# Patient Record
Sex: Male | Born: 1955 | Race: White | Hispanic: No | Marital: Married | Smoking: Never smoker
Health system: Southern US, Community
[De-identification: ages and names within clinical notes are randomized; demographics above are authoritative.]

## PROBLEM LIST (undated history)

## (undated) DIAGNOSIS — M1990 Unspecified osteoarthritis, unspecified site: Secondary | ICD-10-CM

## (undated) DIAGNOSIS — E7800 Pure hypercholesterolemia, unspecified: Secondary | ICD-10-CM

## (undated) HISTORY — PX: ROTATOR CUFF REPAIR: SHX139

## (undated) HISTORY — DX: Unspecified osteoarthritis, unspecified site: M19.90

---

## 2002-11-14 ENCOUNTER — Emergency Department (HOSPITAL_COMMUNITY): Admission: EM | Admit: 2002-11-14 | Discharge: 2002-11-14 | Payer: Self-pay | Admitting: Emergency Medicine

## 2006-10-21 ENCOUNTER — Encounter: Admission: RE | Admit: 2006-10-21 | Discharge: 2006-10-21 | Payer: Self-pay | Admitting: *Deleted

## 2016-09-04 ENCOUNTER — Emergency Department (HOSPITAL_BASED_OUTPATIENT_CLINIC_OR_DEPARTMENT_OTHER): Payer: Managed Care, Other (non HMO)

## 2016-09-04 ENCOUNTER — Encounter (HOSPITAL_BASED_OUTPATIENT_CLINIC_OR_DEPARTMENT_OTHER): Payer: Self-pay | Admitting: Emergency Medicine

## 2016-09-04 ENCOUNTER — Emergency Department (HOSPITAL_BASED_OUTPATIENT_CLINIC_OR_DEPARTMENT_OTHER)
Admission: EM | Admit: 2016-09-04 | Discharge: 2016-09-04 | Disposition: A | Discharge status: Home / Self Care | Payer: Managed Care, Other (non HMO) | Attending: Emergency Medicine | Admitting: Emergency Medicine

## 2016-09-04 DIAGNOSIS — R0789 Other chest pain: Secondary | ICD-10-CM

## 2016-09-04 DIAGNOSIS — Z79899 Other long term (current) drug therapy: Secondary | ICD-10-CM | POA: Insufficient documentation

## 2016-09-04 HISTORY — DX: Pure hypercholesterolemia, unspecified: E78.00

## 2016-09-04 LAB — BASIC METABOLIC PANEL
ANION GAP: 9 (ref 5–15)
BUN: 15 mg/dL (ref 6–20)
CALCIUM: 9.1 mg/dL (ref 8.9–10.3)
CO2: 25 mmol/L (ref 22–32)
Chloride: 102 mmol/L (ref 101–111)
Creatinine, Ser: 1.01 mg/dL (ref 0.61–1.24)
GLUCOSE: 136 mg/dL — AB (ref 65–99)
POTASSIUM: 3.6 mmol/L (ref 3.5–5.1)
Sodium: 136 mmol/L (ref 135–145)

## 2016-09-04 LAB — CBC
HEMATOCRIT: 43.8 % (ref 39.0–52.0)
HEMOGLOBIN: 15.2 g/dL (ref 13.0–17.0)
MCH: 31.2 pg (ref 26.0–34.0)
MCHC: 34.7 g/dL (ref 30.0–36.0)
MCV: 89.9 fL (ref 78.0–100.0)
Platelets: 183 10*3/uL (ref 150–400)
RBC: 4.87 MIL/uL (ref 4.22–5.81)
RDW: 13.9 % (ref 11.5–15.5)
WBC: 10.6 10*3/uL — AB (ref 4.0–10.5)

## 2016-09-04 LAB — TROPONIN I

## 2016-09-04 NOTE — ED Triage Notes (Signed)
Intermittent centralized chest pain, non-radiating for over one week.  Some dizziness and bilateral posterior tingling on both arms.  No n/V or diaphoresis.

## 2016-09-04 NOTE — ED Provider Notes (Signed)
[facility] Provider Note   CSN: [id number] Arrival date & time: 09/04/16  1044     History   Chief Complaint Chief Complaint  Patient presents with  . Chest Pain    HPI Justin Cameron is a 61 y.o. male.  61 yo M with a chief complaints of chest pain. Going on for the past week or so. Patient will feel a slight discomfort in his chest and have tingling to bilateral arms. Last for less than a minute. Has happened a total of about 10 times. Happened twice yesterday and once this morning and his wife told him you to come to the ED. He denies shortness of breath diaphoresis nausea or vomiting with this. Denies history of MI. Quit smoking 20 years ago history of hyperlipidemia denies other risk factors. No history of PE or DVT. Patient travels but no further than about 5 or 6 hours at a time. No recent surgeries no hemoptysis no leg swelling. Nothing seems to make this occur. Patient has been exercising without any symptoms.   The history is provided by the patient.  Chest Pain   This is a new problem. The current episode started more than 2 days ago. The problem occurs rarely. The problem has been resolved. The pain is present in the substernal region. The pain is at a severity of 6/10. The pain is moderate. The quality of the pain is described as brief and dull. The pain radiates to the left arm and right arm. Duration of episode(s) is 1 minute. Pertinent negatives include no abdominal pain, no fever, no headaches, no palpitations, no shortness of breath and no vomiting. He has tried nothing for the symptoms. The treatment provided no relief.  His past medical history is significant for hyperlipidemia.  Pertinent negatives for past medical history include no diabetes, no hypertension and no MI.  Pertinent negatives for family medical history include: no early MI.    Past Medical History:  Diagnosis Date  . High cholesterol     There are no active problems to display  for this patient.   Past Surgical History:  Procedure Laterality Date  . ROTATOR CUFF REPAIR         Home Medications    Prior to Admission medications   Medication Sig Start Date End Date Taking? Authorizing Provider  simvastatin (ZOCOR) 40 MG tablet Take 40 mg by mouth daily.   Yes [provider]    Family History No family history on file.  Social History Social History  Substance Use Topics  . Smoking status: Never Smoker  . Smokeless tobacco: Never Used  . Alcohol use Not on file     Allergies   Patient has no known allergies.   Review of Systems Review of Systems  Constitutional: Negative for chills and fever.  HENT: Negative for congestion and facial swelling.   Eyes: Negative for discharge and visual disturbance.  Respiratory: Positive for chest tightness. Negative for shortness of breath.   Cardiovascular: Negative for chest pain and palpitations.  Gastrointestinal: Negative for abdominal pain, diarrhea and vomiting.  Musculoskeletal: Positive for myalgias. Negative for arthralgias.  Skin: Negative for color change and rash.  Neurological: Negative for tremors, syncope and headaches.  Psychiatric/Behavioral: Negative for confusion and dysphoric mood.     Physical Exam Updated Vital Signs BP 123/72 (BP Location: Right Arm)   Pulse 68   Temp 98.5 F (36.9 C) (Oral)   Resp 12   Ht 5\' 8"  (1.727 m)  Wt 209 lb (94.8 kg)   SpO2 97%   BMI 31.78 kg/m   Physical Exam  Constitutional: He is oriented to person, place, and time. He appears well-developed and well-nourished.  HENT:  Head: Normocephalic and atraumatic.  Eyes: EOM are normal. Pupils are equal, round, and reactive to light.  Neck: Normal range of motion. Neck supple. No JVD present.  Cardiovascular: Normal rate and regular rhythm.  Exam reveals no gallop and no friction rub.   No murmur heard. Pulmonary/Chest: No respiratory distress. He has no wheezes. He exhibits no  tenderness.  Abdominal: He exhibits no distension and no mass. There is no tenderness. There is no rebound and no guarding.  Musculoskeletal: Normal range of motion.  Neurological: He is alert and oriented to person, place, and time.  Skin: No rash noted. No pallor.  Psychiatric: He has a normal mood and affect. His behavior is normal.  Nursing note and vitals reviewed.    ED Treatments / Results  Labs (all labs ordered are listed, but only abnormal results are displayed) Labs Reviewed  BASIC METABOLIC PANEL - Abnormal; Notable for the following:       Result Value   Glucose, Bld 136 (*)    All other components within normal limits  CBC - Abnormal; Notable for the following:    WBC 10.6 (*)    All other components within normal limits  TROPONIN I    EKG  EKG Interpretation  Date/Time:  Wednesday Sep 04 2016 10:53:20 EDT Ventricular Rate:  71 PR Interval:    QRS Duration: 100 QT Interval:  395 QTC Calculation: 430 R Axis:   9 Text Interpretation:  Sinus rhythm No old tracing to compare Confirmed by Bobbie Valletta MD, DANIEL [phone]) on 09/04/2016 10:59:35 AM       Radiology Dg Chest 2 View  Result Date: 09/04/2016 CLINICAL DATA:  Chest tightness and numbness in both arms with associated with dizziness for the past 8-9 days. Nonsmoker. History of hypercholesterolemia. EXAM: CHEST  2 VIEW COMPARISON:  None in PACs FINDINGS: The lungs are adequately inflated and clear. The heart and pulmonary vascularity are normal. The mediastinum is normal in width. There is calcification in the wall of the aortic arch. The trachea is midline. There is no pleural effusion. The bony thorax exhibits mild multilevel degenerative disc disease with calcification of the anterior longitudinal ligament. IMPRESSION: There is no pneumonia, CHF, nor other acute cardiopulmonary abnormality. Thoracic aortic atherosclerosis. Electronically Signed   By: David  [place] M.D.   On: 09/04/2016 11:31     Procedures Procedures (including critical care time)  Medications Ordered in ED Medications - No data to display   Initial Impression / Assessment and Plan / ED Course  I have reviewed the triage vital signs and the nursing notes.  Pertinent labs & imaging results that were available during my care of the patient were reviewed by me and considered in my medical decision making (see chart for details).     61 yo M With a chief complaint of episodic chest pain. Completely atypical for ACS. Patient had a troponin that was drawn in triage was negative. EKG and chest x-ray are also unremarkable. With the patient start Zantac follow-up with his family physician.  11:57 AM:  I have discussed the diagnosis/risks/treatment options with the patient and believe the pt to be eligible for discharge home to follow-up with PCP. We also discussed returning to the ED immediately if new or worsening sx occur. We discussed the  sx which are most concerning (e.g., sudden worsening pain, fever, inability to tolerate by mouth) that necessitate immediate return. Medications administered to the patient during their visit and any new prescriptions provided to the patient are listed below.  Medications given during this visit Medications - No data to display   The patient appears reasonably screen and/or stabilized for discharge and I doubt any other medical condition or other [facility] requiring further screening, evaluation, or treatment in the ED at this time prior to discharge.    Final Clinical Impressions(s) / ED Diagnoses   Final diagnoses:  Atypical chest pain    New Prescriptions New Prescriptions   No medications on file     Melene PlanFloyd, Tomoko Sandra, DO 09/04/16 1157

## 2016-09-04 NOTE — Discharge Instructions (Signed)
Try zantac 150mg twice a day.  ° ° °

## 2017-11-29 ENCOUNTER — Ambulatory Visit: Payer: Managed Care, Other (non HMO) | Admitting: Sports Medicine

## 2018-10-28 HISTORY — PX: REPLACEMENT TOTAL KNEE: SUR1224

## 2021-09-11 ENCOUNTER — Ambulatory Visit
Admission: RE | Admit: 2021-09-11 | Discharge: 2021-09-11 | Disposition: A | Discharge status: Home / Self Care | Payer: No Typology Code available for payment source | Source: Ambulatory Visit | Attending: Chiropractic Medicine | Admitting: Chiropractic Medicine

## 2021-09-11 ENCOUNTER — Other Ambulatory Visit: Payer: Self-pay | Admitting: Chiropractic Medicine

## 2021-09-11 DIAGNOSIS — M25662 Stiffness of left knee, not elsewhere classified: Secondary | ICD-10-CM

## 2021-09-11 DIAGNOSIS — M25552 Pain in left hip: Secondary | ICD-10-CM

## 2021-10-04 ENCOUNTER — Other Ambulatory Visit (HOSPITAL_COMMUNITY): Payer: Self-pay | Admitting: Orthopedic Surgery

## 2021-10-04 ENCOUNTER — Other Ambulatory Visit: Payer: Self-pay | Admitting: Orthopedic Surgery

## 2021-10-04 DIAGNOSIS — Z96652 Presence of left artificial knee joint: Secondary | ICD-10-CM

## 2022-01-08 ENCOUNTER — Encounter (HOSPITAL_COMMUNITY)
Admission: RE | Admit: 2022-01-08 | Discharge: 2022-01-08 | Disposition: A | Discharge status: Home / Self Care | Payer: No Typology Code available for payment source | Source: Ambulatory Visit | Attending: Orthopedic Surgery | Admitting: Orthopedic Surgery

## 2022-01-08 ENCOUNTER — Ambulatory Visit (HOSPITAL_COMMUNITY)
Admission: RE | Admit: 2022-01-08 | Discharge: 2022-01-08 | Disposition: A | Discharge status: Home / Self Care | Payer: No Typology Code available for payment source | Source: Ambulatory Visit | Attending: Orthopedic Surgery | Admitting: Orthopedic Surgery

## 2022-01-08 DIAGNOSIS — Z96652 Presence of left artificial knee joint: Secondary | ICD-10-CM | POA: Diagnosis present

## 2022-01-08 MED ORDER — TECHNETIUM TC 99M MEDRONATE IV KIT
20.0000 | PACK | Freq: Once | INTRAVENOUS | Status: AC | PRN
  Administered 2022-01-08: 20 via INTRAVENOUS

## 2022-05-03 DIAGNOSIS — Z471 Aftercare following joint replacement surgery: Secondary | ICD-10-CM | POA: Diagnosis not present

## 2022-05-28 DIAGNOSIS — Z79899 Other long term (current) drug therapy: Secondary | ICD-10-CM | POA: Diagnosis not present

## 2022-05-28 DIAGNOSIS — R7301 Impaired fasting glucose: Secondary | ICD-10-CM | POA: Diagnosis not present

## 2022-05-28 DIAGNOSIS — Z23 Encounter for immunization: Secondary | ICD-10-CM | POA: Diagnosis not present

## 2022-05-28 DIAGNOSIS — Z125 Encounter for screening for malignant neoplasm of prostate: Secondary | ICD-10-CM | POA: Diagnosis not present

## 2022-05-28 DIAGNOSIS — E538 Deficiency of other specified B group vitamins: Secondary | ICD-10-CM | POA: Diagnosis not present

## 2022-05-28 DIAGNOSIS — E782 Mixed hyperlipidemia: Secondary | ICD-10-CM | POA: Diagnosis not present

## 2022-05-28 DIAGNOSIS — Z0000 Encounter for general adult medical examination without abnormal findings: Secondary | ICD-10-CM | POA: Diagnosis not present

## 2022-05-29 ENCOUNTER — Other Ambulatory Visit: Payer: Self-pay | Admitting: Family Medicine

## 2022-05-29 DIAGNOSIS — Z0001 Encounter for general adult medical examination with abnormal findings: Secondary | ICD-10-CM

## 2022-06-25 ENCOUNTER — Ambulatory Visit
Admission: RE | Admit: 2022-06-25 | Discharge: 2022-06-25 | Disposition: A | Discharge status: Home / Self Care | Payer: Medicare Other | Source: Ambulatory Visit | Attending: Family Medicine | Admitting: Family Medicine

## 2022-06-25 DIAGNOSIS — Z0001 Encounter for general adult medical examination with abnormal findings: Secondary | ICD-10-CM

## 2022-06-25 DIAGNOSIS — Z136 Encounter for screening for cardiovascular disorders: Secondary | ICD-10-CM | POA: Diagnosis not present

## 2022-06-25 DIAGNOSIS — Z87891 Personal history of nicotine dependence: Secondary | ICD-10-CM | POA: Diagnosis not present

## 2022-07-17 DIAGNOSIS — M109 Gout, unspecified: Secondary | ICD-10-CM | POA: Diagnosis not present

## 2022-07-17 DIAGNOSIS — R5381 Other malaise: Secondary | ICD-10-CM | POA: Diagnosis not present

## 2022-07-17 DIAGNOSIS — M79672 Pain in left foot: Secondary | ICD-10-CM | POA: Diagnosis not present

## 2022-07-17 DIAGNOSIS — Z6832 Body mass index (BMI) 32.0-32.9, adult: Secondary | ICD-10-CM | POA: Diagnosis not present

## 2022-07-24 ENCOUNTER — Other Ambulatory Visit (HOSPITAL_BASED_OUTPATIENT_CLINIC_OR_DEPARTMENT_OTHER): Payer: Self-pay | Admitting: Family Medicine

## 2022-07-24 DIAGNOSIS — R109 Unspecified abdominal pain: Secondary | ICD-10-CM

## 2022-07-25 ENCOUNTER — Ambulatory Visit (HOSPITAL_COMMUNITY)
Admission: RE | Admit: 2022-07-25 | Discharge: 2022-07-25 | Disposition: A | Discharge status: Home / Self Care | Payer: Medicare Other | Source: Ambulatory Visit | Attending: Family Medicine | Admitting: Family Medicine

## 2022-07-25 DIAGNOSIS — R109 Unspecified abdominal pain: Secondary | ICD-10-CM | POA: Insufficient documentation

## 2022-08-01 ENCOUNTER — Ambulatory Visit: Payer: Medicare Other | Admitting: Podiatry

## 2022-08-01 DIAGNOSIS — M1A0720 Idiopathic chronic gout, left ankle and foot, without tophus (tophi): Secondary | ICD-10-CM

## 2022-08-01 DIAGNOSIS — Q6671 Congenital pes cavus, right foot: Secondary | ICD-10-CM | POA: Diagnosis not present

## 2022-08-01 DIAGNOSIS — M19071 Primary osteoarthritis, right ankle and foot: Secondary | ICD-10-CM | POA: Diagnosis not present

## 2022-08-01 DIAGNOSIS — Q6672 Congenital pes cavus, left foot: Secondary | ICD-10-CM | POA: Diagnosis not present

## 2022-08-01 DIAGNOSIS — M19072 Primary osteoarthritis, left ankle and foot: Secondary | ICD-10-CM

## 2022-08-01 NOTE — Progress Notes (Signed)
  Subjective:  Patient ID: Justin Cameron, male    DOB: 11-09-1955,  MRN: [id number]  Foot exam, history of midfoot pain/ possible gout   67 y.o. male presents with concern for prior pain in the bilateral foot. Also coming in for routine foot exam. Does have history of gout and has had flare ups in past. Had painful flare where both midfoot left worse than right were painful. Pain subsided with ibuprofen.   Past Medical History:  Diagnosis Date   High cholesterol     No Known Allergies  ROS: Negative except as per HPI above  Objective:  General: AAO x3, NAD  Dermatological: With inspection and palpation of the right and left lower extremities there are no open sores, no preulcerative lesions, no rash or signs of infection present. Nails are of normal length thickness and coloration.   Vascular:  Dorsalis Pedis artery and Posterior Tibial artery pedal pulses are 2/4 bilateral.  Capillary fill time < 3 sec to all digits.   Neruologic: Grossly intact via light touch bilateral. Protective threshold intact to all sites bilateral.   Musculoskeletal:  Pes cavus fooot structure noted. There is prominence osseous dorsally at the 1st TMTJ bilteral. No focal areas of pain with palpation..  Gait: Unassisted, Nonantalgic.   No images are attached to the encounter.  Radiographs:  Deferred Assessment:   1. Pes cavus of both feet   2. Arthritis of both midfeet   3. Chronic gout of left foot, unspecified cause      Plan:  Patient was evaluated and treated and all questions answered.  #Pes cavus #midfoot arthritis -Discussed with pt he does have evidence of midfoot arthirtis - Reocmmend topical antiinflammatory pain medication such as voltaren - Pain is only occasional - Recommend over the counter insert for high arch type recommend powerstep or superfeet  #prior gout attacks - monitor for recurrence. No evidence of current flare up -Diet and possible medical management for  high uric acid level  Return in about 1 year (around 08/01/2023) for annual foot exam.          Everitt Amber, [place] [facility]

## 2022-08-02 ENCOUNTER — Ambulatory Visit: Payer: Medicare Other | Admitting: Gastroenterology

## 2022-08-02 ENCOUNTER — Encounter: Payer: Self-pay | Admitting: Gastroenterology

## 2022-08-02 VITALS — BP 124/68 | HR 91 | Ht 68.0 in | Wt 200.5 lb

## 2022-08-02 DIAGNOSIS — R634 Abnormal weight loss: Secondary | ICD-10-CM | POA: Diagnosis not present

## 2022-08-02 DIAGNOSIS — R1084 Generalized abdominal pain: Secondary | ICD-10-CM

## 2022-08-02 MED ORDER — ONDANSETRON HCL 4 MG PO TABS: 4.0000 mg | ORAL_TABLET | Freq: Three times a day (TID) | ORAL | 1 refills | Status: DC | PRN

## 2022-08-02 MED ORDER — DICYCLOMINE HCL 20 MG PO TABS: 20.0000 mg | ORAL_TABLET | Freq: Four times a day (QID) | ORAL | 1 refills | Status: DC

## 2022-08-02 NOTE — Progress Notes (Unsigned)
HPI : Justin Cameron is a very pleasant 67 year old male with a history of high cholesterol who is referred to us by Dr. Gildardo Crankerharles Ross for further evaluation of generalized abdominal pain with weight loss.  The patient states that his pain started about 4 weeks ago.  It is not well localized and seems to involve his entire abdomen, but it sometimes radiates into his lower back.  It never completely goes away and is typically less bothersome in the mornings upon awakening.  He has also noted that the pain has been worse in the early part of the week, and less severe in the latter part of the week.  It it worsened with bending over. It is 6-7/10 at it's worst.  He frequently feels nauseated, but has not vomited.  The pain is worsened with eating, and so he has been eating a bland diet recently, which is better tolerated. He usually has a bowel movement every 1-2 days, but recently he went a whole week without a bowel movement.  He took a laxative (doesn't recall which) and he has been going every 3 days or so since then.  Stools are formed, but not hard.  No blood in the stool. He has lost 12lbs in the past month. He has been under a lot stress in the past few months (mother with dementia, wife with recent major surgery).  He has felt fatigued for the past year which he says his very unusual for him.  His PCP ordered a lot of blood tests which were unremarkable per the patient (results not available for my review). A RUQUS was ordered to evaluate the abdominal pain and this was normal without evidence of gallstones or biliary dilation.  No family history of GI malignancy. He reports having a colonoscopy for colon cancer screening within the past 2 years with [facility] that was normal.    RUQUS Jul 25, 2022 IMPRESSION: No cholelithiasis or sonographic evidence for acute cholecystitis.  Past Medical History:  Diagnosis Date   High cholesterol     Family History  Problem Relation Age of Onset    Atrial fibrillation Mother    Alzheimer's disease Mother    Leukemia Father    Diabetes Father    Social History   Tobacco Use   Smoking status: Never   Smokeless tobacco: Never   Current Outpatient Medications  Medication Sig Dispense Refill   simvastatin (ZOCOR) 40 MG tablet Take 40 mg by mouth daily.     No current facility-administered medications for this visit.   No Known Allergies   Review of Systems: All systems reviewed and negative except where noted in HPI.    US Abdomen Complete  Result Date: 07/25/2022 CLINICAL DATA:  Abdominal pain EXAM: ABDOMEN ULTRASOUND COMPLETE COMPARISON:  Ultrasound abdomen 06/25/2022 FINDINGS: Gallbladder: No gallstones or wall thickening visualized. No sonographic Murphy sign noted by sonographer. Common bile duct: Diameter: 2.4 mm Liver: No focal lesion identified. Within normal limits in parenchymal echogenicity. Portal vein is patent on color Doppler imaging with normal direction of blood flow towards the liver. IVC: No abnormality visualized. Pancreas: Visualized portion unremarkable. Spleen: Size and appearance within normal limits. Right Kidney: Length: 10.1 cm. Echogenicity within normal limits. No mass or hydronephrosis visualized. Left Kidney: Length: 10.1 cm. Echogenicity within normal limits. No mass or hydronephrosis visualized. Abdominal aorta: No aneurysm visualized. Other findings: None. IMPRESSION: No cholelithiasis or sonographic evidence for acute cholecystitis. Electronically Signed   By: Francis Gainesrew  Davis M.D.  On: 07/25/2022 11:55    Physical Exam: Ht 5\' 8"  (1.727 m)   Wt 200 lb 8 oz (90.9 kg)   BMI 30.49 kg/m  Constitutional: Pleasant,well-developed, Caucasian male in no acute distress. HEENT: Normocephalic and atraumatic. Conjunctivae are normal. No scleral icterus. Neck supple.  Cardiovascular: Normal rate, regular rhythm.  Pulmonary/chest: Effort normal and breath sounds normal. No wheezing, rales or  rhonchi. Abdominal: Soft, nondistended, multifocal tenderness to palpation in the epigastrium, LUQ, RUQ and RLQ without rigidity or guarding. Bowel sounds active throughout. There are no masses palpable. No hepatomegaly. Extremities: no edema Neurological: Alert and oriented to person place and time. Skin: Skin is warm and dry. No rashes noted. Psychiatric: Normal mood and affect. Behavior is normal.  CBC    Component Value Date/Time   WBC 10.6 (H) 09/04/2016 1108   RBC 4.87 09/04/2016 1108   HGB 15.2 09/04/2016 1108   HCT 43.8 09/04/2016 1108   PLT 183 09/04/2016 1108   MCV 89.9 09/04/2016 1108   MCH 31.2 09/04/2016 1108   MCHC 34.7 09/04/2016 1108   RDW 13.9 09/04/2016 1108    CMP     Component Value Date/Time   NA 136 09/04/2016 1108   K 3.6 09/04/2016 1108   CL 102 09/04/2016 1108   CO2 25 09/04/2016 1108   GLUCOSE 136 (H) 09/04/2016 1108   BUN 15 09/04/2016 1108   CREATININE 1.01 09/04/2016 1108   CALCIUM 9.1 09/04/2016 1108   GFRNONAA >60 09/04/2016 1108   GFRAA >60 09/04/2016 1108     ASSESSMENT AND PLAN: 67 year old male with 1 month of generalized abdominal pain, poor appetite and 12lb unintentional weight loss.  He has had less frequent stools and has had a lot of stress recently related to caring for his mother and wife.  I suspect his symptoms are most likely secondary to constipation and stress, but given new onset abdominal pain and unintentional weight loss, further evaluation to exclude malignancy is warranted.  Given he had a colonoscopy within 2 years, colon cancer is very unlikely.  Will plan for EGD to exclude gastric cancer/PUD/H. Pylori and CT abdomen/pelvis to exclude pancreatic CA or other intra-abdominal mass. I recommended he try a therapeutic purge prep to clear his colon and see if this would improve his pain and appetite, and then continue to take MiraLax daily. Will prescribed bentyl and zofran to help with pain and nausea.  Abdominal pain,  weight loss - EGD - CT abdomen/pelvis - MiraLax Purge prep, followed by daily MiraLax - Bentyl, zofran PRN for pain and nausea.  The details, risks (including bleeding, perforation, infection, missed lesions, medication reactions and possible hospitalization or surgery if complications occur), benefits, and alternatives to EGD with possible biopsy and possible dilation were discussed with the patient and he consents to proceed.    Henri Guedes E. Tomasa Rand, MD [facility]   CC:  Daisy Floro, MD

## 2022-08-02 NOTE — Patient Instructions (Addendum)
_______________________________________________________  If your blood pressure at your visit was 140/90 or greater, please contact your primary care physician to follow up on this.  Dr Tomasa Rand recommends that you complete a bowel purge (to clean out your bowels). Please do the following: Purchase a bottle of Miralax over the counter as well as a box of 5 mg dulcolax tablets. Take 4 dulcolax tablets. Wait 1 hour. You will then drink 6-8 capfuls of Miralax mixed in an adequate amount of water/juice/gatorade (you may choose which of these liquids to drink) over the next 2-3 hours. You should expect results within 1 to 6 hours after completing the bowel purge.\    If you are age 66 or older, your body mass index should be between 23-30. Your Body mass index is 30.49 kg/m. If this is out of the aforementioned range listed, please consider follow up with your Primary Care Provider.  If you are age 22 or younger, your body mass index should be between 19-25. Your Body mass index is 30.49 kg/m. If this is out of the aformentioned range listed, please consider follow up with your Primary Care Provider.   You have been scheduled for an endoscopy. Please follow written instructions given to you at your visit today. If you use inhalers (even only as needed), please bring them with you on the day of your procedure.   You have been scheduled for a CT scan of the abdomen and pelvis at [facility] 1st floor Radiology. You are scheduled on 08/12/22 at 9:30am. You should arrive 15 minutes prior to your appointment time for registration.  Please follow the written instructions below on the day of your exam:   1) Do not eat anything after 5:30am (4 hours prior to your test)   You may take any medications as prescribed with a small amount of water, if necessary. If you take any of the following medications: METFORMIN, GLUCOPHAGE, GLUCOVANCE, AVANDAMET, RIOMET, FORTAMET, ACTOPLUS MET, JANUMET,  GLUMETZA or METAGLIP, you MAY be asked to HOLD this medication 48 hours AFTER the exam.   If you have any questions regarding your exam or if you need to reschedule, you may call [facility] at [phone] between the hours of 8:00 am and 5:00 pm, Monday-Friday.   The [facility] providers would like to encourage you to use [facility] to communicate with providers for non-urgent requests or questions.  Due to long hold times on the telephone, sending your provider a message by [facility] may be a faster and more efficient way to get a response.  Please allow 48 business hours for a response.  Please remember that this is for non-urgent requests.   It was a pleasure to see you today!  Thank you for trusting me with your gastrointestinal care!    Scott E.Tomasa Rand, MD

## 2022-08-06 ENCOUNTER — Telehealth: Payer: Self-pay | Admitting: Pharmacy Technician

## 2022-08-06 NOTE — Telephone Encounter (Signed)
Patient Advocate Encounter  Received notification from [facility] that prior authorization for DICYCLOMINE 20MG  is required.   PA submitted on 4.9.24 Key [id number] Status is pending

## 2022-08-08 NOTE — Telephone Encounter (Signed)
PA has been APPROVED. Approval letter has been attached in patients documents. 

## 2022-08-12 ENCOUNTER — Encounter (HOSPITAL_BASED_OUTPATIENT_CLINIC_OR_DEPARTMENT_OTHER): Payer: Self-pay

## 2022-08-12 ENCOUNTER — Ambulatory Visit (HOSPITAL_BASED_OUTPATIENT_CLINIC_OR_DEPARTMENT_OTHER)
Admission: RE | Admit: 2022-08-12 | Discharge: 2022-08-12 | Disposition: A | Discharge status: Home / Self Care | Payer: Medicare Other | Source: Ambulatory Visit | Attending: Gastroenterology | Admitting: Gastroenterology

## 2022-08-12 DIAGNOSIS — R1084 Generalized abdominal pain: Secondary | ICD-10-CM | POA: Insufficient documentation

## 2022-08-12 DIAGNOSIS — R634 Abnormal weight loss: Secondary | ICD-10-CM

## 2022-08-12 DIAGNOSIS — K7689 Other specified diseases of liver: Secondary | ICD-10-CM | POA: Diagnosis not present

## 2022-08-12 DIAGNOSIS — K5730 Diverticulosis of large intestine without perforation or abscess without bleeding: Secondary | ICD-10-CM | POA: Diagnosis not present

## 2022-08-12 MED ORDER — IOHEXOL 300 MG/ML  SOLN
100.0000 mL | Freq: Once | INTRAMUSCULAR | Status: AC | PRN
  Administered 2022-08-12: 75 mL via INTRAVENOUS

## 2022-08-13 ENCOUNTER — Other Ambulatory Visit: Payer: Self-pay

## 2022-08-13 DIAGNOSIS — R935 Abnormal findings on diagnostic imaging of other abdominal regions, including retroperitoneum: Secondary | ICD-10-CM

## 2022-08-13 DIAGNOSIS — Z1211 Encounter for screening for malignant neoplasm of colon: Secondary | ICD-10-CM

## 2022-08-13 MED ORDER — NA SULFATE-K SULFATE-MG SULF 17.5-3.13-1.6 GM/177ML PO SOLN: 1.0000 | Freq: Once | ORAL | 0 refills | Status: AC

## 2022-08-13 NOTE — Progress Notes (Signed)
I spoke with the patient on the phone today regarding his CT results, which are very concerning for colon cancer.  He has an EGD scheduled for April 24th.  Based on the CT findings, I recommended we change this procedure to a colonoscopy.   Will also request colonoscopy report again from [facility]. Patient indicated understanding that his CT was very suspicious for colon cancer and that we need to do a colonoscopy.  He will be contacted by our staff to go over pre-procedure instructions for a colonoscopy.  Plan for colonoscopy April 24th.

## 2022-08-14 ENCOUNTER — Encounter: Payer: Self-pay | Admitting: Gastroenterology

## 2022-08-16 ENCOUNTER — Telehealth: Payer: Self-pay

## 2022-08-16 ENCOUNTER — Other Ambulatory Visit: Payer: Self-pay | Admitting: Gastroenterology

## 2022-08-16 NOTE — Telephone Encounter (Signed)
Called patient and went over prep instructions.I let patient know to call and check on prep that was sent to his pharmacy. Patient stated that if he has any questions he would call.

## 2022-08-21 ENCOUNTER — Ambulatory Visit (AMBULATORY_SURGERY_CENTER): Payer: Medicare Other | Admitting: Gastroenterology

## 2022-08-21 ENCOUNTER — Other Ambulatory Visit (INDEPENDENT_AMBULATORY_CARE_PROVIDER_SITE_OTHER): Payer: Medicare Other

## 2022-08-21 ENCOUNTER — Other Ambulatory Visit: Payer: Self-pay

## 2022-08-21 ENCOUNTER — Encounter: Payer: Self-pay | Admitting: Gastroenterology

## 2022-08-21 VITALS — BP 116/72 | HR 92 | Temp 98.6°F | Resp 14 | Ht 68.0 in | Wt 200.0 lb

## 2022-08-21 DIAGNOSIS — R1084 Generalized abdominal pain: Secondary | ICD-10-CM

## 2022-08-21 DIAGNOSIS — R933 Abnormal findings on diagnostic imaging of other parts of digestive tract: Secondary | ICD-10-CM | POA: Diagnosis not present

## 2022-08-21 DIAGNOSIS — R935 Abnormal findings on diagnostic imaging of other abdominal regions, including retroperitoneum: Secondary | ICD-10-CM | POA: Diagnosis not present

## 2022-08-21 DIAGNOSIS — K633 Ulcer of intestine: Secondary | ICD-10-CM

## 2022-08-21 DIAGNOSIS — Z1211 Encounter for screening for malignant neoplasm of colon: Secondary | ICD-10-CM

## 2022-08-21 DIAGNOSIS — K6389 Other specified diseases of intestine: Secondary | ICD-10-CM

## 2022-08-21 LAB — COMPREHENSIVE METABOLIC PANEL
ALT: 8 U/L (ref 0–53)
AST: 16 U/L (ref 0–37)
Albumin: 3.6 g/dL (ref 3.5–5.2)
Alkaline Phosphatase: 87 U/L (ref 39–117)
BUN: 9 mg/dL (ref 6–23)
CO2: 28 mEq/L (ref 19–32)
Calcium: 9 mg/dL (ref 8.4–10.5)
Chloride: 99 mEq/L (ref 96–112)
Creatinine, Ser: 0.99 mg/dL (ref 0.40–1.50)
GFR: 79.73 mL/min (ref >60.00)
Glucose, Bld: 102 mg/dL — ABNORMAL HIGH (ref 70–99)
Potassium: 4.9 mEq/L (ref 3.5–5.1)
Sodium: 137 mEq/L (ref 135–145)
Total Bilirubin: 0.9 mg/dL (ref 0.2–1.2)
Total Protein: 6.9 g/dL (ref 6.0–8.3)

## 2022-08-21 LAB — CBC WITH DIFFERENTIAL/PLATELET
Basophils Absolute: 0.1 10*3/uL (ref 0.0–0.1)
Basophils Relative: 0.9 % (ref 0.0–3.0)
Eosinophils Absolute: 0.2 10*3/uL (ref 0.0–0.7)
Eosinophils Relative: 2.3 % (ref 0.0–5.0)
HCT: 40.6 % (ref 39.0–52.0)
Hemoglobin: 13.4 g/dL (ref 13.0–17.0)
Lymphocytes Relative: 11.3 % — ABNORMAL LOW (ref 12.0–46.0)
Lymphs Abs: 1.2 10*3/uL (ref 0.7–4.0)
MCHC: 33 g/dL (ref 30.0–36.0)
MCV: 80 fl (ref 78.0–100.0)
Monocytes Absolute: 1 10*3/uL (ref 0.1–1.0)
Monocytes Relative: 8.8 % (ref 3.0–12.0)
Neutro Abs: 8.4 10*3/uL — ABNORMAL HIGH (ref 1.4–7.7)
Neutrophils Relative %: 76.7 % (ref 43.0–77.0)
Platelets: 340 10*3/uL (ref 150.0–400.0)
RBC: 5.08 Mil/uL (ref 4.22–5.81)
RDW: 15.1 % (ref 11.5–15.5)
WBC: 11 10*3/uL — ABNORMAL HIGH (ref 4.0–10.5)

## 2022-08-21 MED ORDER — SODIUM CHLORIDE 0.9 % IV SOLN
500.0000 mL | Freq: Once | INTRAVENOUS | Status: DC
Start: 2022-08-21 — End: 2022-08-21

## 2022-08-21 NOTE — Patient Instructions (Addendum)
Labs today.  Perform CT scan of the chest with contrast at appointment to be scheduled.  Referral placed for colo-rectal surgeon and oncologist, appointments will be scheduled.   YOU HAD AN ENDOSCOPIC PROCEDURE TODAY AT THE [facility]:   Refer to the procedure report that was given to you for any specific questions about what was found during the examination.  If the procedure report does not answer your questions, please call your gastroenterologist to clarify.  If you requested that your care partner not be given the details of your procedure findings, then the procedure report has been included in a sealed envelope for you to review at your convenience later.  YOU SHOULD EXPECT: Some feelings of bloating in the abdomen. Passage of more gas than usual.  Walking can help get rid of the air that was put into your GI tract during the procedure and reduce the bloating. If you had a lower endoscopy (such as a colonoscopy or flexible sigmoidoscopy) you may notice spotting of blood in your stool or on the toilet paper. If you underwent a bowel prep for your procedure, you may not have a normal bowel movement for a few days.  Please Note:  You might notice some irritation and congestion in your nose or some drainage.  This is from the oxygen used during your procedure.  There is no need for concern and it should clear up in a day or so.  SYMPTOMS TO REPORT IMMEDIATELY:  Following lower endoscopy (colonoscopy or flexible sigmoidoscopy):  Excessive amounts of blood in the stool  Significant tenderness or worsening of abdominal pains  Swelling of the abdomen that is new, acute  Fever of 100F or higher  For urgent or emergent issues, a gastroenterologist can be reached at any hour by calling [phone]. Do not use MyChart messaging for urgent concerns.    DIET:  We do recommend a small meal at first, but then you may proceed to your regular diet.  Drink plenty of fluids but you  should avoid alcoholic beverages for 24 hours.  ACTIVITY:  You should plan to take it easy for the rest of today and you should NOT DRIVE or use heavy machinery until tomorrow (because of the sedation medicines used during the test).    FOLLOW UP: Our staff will call the number listed on your records the next business day following your procedure.  We will call around 7:15- 8:00 am to check on you and address any questions or concerns that you may have regarding the information given to you following your procedure. If we do not reach you, we will leave a message.     If any biopsies were taken you will be contacted by phone or by letter within the next 1-3 weeks.  Please call us at [phone] if you have not heard about the biopsies in 3 weeks.    SIGNATURES/CONFIDENTIALITY: You and/or your care partner have signed paperwork which will be entered into your electronic medical record.  These signatures attest to the fact that that the information above on your After Visit Summary has been reviewed and is understood.  Full responsibility of the confidentiality of this discharge information lies with you and/or your care-partner.

## 2022-08-21 NOTE — Progress Notes (Signed)
Pt's states no medical or surgical changes since previsit or office visit. 

## 2022-08-21 NOTE — Progress Notes (Signed)
History and Physical Interval Note:  08/21/2022 10:35 AM  Justin Cameron  has presented today for endoscopic procedure(s), with the diagnosis of  Encounter Diagnoses  Name Primary?   Generalized abdominal pain Yes   Screening for colon cancer   .  The various methods of evaluation and treatment have been discussed with the patient and/or family. After consideration of risks, benefits and other options for treatment, the patient has consented to  the endoscopic procedure(s).   The patient's history has been reviewed, patient examined, no change in status, stable for endoscopic procedure(s).  I have reviewed the patient's chart and labs.  Questions were answered to the patient's satisfaction.     Kagan Hietpas E. Tomasa Rand, MD [facility]

## 2022-08-21 NOTE — Progress Notes (Signed)
Called to room to assist during endoscopic procedure.  Patient ID and intended procedure confirmed with present staff. Received instructions for my participation in the procedure from the performing physician.  

## 2022-08-21 NOTE — Op Note (Signed)
[facility] Patient Name: Justin Cameron Procedure Date: 08/21/2022 10:35 AM MRN: [id number] Endoscopist: Lorin Picket E. Tomasa Rand , MD, [id number] Age: 67 Referring MD:  Date of Birth: 12/04/1955 Gender: Male Account #: [id number] Procedure:                Colonoscopy Indications:              Generalized abdominal pain, Abnormal CT of the GI                            tract, Weight loss Medicines:                Monitored Anesthesia Care Procedure:                Pre-Anesthesia Assessment:                           - Prior to the procedure, a History and Physical                            was performed, and patient medications and                            allergies were reviewed. The patient's tolerance of                            previous anesthesia was also reviewed. The risks                            and benefits of the procedure and the sedation                            options and risks were discussed with the patient.                            All questions were answered, and informed consent                            was obtained. Prior Anticoagulants: The patient has                            taken no anticoagulant or antiplatelet agents. ASA                            Grade Assessment: II - A patient with mild systemic                            disease. After reviewing the risks and benefits,                            the patient was deemed in satisfactory condition to                            undergo the procedure.  After obtaining informed consent, the colonoscope                            was passed under direct vision. Throughout the                            procedure, the patient's blood pressure, pulse, and                            oxygen saturations were monitored continuously. The                            Olympus CF-HQ190L [id number]) Colonoscope was                            introduced through the anus and  advanced to the the                            cecum, identified by appendiceal orifice and                            ileocecal valve. The colonoscopy was performed                            without difficulty. The patient tolerated the                            procedure well. The quality of the bowel                            preparation was adequate. The ileocecal valve,                            appendiceal orifice, and rectum were photographed.                            The bowel preparation used was SUPREP via split                            dose instruction. Scope In: 10:54:44 AM Scope Out: 11:15:46 AM Scope Withdrawal Time: 0 hours 12 minutes 55 seconds  Total Procedure Duration: 0 hours 21 minutes 2 seconds  Findings:                 The perianal and digital rectal examinations were                            normal. Pertinent negatives include normal                            sphincter tone and no palpable rectal lesions.                           A very large, ulcerated partially obstructing large  mass was found at the hepatic flexure. The mass was                            circumferential and the area involving the middle                            portion of the mass was excavated, with no                            discernable normal mucosa. There was adherent stool                            covering much of the lesion that would not rinse                            well with water lavage. There appeared to be areas                            of fistulization/tracts which were not probed or                            explored. The mass measured seven cm in length. No                            bleeding was present. Biopsies were taken with a                            cold forceps for histology. The lesion was friable.                            In some areas the lesion was very hard, while in                            others it was  quite friable. Estimated blood loss                            was minimal. Area was tattooed with an injection of                            3 mL of Spot (carbon black) 2-3 distal to the mass.                            Estimated blood loss was minimal.                           Multiple medium-mouthed and small-mouthed                            diverticula were found in the sigmoid colon and                            descending colon. There was  no evidence of                            diverticular bleeding.                           The exam was otherwise normal throughout the                            examined colon.                           The retroflexed view of the distal rectum and anal                            verge was normal and showed no anal or rectal                            abnormalities. Complications:            No immediate complications. Estimated Blood Loss:     Estimated blood loss was minimal. 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 Biopsied.                            Tattooed.                           - Moderate diverticulosis in the sigmoid colon and                            in the descending colon. There was no evidence of                            diverticular bleeding.                           - The GI Genius (intelligent endoscopy module),                            computer-aided polyp detection system powered by AI                            was utilized to detect colorectal polyps through                            enhanced visualization during colonoscopy. Recommendation:           - Patient has a contact number available for  emergencies. The signs and symptoms of potential                            delayed complications were discussed with  the                            patient. Return to normal activities tomorrow.                            Written discharge instructions were provided to the                            patient.                           - Resume previous diet.                           - Continue present medications.                           - Await pathology results.                           - Refer to a colo-rectal surgeon at appointment to                            be scheduled.                           - Refer to an oncologist at appointment to be                            scheduled.                           - Perform CT scan (computed tomography) of the                            chest with contrast at appointment to be scheduled.                           - Check CBC, CMP, CEA. Anisah Kuck E. Tomasa Rand, MD 08/21/2022 11:30:36 AM This report has been signed electronically.

## 2022-08-21 NOTE — Progress Notes (Signed)
Report to PACU, RN, vss, BBS= Clear.  

## 2022-08-22 ENCOUNTER — Encounter: Payer: Self-pay | Admitting: *Deleted

## 2022-08-22 ENCOUNTER — Other Ambulatory Visit: Payer: Self-pay

## 2022-08-22 ENCOUNTER — Ambulatory Visit (HOSPITAL_COMMUNITY)
Admission: RE | Admit: 2022-08-22 | Discharge: 2022-08-22 | Disposition: A | Discharge status: Home / Self Care | Payer: Medicare Other | Source: Ambulatory Visit | Attending: Gastroenterology | Admitting: Gastroenterology

## 2022-08-22 ENCOUNTER — Telehealth: Payer: Self-pay

## 2022-08-22 DIAGNOSIS — I700 Atherosclerosis of aorta: Secondary | ICD-10-CM | POA: Insufficient documentation

## 2022-08-22 DIAGNOSIS — K6389 Other specified diseases of intestine: Secondary | ICD-10-CM

## 2022-08-22 DIAGNOSIS — K7689 Other specified diseases of liver: Secondary | ICD-10-CM | POA: Diagnosis not present

## 2022-08-22 DIAGNOSIS — J8410 Pulmonary fibrosis, unspecified: Secondary | ICD-10-CM | POA: Insufficient documentation

## 2022-08-22 DIAGNOSIS — M47814 Spondylosis without myelopathy or radiculopathy, thoracic region: Secondary | ICD-10-CM | POA: Insufficient documentation

## 2022-08-22 DIAGNOSIS — R918 Other nonspecific abnormal finding of lung field: Secondary | ICD-10-CM | POA: Diagnosis not present

## 2022-08-22 DIAGNOSIS — C189 Malignant neoplasm of colon, unspecified: Secondary | ICD-10-CM | POA: Diagnosis not present

## 2022-08-22 LAB — CEA: CEA: <2 ng/mL

## 2022-08-22 MED ORDER — IOHEXOL 300 MG/ML  SOLN
75.0000 mL | Freq: Once | INTRAMUSCULAR | Status: AC | PRN
  Administered 2022-08-22: 100 mL via INTRAVENOUS

## 2022-08-22 NOTE — Telephone Encounter (Signed)
Left VM for [facility] medical records explaining that we have requested medial records multiple times. Patient has contacted [facility] and have even went to there office to pick up records and they have not received them.

## 2022-08-22 NOTE — Progress Notes (Signed)
PATIENT NAVIGATOR PROGRESS NOTE  Name: Justin Cameron Date: 08/22/2022 MRN: [name]   Reason for visit:  New Patient appt  Comments:  Called and spoke with pt and introduced myself as Nurse Navigator and gave contact information He is having staging CT Chest today at 1400 at [facility] Awaiting pathology results from colonoscopy from 4/24 He is waiting for call from [facility] for appt with surgeon.  Will continue to monitor and encouraged patient to call with any questions    Time spent counseling/coordinating care: 45-60 minutes

## 2022-08-22 NOTE — Addendum Note (Signed)
Addended by: Justice Britain on: 08/22/2022 10:14 AM   Modules accepted: Orders

## 2022-08-22 NOTE — Telephone Encounter (Signed)
Post procedure follow up call, left message 

## 2022-08-22 NOTE — Telephone Encounter (Signed)
Patient brought records to office 08/22/22.

## 2022-08-27 NOTE — Telephone Encounter (Signed)
Records have been scanned into Media

## 2022-08-28 ENCOUNTER — Encounter: Payer: Self-pay | Admitting: Gastroenterology

## 2022-08-30 ENCOUNTER — Encounter: Payer: Self-pay | Admitting: Gastroenterology

## 2022-09-04 ENCOUNTER — Encounter: Payer: Self-pay | Admitting: Gastroenterology

## 2022-09-04 ENCOUNTER — Other Ambulatory Visit: Payer: Self-pay

## 2022-09-04 DIAGNOSIS — R935 Abnormal findings on diagnostic imaging of other abdominal regions, including retroperitoneum: Secondary | ICD-10-CM

## 2022-09-04 DIAGNOSIS — K6389 Other specified diseases of intestine: Secondary | ICD-10-CM

## 2022-09-04 NOTE — Progress Notes (Signed)
The proposed treatment discussed in conference is for discussion purpose only and is not a binding recommendation.  The patients have not been physically examined, or presented with their treatment options.  Therefore, final treatment plans cannot be decided.  

## 2022-09-05 ENCOUNTER — Ambulatory Visit (AMBULATORY_SURGERY_CENTER): Payer: Medicare Other | Admitting: Gastroenterology

## 2022-09-05 ENCOUNTER — Encounter: Payer: Self-pay | Admitting: Gastroenterology

## 2022-09-05 VITALS — BP 116/67 | HR 98 | Temp 98.6°F | Resp 16 | Ht 68.0 in | Wt 200.0 lb

## 2022-09-05 DIAGNOSIS — D123 Benign neoplasm of transverse colon: Secondary | ICD-10-CM

## 2022-09-05 DIAGNOSIS — K6389 Other specified diseases of intestine: Secondary | ICD-10-CM | POA: Diagnosis not present

## 2022-09-05 DIAGNOSIS — R1084 Generalized abdominal pain: Secondary | ICD-10-CM | POA: Diagnosis not present

## 2022-09-05 DIAGNOSIS — R933 Abnormal findings on diagnostic imaging of other parts of digestive tract: Secondary | ICD-10-CM | POA: Diagnosis not present

## 2022-09-05 DIAGNOSIS — K5669 Other partial intestinal obstruction: Secondary | ICD-10-CM

## 2022-09-05 MED ORDER — SODIUM CHLORIDE 0.9 % IV SOLN
500.0000 mL | Freq: Once | INTRAVENOUS | Status: DC
Start: 2022-09-05 — End: 2022-09-05

## 2022-09-05 NOTE — Op Note (Signed)
[facility] Patient Name: Justin Cameron Procedure Date: 09/05/2022 3:46 PM MRN: [id number] Endoscopist: Lorin Picket E. Tomasa Cameron , MD, [id number] Age: 67 Referring MD:  Date of Birth: 1955-05-26 Gender: Male Account #: [id number] Procedure:                Colonoscopy Indications:              Suspected colon cancer Medicines:                Monitored Anesthesia Care Procedure:                Pre-Anesthesia Assessment:                           - Prior to the procedure, a History and Physical                            was performed, and patient medications and                            allergies were reviewed. The patient's tolerance of                            previous anesthesia was also reviewed. The risks                            and benefits of the procedure and the sedation                            options and risks were discussed with the patient.                            All questions were answered, and informed consent                            was obtained. Prior Anticoagulants: The patient has                            taken no anticoagulant or antiplatelet agents. ASA                            Grade Assessment: III - A patient with severe                            systemic disease. After reviewing the risks and                            benefits, the patient was deemed in satisfactory                            condition to undergo the procedure.                           After obtaining informed consent, the colonoscope  was passed under direct vision. Throughout the                            procedure, the patient's blood pressure, pulse, and                            oxygen saturations were monitored continuously. The                            CF HQ190L #[id number] was introduced through the anus                            and advanced to the the cecum, identified by                            appendiceal orifice and  ileocecal valve. The                            colonoscopy was performed without difficulty. The                            patient tolerated the procedure well. The quality                            of the bowel preparation was adequate. The                            ileocecal valve, appendiceal orifice, and rectum                            were photographed. Scope In: 4:06:56 PM Scope Out: 4:30:01 PM Scope Withdrawal Time: 0 hours 15 minutes 49 seconds  Total Procedure Duration: 0 hours 23 minutes 5 seconds  Findings:                 The perianal and digital rectal examinations were                            normal. Pertinent negatives include normal                            sphincter tone and no palpable rectal lesions.                           An ulcerated partially obstructing large mass was                            again found at the hepatic flexure. The mass was                            circumferential. The mass measured about 7-8 cm in                            length. No bleeding was present although there  appeared to be old clots. There was copious                            adherent stool/debris which interfered with                            visualization and was not amenable to                            lavage/suction, but the underlying mucosa was                            better visualized this time compared to last time.                            This was biopsied with a cold forceps for                            histology. Estimated blood loss was minimal. Twenty                            biopsies were taken from the proximal margin, the                            visible mucosa just distal to the proximal margin,                            some of the ulcerated areas within the lesion, and                            the distal margin of the mass. Once again the                            lesion appeared cavitary with portions  of the                            lesion suspicion for fistula formation                           The exam was otherwise normal throughout the                            examined colon.                           The retroflexed view of the distal rectum and anal                            verge was normal and showed no anal or rectal                            abnormalities. Complications:            No immediate complications. Estimated Blood Loss:  Estimated blood loss was minimal. 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 Biopsied.                           - The distal rectum and anal verge are normal on                            retroflexion view. Recommendation:           - Patient has a contact number available for                            emergencies. The signs and symptoms of potential                            delayed complications were discussed with the                            patient. Return to normal activities tomorrow.                            Written discharge instructions were provided to the                            patient.                           - Resume previous diet.                           - Continue present medications.                           - Await pathology results.                           - Follow up with Dr. Maisie Fus to discuss surgery. Justin Rebert E. Tomasa Rand, MD 09/05/2022 4:52:32 PM This report has been signed electronically.

## 2022-09-05 NOTE — Op Note (Signed)
[facility] Patient Name: Justin Cameron Procedure Date: 09/05/2022 3:46 PM MRN: [id number] Endoscopist: Lorin Picket E. Tomasa Rand , MD, [id number] Age: 67 Referring MD:  Date of Birth: 1955/05/09 Gender: Male Account #: [id number] Procedure:                Upper GI endoscopy Indications:              Abnormal CT of the GI tract; colonic mass with                            close approximation to duodenum; EGD to exclude                            duodenal involvement prior to surgery Medicines:                Monitored Anesthesia Care Procedure:                Pre-Anesthesia Assessment:                           - Prior to the procedure, a History and Physical                            was performed, and patient medications and                            allergies were reviewed. The patient's tolerance of                            previous anesthesia was also reviewed. The risks                            and benefits of the procedure and the sedation                            options and risks were discussed with the patient.                            All questions were answered, and informed consent                            was obtained. Prior Anticoagulants: The patient has                            taken no anticoagulant or antiplatelet agents. ASA                            Grade Assessment: III - A patient with severe                            systemic disease. After reviewing the risks and                            benefits, the patient was deemed in satisfactory  condition to undergo the procedure.                           After obtaining informed consent, the endoscope was                            passed under direct vision. Throughout the                            procedure, the patient's blood pressure, pulse, and                            oxygen saturations were monitored continuously. The                            GIF HQ190  #[id number] was introduced through the                            mouth, and advanced to the third part of duodenum.                            The upper GI endoscopy was accomplished without                            difficulty. The patient tolerated the procedure                            well. Scope In: Scope Out: Findings:                 The examined esophagus was normal.                           The entire examined stomach was normal.                           Localized mucosal abnormality (slightly raised with                            linear erythema) was found in the third portion of                            the duodenum. The abnormality was about 1 cm in                            size. Biopsies were taken with a cold forceps for                            histology. Estimated blood loss was minimal.                           The exam of the duodenum was otherwise normal. Complications:            No immediate complications. Estimated Blood Loss:     Estimated blood loss was minimal. Impression:               -  Normal esophagus.                           - Normal stomach.                           - Localized mucosal abnormality in the third                            portion of the duodenum. Biopsied.                           - No evidence of luminal obstruction from the                            colonic mass, or disruption of the duodenal mucosa. Recommendation:           - Patient has a contact number available for                            emergencies. The signs and symptoms of potential                            delayed complications were discussed with the                            patient. Return to normal activities tomorrow.                            Written discharge instructions were provided to the                            patient.                           - Resume previous diet.                           - Continue present medications.                            - Await pathology results.                           - Follow up with Dr. Maisie Fus. [name], MD 09/05/2022 4:39:14 PM This report has been signed electronically.

## 2022-09-05 NOTE — Progress Notes (Signed)
[facility] History and Physical   Primary Care Physician:  Daisy Floro, MD   Reason for Procedure:   Repeat colonoscopy due to indeterminate colon biopsies; EGD to exclude duodenal involvement of colonic mass  Plan:    EGD/colonoscopy     HPI: AUNDREY EHRHARDT is a 67 y.o. male undergoing EGD and colonoscopy to evaluate large ulcerated colonic lesion biopsied April 24th with indeterminate histology (ulcerated mucosa).  High suspicion for malignancy based on appearance.  Case discussed at multidisciplinary conference yesterday with recommendations to repeat colonoscopy with biopsies and perform EGD to exclude duodenal involvement, based on CT showing close proximity of lesion to duodenum.  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 Authorizing Provider  dicyclomine (BENTYL) 20 MG tablet TAKE 1 TABLET(20 MG) BY MOUTH EVERY 6 HOURS 08/19/22  Yes Jenel Lucks, MD  diphenhydramine-acetaminophen (TYLENOL PM) 25-500 MG TABS tablet Take 1 tablet by mouth at bedtime.   Yes [provider]  ondansetron (ZOFRAN) 4 MG tablet TAKE 1 TABLET(4 MG) BY MOUTH EVERY 8 HOURS AS NEEDED FOR NAUSEA OR VOMITING 08/19/22  Yes Jenel Lucks, MD  simvastatin (ZOCOR) 20 MG tablet Take 20 mg by mouth.   Yes [provider]  traZODone (DESYREL) 50 MG tablet Take 50-100 mg by mouth at bedtime as needed. 07/26/22  Yes [provider]  ALPRAZolam (XANAX) 0.25 MG tablet Take 0.125-0.25 mg by mouth 3 (three) times daily.    [provider]  colchicine 0.6 MG tablet Take 0.6 mg by mouth daily. 07/19/22   [provider]    Current Outpatient Medications  Medication Sig Dispense Refill   dicyclomine (BENTYL) 20 MG tablet  TAKE 1 TABLET(20 MG) BY MOUTH EVERY 6 HOURS 90 tablet 3   diphenhydramine-acetaminophen (TYLENOL PM) 25-500 MG TABS tablet Take 1 tablet by mouth at bedtime.     ondansetron (ZOFRAN) 4 MG tablet TAKE 1 TABLET(4 MG) BY MOUTH EVERY 8 HOURS AS NEEDED FOR NAUSEA OR VOMITING 90 tablet 3   simvastatin (ZOCOR) 20 MG tablet Take 20 mg by mouth.     traZODone (DESYREL) 50 MG tablet Take 50-100 mg by mouth at bedtime as needed.     ALPRAZolam (XANAX) 0.25 MG tablet Take 0.125-0.25 mg by mouth 3 (three) times daily.     colchicine 0.6 MG tablet Take 0.6 mg by mouth daily.     Current Facility-Administered Medications  Medication Dose Route Frequency Provider Last Rate Last Admin   0.9 %  sodium chloride infusion  500 mL Intravenous Once Jenel Lucks, MD        Allergies as of 09/05/2022   (No Known Allergies)    Family History  Problem Relation Age of Onset   Atrial fibrillation Mother    Alzheimer's disease Mother    Leukemia Father    Diabetes Father    Colon cancer Neg Hx    Rectal cancer Neg Hx    Esophageal cancer Neg Hx    Stomach cancer Neg Hx     Social History   Socioeconomic History   Marital status: Married    Spouse name: Not on file   Number of children: Not on file   Years of education:  Not on file   Highest education level: Not on file  Occupational History   Not on file  Tobacco Use   Smoking status: Never   Smokeless tobacco: Never  Vaping Use   Vaping Use: Never used  Substance and Sexual Activity   Alcohol use: Not Currently   Drug use: Not Currently   Sexual activity: Not on file  Other Topics Concern   Not on file  Social History Narrative   Not on file   Social Determinants of Health   Financial Resource Strain: Not on file  Food Insecurity: Not on file  Transportation Needs: Not on file  Physical Activity: Not on file  Stress: Not on file  Social Connections: Not on file  Intimate Partner Violence: Not on file    Review of  Systems:  All other review of systems negative except as mentioned in the HPI.  Physical Exam: Vital signs BP 130/72   Pulse (!) 106   Temp 98.6 F (37 C) (Temporal)   Resp 13   Ht 5\' 8"  (1.727 m)   Wt 200 lb (90.7 kg)   SpO2 92%   BMI 30.41 kg/m   General:   Alert,  Well-developed, well-nourished, pleasant and cooperative in NAD Airway:  Mallampati 1 Lungs:  Clear throughout to auscultation.   Heart:  Regular rate and rhythm; no murmurs, clicks, rubs,  or gallops. Abdomen:  Soft, nontender and nondistended. Normal bowel sounds.   Neuro/Psych:  Normal mood and affect. A and O x 3   Ralpheal Zappone E. Tomasa Rand, MD [facility]

## 2022-09-05 NOTE — Progress Notes (Signed)
Report to PACU, RN, vss, BBS= Clear.  

## 2022-09-05 NOTE — Progress Notes (Signed)
Called to room to assist during endoscopic procedure.  Patient ID and intended procedure confirmed with present staff. Received instructions for my participation in the procedure from the performing physician.  

## 2022-09-05 NOTE — Patient Instructions (Addendum)
-   Follow up with Dr. Maisie Fus to discuss surgery.   Resume previous diet and medications.  Await results for final recommendations.     YOU HAD AN ENDOSCOPIC PROCEDURE TODAY AT THE [facility]:   Refer to the procedure report that was given to you for any specific questions about what was found during the examination.  If the procedure report does not answer your questions, please call your gastroenterologist to clarify.  If you requested that your care partner not be given the details of your procedure findings, then the procedure report has been included in a sealed envelope for you to review at your convenience later.  YOU SHOULD EXPECT: Some feelings of bloating in the abdomen. Passage of more gas than usual.  Walking can help get rid of the air that was put into your GI tract during the procedure and reduce the bloating. If you had a lower endoscopy (such as a colonoscopy or flexible sigmoidoscopy) you may notice spotting of blood in your stool or on the toilet paper. If you underwent a bowel prep for your procedure, you may not have a normal bowel movement for a few days.  Please Note:  You might notice some irritation and congestion in your nose or some drainage.  This is from the oxygen used during your procedure.  There is no need for concern and it should clear up in a day or so.  SYMPTOMS TO REPORT IMMEDIATELY:  Following lower endoscopy (colonoscopy or flexible sigmoidoscopy):  Excessive amounts of blood in the stool  Significant tenderness or worsening of abdominal pains  Swelling of the abdomen that is new, acute  Fever of 100F or higher  Following upper endoscopy (EGD)  Vomiting of blood or coffee ground material  New chest pain or pain under the shoulder blades  Painful or persistently difficult swallowing  New shortness of breath  Fever of 100F or higher  Black, tarry-looking stools  For urgent or emergent issues, a gastroenterologist can be reached at any  hour by calling [phone]. Do not use MyChart messaging for urgent concerns.    DIET:  We do recommend a small meal at first, but then you may proceed to your regular diet.  Drink plenty of fluids but you should avoid alcoholic beverages for 24 hours.  ACTIVITY:  You should plan to take it easy for the rest of today and you should NOT DRIVE or use heavy machinery until tomorrow (because of the sedation medicines used during the test).    FOLLOW UP: Our staff will call the number listed on your records the next business day following your procedure.  We will call around 7:15- 8:00 am to check on you and address any questions or concerns that you may have regarding the information given to you following your procedure. If we do not reach you, we will leave a message.     If any biopsies were taken you will be contacted by phone or by letter within the next 1-3 weeks.  Please call us at [phone] if you have not heard about the biopsies in 3 weeks.    SIGNATURES/CONFIDENTIALITY: You and/or your care partner have signed paperwork which will be entered into your electronic medical record.  These signatures attest to the fact that that the information above on your After Visit Summary has been reviewed and is understood.  Full responsibility of the confidentiality of this discharge information lies with you and/or your care-partner.

## 2022-09-05 NOTE — Progress Notes (Signed)
Pt's states no medical or surgical changes since previsit or office visit. 

## 2022-09-06 ENCOUNTER — Telehealth: Payer: Self-pay

## 2022-09-06 NOTE — Telephone Encounter (Signed)
  Follow up Call-     09/05/2022    2:51 PM 08/21/2022   10:31 AM  Call back number  Post procedure Call Back phone  # [phone]  Permission to leave phone message Yes Yes     Patient questions:  Do you have a fever, pain , or abdominal swelling? No. Pain Score  0 *  Have you tolerated food without any problems? Yes.    Have you been able to return to your normal activities? Yes.    Do you have any questions about your discharge instructions: Diet   No. Medications  No. Follow up visit  No.  Do you have questions or concerns about your Care? No.  Actions: * If pain score is 4 or above: No action needed, pain <4.

## 2022-09-07 ENCOUNTER — Other Ambulatory Visit (HOSPITAL_BASED_OUTPATIENT_CLINIC_OR_DEPARTMENT_OTHER): Payer: Medicare Other

## 2022-09-11 ENCOUNTER — Encounter: Payer: Self-pay | Admitting: Gastroenterology

## 2022-09-12 NOTE — Telephone Encounter (Signed)
[facility] pathology called, results should be back later this afternoon. Patient notified.

## 2022-09-14 ENCOUNTER — Telehealth: Payer: Self-pay | Admitting: Gastroenterology

## 2022-09-14 NOTE — Telephone Encounter (Signed)
Spoke with wife who called the on-call service.  He has been having abdominal pain with night sweats, temp up to 100.8 today.  This has been going on for several days.  He is eating a small amount and drinking.  He does not want to home to the ED.  EGD normal.  Colonoscopy showed likely a malignant mass in the colon that was biopsied extensively.  Still awaiting biopsy results.  They have an appt with Dr. Maisie Fus on Monday.  She was advised to continue measures at home and certainly if abdominal pain worsens, he becomes unable to tolerate p.o. liquids, etc. temperature worsens, etc then he would need to come to the ER for evaluation as there is not much else we can do over the weekend.

## 2022-09-16 ENCOUNTER — Other Ambulatory Visit: Payer: Self-pay | Admitting: General Surgery

## 2022-09-16 DIAGNOSIS — K6389 Other specified diseases of intestine: Secondary | ICD-10-CM | POA: Diagnosis not present

## 2022-09-18 ENCOUNTER — Emergency Department (HOSPITAL_COMMUNITY): Payer: Medicare Other

## 2022-09-18 ENCOUNTER — Emergency Department (HOSPITAL_COMMUNITY)
Admission: EM | Admit: 2022-09-18 | Discharge: 2022-09-18 | Disposition: A | Discharge status: Home / Self Care | Payer: Medicare Other | Source: Home / Self Care | Attending: Emergency Medicine | Admitting: Emergency Medicine

## 2022-09-18 ENCOUNTER — Encounter (HOSPITAL_COMMUNITY): Payer: Self-pay

## 2022-09-18 ENCOUNTER — Other Ambulatory Visit: Payer: Self-pay

## 2022-09-18 DIAGNOSIS — R61 Generalized hyperhidrosis: Secondary | ICD-10-CM | POA: Insufficient documentation

## 2022-09-18 DIAGNOSIS — R1110 Vomiting, unspecified: Secondary | ICD-10-CM | POA: Diagnosis not present

## 2022-09-18 DIAGNOSIS — R000 Tachycardia, unspecified: Secondary | ICD-10-CM | POA: Insufficient documentation

## 2022-09-18 DIAGNOSIS — D72829 Elevated white blood cell count, unspecified: Secondary | ICD-10-CM | POA: Insufficient documentation

## 2022-09-18 DIAGNOSIS — K3189 Other diseases of stomach and duodenum: Secondary | ICD-10-CM | POA: Diagnosis not present

## 2022-09-18 DIAGNOSIS — K529 Noninfective gastroenteritis and colitis, unspecified: Secondary | ICD-10-CM | POA: Insufficient documentation

## 2022-09-18 DIAGNOSIS — C8339 Diffuse large B-cell lymphoma, extranodal and solid organ sites: Secondary | ICD-10-CM | POA: Diagnosis not present

## 2022-09-18 DIAGNOSIS — K6389 Other specified diseases of intestine: Secondary | ICD-10-CM

## 2022-09-18 DIAGNOSIS — C189 Malignant neoplasm of colon, unspecified: Secondary | ICD-10-CM | POA: Diagnosis not present

## 2022-09-18 DIAGNOSIS — R509 Fever, unspecified: Secondary | ICD-10-CM | POA: Diagnosis not present

## 2022-09-18 DIAGNOSIS — C8330 Diffuse large B-cell lymphoma, unspecified site: Secondary | ICD-10-CM | POA: Diagnosis not present

## 2022-09-18 DIAGNOSIS — Z85038 Personal history of other malignant neoplasm of large intestine: Secondary | ICD-10-CM | POA: Insufficient documentation

## 2022-09-18 LAB — BASIC METABOLIC PANEL
Anion gap: 13 (ref 5–15)
BUN: 15 mg/dL (ref 8–23)
CO2: 24 mmol/L (ref 22–32)
Calcium: 9 mg/dL (ref 8.9–10.3)
Chloride: 95 mmol/L — ABNORMAL LOW (ref 98–111)
Creatinine, Ser: 0.92 mg/dL (ref 0.61–1.24)
GFR, Estimated: >60 mL/min (ref >60)
Glucose, Bld: 111 mg/dL — ABNORMAL HIGH (ref 70–99)
Potassium: 5 mmol/L (ref 3.5–5.1)
Sodium: 132 mmol/L — ABNORMAL LOW (ref 135–145)

## 2022-09-18 LAB — LACTIC ACID, PLASMA: Lactic Acid, Venous: 2.4 mmol/L (ref 0.5–1.9)

## 2022-09-18 LAB — CBC
HCT: 42.9 % (ref 39.0–52.0)
Hemoglobin: 13.2 g/dL (ref 13.0–17.0)
MCH: 24.5 pg — ABNORMAL LOW (ref 26.0–34.0)
MCHC: 30.8 g/dL (ref 30.0–36.0)
MCV: 79.7 fL — ABNORMAL LOW (ref 80.0–100.0)
Platelets: 406 10*3/uL — ABNORMAL HIGH (ref 150–400)
RBC: 5.38 MIL/uL (ref 4.22–5.81)
RDW: 16 % — ABNORMAL HIGH (ref 11.5–15.5)
WBC: 12.1 10*3/uL — ABNORMAL HIGH (ref 4.0–10.5)
nRBC: 0 % (ref 0.0–0.2)

## 2022-09-18 LAB — URINALYSIS, ROUTINE W REFLEX MICROSCOPIC
Bilirubin Urine: NEGATIVE
Glucose, UA: NEGATIVE mg/dL
Hgb urine dipstick: NEGATIVE
Ketones, ur: NEGATIVE mg/dL
Leukocytes,Ua: NEGATIVE
Nitrite: NEGATIVE
Protein, ur: NEGATIVE mg/dL
Specific Gravity, Urine: >1.046 — ABNORMAL HIGH (ref 1.005–1.030)
pH: 5 (ref 5.0–8.0)

## 2022-09-18 LAB — HEPATIC FUNCTION PANEL
ALT: 15 U/L (ref 0–44)
AST: 32 U/L (ref 15–41)
Albumin: 3 g/dL — ABNORMAL LOW (ref 3.5–5.0)
Alkaline Phosphatase: 77 U/L (ref 38–126)
Bilirubin, Direct: 0.2 mg/dL (ref 0.0–0.2)
Indirect Bilirubin: 1 mg/dL — ABNORMAL HIGH (ref 0.3–0.9)
Total Bilirubin: 1.2 mg/dL (ref 0.3–1.2)
Total Protein: 7 g/dL (ref 6.5–8.1)

## 2022-09-18 MED ORDER — MORPHINE SULFATE (PF) 4 MG/ML IV SOLN
4.0000 mg | Freq: Once | INTRAVENOUS | Status: AC
  Administered 2022-09-18: 4 mg via INTRAVENOUS
  Filled 2022-09-18: qty 1

## 2022-09-18 MED ORDER — DIPHENHYDRAMINE-ZINC ACETATE 2-0.1 % EX CREA: 1.0000 | TOPICAL_CREAM | Freq: Three times a day (TID) | CUTANEOUS | 0 refills | Status: DC | PRN

## 2022-09-18 MED ORDER — SODIUM CHLORIDE 0.9 % IV BOLUS
1000.0000 mL | Freq: Once | INTRAVENOUS | Status: AC
  Administered 2022-09-18: 1000 mL via INTRAVENOUS

## 2022-09-18 MED ORDER — PIPERACILLIN-TAZOBACTAM 3.375 G IVPB 30 MIN
3.3750 g | Freq: Once | INTRAVENOUS | Status: AC
  Administered 2022-09-18: 3.375 g via INTRAVENOUS
  Filled 2022-09-18: qty 50

## 2022-09-18 MED ORDER — ONDANSETRON HCL 4 MG/2ML IJ SOLN
4.0000 mg | Freq: Once | INTRAMUSCULAR | Status: AC
  Administered 2022-09-18: 4 mg via INTRAVENOUS
  Filled 2022-09-18: qty 2

## 2022-09-18 MED ORDER — IOHEXOL 300 MG/ML  SOLN
100.0000 mL | Freq: Once | INTRAMUSCULAR | Status: AC | PRN
  Administered 2022-09-18: 100 mL via INTRAVENOUS

## 2022-09-18 MED ORDER — AMOXICILLIN-POT CLAVULANATE 875-125 MG PO TABS: 1.0000 | ORAL_TABLET | Freq: Two times a day (BID) | ORAL | 0 refills | Status: DC

## 2022-09-18 NOTE — Discharge Instructions (Addendum)
As we discussed, your cancer has progressed.  You also have some mild colitis.  You were given IV antibiotics and I have prescribed Augmentin twice daily for a week  You may use Benadryl pills as needed for itchiness and you can also try Benadryl cream  You have an appointment at 3 PM on Friday with the oncology center.  Please call them tomorrow to confirm  Return to ER if you have worse fevers, abdominal pain, trouble breathing

## 2022-09-18 NOTE — ED Notes (Signed)
Pt placed on 2 L [place].  

## 2022-09-18 NOTE — ED Provider Notes (Signed)
[facility] [place] EMERGENCY DEPARTMENT AT [facility] Provider Note   CSN: [id number] Arrival date & time: 09/18/22  1438     History  Chief Complaint  Patient presents with   Weakness   Night Sweats   Weight Loss    Not eating/drinking    Justin Cameron is a 67 y.o. male history of colon cancer here presenting with night sweats, abdominal pain and nausea.  Patient states that he had a scan about a month ago that showed a mass in his colon.  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  He was initially referred to surgery and saw Dr. Maisie Fus 2 days ago.  Dr. Maisie Fus discussed that this cancer is responsive to chemotherapy.  Patient states that he was referred to oncology but has not gotten an appointment yet.  He states that he has poor appetite and is on a liquid diet but still vomiting.  Patient also has some abdominal pain.  Patient states that he is constipated for several days then had use a bowel prep and is going normally for the last 2 days.  Patient states that he is still passing gas.  Patient also has been having low-grade temperature 100.8 at home for the last several days.  The history is provided by the patient.       Home Medications Prior to Admission medications   Medication Sig Start Date End Date Taking? Authorizing Provider  ALPRAZolam (XANAX) 0.25 MG tablet Take 0.125-0.25 mg by mouth 3 (three) times daily.    [provider]  colchicine 0.6 MG tablet Take 0.6 mg by mouth daily. 07/19/22   [provider]  dicyclomine (BENTYL) 20 MG tablet TAKE 1 TABLET(20 MG) BY MOUTH EVERY 6 HOURS 08/19/22   Jenel Lucks, MD  diphenhydramine-acetaminophen (TYLENOL PM) 25-500 MG TABS tablet Take 1 tablet by mouth at bedtime.    [provider]  ondansetron (ZOFRAN) 4 MG tablet TAKE 1 TABLET(4 MG) BY MOUTH EVERY 8 HOURS AS NEEDED FOR NAUSEA OR VOMITING 08/19/22    Jenel Lucks, MD  simvastatin (ZOCOR) 20 MG tablet Take 20 mg by mouth.    [provider]  traZODone (DESYREL) 50 MG tablet Take 50-100 mg by mouth at bedtime as needed. 07/26/22   [provider]      Allergies    Patient has no known allergies.    Review of Systems   Review of Systems  Gastrointestinal:  Positive for abdominal pain and vomiting.  Neurological:  Positive for weakness.  All other systems reviewed and are negative.   Physical Exam Updated Vital Signs BP 117/76 (BP Location: Left Arm)   Pulse (!) 117   Temp 97.6 F (36.4 C) (Oral)   Resp 17   Ht 5\' 8"  (1.727 m)   Wt 78.7 kg   SpO2 92%   BMI 26.40 kg/m  Physical Exam Vitals and nursing note reviewed.  Constitutional:      Comments: Chronically ill and dehydrated  HENT:     Head: Normocephalic.     Nose: Nose normal.     Mouth/Throat:     Mouth: Mucous membranes are moist.  Eyes:     Extraocular Movements: Extraocular movements intact.     Pupils: Pupils are equal, round, and reactive to light.  Cardiovascular:     Rate and Rhythm: Normal rate and regular rhythm.  Pulmonary:     Effort:  Pulmonary effort is normal.     Breath sounds: Normal breath sounds.  Abdominal:     General: Abdomen is flat.     Palpations: Abdomen is soft.     Comments: Mild right-sided abdominal tenderness  Musculoskeletal:        General: Normal range of motion.     Cervical back: Normal range of motion and neck supple.  Skin:    General: Skin is warm.  Neurological:     General: No focal deficit present.     Mental Status: He is alert and oriented to person, place, and time.  Psychiatric:        Mood and Affect: Mood normal.        Behavior: Behavior normal.     ED Results / Procedures / Treatments   Labs (all labs ordered are listed, but only abnormal results are displayed) Labs Reviewed  BASIC METABOLIC PANEL - Abnormal; Notable for the following components:      Result Value   Sodium  132 (*)    Chloride 95 (*)    Glucose, Bld 111 (*)    All other components within normal limits  CBC - Abnormal; Notable for the following components:   WBC 12.1 (*)    MCV 79.7 (*)    MCH 24.5 (*)    RDW 16.0 (*)    Platelets 406 (*)    All other components within normal limits  CULTURE, BLOOD (ROUTINE X 2)  CULTURE, BLOOD (ROUTINE X 2)  URINALYSIS, ROUTINE W REFLEX MICROSCOPIC  HEPATIC FUNCTION PANEL  LACTIC ACID, PLASMA  LACTIC ACID, PLASMA    EKG EKG Interpretation  Date/Time:  Wednesday Sep 18 2022 14:47:59 EDT Ventricular Rate:  120 PR Interval:  146 QRS Duration: 96 QT Interval:  290 QTC Calculation: 412 R Axis:   23 Text Interpretation: Sinus tachycardia Borderline T abnormalities, diffuse leads No significant change since last tracing Confirmed by Richardean Canal [phone]) on 09/18/2022 3:29:45 PM  Radiology No results found.  Procedures Procedures    Medications Ordered in ED Medications  sodium chloride 0.9 % bolus 1,000 mL (has no administration in time range)  morphine (PF) 4 MG/ML injection 4 mg (has no administration in time range)  ondansetron (ZOFRAN) injection 4 mg (has no administration in time range)    ED Course/ Medical Decision Making/ A&P                             Medical Decision Making Justin Cameron is a 67 y.o. male here presenting with low-grade temperature and abdominal pain.  He has B-cell lymphoma biopsy.  Concern for obstruction versus pneumonia versus colitis versus worsening cancer.  Plan to get CBC and CMP and CT abdomen pelvis and chest x-ray and urinalysis.  Will hydrate and reassess.  Will also consult oncology to help expedite his referral.   7:05 PM I reviewed patient's labs and independently interpreted imaging studies.  Patient's white blood cell count is 12 and lactate is 2.4.  Patient's CT scan showed enlarging mass of the colon with some fat stranding.  Patient does not have an obstruction right now.  I was able to  contact Dr. Celso Sickle and able to get her an appointment Friday at 3 PM at the cancer center.  She is given Zosyn empirically and will be discharged home with Augmentin.  Of note, after IV fluids tachycardia resolved.  Patient has borderline oxygen level but has  sleep apnea.  He has been off oxygen now and his oxygen level is 92 to 93%.  Problems Addressed: Colitis: acute illness or injury Colonic mass: acute illness or injury  Amount and/or Complexity of Data Reviewed Labs: ordered. Decision-making details documented in ED Course. Radiology: ordered and independent interpretation performed. Decision-making details documented in ED Course.  Risk Prescription drug management.    Final Clinical Impression(s) / ED Diagnoses Final diagnoses:  None    Rx / DC Orders ED Discharge Orders     None         Charlynne Pander, MD 09/18/22 [facility]

## 2022-09-18 NOTE — ED Triage Notes (Signed)
Wife reports pt  Recent dx of lymphoma colon Not eating/drinking For a few weeks Told to go on liquid diet by PCP Night sweats Weakness Since Sunday Loss weight 3 pounds since Monday Low-grade fever Since Monday Nausea Taking zofran at home

## 2022-09-18 NOTE — ED Notes (Signed)
Pt wheeled from ed transferred to car. Pt verbalized understanding of discharge instructions.

## 2022-09-19 ENCOUNTER — Inpatient Hospital Stay: Payer: Medicare Other

## 2022-09-19 ENCOUNTER — Inpatient Hospital Stay (HOSPITAL_COMMUNITY)
Admission: AD | Admit: 2022-09-19 | Discharge: 2022-10-28 | DRG: 820 | Disposition: E | Payer: Medicare Other | Source: Ambulatory Visit | Attending: Internal Medicine | Admitting: Internal Medicine

## 2022-09-19 ENCOUNTER — Other Ambulatory Visit: Payer: Self-pay | Admitting: Hematology

## 2022-09-19 ENCOUNTER — Encounter: Payer: Self-pay | Admitting: Hematology

## 2022-09-19 ENCOUNTER — Other Ambulatory Visit: Payer: Self-pay

## 2022-09-19 ENCOUNTER — Inpatient Hospital Stay: Payer: Medicare Other | Attending: Physician Assistant | Admitting: Hematology

## 2022-09-19 ENCOUNTER — Encounter (HOSPITAL_COMMUNITY): Payer: Self-pay

## 2022-09-19 VITALS — BP 109/69 | HR 117 | Temp 99.1°F | Resp 17 | Ht 68.0 in | Wt 177.9 lb

## 2022-09-19 DIAGNOSIS — J9811 Atelectasis: Secondary | ICD-10-CM | POA: Diagnosis not present

## 2022-09-19 DIAGNOSIS — M542 Cervicalgia: Secondary | ICD-10-CM | POA: Diagnosis present

## 2022-09-19 DIAGNOSIS — C8590 Non-Hodgkin lymphoma, unspecified, unspecified site: Secondary | ICD-10-CM | POA: Diagnosis not present

## 2022-09-19 DIAGNOSIS — T8131XA Disruption of external operation (surgical) wound, not elsewhere classified, initial encounter: Secondary | ICD-10-CM | POA: Diagnosis not present

## 2022-09-19 DIAGNOSIS — T8110XA Postprocedural shock unspecified, initial encounter: Secondary | ICD-10-CM | POA: Diagnosis not present

## 2022-09-19 DIAGNOSIS — Z5111 Encounter for antineoplastic chemotherapy: Secondary | ICD-10-CM

## 2022-09-19 DIAGNOSIS — R9089 Other abnormal findings on diagnostic imaging of central nervous system: Secondary | ICD-10-CM

## 2022-09-19 DIAGNOSIS — C8339 Diffuse large B-cell lymphoma, extranodal and solid organ sites: Secondary | ICD-10-CM | POA: Diagnosis not present

## 2022-09-19 DIAGNOSIS — Z806 Family history of leukemia: Secondary | ICD-10-CM

## 2022-09-19 DIAGNOSIS — D497 Neoplasm of unspecified behavior of endocrine glands and other parts of nervous system: Secondary | ICD-10-CM | POA: Diagnosis present

## 2022-09-19 DIAGNOSIS — R1011 Right upper quadrant pain: Secondary | ICD-10-CM

## 2022-09-19 DIAGNOSIS — G9341 Metabolic encephalopathy: Secondary | ICD-10-CM | POA: Diagnosis not present

## 2022-09-19 DIAGNOSIS — Z79899 Other long term (current) drug therapy: Secondary | ICD-10-CM

## 2022-09-19 DIAGNOSIS — I340 Nonrheumatic mitral (valve) insufficiency: Secondary | ICD-10-CM | POA: Diagnosis not present

## 2022-09-19 DIAGNOSIS — F419 Anxiety disorder, unspecified: Secondary | ICD-10-CM | POA: Diagnosis present

## 2022-09-19 DIAGNOSIS — R066 Hiccough: Secondary | ICD-10-CM | POA: Diagnosis not present

## 2022-09-19 DIAGNOSIS — E2740 Unspecified adrenocortical insufficiency: Secondary | ICD-10-CM | POA: Diagnosis present

## 2022-09-19 DIAGNOSIS — E861 Hypovolemia: Secondary | ICD-10-CM | POA: Diagnosis present

## 2022-09-19 DIAGNOSIS — C8333 Diffuse large B-cell lymphoma, intra-abdominal lymph nodes: Secondary | ICD-10-CM | POA: Diagnosis not present

## 2022-09-19 DIAGNOSIS — R609 Edema, unspecified: Secondary | ICD-10-CM | POA: Diagnosis not present

## 2022-09-19 DIAGNOSIS — K7689 Other specified diseases of liver: Secondary | ICD-10-CM | POA: Diagnosis not present

## 2022-09-19 DIAGNOSIS — I82453 Acute embolism and thrombosis of peroneal vein, bilateral: Secondary | ICD-10-CM | POA: Diagnosis not present

## 2022-09-19 DIAGNOSIS — K631 Perforation of intestine (nontraumatic): Secondary | ICD-10-CM | POA: Diagnosis present

## 2022-09-19 DIAGNOSIS — G4700 Insomnia, unspecified: Secondary | ICD-10-CM | POA: Diagnosis present

## 2022-09-19 DIAGNOSIS — R61 Generalized hyperhidrosis: Secondary | ICD-10-CM | POA: Diagnosis present

## 2022-09-19 DIAGNOSIS — Z0189 Encounter for other specified special examinations: Secondary | ICD-10-CM | POA: Diagnosis not present

## 2022-09-19 DIAGNOSIS — E43 Unspecified severe protein-calorie malnutrition: Secondary | ICD-10-CM | POA: Diagnosis present

## 2022-09-19 DIAGNOSIS — T8143XA Infection following a procedure, organ and space surgical site, initial encounter: Secondary | ICD-10-CM | POA: Diagnosis not present

## 2022-09-19 DIAGNOSIS — G9389 Other specified disorders of brain: Secondary | ICD-10-CM | POA: Diagnosis not present

## 2022-09-19 DIAGNOSIS — F19982 Other psychoactive substance use, unspecified with psychoactive substance-induced sleep disorder: Secondary | ICD-10-CM | POA: Diagnosis not present

## 2022-09-19 DIAGNOSIS — K651 Peritoneal abscess: Secondary | ICD-10-CM | POA: Diagnosis not present

## 2022-09-19 DIAGNOSIS — Q430 Meckel's diverticulum (displaced) (hypertrophic): Secondary | ICD-10-CM

## 2022-09-19 DIAGNOSIS — R634 Abnormal weight loss: Secondary | ICD-10-CM | POA: Diagnosis present

## 2022-09-19 DIAGNOSIS — J9601 Acute respiratory failure with hypoxia: Secondary | ICD-10-CM | POA: Diagnosis not present

## 2022-09-19 DIAGNOSIS — C7989 Secondary malignant neoplasm of other specified sites: Secondary | ICD-10-CM | POA: Diagnosis present

## 2022-09-19 DIAGNOSIS — J90 Pleural effusion, not elsewhere classified: Secondary | ICD-10-CM | POA: Diagnosis not present

## 2022-09-19 DIAGNOSIS — E872 Acidosis, unspecified: Secondary | ICD-10-CM | POA: Diagnosis present

## 2022-09-19 DIAGNOSIS — A419 Sepsis, unspecified organism: Secondary | ICD-10-CM | POA: Diagnosis not present

## 2022-09-19 DIAGNOSIS — R5381 Other malaise: Secondary | ICD-10-CM | POA: Diagnosis not present

## 2022-09-19 DIAGNOSIS — R591 Generalized enlarged lymph nodes: Secondary | ICD-10-CM | POA: Diagnosis present

## 2022-09-19 DIAGNOSIS — Z66 Do not resuscitate: Secondary | ICD-10-CM | POA: Diagnosis not present

## 2022-09-19 DIAGNOSIS — I700 Atherosclerosis of aorta: Secondary | ICD-10-CM | POA: Diagnosis present

## 2022-09-19 DIAGNOSIS — R059 Cough, unspecified: Secondary | ICD-10-CM | POA: Diagnosis not present

## 2022-09-19 DIAGNOSIS — J9602 Acute respiratory failure with hypercapnia: Secondary | ICD-10-CM | POA: Diagnosis not present

## 2022-09-19 DIAGNOSIS — C7800 Secondary malignant neoplasm of unspecified lung: Secondary | ICD-10-CM | POA: Diagnosis present

## 2022-09-19 DIAGNOSIS — F32A Depression, unspecified: Secondary | ICD-10-CM | POA: Diagnosis present

## 2022-09-19 DIAGNOSIS — A4151 Sepsis due to Escherichia coli [E. coli]: Secondary | ICD-10-CM | POA: Diagnosis not present

## 2022-09-19 DIAGNOSIS — K5903 Drug induced constipation: Secondary | ICD-10-CM | POA: Diagnosis not present

## 2022-09-19 DIAGNOSIS — Z86718 Personal history of other venous thrombosis and embolism: Secondary | ICD-10-CM

## 2022-09-19 DIAGNOSIS — M1990 Unspecified osteoarthritis, unspecified site: Secondary | ICD-10-CM | POA: Diagnosis present

## 2022-09-19 DIAGNOSIS — M109 Gout, unspecified: Secondary | ICD-10-CM | POA: Diagnosis present

## 2022-09-19 DIAGNOSIS — D630 Anemia in neoplastic disease: Secondary | ICD-10-CM | POA: Diagnosis present

## 2022-09-19 DIAGNOSIS — E871 Hypo-osmolality and hyponatremia: Secondary | ICD-10-CM | POA: Diagnosis present

## 2022-09-19 DIAGNOSIS — D62 Acute posthemorrhagic anemia: Secondary | ICD-10-CM | POA: Diagnosis not present

## 2022-09-19 DIAGNOSIS — E876 Hypokalemia: Secondary | ICD-10-CM | POA: Diagnosis present

## 2022-09-19 DIAGNOSIS — R0600 Dyspnea, unspecified: Secondary | ICD-10-CM | POA: Diagnosis not present

## 2022-09-19 DIAGNOSIS — C8330 Diffuse large B-cell lymphoma, unspecified site: Secondary | ICD-10-CM | POA: Diagnosis not present

## 2022-09-19 DIAGNOSIS — R6521 Severe sepsis with septic shock: Secondary | ICD-10-CM | POA: Diagnosis not present

## 2022-09-19 DIAGNOSIS — C8591 Non-Hodgkin lymphoma, unspecified, lymph nodes of head, face, and neck: Secondary | ICD-10-CM | POA: Diagnosis not present

## 2022-09-19 DIAGNOSIS — Z85038 Personal history of other malignant neoplasm of large intestine: Secondary | ICD-10-CM

## 2022-09-19 DIAGNOSIS — Z7189 Other specified counseling: Secondary | ICD-10-CM | POA: Diagnosis not present

## 2022-09-19 DIAGNOSIS — Z6828 Body mass index (BMI) 28.0-28.9, adult: Secondary | ICD-10-CM

## 2022-09-19 DIAGNOSIS — N179 Acute kidney failure, unspecified: Secondary | ICD-10-CM | POA: Diagnosis not present

## 2022-09-19 DIAGNOSIS — R627 Adult failure to thrive: Secondary | ICD-10-CM | POA: Diagnosis present

## 2022-09-19 DIAGNOSIS — Z4659 Encounter for fitting and adjustment of other gastrointestinal appliance and device: Secondary | ICD-10-CM | POA: Diagnosis not present

## 2022-09-19 DIAGNOSIS — G893 Neoplasm related pain (acute) (chronic): Secondary | ICD-10-CM | POA: Diagnosis not present

## 2022-09-19 DIAGNOSIS — K5669 Other partial intestinal obstruction: Secondary | ICD-10-CM | POA: Diagnosis not present

## 2022-09-19 DIAGNOSIS — E7800 Pure hypercholesterolemia, unspecified: Secondary | ICD-10-CM | POA: Diagnosis present

## 2022-09-19 DIAGNOSIS — I63233 Cerebral infarction due to unspecified occlusion or stenosis of bilateral carotid arteries: Secondary | ICD-10-CM | POA: Diagnosis not present

## 2022-09-19 DIAGNOSIS — K6389 Other specified diseases of intestine: Secondary | ICD-10-CM | POA: Diagnosis not present

## 2022-09-19 DIAGNOSIS — R1084 Generalized abdominal pain: Secondary | ICD-10-CM | POA: Diagnosis not present

## 2022-09-19 DIAGNOSIS — E785 Hyperlipidemia, unspecified: Secondary | ICD-10-CM | POA: Diagnosis not present

## 2022-09-19 DIAGNOSIS — Z515 Encounter for palliative care: Secondary | ICD-10-CM

## 2022-09-19 DIAGNOSIS — I4891 Unspecified atrial fibrillation: Secondary | ICD-10-CM | POA: Diagnosis not present

## 2022-09-19 DIAGNOSIS — Z4682 Encounter for fitting and adjustment of non-vascular catheter: Secondary | ICD-10-CM | POA: Diagnosis not present

## 2022-09-19 DIAGNOSIS — I639 Cerebral infarction, unspecified: Secondary | ICD-10-CM | POA: Diagnosis not present

## 2022-09-19 DIAGNOSIS — E860 Dehydration: Secondary | ICD-10-CM | POA: Diagnosis present

## 2022-09-19 DIAGNOSIS — Z87891 Personal history of nicotine dependence: Secondary | ICD-10-CM | POA: Diagnosis not present

## 2022-09-19 DIAGNOSIS — F05 Delirium due to known physiological condition: Secondary | ICD-10-CM | POA: Diagnosis present

## 2022-09-19 DIAGNOSIS — G4730 Sleep apnea, unspecified: Secondary | ICD-10-CM | POA: Diagnosis not present

## 2022-09-19 DIAGNOSIS — K269 Duodenal ulcer, unspecified as acute or chronic, without hemorrhage or perforation: Secondary | ICD-10-CM

## 2022-09-19 DIAGNOSIS — G4733 Obstructive sleep apnea (adult) (pediatric): Secondary | ICD-10-CM | POA: Diagnosis present

## 2022-09-19 DIAGNOSIS — Z95828 Presence of other vascular implants and grafts: Secondary | ICD-10-CM

## 2022-09-19 DIAGNOSIS — I361 Nonrheumatic tricuspid (valve) insufficiency: Secondary | ICD-10-CM | POA: Diagnosis not present

## 2022-09-19 DIAGNOSIS — R109 Unspecified abdominal pain: Secondary | ICD-10-CM | POA: Diagnosis not present

## 2022-09-19 DIAGNOSIS — Z452 Encounter for adjustment and management of vascular access device: Secondary | ICD-10-CM | POA: Diagnosis not present

## 2022-09-19 DIAGNOSIS — Z4803 Encounter for change or removal of drains: Secondary | ICD-10-CM | POA: Diagnosis not present

## 2022-09-19 DIAGNOSIS — I82409 Acute embolism and thrombosis of unspecified deep veins of unspecified lower extremity: Secondary | ICD-10-CM | POA: Diagnosis not present

## 2022-09-19 DIAGNOSIS — D6489 Other specified anemias: Secondary | ICD-10-CM | POA: Diagnosis not present

## 2022-09-19 LAB — COMPREHENSIVE METABOLIC PANEL
ALT: 13 U/L (ref 0–44)
AST: 29 U/L (ref 15–41)
Albumin: 2.9 g/dL — ABNORMAL LOW (ref 3.5–5.0)
Alkaline Phosphatase: 66 U/L (ref 38–126)
Anion gap: 13 (ref 5–15)
BUN: 14 mg/dL (ref 8–23)
CO2: 24 mmol/L (ref 22–32)
Calcium: 8.6 mg/dL — ABNORMAL LOW (ref 8.9–10.3)
Chloride: 94 mmol/L — ABNORMAL LOW (ref 98–111)
Creatinine, Ser: 0.89 mg/dL (ref 0.61–1.24)
GFR, Estimated: >60 mL/min (ref >60)
Glucose, Bld: 99 mg/dL (ref 70–99)
Potassium: 4.4 mmol/L (ref 3.5–5.1)
Sodium: 131 mmol/L — ABNORMAL LOW (ref 135–145)
Total Bilirubin: 0.9 mg/dL (ref 0.3–1.2)
Total Protein: 6.6 g/dL (ref 6.5–8.1)

## 2022-09-19 LAB — CBC WITH DIFFERENTIAL/PLATELET
Abs Immature Granulocytes: 0.07 10*3/uL (ref 0.00–0.07)
Basophils Absolute: 0.1 10*3/uL (ref 0.0–0.1)
Basophils Relative: 1 %
Eosinophils Absolute: 0.2 10*3/uL (ref 0.0–0.5)
Eosinophils Relative: 2 %
HCT: 39.9 % (ref 39.0–52.0)
Hemoglobin: 12.1 g/dL — ABNORMAL LOW (ref 13.0–17.0)
Immature Granulocytes: 1 %
Lymphocytes Relative: 8 %
Lymphs Abs: 0.9 10*3/uL (ref 0.7–4.0)
MCH: 24.6 pg — ABNORMAL LOW (ref 26.0–34.0)
MCHC: 30.3 g/dL (ref 30.0–36.0)
MCV: 81.3 fL (ref 80.0–100.0)
Monocytes Absolute: 1.2 10*3/uL — ABNORMAL HIGH (ref 0.1–1.0)
Monocytes Relative: 12 %
Neutro Abs: 8.1 10*3/uL — ABNORMAL HIGH (ref 1.7–7.7)
Neutrophils Relative %: 76 %
Platelets: 336 10*3/uL (ref 150–400)
RBC: 4.91 MIL/uL (ref 4.22–5.81)
RDW: 15.9 % — ABNORMAL HIGH (ref 11.5–15.5)
WBC: 10.6 10*3/uL — ABNORMAL HIGH (ref 4.0–10.5)
nRBC: 0 % (ref 0.0–0.2)

## 2022-09-19 LAB — HEPATITIS B SURFACE ANTIGEN: Hepatitis B Surface Ag: NONREACTIVE

## 2022-09-19 LAB — URIC ACID: Uric Acid, Serum: 6.8 mg/dL (ref 3.7–8.6)

## 2022-09-19 LAB — HEPATITIS B CORE ANTIBODY, TOTAL: Hep B Core Total Ab: NONREACTIVE

## 2022-09-19 LAB — LACTATE DEHYDROGENASE: LDH: 184 U/L (ref 98–192)

## 2022-09-19 LAB — HEPATITIS C ANTIBODY: HCV Ab: NONREACTIVE

## 2022-09-19 LAB — LACTIC ACID, PLASMA
Lactic Acid, Venous: 2.5 mmol/L (ref 0.5–1.9)
Lactic Acid, Venous: 1.9 mmol/L (ref 0.5–1.9)

## 2022-09-19 LAB — APTT: aPTT: 29 seconds (ref 24–36)

## 2022-09-19 LAB — PROCALCITONIN: Procalcitonin: 0.22 ng/mL

## 2022-09-19 LAB — PHOSPHORUS: Phosphorus: 3.7 mg/dL (ref 2.5–4.6)

## 2022-09-19 LAB — CORTISOL: Cortisol, Plasma: 16.3 ug/dL

## 2022-09-19 LAB — CULTURE, BLOOD (ROUTINE X 2)

## 2022-09-19 LAB — MAGNESIUM: Magnesium: 1.8 mg/dL (ref 1.7–2.4)

## 2022-09-19 LAB — PROTIME-INR
INR: 1.1 (ref 0.8–1.2)
Prothrombin Time: 14.3 seconds (ref 11.4–15.2)

## 2022-09-19 MED ORDER — SENNOSIDES-DOCUSATE SODIUM 8.6-50 MG PO TABS
2.0000 | ORAL_TABLET | Freq: Two times a day (BID) | ORAL | Status: DC
  Administered 2022-09-19 – 2022-09-23 (×9): 2 via ORAL
  Filled 2022-09-19 (×9): qty 2

## 2022-09-19 MED ORDER — PROCHLORPERAZINE EDISYLATE 10 MG/2ML IJ SOLN
5.0000 mg | Freq: Four times a day (QID) | INTRAMUSCULAR | Status: DC | PRN
  Administered 2022-09-19 – 2022-10-11 (×5): 5 mg via INTRAVENOUS
  Filled 2022-09-19 (×5): qty 2

## 2022-09-19 MED ORDER — POLYETHYLENE GLYCOL 3350 17 G PO PACK: 17.0000 g | PACK | Freq: Every day | ORAL | Status: DC | PRN

## 2022-09-19 MED ORDER — DEXTROSE-SODIUM CHLORIDE 5-0.9 % IV SOLN: INTRAVENOUS | Status: DC

## 2022-09-19 MED ORDER — SODIUM CHLORIDE 0.9 % IV SOLN: INTRAVENOUS | Status: AC

## 2022-09-19 MED ORDER — OXYCODONE HCL 5 MG PO TABS
5.0000 mg | ORAL_TABLET | Freq: Four times a day (QID) | ORAL | Status: DC | PRN
  Administered 2022-09-19 – 2022-09-23 (×5): 5 mg via ORAL
  Filled 2022-09-19 (×7): qty 1

## 2022-09-19 MED ORDER — AMOXICILLIN-POT CLAVULANATE 875-125 MG PO TABS
1.0000 | ORAL_TABLET | Freq: Two times a day (BID) | ORAL | Status: DC
  Administered 2022-09-19: 1 via ORAL
  Filled 2022-09-19: qty 1

## 2022-09-19 MED ORDER — ENOXAPARIN SODIUM 40 MG/0.4ML IJ SOSY
40.0000 mg | PREFILLED_SYRINGE | INTRAMUSCULAR | Status: DC
  Administered 2022-09-19: 40 mg via SUBCUTANEOUS
  Filled 2022-09-19: qty 0.4

## 2022-09-19 MED ORDER — LORAZEPAM 0.5 MG PO TABS
0.5000 mg | ORAL_TABLET | Freq: Three times a day (TID) | ORAL | Status: DC | PRN
  Administered 2022-09-21 – 2022-09-23 (×4): 0.5 mg via ORAL
  Filled 2022-09-19 (×5): qty 1

## 2022-09-19 MED ORDER — METHYLPREDNISOLONE SODIUM SUCC 125 MG IJ SOLR
125.0000 mg | Freq: Two times a day (BID) | INTRAMUSCULAR | Status: DC
  Administered 2022-09-19 – 2022-09-21 (×3): 125 mg via INTRAVENOUS
  Filled 2022-09-19 (×3): qty 2

## 2022-09-19 MED ORDER — ALLOPURINOL 100 MG PO TABS
100.0000 mg | ORAL_TABLET | Freq: Two times a day (BID) | ORAL | Status: DC
  Administered 2022-09-19 – 2022-09-23 (×9): 100 mg via ORAL
  Filled 2022-09-19 (×9): qty 1

## 2022-09-19 MED ORDER — LIDOCAINE 5 % EX PTCH
1.0000 | MEDICATED_PATCH | CUTANEOUS | Status: AC
  Administered 2022-09-19 – 2022-09-20 (×2): 1 via TRANSDERMAL
  Filled 2022-09-19 (×3): qty 1

## 2022-09-19 MED ORDER — TRAZODONE HCL 50 MG PO TABS
50.0000 mg | ORAL_TABLET | Freq: Every evening | ORAL | Status: DC | PRN
  Administered 2022-09-20: 100 mg via ORAL
  Filled 2022-09-19 (×2): qty 2

## 2022-09-19 MED ORDER — DIPHENHYDRAMINE HCL 25 MG PO CAPS
25.0000 mg | ORAL_CAPSULE | Freq: Four times a day (QID) | ORAL | Status: DC | PRN
  Administered 2022-09-19 – 2022-09-22 (×3): 25 mg via ORAL
  Filled 2022-09-19 (×3): qty 1

## 2022-09-19 MED ORDER — PIPERACILLIN-TAZOBACTAM 3.375 G IVPB
3.3750 g | Freq: Three times a day (TID) | INTRAVENOUS | Status: DC
  Administered 2022-09-20 – 2022-09-28 (×24): 3.375 g via INTRAVENOUS
  Filled 2022-09-19 (×24): qty 50

## 2022-09-19 MED ORDER — PANTOPRAZOLE SODIUM 40 MG IV SOLR
40.0000 mg | INTRAVENOUS | Status: DC
  Administered 2022-09-19 – 2022-09-22 (×4): 40 mg via INTRAVENOUS
  Filled 2022-09-19 (×4): qty 10

## 2022-09-19 MED ORDER — ZOLPIDEM TARTRATE 5 MG PO TABS
5.0000 mg | ORAL_TABLET | Freq: Every evening | ORAL | Status: AC | PRN
  Administered 2022-09-19: 5 mg via ORAL
  Filled 2022-09-19: qty 1

## 2022-09-19 NOTE — Progress Notes (Signed)
HEMATOLOGY/ONCOLOGY CONSULTATION NOTE  Date of Service: 09/19/2022  Patient Care Team: Daisy Floro, MD as PCP - General (Family Medicine)  CHIEF COMPLAINTS/PURPOSE OF CONSULTATION:  Evaluation and management of newly diagnosed diffuse large B cell lymphoma  HISTORY OF PRESENTING ILLNESS:   Justin Cameron is a wonderful 67 y.o. male who has been referred to [place] by Daisy Floro, MD for evaluation and management of diffuse large B cell lymphoma.  Patient is an overall healthy 68 year old male with no significant chronic medical issues who presented with intermittent abdominal discomfort, fatigue and weight loss of 30 pounds since February 2024.  He notes that over the last 2 to 3 months he started developing significant constipation and difficulty with moving his bowels.  No GI bleeding.  His abdominal pains are getting much worse over the last month or so with poor p.o. intake and significant weight loss.  Patient was seen by Dr. Tomasa Rand in gastroenterology and had a colonoscopy on 08/21/2022 and had a colonoscopy biopsy which was indeterminate and only showed granulation tissue.  Patient's symptoms are worsening and he had a repeat colonoscopy with Dr. Tomasa Rand on 09/05/2022 which led to repeat biopsy that showed an aggressive large B-cell lymphoma.  Patient had a CT of the abdomen initially on 08/12/2022 which showed 10 cm complex lesion involving the hepatic flexure of the colon with peripheral nodular wall thickening and central necrosis.  Also noted to have multiple small right abdominal mesenteric lymph nodes measuring up to 10 mm and indeterminate bilateral adrenal masses. CT chest was done on 08/22/2022 which showed no definitive evidence of metastatic disease in the chest.  Patient was referred to Katrina Stack from surgery and was seen by her on 09/16/2022 who recommended medical oncology referral and no role for surgery.  Patient subsequently presented to  the ED on 09/18/2022 with night sweats, worsening abdominal pain abdominal pain, and nausea, lightheadedness and dizziness.  He received 2 L of normal saline and felt a little better and was discharged with appointment with medical oncology today.  In clinic the patient is extremely uncomfortable with abdominal pain and bloating and notes that this pain feels like somebody is stabbing him in the abdomen.  He is still passing gas and liquid stools and has been placed on a liquid diet.  He notes the pain is a little better when he lies down.  He has been taking MiraLAX at home.  Patient reports having three bowel flushes over the course of 4 weeks. He has felt fatigue and weakness since.   Patient notes some low-grade fevers no rigors.  No overt shortness of breath or chest pain.  Repeat CT scan of the abdomen done in the emergency room on 09/18/2022 showed enlarging mass at the hepatic flexure increased pericolonic fat stranding with loss of fat plane between the mass in the right lobe of the liver.  Increasing mesenteric lymphadenopathy and new soft tissue mass along the crus of the left diaphragm and enlarging bilateral adrenal masses consistent with progressive intra-abdominal metastases.  New and enlarging left lower lobe pulmonary nodules consistent with progressive metastatic pulmonary involvement.  He does report a history of arthritis, gout, and dyslipidemia. He denies any medication allergies. He reports that he has quit smoking 30 years ago. He had smoked one pack of cigarettes for 20 years prior to that. He denies any alcohol use or radiation exposure.  In regards to family history, he does report that his father has CLL and  diabetes. He denies any other history of cancer in the family. He reports that his mother has severe dementia following severe iron deficiency.   He does complain of frequent phlegm which has worsened over the past 6 months. He reports that his breathing has been low. His  SpO2 in clinic today is 95%.  He endorses daily night sweats beginning 3 weeks ago. Patient has also been experiencing sleep difficulties. He does endorse new left shoulder bone pain, less frequent urination habits, and lightheadedness. He complains of indigestion and does drink Gatorade and Boost regularly.  He reports endorsing gout flares in his left hand occasionally and takes Colchicine to manage symptoms. He does report severe itching in his posterior head.  MEDICAL HISTORY:  Past Medical History:  Diagnosis Date   Arthritis    High cholesterol     SURGICAL HISTORY: Past Surgical History:  Procedure Laterality Date   REPLACEMENT TOTAL KNEE  10/2018   ROTATOR CUFF REPAIR      SOCIAL HISTORY: Social History   Socioeconomic History   Marital status: Married    Spouse name: Not on file   Number of children: Not on file   Years of education: Not on file   Highest education level: Not on file  Occupational History   Not on file  Tobacco Use   Smoking status: Never   Smokeless tobacco: Never  Vaping Use   Vaping Use: Never used  Substance and Sexual Activity   Alcohol use: Not Currently   Drug use: Not Currently   Sexual activity: Not on file  Other Topics Concern   Not on file  Social History Narrative   Not on file   Social Determinants of Health   Financial Resource Strain: Not on file  Food Insecurity: Not on file  Transportation Needs: Not on file  Physical Activity: Not on file  Stress: Not on file  Social Connections: Not on file  Intimate Partner Violence: Not on file    FAMILY HISTORY: Family History  Problem Relation Age of Onset   Atrial fibrillation Mother    Alzheimer's disease Mother    Leukemia Father    Diabetes Father    Colon cancer Neg Hx    Rectal cancer Neg Hx    Esophageal cancer Neg Hx    Stomach cancer Neg Hx     ALLERGIES:  has No Known Allergies.  MEDICATIONS:  Current Outpatient Medications  Medication Sig Dispense  Refill   ALPRAZolam (XANAX) 0.25 MG tablet Take 0.125-0.25 mg by mouth 3 (three) times daily.     amoxicillin-clavulanate (AUGMENTIN) 875-125 MG tablet Take 1 tablet by mouth every 12 (twelve) hours. 14 tablet 0   colchicine 0.6 MG tablet Take 0.6 mg by mouth daily.     dicyclomine (BENTYL) 20 MG tablet TAKE 1 TABLET(20 MG) BY MOUTH EVERY 6 HOURS 90 tablet 3   diphenhydramine-acetaminophen (TYLENOL PM) 25-500 MG TABS tablet Take 1 tablet by mouth at bedtime.     diphenhydrAMINE-zinc acetate (BENADRYL EXTRA STRENGTH) cream Apply 1 Application topically 3 (three) times daily as needed for itching. 28.4 g 0   ondansetron (ZOFRAN) 4 MG tablet TAKE 1 TABLET(4 MG) BY MOUTH EVERY 8 HOURS AS NEEDED FOR NAUSEA OR VOMITING 90 tablet 3   simvastatin (ZOCOR) 20 MG tablet Take 20 mg by mouth.     traZODone (DESYREL) 50 MG tablet Take 50-100 mg by mouth at bedtime as needed.     No current facility-administered medications for this visit.  REVIEW OF SYSTEMS:    10 Point review of Systems was done is negative except as noted above.  PHYSICAL EXAMINATION: ECOG PERFORMANCE STATUS: 2 - Symptomatic, <50% confined to bed  . Vitals:   09/19/22 1352  BP: 109/69  Pulse: (!) 117  Resp: 17  Temp: 99.1 F (37.3 C)  SpO2: 95%   Filed Weights   09/19/22 1352  Weight: 177 lb 14.4 oz (80.7 kg)   .Body mass index is 27.05 kg/m.  GENERAL: Mild distress due to abdominal discomfort and bloating SKIN: no acute rashes, no significant lesions EYES: conjunctiva are pink and non-injected, sclera anicteric OROPHARYNX: Dry mucous membranes NECK: supple, no JVD LYMPH:  no palpable lymphadenopathy in the cervical, axillary or inguinal regions LUNGS: clear to auscultation b/l with normal respiratory effort HEART: regular rate & rhythm ABDOMEN: Tenderness to palpation over the upper abdomen especially over the right upper quadrant with obvious masslike fullness.  No rigidity or rebound. Extremity: no pedal  edema PSYCH: alert & oriented x 3 with fluent speech NEURO: no focal motor/sensory deficits  LABORATORY DATA:  I have reviewed the data as listed .    Latest Ref Rng & Units 09/18/2022    3:12 PM 08/21/2022   12:11 PM 09/04/2016   11:08 AM  CBC  WBC 4.0 - 10.5 K/uL 12.1  11.0  10.6   Hemoglobin 13.0 - 17.0 g/dL 40.9  81.1  91.4   Hematocrit 39.0 - 52.0 % 42.9  40.6  43.8   Platelets 150 - 400 K/uL 406  340.0  183    .    Latest Ref Rng & Units 09/18/2022    5:30 PM 09/18/2022    3:12 PM 08/21/2022   12:11 PM  CMP  Glucose 70 - 99 mg/dL  782  956   BUN 8 - 23 mg/dL  15  9   Creatinine 2.13 - 1.24 mg/dL  0.86  5.78   Sodium 469 - 145 mmol/L  132  137   Potassium 3.5 - 5.1 mmol/L  5.0  4.9   Chloride 98 - 111 mmol/L  95  99   CO2 22 - 32 mmol/L  24  28   Calcium 8.9 - 10.3 mg/dL  9.0  9.0   Total Protein 6.5 - 8.1 g/dL 7.0   6.9   Total Bilirubin 0.3 - 1.2 mg/dL 1.2   0.9   Alkaline Phos 38 - 126 U/L 77   87   AST 15 - 41 U/L 32   16   ALT 0 - 44 U/L 15   8       RADIOGRAPHIC STUDIES: I have personally reviewed the radiological images as listed and agreed with the findings in the report. CT ABDOMEN PELVIS W CONTRAST  Result Date: 09/18/2022 CLINICAL DATA:  Colon cancer, anorexia, weakness, fever EXAM: CT ABDOMEN AND PELVIS WITH CONTRAST TECHNIQUE: Multidetector CT imaging of the abdomen and pelvis was performed using the standard protocol following bolus administration of intravenous contrast. RADIATION DOSE REDUCTION: This exam was performed according to the departmental dose-optimization program which includes automated exposure control, adjustment of the mA and/or kV according to patient size and/or use of iterative reconstruction technique. CONTRAST:  OMNIPAQUE IOHEXOL 300 MG/ML  SOLN COMPARISON:  08/12/2022 FINDINGS: Lower chest: There are 2 left lower lobe pulmonary nodules consistent with progressive pulmonary metastases. 1.4 cm nodule image 12/2 and a 0.8 cm nodule  image 15/2. No airspace disease or effusion. Hepatobiliary: Gallbladder is moderately distended without  cholelithiasis or cholecystitis. Stable cysts are seen within the liver. Since the prior exam, there is loss of normal fat plane between the inferior right lobe liver and and enlarging colonic mass at the hepatic flexure. Direct invasion of the liver cannot be excluded. Pancreas: Unremarkable. No pancreatic ductal dilatation or surrounding inflammatory changes. Spleen: Normal in size without focal abnormality. Adrenals/Urinary Tract: Bilateral adrenal masses seen previously have enlarged, consistent with metastatic disease. Right adrenal mass measures up to 5.1 cm, previously 3.5 cm. The left adrenal mass measures up to 4.7 cm, previously 3.5 cm. The kidneys enhance normally and symmetrically. No urinary tract calculi or obstructive uropathy. Bladder is unremarkable. Stomach/Bowel: A large necrotic mass at the hepatic flexure of the colon has increased in size, measuring up to 12.5 x 10.1 cm. There is increasing pericolonic fat stranding and mesenteric nodularity consistent with locally invasive disease. As discussed above, there is loss of normal fat plane between the hepatic mass and the inferior margin right lobe liver and direct invasion of the liver cannot be excluded. There is no evidence of bowel obstruction or ileus. Normal appendix right lower quadrant. Vascular/Lymphatic: Increase in the size and number of mesenteric lymph nodes surrounding the hepatic flexure mass, consistent with local nodal metastases. Largest lymph node measures 0.8 cm reference image 56/2. There is also a soft tissue mass contiguous with the left crus of the diaphragm, which appears separate from the enlarging left adrenal mass. This measures 3.5 x 3.0 cm, new since prior study and consistent with metastatic deposit. Stable atherosclerosis of the aorta and its branches. Reproductive: Prostate is unremarkable. Other: Trace pelvic  free fluid. No free intraperitoneal gas. No abdominal wall hernia. Musculoskeletal: There are no acute or destructive bony abnormalities. Reconstructed images demonstrate no additional findings. IMPRESSION: 1. Enlarging mass at the hepatic flexure of the colon consistent with progressive colonic malignancy. There is increasing pericolonic fat stranding with loss of fat plane between the mass and the right lobe liver concerning for direct hepatic involvement. 2. Increasing mesenteric adenopathy, new soft tissue mass along the crus of the left hemidiaphragm, and enlarging bilateral adrenal masses consistent with progressive intra-abdominal metastases. 3. New and enlarging left lower lobe pulmonary nodules consistent with progressive pulmonary metastatic disease. 4. Trace pelvic free fluid. 5.  Aortic Atherosclerosis (ICD10-I70.0). Electronically Signed   By: Sharlet Salina M.D.   On: 09/18/2022 18:46   DG Chest Port 1 View  Result Date: 09/18/2022 CLINICAL DATA:  Fever and vomiting EXAM: PORTABLE CHEST 1 VIEW COMPARISON:  Radiograph 09/04/2016 and CT chest 08/22/2022 FINDINGS: Stable cardiomediastinal silhouette. Aortic atherosclerotic calcification. No focal consolidation, pleural effusion, or pneumothorax. No displaced rib fractures. IMPRESSION: No active disease. Electronically Signed   By: Minerva Fester M.D.   On: 09/18/2022 17:37   CT CHEST W CONTRAST  Result Date: 08/22/2022 CLINICAL DATA:  Colon cancer. Elevated CEA levels. Chronic cough. * Tracking Code: BO * EXAM: CT CHEST WITH CONTRAST TECHNIQUE: Multidetector CT imaging of the chest was performed during intravenous contrast administration. RADIATION DOSE REDUCTION: This exam was performed according to the departmental dose-optimization program which includes automated exposure control, adjustment of the mA and/or kV according to patient size and/or use of iterative reconstruction technique. CONTRAST:  OMNIPAQUE IOHEXOL 300 MG/ML  SOLN  COMPARISON:  Abdominopelvic CT 08/12/2022. Chest radiographs 09/04/2016. FINDINGS: Cardiovascular: No acute vascular findings. Diffuse atherosclerosis of the aorta, great vessels and coronary arteries. The heart size is normal. There is no pericardial effusion. Mediastinum/Nodes: There are no enlarged  mediastinal, hilar or axillary lymph nodes. The thyroid gland, trachea and esophagus demonstrate no significant findings. Lungs/Pleura: No pleural effusion or pneumothorax. Mild central airway thickening and scattered subpleural reticulation in both lungs. There are scattered calcified granulomas. Along the left hemidiaphragm, there is a subpleural, noncalcified 6 mm nodule on image 111/7. There is a small perifissural nodule along the superior aspect of the left major fissure on image 69/7. No highly suspicious nodules. Upper abdomen: No acute findings are seen within the visualized upper abdomen. Scattered hepatic cysts are stable compared with the recent abdominal CT. Likewise, the previously demonstrated bilateral adrenal nodules are unchanged, not further characterized by this study which was performed with contrast. The right adrenal nodule measures up to 3.9 cm on image 129/2 and has a density of 47 HU. The left nodule measures 3.6 cm and has a density of 44 HU. Previously demonstrated right colonic mass is not imaged. Musculoskeletal/Chest wall: There is no chest wall mass or suspicious osseous finding. Multilevel thoracic spondylosis. IMPRESSION: 1. No definite evidence of metastatic disease in the chest. 2. Scattered small calcified and noncalcified pulmonary nodules are likely benign. Recommend attention on follow-up. 3. Stable indeterminate bilateral adrenal nodules from recent abdominal CT, not further characterized by this study. Recommend further evaluation with adrenal protocol CT or MRI (without and with contrast). Alternatively, PET-CT could be considered. 4.  Aortic Atherosclerosis (ICD10-I70.0).  Electronically Signed   By: Carey Bullocks M.D.   On: 08/22/2022 14:33    ASSESSMENT & PLAN:   67 y.o. male with:  Newly diagnosed stage IVb large B-cell lymphoma Large hepatic flexure mass from large B-cell lymphoma with impending obstruction and high risk for perforation. 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  Patient is a lightheadedness and some orthostatic symptoms and severe fatigue.  Rule out adrenal insufficiency. Failure to thrive due to poor p.o. intake bowel issues and new diagnosis of large B-cell lymphoma. Dehydration due to poor p.o. intake Significant abdominal discomfort from his large colonic mass with impending obstruction. Arthritis Dyslipidemia History of gout  PLAN:  -Discussed lab results from 09/18/2022 in detail with patient. CBC showed WBC of 12.1K, hemoglobin of 13.2, and platelets of 406K. -Discussed results of 09/18/2022 CT abdomen/pelvis which showed enlarged mass at hepatic flexure of the colon with increasing pericolonic fat stranding with loss of fat plane between the mass and the right lobe liver.  Bilateral adrenal gland metastases and left lower lobe pulmonary metastases as well as mesenteric adenopathy. -biopsy of colonoscopy with GI on 09/05/2022 showed diffuse large B cell lymphoma ABC type. -Will order high risk lymphoma FISH panel -patient was seen by Elenora Gamma, MD on 09/16/2022 -educated patient on diffuse large B-cell non-hodgkin's lymphoma  -discussed goal to initiate treatment as soon as possible -Discussed goal to reduce the risk of perforation as well as address his impending obstruction since the right hepatic flexure tumor is very large and is involving significant thickness of the bowel.  Also duodenal involvement could be at risk of ulceration/perforation. -High risk of bleeding we will hold off on VTE  prophylaxis at this time with Lovenox and will only do SCDs. -discussed details of staging of lymphoma -patient does endorse stage 4 disease, which is still treated with curative intent. -discussed goal to optimize nutrition  -will start steroids to improve adrenal insufficiency and to try to start shrinking the lymphoma. -would recommend patient to receive inpatient  treatment to speed up initiation process of receiving treatment and since he cannot function at home with his current symptoms. -Pain management as per hospital medicine -Laxative management as per hospital medicine -Will start the patient on Solu-Medrol 125 mg IV every 12 hours. -Hope to give him Rituxan tomorrow. -Will need to get Port-A-Cath and echo prior to his [facility] chemotherapy. -Will plan to start [facility] from 09/24/2022 to have some time after Rituxan to reduce risk of perforation. -High risk of tumor lysis syndrome and started on allopurinol 100 mg p.o. twice daily. -Labs today including hepatitis testing and HIV testing and then daily labs to monitor for tumor lysis. -Checking cortisol to evaluate for adrenal insufficiency. -Discussed EPOCH-R chemotherapy in detail with the patient and his wife and they are agreeable to proceed. -PET CT scan for initial staging Hope this can be done as inpatient if possible to have accurate data prior to initiating [facility] -answered all of patient's and his wife's questions regarding details of diffuse large B-cell lymphoma and treatment process -Will cover empirically with antibiotics due to fat stranding around the large hepatic flexure mass which could suggest possible early infection.  If any fevers might need to escalate antibiotics. -Appreciate hospital medicine cares by Dr. Margo Aye FOLLOW-UP: Patient direct admitted to the hospitalist service on the oncology floor.  Oncology will continue to follow daily  The total time spent in the appointment was 85 minutes* .  All of the patient's  questions were answered with apparent satisfaction. The patient knows to call the clinic with any problems, questions or concerns.   Wyvonnia Lora MD MS AAHIVMS [facility] Hematology/Oncology Physician [facility]  .*Total Encounter Time as defined by the Centers for Medicare and Medicaid Services includes, in addition to the face-to-face time of a patient visit (documented in the note above) non-face-to-face time: obtaining and reviewing outside history, ordering and reviewing medications, tests or procedures, care coordination (communications with other health care professionals or caregivers) and documentation in the medical record.    I,Mitra Faeizi,acting as a Neurosurgeon for Wyvonnia Lora, MD.,have documented all relevant documentation on the behalf of Wyvonnia Lora, MD,as directed by  Wyvonnia Lora, MD while in the presence of Wyvonnia Lora, MD.  .I have reviewed the above documentation for accuracy and completeness, and I agree with the above. [name] MD

## 2022-09-19 NOTE — Progress Notes (Signed)
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 

## 2022-09-19 NOTE — H&P (Addendum)
History and Physical  Justin Cameron ZOX:[id number] DOB: Dec 20, 1955 DOA: 09/19/2022  Referring physician: Accepted by Dr. Humberto Leep, Hospitalist service  PCP: Daisy Floro, MD  Outpatient Specialists: Hematology/Oncology Patient coming from: Home through the cancer center  Chief Complaint: Abdominal pain, poor oral intake.  HPI: Justin Cameron is a 67 y.o. male with medical history significant for OSA, newly diagnosed diffuse large B-cell lymphoma who initially presented to [facility] medical oncology clinic with complaints of intermittent and progressive abdominal discomfort, generalized fatigue, night sweats, poor oral intake, unintentional weight loss of 30 pounds since February 2024.  Associated with constipation.  No GI bleed.  The patient has been followed by [facility] and had a colonoscopy on 08/21/2022 which was indeterminate, had a repeat colonoscopy on 09/05/2022 which showed an aggressive large B-cell lymphoma.  The patient was seen by general surgery Dr. Maisie Fus, recommended medical oncology and no role for surgery.    The patient presented to [facility] ED on 09/18/2022 with complaints of night sweats and worsening abdominal pain, nausea and lightheadedness.  IV contrast CT scan of the abdomen and pelvis revealed the following findings: Enlarging mass at the hepatic flexure increased pericolonic fat stranding with loss of fat plane between the mass in the right lobe of the liver.  Increasing mesenteric lymphadenopathy and new soft tissue mass along the crus of the left diaphragm and enlarging bilateral adrenal masses consistent with progressive intra-abdominal metastases.  New and enlarging left lower lobe pulmonary nodules consistent with progressive metastatic pulmonary involvement.   The patient received IV fluid hydration in the ED and was discharged to follow-up with medical oncology.  Upon presentation to the medical oncology clinic today, the patient was noted to be in  moderate distress due to abdominal pain and bloating.  Sent to [facility] as a direct admit to initiate his therapy for large B-cell lymphoma and to further manage his symptomatology.  Admitted by [facility], hospitalist service to MedSurg unit as inpatient status.  Accepted by Dr. Sharolyn Douglas.  At the time of this visit the patient is alert and oriented x 4.  Complains of neck pain for which lidocaine was ordered.  Also complains of bloating with intermittent right upper abdominal pain.  Pain management in place.  Night sweats have been interfering with his sleep, home trazodone has not been as effective.  Will try a dose of Ambien for situational insomnia.   ED Course: Direct admit from medical oncology clinic.  Review of Systems: Review of systems as noted in the HPI. All other systems reviewed and are negative.   Past Medical History:  Diagnosis Date   Arthritis    High cholesterol    Past Surgical History:  Procedure Laterality Date   REPLACEMENT TOTAL KNEE  10/2018   ROTATOR CUFF REPAIR      Social History:  reports that he has never smoked. He has never used smokeless tobacco. He reports that he does not currently use alcohol. He reports that he does not currently use drugs.   No Known Allergies  Family History  Problem Relation Age of Onset   Atrial fibrillation Mother    Alzheimer's disease Mother    Leukemia Father    Diabetes Father    Colon cancer Neg Hx    Rectal cancer Neg Hx    Esophageal cancer Neg Hx    Stomach cancer Neg Hx       Prior to Admission medications   Medication Sig Start Date End  Date Taking? Authorizing Provider  ALPRAZolam (XANAX) 0.25 MG tablet Take 0.125-0.25 mg by mouth 3 (three) times daily.    [provider]  amoxicillin-clavulanate (AUGMENTIN) 875-125 MG tablet Take 1 tablet by mouth every 12 (twelve) hours. 09/18/22   Charlynne Pander, MD  colchicine 0.6 MG tablet Take 0.6 mg by mouth daily. 07/19/22   [provider]  dicyclomine (BENTYL) 20 MG tablet TAKE 1 TABLET(20 MG) BY MOUTH EVERY 6 HOURS Patient not taking: Reported on 09/19/2022 08/19/22   Jenel Lucks, MD  diphenhydramine-acetaminophen (TYLENOL PM) 25-500 MG TABS tablet Take 1 tablet by mouth at bedtime.    [provider]  diphenhydrAMINE-zinc acetate (BENADRYL EXTRA STRENGTH) cream Apply 1 Application topically 3 (three) times daily as needed for itching. 09/18/22   Charlynne Pander, MD  ondansetron (ZOFRAN) 4 MG tablet TAKE 1 TABLET(4 MG) BY MOUTH EVERY 8 HOURS AS NEEDED FOR NAUSEA OR VOMITING 08/19/22   Jenel Lucks, MD  simvastatin (ZOCOR) 20 MG tablet Take 20 mg by mouth.    [provider]  traZODone (DESYREL) 50 MG tablet Take 50-100 mg by mouth at bedtime as needed. 07/26/22   [provider]    Physical Exam: BP 110/66 (BP Location: Right Arm)   Pulse (!) 110   Temp 99.2 F (37.3 C) (Oral)   Resp 18   SpO2 91%   General: 67 y.o. year-old male well developed well nourished in no acute distress.  Alert and oriented x3. Cardiovascular: Regular rate and rhythm with no rubs or gallops.  No thyromegaly or JVD noted.  No lower extremity edema. 2/4 pulses in all 4 extremities. Respiratory: Clear to auscultation with no wheezes or rales. Good inspiratory effort. Abdomen: Moderately distended with normal bowel sounds x4 quadrants.  Tenderness right upper quadrant. Muskuloskeletal: No cyanosis, clubbing or edema noted bilaterally Neuro: CN II-XII intact, strength, sensation, reflexes Skin: No ulcerative lesions noted or rashes Psychiatry: Judgement and insight appear normal. Mood is appropriate for condition and setting          Labs on Admission:  Basic Metabolic Panel: Recent Labs  Lab 09/18/22 1512 09/19/22 1713  NA 132* 131*  K 5.0 4.4  CL 95* 94*  CO2 24 24  GLUCOSE 111* 99  BUN 15 14  CREATININE 0.92 0.89  CALCIUM 9.0 8.6*  MG  --  1.8  PHOS  --  3.7   Liver Function  Tests: Recent Labs  Lab 09/18/22 1730 09/19/22 1713  AST 32 29  ALT 15 13  ALKPHOS 77 66  BILITOT 1.2 0.9  PROT 7.0 6.6  ALBUMIN 3.0* 2.9*   No results for input(s): "LIPASE", "AMYLASE" in the last 168 hours. No results for input(s): "AMMONIA" in the last 168 hours. CBC: Recent Labs  Lab 09/18/22 1512 09/19/22 1713  WBC 12.1* 10.6*  NEUTROABS  --  8.1*  HGB 13.2 12.1*  HCT 42.9 39.9  MCV 79.7* 81.3  PLT 406* 336   Cardiac Enzymes: No results for input(s): "CKTOTAL", "CKMB", "CKMBINDEX", "TROPONINI" in the last 168 hours.  BNP (last 3 results) No results for input(s): "BNP" in the last 8760 hours.  ProBNP (last 3 results) No results for input(s): "PROBNP" in the last 8760 hours.  CBG: No results for input(s): "GLUCAP" in the last 168 hours.  Radiological Exams on Admission: CT ABDOMEN PELVIS W CONTRAST  Result Date: 09/18/2022 CLINICAL DATA:  Colon cancer, anorexia, weakness, fever EXAM: CT ABDOMEN AND PELVIS WITH CONTRAST TECHNIQUE: Multidetector  CT imaging of the abdomen and pelvis was performed using the standard protocol following bolus administration of intravenous contrast. RADIATION DOSE REDUCTION: This exam was performed according to the departmental dose-optimization program which includes automated exposure control, adjustment of the mA and/or kV according to patient size and/or use of iterative reconstruction technique. CONTRAST:  OMNIPAQUE IOHEXOL 300 MG/ML  SOLN COMPARISON:  08/12/2022 FINDINGS: Lower chest: There are 2 left lower lobe pulmonary nodules consistent with progressive pulmonary metastases. 1.4 cm nodule image 12/2 and a 0.8 cm nodule image 15/2. No airspace disease or effusion. Hepatobiliary: Gallbladder is moderately distended without cholelithiasis or cholecystitis. Stable cysts are seen within the liver. Since the prior exam, there is loss of normal fat plane between the inferior right lobe liver and and enlarging colonic mass at the hepatic  flexure. Direct invasion of the liver cannot be excluded. Pancreas: Unremarkable. No pancreatic ductal dilatation or surrounding inflammatory changes. Spleen: Normal in size without focal abnormality. Adrenals/Urinary Tract: Bilateral adrenal masses seen previously have enlarged, consistent with metastatic disease. Right adrenal mass measures up to 5.1 cm, previously 3.5 cm. The left adrenal mass measures up to 4.7 cm, previously 3.5 cm. The kidneys enhance normally and symmetrically. No urinary tract calculi or obstructive uropathy. Bladder is unremarkable. Stomach/Bowel: A large necrotic mass at the hepatic flexure of the colon has increased in size, measuring up to 12.5 x 10.1 cm. There is increasing pericolonic fat stranding and mesenteric nodularity consistent with locally invasive disease. As discussed above, there is loss of normal fat plane between the hepatic mass and the inferior margin right lobe liver and direct invasion of the liver cannot be excluded. There is no evidence of bowel obstruction or ileus. Normal appendix right lower quadrant. Vascular/Lymphatic: Increase in the size and number of mesenteric lymph nodes surrounding the hepatic flexure mass, consistent with local nodal metastases. Largest lymph node measures 0.8 cm reference image 56/2. There is also a soft tissue mass contiguous with the left crus of the diaphragm, which appears separate from the enlarging left adrenal mass. This measures 3.5 x 3.0 cm, new since prior study and consistent with metastatic deposit. Stable atherosclerosis of the aorta and its branches. Reproductive: Prostate is unremarkable. Other: Trace pelvic free fluid. No free intraperitoneal gas. No abdominal wall hernia. Musculoskeletal: There are no acute or destructive bony abnormalities. Reconstructed images demonstrate no additional findings. IMPRESSION: 1. Enlarging mass at the hepatic flexure of the colon consistent with progressive colonic malignancy. There is  increasing pericolonic fat stranding with loss of fat plane between the mass and the right lobe liver concerning for direct hepatic involvement. 2. Increasing mesenteric adenopathy, new soft tissue mass along the crus of the left hemidiaphragm, and enlarging bilateral adrenal masses consistent with progressive intra-abdominal metastases. 3. New and enlarging left lower lobe pulmonary nodules consistent with progressive pulmonary metastatic disease. 4. Trace pelvic free fluid. 5.  Aortic Atherosclerosis (ICD10-I70.0). Electronically Signed   By: Sharlet Salina M.D.   On: 09/18/2022 18:46   DG Chest Port 1 View  Result Date: 09/18/2022 CLINICAL DATA:  Fever and vomiting EXAM: PORTABLE CHEST 1 VIEW COMPARISON:  Radiograph 09/04/2016 and CT chest 08/22/2022 FINDINGS: Stable cardiomediastinal silhouette. Aortic atherosclerotic calcification. No focal consolidation, pleural effusion, or pneumothorax. No displaced rib fractures. IMPRESSION: No active disease. Electronically Signed   By: Minerva Fester M.D.   On: 09/18/2022 17:37    EKG: I independently viewed the EKG done and my findings are as followed:    Assessment/Plan Present  on Admission:  Large cell (diffuse) non-Hodgkin's lymphoma (HCC)  Principal Problem:   Large cell (diffuse) non-Hodgkin's lymphoma (HCC)  Newly diagnosed diffuse large B cell lymphoma Management per medical oncology Started treatment on 09/19/2022. Appreciate medical oncology's assistance.  Fat stranding seen in the colon on CT scan with concern for possible intra-abdominal infection Resume Zosyn started yesterday in the ED Continue to follow peripheral blood cultures  Hypovolemic hyponatremia Elevated lactic acid secondary to hypovolemia Serum sodium 131, lactic acid 2.4. Start gentle IV fluid hydration NS at 50 cc/h x 2 days. Trend lactic acid  OSA Resume home CPAP nightly  Moderate protein calorie malnutrition Albumin 2.9, BMI 27 30 pound weight loss since  February 2024. Encourage oral intake as tolerated  Chronic constipation, intractable nausea, night sweats, unintentional weight loss, likely secondary to newly diagnosed diffuse large B cell lymphoma Bowel regimen, IV antiemetics in place Encourage oral protein calorie intake as tolerated  Situational insomnia Prior to admission on trazodone with minimal effect Ambien nightly x 1  Chronic neck pain, POA Lidocaine patch Out of bed to chair every shift if okay with medical oncology    DVT prophylaxis: Subcu Lovenox daily  Code Status: Full code  Family Communication: Updated patient's wife at bedside.  Disposition Plan: Admitted to MedSurg unit  Consults called: Medical oncology  Admission status: Inpatient status.   Status is: Inpatient The patient requires at least 2 midnights for further evaluation and treatment of present condition.   Darlin Drop MD [facility] Pager [phone]  If 7PM-7AM, please contact night-coverage [email or web address] Password [facility]  09/19/2022, 7:03 PM

## 2022-09-19 NOTE — Progress Notes (Signed)
ON PATHWAY REGIMEN - Lymphoma and CLL  No Change  Continue With Treatment as Ordered.  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

## 2022-09-19 NOTE — Progress Notes (Signed)
Asked PT about Cpap, States he did use one in the past but no longer does.

## 2022-09-19 NOTE — Progress Notes (Signed)
Pharmacy Antibiotic Note  Justin Cameron is a 67 y.o. male admitted on 09/19/2022 with  concern for intra-abdominal infection .  Pharmacy has been consulted for piperacillin/tazobactam dosing.  Today, 09/19/22 WBC slightly elevated SCr WNL, CrCl ~80 mL/min  Pt received a dose of amox/clav at 1728 this evening. Will start piperacillin/tazobactam 12 hours after amox/clav dose.   Plan: Piperacillin/tazobactam 3.375 g IV q8h EI  Pharmacy to sign off. Please re-consult if needed.     Temp (24hrs), Avg:98.9 F (37.2 C), Min:98.3 F (36.8 C), Max:99.2 F (37.3 C)  Recent Labs  Lab 09/18/22 1512 09/18/22 1730 09/19/22 1713  WBC 12.1*  --  10.6*  CREATININE 0.92  --  0.89  LATICACIDVEN  --  2.4*  --     Estimated Creatinine Clearance: 80.1 mL/min (by C-G formula based on SCr of 0.89 mg/dL).    No Known Allergies   Cindi Carbon, PharmD 09/19/2022 7:40 PM

## 2022-09-20 ENCOUNTER — Ambulatory Visit: Payer: Medicare Other | Admitting: Physician Assistant

## 2022-09-20 ENCOUNTER — Other Ambulatory Visit: Payer: Self-pay

## 2022-09-20 ENCOUNTER — Other Ambulatory Visit: Payer: Medicare Other

## 2022-09-20 ENCOUNTER — Inpatient Hospital Stay (HOSPITAL_COMMUNITY): Payer: Medicare Other

## 2022-09-20 DIAGNOSIS — G893 Neoplasm related pain (acute) (chronic): Secondary | ICD-10-CM

## 2022-09-20 DIAGNOSIS — Z0189 Encounter for other specified special examinations: Secondary | ICD-10-CM | POA: Diagnosis not present

## 2022-09-20 DIAGNOSIS — C8330 Diffuse large B-cell lymphoma, unspecified site: Secondary | ICD-10-CM | POA: Diagnosis not present

## 2022-09-20 DIAGNOSIS — Z5111 Encounter for antineoplastic chemotherapy: Secondary | ICD-10-CM

## 2022-09-20 HISTORY — PX: IR IMAGING GUIDED PORT INSERTION: IMG5740

## 2022-09-20 LAB — MAGNESIUM: Magnesium: 2.2 mg/dL (ref 1.7–2.4)

## 2022-09-20 LAB — ECHOCARDIOGRAM COMPLETE
AR max vel: 3.46 cm2
AV Area VTI: 3.8 cm2
AV Area mean vel: 3.17 cm2
AV Mean grad: 2 mmHg
AV Peak grad: 3.6 mmHg
Ao pk vel: 0.95 m/s
Area-P 1/2: 3.08 cm2
Height: 68 in
MV M vel: 2.85 m/s
MV Peak grad: 32.5 mmHg
S' Lateral: 2.7 cm
Weight: 2846.4 oz

## 2022-09-20 LAB — CBC WITH DIFFERENTIAL/PLATELET
Abs Immature Granulocytes: 0.05 10*3/uL (ref 0.00–0.07)
Basophils Absolute: 0 10*3/uL (ref 0.0–0.1)
Basophils Relative: 0 %
Eosinophils Absolute: 0 10*3/uL (ref 0.0–0.5)
Eosinophils Relative: 0 %
HCT: 37.6 % — ABNORMAL LOW (ref 39.0–52.0)
Hemoglobin: 11.8 g/dL — ABNORMAL LOW (ref 13.0–17.0)
Immature Granulocytes: 1 %
Lymphocytes Relative: 8 %
Lymphs Abs: 0.5 10*3/uL — ABNORMAL LOW (ref 0.7–4.0)
MCH: 24.7 pg — ABNORMAL LOW (ref 26.0–34.0)
MCHC: 31.4 g/dL (ref 30.0–36.0)
MCV: 78.7 fL — ABNORMAL LOW (ref 80.0–100.0)
Monocytes Absolute: 0.2 10*3/uL (ref 0.1–1.0)
Monocytes Relative: 3 %
Neutro Abs: 5.7 10*3/uL (ref 1.7–7.7)
Neutrophils Relative %: 88 %
Platelets: 307 10*3/uL (ref 150–400)
RBC: 4.78 MIL/uL (ref 4.22–5.81)
RDW: 15.6 % — ABNORMAL HIGH (ref 11.5–15.5)
WBC: 6.4 10*3/uL (ref 4.0–10.5)
nRBC: 0 % (ref 0.0–0.2)

## 2022-09-20 LAB — HIV ANTIBODY (ROUTINE TESTING W REFLEX): HIV Screen 4th Generation wRfx: NONREACTIVE

## 2022-09-20 LAB — PHOSPHORUS: Phosphorus: 4.4 mg/dL (ref 2.5–4.6)

## 2022-09-20 LAB — COMPREHENSIVE METABOLIC PANEL
ALT: 15 U/L (ref 0–44)
AST: 24 U/L (ref 15–41)
Albumin: 3 g/dL — ABNORMAL LOW (ref 3.5–5.0)
Alkaline Phosphatase: 65 U/L (ref 38–126)
Anion gap: 11 (ref 5–15)
BUN: 14 mg/dL (ref 8–23)
CO2: 24 mmol/L (ref 22–32)
Calcium: 8.8 mg/dL — ABNORMAL LOW (ref 8.9–10.3)
Chloride: 100 mmol/L (ref 98–111)
Creatinine, Ser: 0.72 mg/dL (ref 0.61–1.24)
GFR, Estimated: >60 mL/min (ref >60)
Glucose, Bld: 171 mg/dL — ABNORMAL HIGH (ref 70–99)
Potassium: 4 mmol/L (ref 3.5–5.1)
Sodium: 135 mmol/L (ref 135–145)
Total Bilirubin: 0.9 mg/dL (ref 0.3–1.2)
Total Protein: 6.7 g/dL (ref 6.5–8.1)

## 2022-09-20 LAB — CULTURE, BLOOD (ROUTINE X 2): Special Requests: ADEQUATE

## 2022-09-20 LAB — URIC ACID: Uric Acid, Serum: 6.8 mg/dL (ref 3.7–8.6)

## 2022-09-20 MED ORDER — SODIUM CHLORIDE 0.9 % IV SOLN
375.0000 mg/m2 | Freq: Once | INTRAVENOUS | Status: AC
  Administered 2022-09-20: 700 mg via INTRAVENOUS
  Filled 2022-09-20: qty 50

## 2022-09-20 MED ORDER — FENTANYL CITRATE (PF) 100 MCG/2ML IJ SOLN
INTRAMUSCULAR | Status: AC
  Filled 2022-09-20: qty 2

## 2022-09-20 MED ORDER — FENTANYL CITRATE (PF) 100 MCG/2ML IJ SOLN
INTRAMUSCULAR | Status: AC | PRN
  Administered 2022-09-20 (×2): 50 ug via INTRAVENOUS

## 2022-09-20 MED ORDER — FAMOTIDINE IN NACL 20-0.9 MG/50ML-% IV SOLN
20.0000 mg | Freq: Once | INTRAVENOUS | Status: AC
  Administered 2022-09-20: 20 mg via INTRAVENOUS
  Filled 2022-09-20: qty 50

## 2022-09-20 MED ORDER — DIPHENHYDRAMINE HCL 25 MG PO CAPS
50.0000 mg | ORAL_CAPSULE | Freq: Once | ORAL | Status: AC
  Administered 2022-09-20: 50 mg via ORAL
  Filled 2022-09-20: qty 2

## 2022-09-20 MED ORDER — MONTELUKAST SODIUM 10 MG PO TABS
10.0000 mg | ORAL_TABLET | Freq: Once | ORAL | Status: AC
  Administered 2022-09-20: 10 mg via ORAL
  Filled 2022-09-20: qty 1

## 2022-09-20 MED ORDER — MIDAZOLAM HCL 2 MG/2ML IJ SOLN
INTRAMUSCULAR | Status: AC
  Filled 2022-09-20: qty 2

## 2022-09-20 MED ORDER — CEFAZOLIN SODIUM-DEXTROSE 2-4 GM/100ML-% IV SOLN
INTRAVENOUS | Status: AC | PRN
  Administered 2022-09-20: 2 g via INTRAVENOUS

## 2022-09-20 MED ORDER — LIDOCAINE-EPINEPHRINE 1 %-1:100000 IJ SOLN
20.0000 mL | Freq: Once | INTRAMUSCULAR | Status: AC
  Administered 2022-09-20: 20 mL

## 2022-09-20 MED ORDER — CEFAZOLIN SODIUM-DEXTROSE 2-4 GM/100ML-% IV SOLN
INTRAVENOUS | Status: AC
  Filled 2022-09-20: qty 100

## 2022-09-20 MED ORDER — SODIUM CHLORIDE 0.9 % IV SOLN: Freq: Once | INTRAVENOUS | Status: AC

## 2022-09-20 MED ORDER — METHYLPREDNISOLONE SODIUM SUCC 125 MG IJ SOLR
125.0000 mg | Freq: Once | INTRAMUSCULAR | Status: AC
  Administered 2022-09-20: 125 mg via INTRAVENOUS
  Filled 2022-09-20: qty 2

## 2022-09-20 MED ORDER — CHLORHEXIDINE GLUCONATE CLOTH 2 % EX PADS
6.0000 | MEDICATED_PAD | Freq: Every day | CUTANEOUS | Status: DC
  Administered 2022-09-20 – 2022-09-28 (×7): 6 via TOPICAL

## 2022-09-20 MED ORDER — LIDOCAINE-EPINEPHRINE 1 %-1:100000 IJ SOLN
INTRAMUSCULAR | Status: AC
  Filled 2022-09-20: qty 1

## 2022-09-20 MED ORDER — ACETAMINOPHEN 500 MG PO TABS
1000.0000 mg | ORAL_TABLET | Freq: Once | ORAL | Status: AC
  Administered 2022-09-20: 1000 mg via ORAL
  Filled 2022-09-20: qty 2

## 2022-09-20 MED ORDER — MIDAZOLAM HCL 2 MG/2ML IJ SOLN
INTRAMUSCULAR | Status: AC | PRN
  Administered 2022-09-20 (×2): 1 mg via INTRAVENOUS

## 2022-09-20 NOTE — Progress Notes (Signed)
Dr. Candise Che informed this charge nurse needs a bed on the oncology floor because he needs chemo today.  Transfer order placed with hospitalist, Dr. Jarvis Newcomer made aware as well as the primary nurse and patient.  Patient transferred to room 1620, report given.  .me

## 2022-09-20 NOTE — Progress Notes (Signed)
Mobility Specialist - Progress Note   09/20/22 1331  Mobility  Activity Ambulated with assistance in hallway  Level of Assistance Standby assist, set-up cues, supervision of patient - no hands on  Assistive Device None  Distance Ambulated (ft) 500 ft  Range of Motion/Exercises Active  Activity Response Tolerated well  Mobility Referral Yes  $Mobility charge 1 Mobility  Mobility Specialist Start Time (ACUTE ONLY) 0115  Mobility Specialist Stop Time (ACUTE ONLY) 0128  Mobility Specialist Time Calculation (min) (ACUTE ONLY) 13 min   Pt received in bed and agreed to mobility. Had no issues throughout session, pt returned to bed with all needs met, family and staff in room.  Marilynne Halsted Mobility Specialist

## 2022-09-20 NOTE — Progress Notes (Signed)
  Echocardiogram 2D Echocardiogram has been performed.  Justin Cameron Banker 09/20/2022, 12:32 PM

## 2022-09-20 NOTE — Progress Notes (Signed)
Proceed with Solu-Medrol Ruxience premedication. Hold 1800 Solu-Medrol dose this evening.  Anola Gurney [place], [place], BCPS, BCOP 09/20/2022 12:52 PM

## 2022-09-20 NOTE — Progress Notes (Signed)
  Transition of Care [facility]) Screening Note   Patient Details  Name: Justin Cameron Date of Birth: 15-Nov-1955   Transition of Care [facility]) CM/SW Contact:    Otelia Santee, LCSW Phone Number: 09/20/2022, 1:27 PM    Transition of Care Department [facility]) has reviewed patient and no TOC needs have been identified at this time. We will continue to monitor patient advancement through interdisciplinary progression rounds. If new patient transition needs arise, please place a TOC consult.

## 2022-09-20 NOTE — Progress Notes (Signed)
HEMATOLOGY/ONCOLOGY INPATIENT PROGRESS NOTE  Date of Service: 09/20/2022  Inpatient Attending: .Tyrone Nine, MD   SUBJECTIVE  Justin Cameron is a 67 y.o. 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  Patient was seen in oncologic f/u today. He complains of intermittent abdominal pain and reports that steroids did improve symptoms. He also endorses night sweats. Patient has not had a BM today. Notes he is starting to feel hungry. Some improved strength and was able to have a shower today. ECHO done today shows notmal EF Port a cath placed today. Receiving Rituxan when seen late afternoon. He is in better spirits overall.   OBJECTIVE:  NAD  PHYSICAL EXAMINATION: . Vitals:   09/19/22 1714 09/19/22 1931 09/20/22 0053 09/20/22 0540  BP: 110/66 94/61 103/64 100/63  Pulse: (!) 110 93 72 81  Resp: 18 20 16 16   Temp: 99.2 F (37.3 C) 98.3 F (36.8 C)  (!) 97.1 F (36.2 C)  TempSrc: Oral Oral    SpO2: 91% 95% 97% 94%   GENERAL:alert, in no acute distress and comfortable SKIN: skin color, texture, turgor are normal, no rashes or significant lesions EYES: normal, conjunctiva are pink and non-injected, sclera clear OROPHARYNX:no exudate, no erythema and lips, buccal mucosa, and tongue normal  NECK: supple, no JVD, thyroid normal size, non-tender, without nodularity LYMPH:  no palpable lymphadenopathy in the cervical, axillary or inguinal LUNGS: clear to auscultation with normal respiratory effort HEART: regular rate & rhythm,  no murmurs and no lower extremity edema ABDOMEN: abdomen soft, TTP pver epigastric and Ruq, no guarding/rigidity/rebound. Musculoskeletal: no cyanosis of digits and  no clubbing  PSYCH: alert & oriented x 3 with fluent speech NEURO: no focal motor/sensory deficits  MEDICAL HISTORY:  Past Medical History:  Diagnosis Date   Arthritis    High cholesterol     SURGICAL HISTORY: Past Surgical History:  Procedure Laterality Date   REPLACEMENT TOTAL KNEE  10/2018   ROTATOR CUFF REPAIR      SOCIAL HISTORY: Social History   Socioeconomic History   Marital status: Married    Spouse name: Not on file   Number of children: Not on file   Years of education: Not on file   Highest education level: Not on file  Occupational History   Not on file  Tobacco Use   Smoking status: Never   Smokeless tobacco: Never  Vaping Use   Vaping Use: Never used  Substance and Sexual Activity   Alcohol use: Not Currently   Drug use: Not Currently   Sexual activity: Not on file  Other Topics Concern   Not on file  Social History Narrative   Not on file   Social Determinants of Health   Financial Resource Strain: Not on file  Food Insecurity: Not on file  Transportation Needs: Not on file  Physical Activity: Not on file  Stress: Not on file  Social Connections: Not on file  Intimate Partner Violence: Not on file    FAMILY HISTORY: Family History  Problem Relation Age of Onset   Atrial fibrillation Mother    Alzheimer's disease Mother    Leukemia Father    Diabetes Father    Colon cancer Neg Hx    Rectal cancer Neg Hx    Esophageal  cancer Neg Hx    Stomach cancer Neg Hx     ALLERGIES:  has No Known Allergies.  MEDICATIONS:  Scheduled Meds:  allopurinol  100 mg Oral BID   lidocaine  1 patch Transdermal Q24H   methylPREDNISolone (SOLU-MEDROL) injection  125 mg Intravenous Q12H   pantoprazole (PROTONIX) IV  40 mg Intravenous Q24H   senna-docusate  2 tablet Oral BID   Continuous Infusions:  sodium chloride 50 mL/hr at 09/20/22 0612   piperacillin-tazobactam (ZOSYN)  IV 3.375 g (09/20/22 0614)   PRN Meds:.diphenhydrAMINE, LORazepam,  oxyCODONE, polyethylene glycol, prochlorperazine, traZODone  REVIEW OF SYSTEMS:    10 Point review of Systems was done is negative except as noted above.   LABORATORY DATA:  I have reviewed the data as listed  .    Latest Ref Rng & Units 09/20/2022    5:31 AM 09/19/2022    5:13 PM 09/18/2022    3:12 PM  CBC  WBC 4.0 - 10.5 K/uL 6.4  10.6  12.1   Hemoglobin 13.0 - 17.0 g/dL 28.4  13.2  44.0   Hematocrit 39.0 - 52.0 % 37.6  39.9  42.9   Platelets 150 - 400 K/uL 307  336  406     .    Latest Ref Rng & Units 09/20/2022    5:31 AM 09/19/2022    5:13 PM 09/18/2022    5:30 PM  CMP  Glucose 70 - 99 mg/dL 102  99    BUN 8 - 23 mg/dL 14  14    Creatinine 7.25 - 1.24 mg/dL 3.66  4.40    Sodium 347 - 145 mmol/L 135  131    Potassium 3.5 - 5.1 mmol/L 4.0  4.4    Chloride 98 - 111 mmol/L 100  94    CO2 22 - 32 mmol/L 24  24    Calcium 8.9 - 10.3 mg/dL 8.8  8.6    Total Protein 6.5 - 8.1 g/dL 6.7  6.6  7.0   Total Bilirubin 0.3 - 1.2 mg/dL 0.9  0.9  1.2   Alkaline Phos 38 - 126 U/L 65  66  77   AST 15 - 41 U/L 24  29  32   ALT 0 - 44 U/L 15  13  15       RADIOGRAPHIC STUDIES: I have personally reviewed the radiological images as listed and agreed with the findings in the report. IR IMAGING GUIDED PORT INSERTION  Result Date: 09/20/2022 INDICATION: Colonic mass.  New diagnosis of lymphoma. EXAM: IMPLANTED PORT A CATH PLACEMENT WITH ULTRASOUND AND FLUOROSCOPIC GUIDANCE MEDICATIONS: Ancef 2 gm IV; The antibiotic was administered within an appropriate time interval prior to skin puncture. ANESTHESIA/SEDATION: Moderate (conscious) sedation was employed during this procedure. A total of Versed 2 mg and Fentanyl 100 mcg was administered intravenously. Moderate Sedation Time: 20 minutes. The patient's level of consciousness and vital signs were monitored continuously by radiology nursing throughout the procedure under my direct supervision. FLUOROSCOPY TIME:  Fluoroscopic dose; 0 mGy  COMPLICATIONS: None immediate. PROCEDURE: The procedure, risks, benefits, and alternatives were explained to the patient. Questions regarding the procedure were encouraged and answered. The patient understands and consents to the procedure. The RIGHT neck and chest were prepped with chlorhexidine in a sterile fashion, and a sterile drape was applied covering the operative field. Maximum barrier sterile technique with sterile gowns and gloves were used for the procedure. A timeout was performed prior to the initiation of the procedure. Local anesthesia  was provided with 1% lidocaine with epinephrine. After creating a small venotomy incision, a micropuncture kit was utilized to access the internal jugular vein under direct, real-time ultrasound guidance. Ultrasound image documentation was performed. The microwire was kinked to measure appropriate catheter length. A subcutaneous port pocket was then created along the upper chest wall utilizing a combination of sharp and blunt dissection. The pocket was irrigated with sterile saline. A single lumen Non-ISP power injectable port was chosen for placement. The 8 Fr catheter was tunneled from the port pocket site to the venotomy incision. The port was placed in the pocket. The external catheter was trimmed to appropriate length. At the venotomy, an 8 Fr peel-away sheath was placed over a guidewire under fluoroscopic guidance. The catheter was then placed through the sheath and the sheath was removed. Final catheter positioning was confirmed and documented with a fluoroscopic spot radiograph. The port was accessed with a Huber needle, aspirated and flushed with heparinized saline. The port pocket incision was closed with interrupted 3-0 Vicryl suture then Dermabond was applied, including at the venotomy incision. Dressings were placed. The patient tolerated the procedure well without immediate post procedural complication. IMPRESSION: Successful placement of a RIGHT internal  jugular approach power injectable Port-A-Cath. The tip of the catheter is positioned within the proximal RIGHT atrium. The catheter is ready for immediate use. Roanna Banning, MD [facility] Radiology Electronically Signed   By: Roanna Banning M.D.   On: 09/20/2022 14:44   ECHOCARDIOGRAM COMPLETE  Result Date: 09/20/2022    ECHOCARDIOGRAM REPORT   Patient Name:   Justin Cameron Date of Exam: 09/20/2022 Medical Rec #:  [id number]           Height:       68.0 in Accession #:    [id number]          Weight:       177.9 lb Date of Birth:  [birth year]            BSA:          1.945 m Patient Age:    66 years            BP:           100/63 mmHg Patient Gender: M                   HR:           80 bpm. Exam Location:  Inpatient Procedure: 2D Echo, Cardiac Doppler and Color Doppler Indications:    Chemo  History:        Patient has no prior history of Echocardiogram examinations.  Sonographer:    Lucy Antigua Referring Phys: [id number] [name]  Sonographer Comments: Strain was attempted. Echo machine @ [facility] doesn't have strain Neurosurgeon. IMPRESSIONS  1. Left ventricular ejection fraction, by estimation, is 55%. The left ventricle has low normal function. The left ventricle has no regional wall motion abnormalities.  2. Right ventricular systolic function is normal. The right ventricular size is normal. There is normal pulmonary artery systolic pressure.  3. The mitral valve is normal in structure. Trivial mitral valve regurgitation. No evidence of mitral stenosis.  4. The aortic valve is normal in structure. Aortic valve regurgitation is not visualized. No aortic stenosis is present.  5. The inferior vena cava is normal in size with greater than 50% respiratory variability, suggesting right atrial pressure of 3 mmHg. FINDINGS  Left Ventricle: Left ventricular ejection  fraction, by estimation, is 50 to 55%. The left ventricle has low normal function. The left  ventricle has no regional wall motion abnormalities. The left ventricular internal cavity size was normal in size. There is borderline left ventricular hypertrophy. Left ventricular diastolic parameters were normal. Right Ventricle: The right ventricular size is normal. No increase in right ventricular wall thickness. Right ventricular systolic function is normal. There is normal pulmonary artery systolic pressure. The tricuspid regurgitant velocity is 1.94 m/s, and  with an assumed right atrial pressure of 3 mmHg, the estimated right ventricular systolic pressure is 18.1 mmHg. Left Atrium: Left atrial size was normal in size. Right Atrium: Right atrial size was normal in size. Pericardium: There is no evidence of pericardial effusion. Mitral Valve: The mitral valve is normal in structure. Trivial mitral valve regurgitation. No evidence of mitral valve stenosis. Tricuspid Valve: The tricuspid valve is normal in structure. Tricuspid valve regurgitation is trivial. No evidence of tricuspid stenosis. Aortic Valve: The aortic valve is normal in structure. Aortic valve regurgitation is not visualized. No aortic stenosis is present. Aortic valve mean gradient measures 2.0 mmHg. Aortic valve peak gradient measures 3.6 mmHg. Aortic valve area, by VTI measures 3.80 cm. Pulmonic Valve: The pulmonic valve was normal in structure. Pulmonic valve regurgitation is not visualized. No evidence of pulmonic stenosis. Aorta: The aortic root is normal in size and structure. Venous: The inferior vena cava is normal in size with greater than 50% respiratory variability, suggesting right atrial pressure of 3 mmHg. IAS/Shunts: No atrial level shunt detected by color flow Doppler.  LEFT VENTRICLE PLAX 2D LVIDd:         3.60 cm   Diastology LVIDs:         2.70 cm   LV e' medial:    10.00 cm/s LV PW:         1.20 cm   LV E/e' medial:  4.6 LV IVS:        1.00 cm   LV e' lateral:   9.36 cm/s LVOT diam:     2.20 cm   LV E/e' lateral: 4.9 LV SV:          74 LV SV Index:   38 LVOT Area:     3.80 cm  RIGHT VENTRICLE RV S prime:     10.90 cm/s TAPSE (M-mode): 1.5 cm LEFT ATRIUM             Index        RIGHT ATRIUM           Index LA Vol (A2C):   61.4 ml 31.58 ml/m  RA Area:     13.90 cm LA Vol (A4C):   58.4 ml 30.03 ml/m  RA Volume:   32.80 ml  16.87 ml/m LA Biplane Vol: 59.6 ml 30.65 ml/m  AORTIC VALVE AV Area (Vmax):    3.46 cm AV Area (Vmean):   3.17 cm AV Area (VTI):     3.80 cm AV Vmax:           94.70 cm/s AV Vmean:          64.800 cm/s AV VTI:            0.195 m AV Peak Grad:      3.6 mmHg AV Mean Grad:      2.0 mmHg LVOT Vmax:         86.20 cm/s LVOT Vmean:        54.000 cm/s LVOT VTI:  0.195 m LVOT/AV VTI ratio: 1.00  AORTA Ao Root diam: 2.90 cm Ao Asc diam:  3.60 cm MITRAL VALVE               TRICUSPID VALVE MV Area (PHT): 3.08 cm    TR Peak grad:   15.1 mmHg MV Decel Time: 246 msec    TR Vmax:        194.00 cm/s MR Peak grad: 32.5 mmHg MR Vmax:      285.00 cm/s  SHUNTS MV E velocity: 46.30 cm/s  Systemic VTI:  0.20 m MV A velocity: 61.30 cm/s  Systemic Diam: 2.20 cm MV E/A ratio:  0.76 Aditya Sabharwal Electronically signed by Dorthula Nettles Signature Date/Time: 09/20/2022/1:15:18 PM    Final    CT ABDOMEN PELVIS W CONTRAST  Result Date: 09/18/2022 CLINICAL DATA:  Colon cancer, anorexia, weakness, fever EXAM: CT ABDOMEN AND PELVIS WITH CONTRAST TECHNIQUE: Multidetector CT imaging of the abdomen and pelvis was performed using the standard protocol following bolus administration of intravenous contrast. RADIATION DOSE REDUCTION: This exam was performed according to the departmental dose-optimization program which includes automated exposure control, adjustment of the mA and/or kV according to patient size and/or use of iterative reconstruction technique. CONTRAST:  OMNIPAQUE IOHEXOL 300 MG/ML  SOLN COMPARISON:  08/12/2022 FINDINGS: Lower chest: There are 2 left lower lobe pulmonary nodules consistent with progressive  pulmonary metastases. 1.4 cm nodule image 12/2 and a 0.8 cm nodule image 15/2. No airspace disease or effusion. Hepatobiliary: Gallbladder is moderately distended without cholelithiasis or cholecystitis. Stable cysts are seen within the liver. Since the prior exam, there is loss of normal fat plane between the inferior right lobe liver and and enlarging colonic mass at the hepatic flexure. Direct invasion of the liver cannot be excluded. Pancreas: Unremarkable. No pancreatic ductal dilatation or surrounding inflammatory changes. Spleen: Normal in size without focal abnormality. Adrenals/Urinary Tract: Bilateral adrenal masses seen previously have enlarged, consistent with metastatic disease. Right adrenal mass measures up to 5.1 cm, previously 3.5 cm. The left adrenal mass measures up to 4.7 cm, previously 3.5 cm. The kidneys enhance normally and symmetrically. No urinary tract calculi or obstructive uropathy. Bladder is unremarkable. Stomach/Bowel: A large necrotic mass at the hepatic flexure of the colon has increased in size, measuring up to 12.5 x 10.1 cm. There is increasing pericolonic fat stranding and mesenteric nodularity consistent with locally invasive disease. As discussed above, there is loss of normal fat plane between the hepatic mass and the inferior margin right lobe liver and direct invasion of the liver cannot be excluded. There is no evidence of bowel obstruction or ileus. Normal appendix right lower quadrant. Vascular/Lymphatic: Increase in the size and number of mesenteric lymph nodes surrounding the hepatic flexure mass, consistent with local nodal metastases. Largest lymph node measures 0.8 cm reference image 56/2. There is also a soft tissue mass contiguous with the left crus of the diaphragm, which appears separate from the enlarging left adrenal mass. This measures 3.5 x 3.0 cm, new since prior study and consistent with metastatic deposit. Stable atherosclerosis of the aorta and its  branches. Reproductive: Prostate is unremarkable. Other: Trace pelvic free fluid. No free intraperitoneal gas. No abdominal wall hernia. Musculoskeletal: There are no acute or destructive bony abnormalities. Reconstructed images demonstrate no additional findings. IMPRESSION: 1. Enlarging mass at the hepatic flexure of the colon consistent with progressive colonic malignancy. There is increasing pericolonic fat stranding with loss of fat plane between the mass and the right lobe  liver concerning for direct hepatic involvement. 2. Increasing mesenteric adenopathy, new soft tissue mass along the crus of the left hemidiaphragm, and enlarging bilateral adrenal masses consistent with progressive intra-abdominal metastases. 3. New and enlarging left lower lobe pulmonary nodules consistent with progressive pulmonary metastatic disease. 4. Trace pelvic free fluid. 5.  Aortic Atherosclerosis (ICD10-I70.0). Electronically Signed   By: Sharlet Salina M.D.   On: 09/18/2022 18:46   DG Chest Port 1 View  Result Date: 09/18/2022 CLINICAL DATA:  Fever and vomiting EXAM: PORTABLE CHEST 1 VIEW COMPARISON:  Radiograph 09/04/2016 and CT chest 08/22/2022 FINDINGS: Stable cardiomediastinal silhouette. Aortic atherosclerotic calcification. No focal consolidation, pleural effusion, or pneumothorax. No displaced rib fractures. IMPRESSION: No active disease. Electronically Signed   By: Minerva Fester M.D.   On: 09/18/2022 17:37   CT CHEST W CONTRAST  Result Date: 08/22/2022 CLINICAL DATA:  Colon cancer. Elevated CEA levels. Chronic cough. * Tracking Code: BO * EXAM: CT CHEST WITH CONTRAST TECHNIQUE: Multidetector CT imaging of the chest was performed during intravenous contrast administration. RADIATION DOSE REDUCTION: This exam was performed according to the departmental dose-optimization program which includes automated exposure control, adjustment of the mA and/or kV according to patient size and/or use of iterative  reconstruction technique. CONTRAST:  OMNIPAQUE IOHEXOL 300 MG/ML  SOLN COMPARISON:  Abdominopelvic CT 08/12/2022. Chest radiographs 09/04/2016. FINDINGS: Cardiovascular: No acute vascular findings. Diffuse atherosclerosis of the aorta, great vessels and coronary arteries. The heart size is normal. There is no pericardial effusion. Mediastinum/Nodes: There are no enlarged mediastinal, hilar or axillary lymph nodes. The thyroid gland, trachea and esophagus demonstrate no significant findings. Lungs/Pleura: No pleural effusion or pneumothorax. Mild central airway thickening and scattered subpleural reticulation in both lungs. There are scattered calcified granulomas. Along the left hemidiaphragm, there is a subpleural, noncalcified 6 mm nodule on image 111/7. There is a small perifissural nodule along the superior aspect of the left major fissure on image 69/7. No highly suspicious nodules. Upper abdomen: No acute findings are seen within the visualized upper abdomen. Scattered hepatic cysts are stable compared with the recent abdominal CT. Likewise, the previously demonstrated bilateral adrenal nodules are unchanged, not further characterized by this study which was performed with contrast. The right adrenal nodule measures up to 3.9 cm on image 129/2 and has a density of 47 HU. The left nodule measures 3.6 cm and has a density of 44 HU. Previously demonstrated right colonic mass is not imaged. Musculoskeletal/Chest wall: There is no chest wall mass or suspicious osseous finding. Multilevel thoracic spondylosis. IMPRESSION: 1. No definite evidence of metastatic disease in the chest. 2. Scattered small calcified and noncalcified pulmonary nodules are likely benign. Recommend attention on follow-up. 3. Stable indeterminate bilateral adrenal nodules from recent abdominal CT, not further characterized by this study. Recommend further evaluation with adrenal protocol CT or MRI (without and with contrast).  Alternatively, PET-CT could be considered. 4.  Aortic Atherosclerosis (ICD10-I70.0). Electronically Signed   By: Carey Bullocks M.D.   On: 08/22/2022 14:33    ASSESSMENT & PLAN:   67 y.o. male with:  Newly diagnosed stage IVb large B-cell lymphoma Large hepatic flexure mass from large B-cell lymphoma with impending obstruction and high risk for perforation. 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  Patient is a lightheadedness and some orthostatic symptoms and severe fatigue.  Rule out adrenal insufficiency. Failure to thrive due to poor p.o. intake bowel issues and  new diagnosis of large B-cell lymphoma. Dehydration due to poor p.o. intake Significant abdominal discomfort from his large colonic mass with impending obstruction. Arthritis Dyslipidemia History of gout  PLAN: -labs reviewed with patient -ECHo done today showed normal EF of 55% -port a cath placed by IR -Patient getting Rituxan today -continue Allopurinol for TLS prophylaxis -Continue SOlumedrol 125mg  po daily -will plan to start [facility] from 5/28 to give patient some time for the bowels to heal. -on Zosyn for possible bowel infection -SCD and ambulation for VTE prophylaxis no lovenox due to risk of GI bleeding -PT evaluation -Dietician evaluation -on full liquid diet can transition gradually to soft foods. -updated patient and wife at bedside. -PET/CT inpatient vs outpatient  The total time spent in the appointment was 50 minutes* .  All of the patient's questions were answered with apparent satisfaction. The patient knows to call the clinic with any problems, questions or concerns.   Wyvonnia Lora MD MS AAHIVMS [facility] Hematology/Oncology Physician [facility]  .*Total Encounter Time as defined by the Centers for Medicare and Medicaid Services includes, in addition to the  face-to-face time of a patient visit (documented in the note above) non-face-to-face time: obtaining and reviewing outside history, ordering and reviewing medications, tests or procedures, care coordination (communications with other health care professionals or caregivers) and documentation in the medical record.    I,Mitra Faeizi,acting as a Neurosurgeon for No name on file.,have documented all relevant documentation on the behalf of No name on file,as directed by  No name on file while in the presence of No name on file.  .I have reviewed the above documentation for accuracy and completeness, and I agree with the above. Wyvonnia Lora MD MS

## 2022-09-20 NOTE — Progress Notes (Signed)
Chemo RN arrived to patient room to complete Rituxan infusion. Patient and family educated about Rituxan and s/s of reaction and side effects. Consent was signed and documented. Both patient and family verbalized understanding. Port placement verified and blood return noted. Transfusion initiated. Patient tolerated well without any issues. Vitals stable throughout infusion. Upon completion, port was flushed and saline locked. Patient without complaint at time of RN departure.

## 2022-09-20 NOTE — Consult Note (Signed)
Chief Complaint: Patient was seen in consultation today for port a cath placement  Referring Physician(s): [name]  Supervising Physician: Roanna Banning  Patient Status: [facility] - In-pt  History of Present Illness: Justin Cameron is a 67 y.o. male with PMH HLD, arthritis, OSA and newly diagnosed DLBCL who was admitted to [facility] from [facility] on 5/23 with persistent abd pain, fatigue, nausea, neck pain, lightheadedness, night sweats, weight loss, poor oral intake.  CT A/P on 5/22 revealed:   1. Enlarging mass at the hepatic flexure of the colon consistent with progressive colonic malignancy. There is increasing pericolonic fat stranding with loss of fat plane between the mass and the right lobe liver concerning for direct hepatic involvement. 2. Increasing mesenteric adenopathy, new soft tissue mass along the crus of the left hemidiaphragm, and enlarging bilateral adrenal masses consistent with progressive intra-abdominal metastases. 3. New and enlarging left lower lobe pulmonary nodules consistent with progressive pulmonary metastatic disease. 4. Trace pelvic free fluid. 5.  Aortic Atherosclerosis  Pt is afebrile, WBC nl, hgb 11.8, plts nl, creat nl, blood cx neg to date;  had colonoscopy/endoscopy 09/05/22; chemotherapy planned and request now received for port a cath placement to assist with treatment.   Past Medical History:  Diagnosis Date   Arthritis    High cholesterol     Past Surgical History:  Procedure Laterality Date   REPLACEMENT TOTAL KNEE  10/2018   ROTATOR CUFF REPAIR      Allergies: Patient has no known allergies.  Medications: Prior to Admission medications   Medication Sig Start Date End Date Taking? Authorizing Provider  ALPRAZolam (XANAX) 0.25 MG tablet Take 0.125-0.25 mg by mouth 3 (three) times daily as needed for anxiety.   Yes [provider]  colchicine 0.6 MG tablet Take 0.6 mg by mouth daily as needed (gout). 07/19/22   Yes [provider]  diphenhydramine-acetaminophen (TYLENOL PM) 25-500 MG TABS tablet Take 1 tablet by mouth at bedtime.   Yes [provider]  diphenhydrAMINE-zinc acetate (BENADRYL EXTRA STRENGTH) cream Apply 1 Application topically 3 (three) times daily as needed for itching. 09/18/22  Yes Charlynne Pander, MD  ondansetron (ZOFRAN) 4 MG tablet TAKE 1 TABLET(4 MG) BY MOUTH EVERY 8 HOURS AS NEEDED FOR NAUSEA OR VOMITING 08/19/22  Yes Jenel Lucks, MD  simvastatin (ZOCOR) 20 MG tablet Take 20 mg by mouth every evening.   Yes [provider]  traZODone (DESYREL) 50 MG tablet Take 50-100 mg by mouth at bedtime as needed for sleep. 07/26/22  Yes [provider]  amoxicillin-clavulanate (AUGMENTIN) 875-125 MG tablet Take 1 tablet by mouth every 12 (twelve) hours. 09/18/22   Charlynne Pander, MD  dicyclomine (BENTYL) 20 MG tablet TAKE 1 TABLET(20 MG) BY MOUTH EVERY 6 HOURS Patient not taking: Reported on 09/19/2022 08/19/22   Jenel Lucks, MD  traMADol (ULTRAM) 50 MG tablet Take 50 mg by mouth every 6 (six) hours as needed for moderate pain. 09/16/22 09/21/22  [provider]     Family History  Problem Relation Age of Onset   Atrial fibrillation Mother    Alzheimer's disease Mother    Leukemia Father    Diabetes Father    Colon cancer Neg Hx    Rectal cancer Neg Hx    Esophageal cancer Neg Hx    Stomach cancer Neg Hx     Social History   Socioeconomic History   Marital status: Married    Spouse name: Not on file  Number of children: Not on file   Years of education: Not on file   Highest education level: Not on file  Occupational History   Not on file  Tobacco Use   Smoking status: Never   Smokeless tobacco: Never  Vaping Use   Vaping Use: Never used  Substance and Sexual Activity   Alcohol use: Not Currently   Drug use: Not Currently   Sexual activity: Not on file  Other Topics Concern   Not on file  Social History  Narrative   Not on file   Social Determinants of Health   Financial Resource Strain: Not on file  Food Insecurity: Not on file  Transportation Needs: Not on file  Physical Activity: Not on file  Stress: Not on file  Social Connections: Not on file      Review of Systems see above  Vital Signs: BP 100/63 (BP Location: Right Arm)   Pulse 81   Temp (!) 97.1 F (36.2 C)   Resp 16   SpO2 94%   Advance Care Plan: no documents on file   Physical Exam: awake/alert; chest- CTA bilat; heart- RRR; abd- soft,+BS, tender rt sided abd region;  no LE edema  Imaging: CT ABDOMEN PELVIS W CONTRAST  Result Date: 09/18/2022 CLINICAL DATA:  Colon cancer, anorexia, weakness, fever EXAM: CT ABDOMEN AND PELVIS WITH CONTRAST TECHNIQUE: Multidetector CT imaging of the abdomen and pelvis was performed using the standard protocol following bolus administration of intravenous contrast. RADIATION DOSE REDUCTION: This exam was performed according to the departmental dose-optimization program which includes automated exposure control, adjustment of the mA and/or kV according to patient size and/or use of iterative reconstruction technique. CONTRAST:  OMNIPAQUE IOHEXOL 300 MG/ML  SOLN COMPARISON:  08/12/2022 FINDINGS: Lower chest: There are 2 left lower lobe pulmonary nodules consistent with progressive pulmonary metastases. 1.4 cm nodule image 12/2 and a 0.8 cm nodule image 15/2. No airspace disease or effusion. Hepatobiliary: Gallbladder is moderately distended without cholelithiasis or cholecystitis. Stable cysts are seen within the liver. Since the prior exam, there is loss of normal fat plane between the inferior right lobe liver and and enlarging colonic mass at the hepatic flexure. Direct invasion of the liver cannot be excluded. Pancreas: Unremarkable. No pancreatic ductal dilatation or surrounding inflammatory changes. Spleen: Normal in size without focal abnormality. Adrenals/Urinary Tract: Bilateral  adrenal masses seen previously have enlarged, consistent with metastatic disease. Right adrenal mass measures up to 5.1 cm, previously 3.5 cm. The left adrenal mass measures up to 4.7 cm, previously 3.5 cm. The kidneys enhance normally and symmetrically. No urinary tract calculi or obstructive uropathy. Bladder is unremarkable. Stomach/Bowel: A large necrotic mass at the hepatic flexure of the colon has increased in size, measuring up to 12.5 x 10.1 cm. There is increasing pericolonic fat stranding and mesenteric nodularity consistent with locally invasive disease. As discussed above, there is loss of normal fat plane between the hepatic mass and the inferior margin right lobe liver and direct invasion of the liver cannot be excluded. There is no evidence of bowel obstruction or ileus. Normal appendix right lower quadrant. Vascular/Lymphatic: Increase in the size and number of mesenteric lymph nodes surrounding the hepatic flexure mass, consistent with local nodal metastases. Largest lymph node measures 0.8 cm reference image 56/2. There is also a soft tissue mass contiguous with the left crus of the diaphragm, which appears separate from the enlarging left adrenal mass. This measures 3.5 x 3.0 cm, new since prior study and consistent  with metastatic deposit. Stable atherosclerosis of the aorta and its branches. Reproductive: Prostate is unremarkable. Other: Trace pelvic free fluid. No free intraperitoneal gas. No abdominal wall hernia. Musculoskeletal: There are no acute or destructive bony abnormalities. Reconstructed images demonstrate no additional findings. IMPRESSION: 1. Enlarging mass at the hepatic flexure of the colon consistent with progressive colonic malignancy. There is increasing pericolonic fat stranding with loss of fat plane between the mass and the right lobe liver concerning for direct hepatic involvement. 2. Increasing mesenteric adenopathy, new soft tissue mass along the crus of the left  hemidiaphragm, and enlarging bilateral adrenal masses consistent with progressive intra-abdominal metastases. 3. New and enlarging left lower lobe pulmonary nodules consistent with progressive pulmonary metastatic disease. 4. Trace pelvic free fluid. 5.  Aortic Atherosclerosis (ICD10-I70.0). Electronically Signed   By: Sharlet Salina M.D.   On: 09/18/2022 18:46   DG Chest Port 1 View  Result Date: 09/18/2022 CLINICAL DATA:  Fever and vomiting EXAM: PORTABLE CHEST 1 VIEW COMPARISON:  Radiograph 09/04/2016 and CT chest 08/22/2022 FINDINGS: Stable cardiomediastinal silhouette. Aortic atherosclerotic calcification. No focal consolidation, pleural effusion, or pneumothorax. No displaced rib fractures. IMPRESSION: No active disease. Electronically Signed   By: Minerva Fester M.D.   On: 09/18/2022 17:37   CT CHEST W CONTRAST  Result Date: 08/22/2022 CLINICAL DATA:  Colon cancer. Elevated CEA levels. Chronic cough. * Tracking Code: BO * EXAM: CT CHEST WITH CONTRAST TECHNIQUE: Multidetector CT imaging of the chest was performed during intravenous contrast administration. RADIATION DOSE REDUCTION: This exam was performed according to the departmental dose-optimization program which includes automated exposure control, adjustment of the mA and/or kV according to patient size and/or use of iterative reconstruction technique. CONTRAST:  OMNIPAQUE IOHEXOL 300 MG/ML  SOLN COMPARISON:  Abdominopelvic CT 08/12/2022. Chest radiographs 09/04/2016. FINDINGS: Cardiovascular: No acute vascular findings. Diffuse atherosclerosis of the aorta, great vessels and coronary arteries. The heart size is normal. There is no pericardial effusion. Mediastinum/Nodes: There are no enlarged mediastinal, hilar or axillary lymph nodes. The thyroid gland, trachea and esophagus demonstrate no significant findings. Lungs/Pleura: No pleural effusion or pneumothorax. Mild central airway thickening and scattered subpleural reticulation in both  lungs. There are scattered calcified granulomas. Along the left hemidiaphragm, there is a subpleural, noncalcified 6 mm nodule on image 111/7. There is a small perifissural nodule along the superior aspect of the left major fissure on image 69/7. No highly suspicious nodules. Upper abdomen: No acute findings are seen within the visualized upper abdomen. Scattered hepatic cysts are stable compared with the recent abdominal CT. Likewise, the previously demonstrated bilateral adrenal nodules are unchanged, not further characterized by this study which was performed with contrast. The right adrenal nodule measures up to 3.9 cm on image 129/2 and has a density of 47 HU. The left nodule measures 3.6 cm and has a density of 44 HU. Previously demonstrated right colonic mass is not imaged. Musculoskeletal/Chest wall: There is no chest wall mass or suspicious osseous finding. Multilevel thoracic spondylosis. IMPRESSION: 1. No definite evidence of metastatic disease in the chest. 2. Scattered small calcified and noncalcified pulmonary nodules are likely benign. Recommend attention on follow-up. 3. Stable indeterminate bilateral adrenal nodules from recent abdominal CT, not further characterized by this study. Recommend further evaluation with adrenal protocol CT or MRI (without and with contrast). Alternatively, PET-CT could be considered. 4.  Aortic Atherosclerosis (ICD10-I70.0). Electronically Signed   By: Carey Bullocks M.D.   On: 08/22/2022 14:33    Labs:  CBC: Recent Labs  08/21/22 1211 09/18/22 1512 09/19/22 1713 09/20/22 0531  WBC 11.0* 12.1* 10.6* 6.4  HGB 13.4 13.2 12.1* 11.8*  HCT 40.6 42.9 39.9 37.6*  PLT 340.0 406* 336 307    COAGS: Recent Labs    09/19/22 1713  INR 1.1  APTT 29    BMP: Recent Labs    08/21/22 1211 09/18/22 1512 09/19/22 1713 09/20/22 0531  NA 137 132* 131* 135  K 4.9 5.0 4.4 4.0  CL 99 95* 94* 100  CO2 28 24 24 24   GLUCOSE 102* 111* 99 171*  BUN 9 15 14 14    CALCIUM 9.0 9.0 8.6* 8.8*  CREATININE 0.99 0.92 0.89 0.72  GFRNONAA  --  >60 >60 >60    LIVER FUNCTION TESTS: Recent Labs    08/21/22 1211 09/18/22 1730 09/19/22 1713 09/20/22 0531  BILITOT 0.9 1.2 0.9 0.9  AST 16 32 29 24  ALT 8 15 13 15   ALKPHOS 87 77 66 65  PROT 6.9 7.0 6.6 6.7  ALBUMIN 3.6 3.0* 2.9* 3.0*    TUMOR MARKERS: Recent Labs    08/21/22 1211  CEA <2.0    Assessment and Plan: 67 y.o. male with PMH HLD, arthritis, OSA and newly diagnosed DLBCL who was admitted to [facility] from [facility] on 5/23 with persistent abd pain, fatigue, nausea, neck pain, lightheadedness, night sweats, weight loss, poor oral intake.  CT A/P on 5/22 revealed:   1. Enlarging mass at the hepatic flexure of the colon consistent with progressive colonic malignancy. There is increasing pericolonic fat stranding with loss of fat plane between the mass and the right lobe liver concerning for direct hepatic involvement. 2. Increasing mesenteric adenopathy, new soft tissue mass along the crus of the left hemidiaphragm, and enlarging bilateral adrenal masses consistent with progressive intra-abdominal metastases. 3. New and enlarging left lower lobe pulmonary nodules consistent with progressive pulmonary metastatic disease. 4. Trace pelvic free fluid. 5.  Aortic Atherosclerosis  Pt is afebrile, WBC nl, hgb 11.8, plts nl, creat nl, blood cx neg to date;  had colonoscopy/endoscopy 09/05/22; chemotherapy planned and request now received for port a cath placement to assist with treatment.Risks and benefits of image guided port-a-catheter placement was discussed with the patient including, but not limited to bleeding, infection, pneumothorax, or fibrin sheath development and need for additional procedures.  All of the patient's questions were answered, patient is agreeable to proceed. Consent signed and in chart. Procedure tent planned for later today.     Thank you for this interesting consult.  I  greatly enjoyed meeting Justin Cameron and look forward to participating in their care.  A copy of this report was sent to the requesting provider on this date.  Electronically Signed: D. Jeananne Rama, PA-C 09/20/2022, 11:07 AM   I spent a total of  25 minutes   in face to face in clinical consultation, greater than 50% of which was counseling/coordinating care for port a cath placement

## 2022-09-20 NOTE — Procedures (Signed)
Vascular and Interventional Radiology Procedure Note  Patient: Justin Cameron DOB: 03-Sep-1955 Medical Record Number: [id number] Note Date/Time: 09/20/22 1:52 PM   Performing Physician: Roanna Banning, MD Assistant(s): None  Diagnosis: Lymphoma  Procedure: PORT PLACEMENT  Anesthesia: Conscious Sedation Complications: None Estimated Blood Loss: Minimal  Findings:  Successful right-sided port placement, with the tip of the catheter in the proximal right atrium.  Plan: Catheter ready for use.  See detailed procedure note with images in PACS. The patient tolerated the procedure well without incident or complication and was returned to Floor Bed in stable condition.    Roanna Banning, MD [facility]   Pager. [phone] Clinic. [phone]

## 2022-09-20 NOTE — Progress Notes (Signed)
[facility] PROGRESS NOTE  [name] (DOB: [birth year]) ZOX:[id number] PCP: Daisy Floro, MD Outpatient Specialists: Oncology, Dr. Candise Che  Brief Narrative: Justin Cameron is a 67 y.o. male with a history of OSA, newly diagnosed metastatic DLBCL who [facility] on [facility]/2022 for ongoing abdominal pain, bloating, unintentional weight loss, night sweats. He had been seen in the ED 5/22 where CT showed an enlarging mass at the hepatic flexure, increased pericolonic ft stranding, increased mesenteric lymphadenopathy, enlarging bilateral adrenal masses, and new mass along crus of left diaphragm. There were also new and enlarging LLL pulmonary nodules. His symptoms were stable with IVF and he was discharged, but on return to the oncology clinic his symptoms seemed worse. He was referred for direct admission to expedite chemotherapy. Port placed 5/24, and chemo to start that afternoon.   Subjective: Ready to get started with chemotherapy. No chest pain or dyspnea. Abdominal bloating is stable, +flatus.   Objective: BP 107/63 (BP Location: Right Arm)   Pulse 87   Temp 97.9 F (36.6 C) (Oral)   Resp 18   SpO2 92%   Gen: No distress Pulm: Clear, nonlabored  CV: RRR, soft systolic murmur, no R or G, no JVD GI: Soft, protuberant, no fluid wave, ++BS Neuro: Alert and oriented. No new focal deficits. Ext: Warm, no deformities. 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 Then start aggressive chemotherapy with curative intent per oncology. Steroids per oncology.  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  - Colon mass is not obstructing at this time. Continue bowel regimen. Surgery, Dr. Maisie Fus, has recommended no surgery for this issue. - For pericolonic fat  stranding, leukocytosis, continue zosyn empirically.  - Oncology recommended against pharmacologic VTE ppx due to risk of bleeding, so encouraged patient to ambulate often, will order SCDs - Adrenal masses noted. No specific symptoms/signs of insufficiency, and AM cortisol adequate at 16.3.  - Pre Tx PET scan ordered, though I have never seen this covered by insurance on an inpatient.  - Pre Tx echocardiogram appears normal, no cardiomyopathy.   Hyponatremia, dehydration, lactic acidosis: Resolved.   Moderate protein calorie malnutrition:  - Supplement protein as able  Insomnia:  - Trazodone prn.   OSA:  - CPAP qHS  Tyrone Nine, MD [facility] [email or web address] 09/20/2022, 4:31 PM

## 2022-09-21 DIAGNOSIS — Z5111 Encounter for antineoplastic chemotherapy: Secondary | ICD-10-CM | POA: Diagnosis not present

## 2022-09-21 DIAGNOSIS — G893 Neoplasm related pain (acute) (chronic): Secondary | ICD-10-CM | POA: Diagnosis not present

## 2022-09-21 DIAGNOSIS — C8330 Diffuse large B-cell lymphoma, unspecified site: Secondary | ICD-10-CM | POA: Diagnosis not present

## 2022-09-21 LAB — CBC WITH DIFFERENTIAL/PLATELET
Abs Immature Granulocytes: 0.15 10*3/uL — ABNORMAL HIGH (ref 0.00–0.07)
Basophils Absolute: 0 10*3/uL (ref 0.0–0.1)
Basophils Relative: 0 %
Eosinophils Absolute: 0 10*3/uL (ref 0.0–0.5)
Eosinophils Relative: 0 %
HCT: 35 % — ABNORMAL LOW (ref 39.0–52.0)
Hemoglobin: 11.2 g/dL — ABNORMAL LOW (ref 13.0–17.0)
Immature Granulocytes: 1 %
Lymphocytes Relative: 2 %
Lymphs Abs: 0.3 10*3/uL — ABNORMAL LOW (ref 0.7–4.0)
MCH: 25.6 pg — ABNORMAL LOW (ref 26.0–34.0)
MCHC: 32 g/dL (ref 30.0–36.0)
MCV: 80.1 fL (ref 80.0–100.0)
Monocytes Absolute: 0.5 10*3/uL (ref 0.1–1.0)
Monocytes Relative: 3 %
Neutro Abs: 15.3 10*3/uL — ABNORMAL HIGH (ref 1.7–7.7)
Neutrophils Relative %: 94 %
Platelets: 316 10*3/uL (ref 150–400)
RBC: 4.37 MIL/uL (ref 4.22–5.81)
RDW: 15.9 % — ABNORMAL HIGH (ref 11.5–15.5)
WBC: 16.3 10*3/uL — ABNORMAL HIGH (ref 4.0–10.5)
nRBC: 0 % (ref 0.0–0.2)

## 2022-09-21 LAB — COMPREHENSIVE METABOLIC PANEL
ALT: 14 U/L (ref 0–44)
AST: 29 U/L (ref 15–41)
Albumin: 2.9 g/dL — ABNORMAL LOW (ref 3.5–5.0)
Alkaline Phosphatase: 62 U/L (ref 38–126)
Anion gap: 10 (ref 5–15)
BUN: 15 mg/dL (ref 8–23)
CO2: 25 mmol/L (ref 22–32)
Calcium: 8.8 mg/dL — ABNORMAL LOW (ref 8.9–10.3)
Chloride: 105 mmol/L (ref 98–111)
Creatinine, Ser: 0.78 mg/dL (ref 0.61–1.24)
GFR, Estimated: >60 mL/min (ref >60)
Glucose, Bld: 149 mg/dL — ABNORMAL HIGH (ref 70–99)
Potassium: 3.7 mmol/L (ref 3.5–5.1)
Sodium: 140 mmol/L (ref 135–145)
Total Bilirubin: 1.2 mg/dL (ref 0.3–1.2)
Total Protein: 6.6 g/dL (ref 6.5–8.1)

## 2022-09-21 LAB — CULTURE, BLOOD (ROUTINE X 2)
Culture: NO GROWTH
Culture: NO GROWTH

## 2022-09-21 LAB — URIC ACID: Uric Acid, Serum: 5.4 mg/dL (ref 3.7–8.6)

## 2022-09-21 MED ORDER — ENSURE ENLIVE PO LIQD
237.0000 mL | Freq: Two times a day (BID) | ORAL | Status: DC
  Administered 2022-09-23: 237 mL via ORAL

## 2022-09-21 MED ORDER — POLYETHYLENE GLYCOL 3350 17 G PO PACK
17.0000 g | PACK | Freq: Every day | ORAL | Status: DC
  Administered 2022-09-21 – 2022-09-23 (×3): 17 g via ORAL
  Filled 2022-09-21 (×3): qty 1

## 2022-09-21 MED ORDER — METHYLPREDNISOLONE SODIUM SUCC 125 MG IJ SOLR
125.0000 mg | INTRAMUSCULAR | Status: AC
  Administered 2022-09-22 – 2022-09-23 (×2): 125 mg via INTRAVENOUS
  Filled 2022-09-21 (×2): qty 2

## 2022-09-21 NOTE — Progress Notes (Signed)
PT Cancellation Note  Patient Details Name: Justin Cameron MRN: [id number] DOB: [birth year]   Cancelled Treatment:    Reason Eval/Treat Not Completed: PT screened, no needs identified, will sign off Pt ambulated to doorway and reports taking a shower this morning.  Pt does not appear to need skilled acute PT at this time and agreeable to continue mobilizing during hospitalization.  Discussed benefits of OPPT for cancer rehab if needed in the future (which he can discuss with MD if needed pending his course the cancer treatments).  However, no current PT needs at this time.   [name] 09/21/2022, 11:39 AM Paulino Door, DPT Physical Therapist Acute Rehabilitation Services Office: [phone]

## 2022-09-21 NOTE — Progress Notes (Signed)
[facility] PROGRESS NOTE  Justin Cameron (DOB: May 03, 1955) VWU:[id number] PCP: Daisy Floro, MD Outpatient Specialists: Oncology, Dr. Candise Che  Brief Narrative: Justin Cameron is a 67 y.o. male with a history of OSA, newly diagnosed metastatic DLBCL who presented to the Cancer Center on 09/19/2022 for ongoing abdominal pain, bloating, unintentional weight loss, night sweats. He had been seen in the ED 5/22 where CT showed an enlarging mass at the hepatic flexure, increased pericolonic ft stranding, increased mesenteric lymphadenopathy, enlarging bilateral adrenal masses, and new mass along crus of left diaphragm. There were also new and enlarging LLL pulmonary nodules. His symptoms were stable with IVF and he was discharged, but on return to the oncology clinic his symptoms seemed worse. He was referred for direct admission to expedite chemotherapy. Port placed 5/24, and chemo to start that afternoon.   Subjective: Appetite improved, walking more now, energy better, right upper abdominal quadrant pain improved. In good spirits.   Objective: BP 105/62 (BP Location: Right Arm)   Pulse 71   Temp (!) 97.5 F (36.4 C) (Oral)   Resp 16   SpO2 92%   Gen: Pleasant male in no distress Pulm: Clear, nonlabored  CV: RRR, no MRG, no pitting edema GI: Soft, NT, ND, +BS  Neuro: Alert and oriented. No new focal deficits. Ext: Warm, no deformities. Skin: Right port site c/d/I, mildly appropriately tender tunneled line. No erythema or discharge or other rashes, lesions or ulcers on visualized skin   Assessment & Plan: DLBCL Stage IV:  - Port 5/24, started rituximab 5/24, EPOCH chemotherapy to begin 5/28 per Dr. Candise Che, curative intent. Continue solumedrol 125mg  IV daily for now, then just with treatments.  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  - Colon mass is not obstructing at this time. Continue bowel regimen. Surgery, Dr. Maisie Fus, has recommended no  surgery for this issue. - For pericolonic fat stranding, leukocytosis, continue zosyn empirically. WBC up, tumor necrosis possibly contributing. - Oncology recommended against pharmacologic VTE ppx due to risk of bleeding, so encouraged patient to ambulate often, will order SCDs - Adrenal masses noted. No specific symptoms/signs of insufficiency, and AM cortisol adequate at 16.3.  - Pre Tx PET scan ordered (inpt vs. outpatient) - Pre Tx echocardiogram appears normal, no cardiomyopathy.   Hyponatremia, dehydration, lactic acidosis: Resolved.   Moderate protein calorie malnutrition:  - Supplement protein as able  History of gout:  - Continue new allopurinol and symptomatic monitoring. Uric acid level < 6.  Insomnia:  - Trazodone prn.   OSA:  - CPAP qHS  Tyrone Nine, MD [facility] [email or web address] 09/21/2022, 1:44 PM

## 2022-09-21 NOTE — Progress Notes (Signed)
HEMATOLOGY/ONCOLOGY INPATIENT PROGRESS NOTE  Date of Service: 09/21/2022  Inpatient Attending: .Tyrone Nine, MD   SUBJECTIVE Patient is feeling much better this morning with decreased abdominal pain and distention.  Passing gas but has not had a bowel movement but this is in the context of limited p.o. intake. He tolerated his Rituxan without any reaction issues. Notes that he starting to feel more hungry and would like to try to advance his diet.  We will switch him from full liquids to soft foods.  Was counseled on chewing his food well and using low fiber soft foods.  We also discussed starting him on Ensure nutritional supplements. No overt fevers.  Evidence of GI bleeding. Notes that his night sweats have improved significantly. He is not notably better spirits and has been walking with some help.  Does still feel a little off balance at time.   OBJECTIVE:  NAD  PHYSICAL EXAMINATION: . Vitals:   09/20/22 2011 09/20/22 2048 09/20/22 2234 09/21/22 0334  BP: 110/83 114/66 106/62 105/62  Pulse: 84 89 80 71  Resp: 16 16 18 16   Temp:  (!) 97.5 F (36.4 C)    TempSrc:  Oral    SpO2: 97% 95% 93% 92%   GENERAL:alert, in no acute distress and comfortable SKIN: no acute rashes, no significant lesions EYES: conjunctiva are pink and non-injected, sclera anicteric OROPHARYNX: MMM, no exudates, no oropharyngeal erythema or ulceration NECK: supple, no JVD LYMPH:  no palpable lymphadenopathy in the cervical, axillary or inguinal regions LUNGS: clear to auscultation b/l with normal respiratory effort HEART: regular rate & rhythm ABDOMEN:  normoactive bowel sounds , non tender, not distended. Extremity: no pedal edema PSYCH: alert & oriented x 3 with fluent speech NEURO: no focal motor/sensory deficits   MEDICAL HISTORY:  Past Medical History:  Diagnosis Date   Arthritis    High cholesterol     SURGICAL HISTORY: Past Surgical History:  Procedure Laterality Date   IR  IMAGING GUIDED PORT INSERTION  09/20/2022   REPLACEMENT TOTAL KNEE  10/2018   ROTATOR CUFF REPAIR      SOCIAL HISTORY: Social History   Socioeconomic History   Marital status: Married    Spouse name: Not on file   Number of children: Not on file   Years of education: Not on file   Highest education level: Not on file  Occupational History   Not on file  Tobacco Use   Smoking status: Never   Smokeless tobacco: Never  Vaping Use   Vaping Use: Never used  Substance and Sexual Activity   Alcohol use: Not Currently   Drug use: Not Currently   Sexual activity: Not on file  Other Topics Concern   Not on file  Social History Narrative   Not on file   Social Determinants of Health   Financial Resource Strain: Not on file  Food Insecurity: Not on file  Transportation Needs: Not on file  Physical Activity: Not on file  Stress: Not on file  Social Connections: Not on file  Intimate Partner Violence: Not on file    FAMILY HISTORY: Family History  Problem Relation Age of Onset   Atrial fibrillation Mother    Alzheimer's disease Mother    Leukemia Father    Diabetes Father    Colon cancer Neg Hx    Rectal cancer Neg Hx    Esophageal cancer Neg Hx    Stomach cancer Neg Hx     ALLERGIES:  has No Known  Allergies.  MEDICATIONS:  Scheduled Meds:  allopurinol  100 mg Oral BID   Chlorhexidine Gluconate Cloth  6 each Topical Daily   feeding supplement  237 mL Oral BID BM   [START ON 09/22/2022] methylPREDNISolone (SOLU-MEDROL) injection  125 mg Intravenous Q24H   pantoprazole (PROTONIX) IV  40 mg Intravenous Q24H   senna-docusate  2 tablet Oral BID   Continuous Infusions:  sodium chloride 50 mL/hr at 09/21/22 0503   piperacillin-tazobactam (ZOSYN)  IV 3.375 g (09/21/22 0655)   PRN Meds:.diphenhydrAMINE, LORazepam, oxyCODONE, polyethylene glycol, prochlorperazine, traZODone  REVIEW OF SYSTEMS:   .10 Point review of Systems was done is negative except as noted  above.  LABORATORY DATA:  I have reviewed the data as listed .    Latest Ref Rng & Units 09/21/2022    4:15 AM 09/20/2022    5:31 AM 09/19/2022    5:13 PM  CBC  WBC 4.0 - 10.5 K/uL 16.3  6.4  10.6   Hemoglobin 13.0 - 17.0 g/dL 16.1  09.6  04.5   Hematocrit 39.0 - 52.0 % 35.0  37.6  39.9   Platelets 150 - 400 K/uL 316  307  336    .    Latest Ref Rng & Units 09/21/2022    4:15 AM 09/20/2022    5:31 AM 09/19/2022    5:13 PM  CMP  Glucose 70 - 99 mg/dL 409  811  99   BUN 8 - 23 mg/dL 15  14  14    Creatinine 0.61 - 1.24 mg/dL 9.14  7.82  9.56   Sodium 135 - 145 mmol/L 140  135  131   Potassium 3.5 - 5.1 mmol/L 3.7  4.0  4.4   Chloride 98 - 111 mmol/L 105  100  94   CO2 22 - 32 mmol/L 25  24  24    Calcium 8.9 - 10.3 mg/dL 8.8  8.8  8.6   Total Protein 6.5 - 8.1 g/dL 6.6  6.7  6.6   Total Bilirubin 0.3 - 1.2 mg/dL 1.2  0.9  0.9   Alkaline Phos 38 - 126 U/L 62  65  66   AST 15 - 41 U/L 29  24  29    ALT 0 - 44 U/L 14  15  13     Component     Latest Ref Rng 09/19/2022 09/20/2022 09/21/2022  HCV Ab     NON REACTIVE  NON REACTIVE     Hepatitis B Surface Ag     NON REACTIVE  NON REACTIVE     Hep B Core Total Ab     NON REACTIVE  NON REACTIVE     Procalcitonin     ng/mL 0.22     Cortisol, Plasma     ug/dL 21.3     HIV Screen 4th Generation wRfx     Non Reactive   Non Reactive    Uric Acid, Serum     3.7 - 8.6 mg/dL   5.4     I have personally reviewed the radiological images as listed and agreed with the findings in the report. IR IMAGING GUIDED PORT INSERTION  Result Date: 09/20/2022 INDICATION: Colonic mass.  New diagnosis of lymphoma. EXAM: IMPLANTED PORT A CATH PLACEMENT WITH ULTRASOUND AND FLUOROSCOPIC GUIDANCE MEDICATIONS: Ancef 2 gm IV; The antibiotic was administered within an appropriate time interval prior to skin puncture. ANESTHESIA/SEDATION: Moderate (conscious) sedation was employed during this procedure. A total of Versed 2 mg and Fentanyl 100 mcg was  administered  intravenously. Moderate Sedation Time: 20 minutes. The patient's level of consciousness and vital signs were monitored continuously by radiology nursing throughout the procedure under my direct supervision. FLUOROSCOPY TIME:  Fluoroscopic dose; 0 mGy COMPLICATIONS: None immediate. PROCEDURE: The procedure, risks, benefits, and alternatives were explained to the patient. Questions regarding the procedure were encouraged and answered. The patient understands and consents to the procedure. The RIGHT neck and chest were prepped with chlorhexidine in a sterile fashion, and a sterile drape was applied covering the operative field. Maximum barrier sterile technique with sterile gowns and gloves were used for the procedure. A timeout was performed prior to the initiation of the procedure. Local anesthesia was provided with 1% lidocaine with epinephrine. After creating a small venotomy incision, a micropuncture kit was utilized to access the internal jugular vein under direct, real-time ultrasound guidance. Ultrasound image documentation was performed. The microwire was kinked to measure appropriate catheter length. A subcutaneous port pocket was then created along the upper chest wall utilizing a combination of sharp and blunt dissection. The pocket was irrigated with sterile saline. A single lumen Non-ISP power injectable port was chosen for placement. The 8 Fr catheter was tunneled from the port pocket site to the venotomy incision. The port was placed in the pocket. The external catheter was trimmed to appropriate length. At the venotomy, an 8 Fr peel-away sheath was placed over a guidewire under fluoroscopic guidance. The catheter was then placed through the sheath and the sheath was removed. Final catheter positioning was confirmed and documented with a fluoroscopic spot radiograph. The port was accessed with a Huber needle, aspirated and flushed with heparinized saline. The port pocket incision was closed with  interrupted 3-0 Vicryl suture then Dermabond was applied, including at the venotomy incision. Dressings were placed. The patient tolerated the procedure well without immediate post procedural complication. IMPRESSION: Successful placement of a RIGHT internal jugular approach power injectable Port-A-Cath. The tip of the catheter is positioned within the proximal RIGHT atrium. The catheter is ready for immediate use. Roanna Banning, MD [facility] Electronically Signed   By: Roanna Banning M.D.   On: 09/20/2022 14:44   ECHOCARDIOGRAM COMPLETE  Result Date: 09/20/2022    ECHOCARDIOGRAM REPORT   Patient Name:   Justin Cameron Date of Exam: 09/20/2022 Medical Rec #:  [id number]           Height:       68.0 in Accession #:    [id number]          Weight:       177.9 lb Date of Birth:  07/28/1955            BSA:          1.945 m Patient Age:    66 years            BP:           100/63 mmHg Patient Gender: M                   HR:           80 bpm. Exam Location:  Inpatient Procedure: 2D Echo, Cardiac Doppler and Color Doppler Indications:    Chemo  History:        Patient has no prior history of Echocardiogram examinations.  Sonographer:    Lucy Antigua Referring Phys: [id number] [name]  Sonographer Comments: Strain was attempted. Echo machine @ [facility] doesn't have strain  package. IMPRESSIONS  1. Left ventricular ejection fraction, by estimation, is 55%. The left ventricle has low normal function. The left ventricle has no regional wall motion abnormalities.  2. Right ventricular systolic function is normal. The right ventricular size is normal. There is normal pulmonary artery systolic pressure.  3. The mitral valve is normal in structure. Trivial mitral valve regurgitation. No evidence of mitral stenosis.  4. The aortic valve is normal in structure. Aortic valve regurgitation is not visualized. No aortic stenosis is present.  5. The inferior vena cava is  normal in size with greater than 50% respiratory variability, suggesting right atrial pressure of 3 mmHg. FINDINGS  Left Ventricle: Left ventricular ejection fraction, by estimation, is 50 to 55%. The left ventricle has low normal function. The left ventricle has no regional wall motion abnormalities. The left ventricular internal cavity size was normal in size. There is borderline left ventricular hypertrophy. Left ventricular diastolic parameters were normal. Right Ventricle: The right ventricular size is normal. No increase in right ventricular wall thickness. Right ventricular systolic function is normal. There is normal pulmonary artery systolic pressure. The tricuspid regurgitant velocity is 1.94 m/s, and  with an assumed right atrial pressure of 3 mmHg, the estimated right ventricular systolic pressure is 18.1 mmHg. Left Atrium: Left atrial size was normal in size. Right Atrium: Right atrial size was normal in size. Pericardium: There is no evidence of pericardial effusion. Mitral Valve: The mitral valve is normal in structure. Trivial mitral valve regurgitation. No evidence of mitral valve stenosis. Tricuspid Valve: The tricuspid valve is normal in structure. Tricuspid valve regurgitation is trivial. No evidence of tricuspid stenosis. Aortic Valve: The aortic valve is normal in structure. Aortic valve regurgitation is not visualized. No aortic stenosis is present. Aortic valve mean gradient measures 2.0 mmHg. Aortic valve peak gradient measures 3.6 mmHg. Aortic valve area, by VTI measures 3.80 cm. Pulmonic Valve: The pulmonic valve was normal in structure. Pulmonic valve regurgitation is not visualized. No evidence of pulmonic stenosis. Aorta: The aortic root is normal in size and structure. Venous: The inferior vena cava is normal in size with greater than 50% respiratory variability, suggesting right atrial pressure of 3 mmHg. IAS/Shunts: No atrial level shunt detected by color flow Doppler.  LEFT  VENTRICLE PLAX 2D LVIDd:         3.60 cm   Diastology LVIDs:         2.70 cm   LV e' medial:    10.00 cm/s LV PW:         1.20 cm   LV E/e' medial:  4.6 LV IVS:        1.00 cm   LV e' lateral:   9.36 cm/s LVOT diam:     2.20 cm   LV E/e' lateral: 4.9 LV SV:         74 LV SV Index:   38 LVOT Area:     3.80 cm  RIGHT VENTRICLE RV S prime:     10.90 cm/s TAPSE (M-mode): 1.5 cm LEFT ATRIUM             Index        RIGHT ATRIUM           Index LA Vol (A2C):   61.4 ml 31.58 ml/m  RA Area:     13.90 cm LA Vol (A4C):   58.4 ml 30.03 ml/m  RA Volume:   32.80 ml  16.87 ml/m LA Biplane Vol: 59.6 ml 30.65 ml/m  AORTIC VALVE AV Area (Vmax):    3.46 cm AV Area (Vmean):   3.17 cm AV Area (VTI):     3.80 cm AV Vmax:           94.70 cm/s AV Vmean:          64.800 cm/s AV VTI:            0.195 m AV Peak Grad:      3.6 mmHg AV Mean Grad:      2.0 mmHg LVOT Vmax:         86.20 cm/s LVOT Vmean:        54.000 cm/s LVOT VTI:          0.195 m LVOT/AV VTI ratio: 1.00  AORTA Ao Root diam: 2.90 cm Ao Asc diam:  3.60 cm MITRAL VALVE               TRICUSPID VALVE MV Area (PHT): 3.08 cm    TR Peak grad:   15.1 mmHg MV Decel Time: 246 msec    TR Vmax:        194.00 cm/s MR Peak grad: 32.5 mmHg MR Vmax:      285.00 cm/s  SHUNTS MV E velocity: 46.30 cm/s  Systemic VTI:  0.20 m MV A velocity: 61.30 cm/s  Systemic Diam: 2.20 cm MV E/A ratio:  0.76 Aditya Sabharwal Electronically signed by Dorthula Nettles Signature Date/Time: 09/20/2022/1:15:18 PM    Final    CT ABDOMEN PELVIS W CONTRAST  Result Date: 09/18/2022 CLINICAL DATA:  Colon cancer, anorexia, weakness, fever EXAM: CT ABDOMEN AND PELVIS WITH CONTRAST TECHNIQUE: Multidetector CT imaging of the abdomen and pelvis was performed using the standard protocol following bolus administration of intravenous contrast. RADIATION DOSE REDUCTION: This exam was performed according to the departmental dose-optimization program which includes automated exposure control, adjustment of the mA  and/or kV according to patient size and/or use of iterative reconstruction technique. CONTRAST:  OMNIPAQUE IOHEXOL 300 MG/ML  SOLN COMPARISON:  08/12/2022 FINDINGS: Lower chest: There are 2 left lower lobe pulmonary nodules consistent with progressive pulmonary metastases. 1.4 cm nodule image 12/2 and a 0.8 cm nodule image 15/2. No airspace disease or effusion. Hepatobiliary: Gallbladder is moderately distended without cholelithiasis or cholecystitis. Stable cysts are seen within the liver. Since the prior exam, there is loss of normal fat plane between the inferior right lobe liver and and enlarging colonic mass at the hepatic flexure. Direct invasion of the liver cannot be excluded. Pancreas: Unremarkable. No pancreatic ductal dilatation or surrounding inflammatory changes. Spleen: Normal in size without focal abnormality. Adrenals/Urinary Tract: Bilateral adrenal masses seen previously have enlarged, consistent with metastatic disease. Right adrenal mass measures up to 5.1 cm, previously 3.5 cm. The left adrenal mass measures up to 4.7 cm, previously 3.5 cm. The kidneys enhance normally and symmetrically. No urinary tract calculi or obstructive uropathy. Bladder is unremarkable. Stomach/Bowel: A large necrotic mass at the hepatic flexure of the colon has increased in size, measuring up to 12.5 x 10.1 cm. There is increasing pericolonic fat stranding and mesenteric nodularity consistent with locally invasive disease. As discussed above, there is loss of normal fat plane between the hepatic mass and the inferior margin right lobe liver and direct invasion of the liver cannot be excluded. There is no evidence of bowel obstruction or ileus. Normal appendix right lower quadrant. Vascular/Lymphatic: Increase in the size and number of mesenteric lymph nodes surrounding the hepatic flexure mass, consistent with local nodal metastases. Largest  lymph node measures 0.8 cm reference image 56/2. There is also a soft  tissue mass contiguous with the left crus of the diaphragm, which appears separate from the enlarging left adrenal mass. This measures 3.5 x 3.0 cm, new since prior study and consistent with metastatic deposit. Stable atherosclerosis of the aorta and its branches. Reproductive: Prostate is unremarkable. Other: Trace pelvic free fluid. No free intraperitoneal gas. No abdominal wall hernia. Musculoskeletal: There are no acute or destructive bony abnormalities. Reconstructed images demonstrate no additional findings. IMPRESSION: 1. Enlarging mass at the hepatic flexure of the colon consistent with progressive colonic malignancy. There is increasing pericolonic fat stranding with loss of fat plane between the mass and the right lobe liver concerning for direct hepatic involvement. 2. Increasing mesenteric adenopathy, new soft tissue mass along the crus of the left hemidiaphragm, and enlarging bilateral adrenal masses consistent with progressive intra-abdominal metastases. 3. New and enlarging left lower lobe pulmonary nodules consistent with progressive pulmonary metastatic disease. 4. Trace pelvic free fluid. 5.  Aortic Atherosclerosis (ICD10-I70.0). Electronically Signed   By: Sharlet Salina M.D.   On: 09/18/2022 18:46   DG Chest Port 1 View  Result Date: 09/18/2022 CLINICAL DATA:  Fever and vomiting EXAM: PORTABLE CHEST 1 VIEW COMPARISON:  Radiograph 09/04/2016 and CT chest 08/22/2022 FINDINGS: Stable cardiomediastinal silhouette. Aortic atherosclerotic calcification. No focal consolidation, pleural effusion, or pneumothorax. No displaced rib fractures. IMPRESSION: No active disease. Electronically Signed   By: Minerva Fester M.D.   On: 09/18/2022 17:37   CT CHEST W CONTRAST  Result Date: 08/22/2022 CLINICAL DATA:  Colon cancer. Elevated CEA levels. Chronic cough. * Tracking Code: BO * EXAM: CT CHEST WITH CONTRAST TECHNIQUE: Multidetector CT imaging of the chest was performed during intravenous contrast  administration. RADIATION DOSE REDUCTION: This exam was performed according to the departmental dose-optimization program which includes automated exposure control, adjustment of the mA and/or kV according to patient size and/or use of iterative reconstruction technique. CONTRAST:  OMNIPAQUE IOHEXOL 300 MG/ML  SOLN COMPARISON:  Abdominopelvic CT 08/12/2022. Chest radiographs 09/04/2016. FINDINGS: Cardiovascular: No acute vascular findings. Diffuse atherosclerosis of the aorta, great vessels and coronary arteries. The heart size is normal. There is no pericardial effusion. Mediastinum/Nodes: There are no enlarged mediastinal, hilar or axillary lymph nodes. The thyroid gland, trachea and esophagus demonstrate no significant findings. Lungs/Pleura: No pleural effusion or pneumothorax. Mild central airway thickening and scattered subpleural reticulation in both lungs. There are scattered calcified granulomas. Along the left hemidiaphragm, there is a subpleural, noncalcified 6 mm nodule on image 111/7. There is a small perifissural nodule along the superior aspect of the left major fissure on image 69/7. No highly suspicious nodules. Upper abdomen: No acute findings are seen within the visualized upper abdomen. Scattered hepatic cysts are stable compared with the recent abdominal CT. Likewise, the previously demonstrated bilateral adrenal nodules are unchanged, not further characterized by this study which was performed with contrast. The right adrenal nodule measures up to 3.9 cm on image 129/2 and has a density of 47 HU. The left nodule measures 3.6 cm and has a density of 44 HU. Previously demonstrated right colonic mass is not imaged. Musculoskeletal/Chest wall: There is no chest wall mass or suspicious osseous finding. Multilevel thoracic spondylosis. IMPRESSION: 1. No definite evidence of metastatic disease in the chest. 2. Scattered small calcified and noncalcified pulmonary nodules are likely benign.  Recommend attention on follow-up. 3. Stable indeterminate bilateral adrenal nodules from recent abdominal CT, not further characterized by this study.  Recommend further evaluation with adrenal protocol CT or MRI (without and with contrast). Alternatively, PET-CT could be considered. 4.  Aortic Atherosclerosis (ICD10-I70.0). Electronically Signed   By: Carey Bullocks M.D.   On: 08/22/2022 14:33    ASSESSMENT & PLAN:   67 y.o. male with:  Newly diagnosed stage IVb large B-cell lymphoma ECHo done today showed normal EF of 55% -port a cath placed by IR -Hepatitis BC and HIV testing negative Large hepatic flexure mass from large B-cell lymphoma with impending obstruction and high risk for perforation.  Possible infection of the necrotic tissue in the tumor or bowel wall currently on antibiotics. 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  Patient is a lightheadedness and some orthostatic symptoms and severe fatigue.  Rule out adrenal insufficiency. Failure to thrive due to poor p.o. intake bowel issues and new diagnosis of large B-cell lymphoma. Dehydration due to poor p.o. intake Significant abdominal discomfort from his large colonic mass with impending obstruction. Arthritis Dyslipidemia History of gout  PLAN: -Labs this morning show stable blood counts and chemistries.  All labs as far have been discussed with the patient and we discussed today with his wife on the phone as well. -Patient tolerated first dose of Rituxan 09/16/2022 without any notable reactions or toxicities. -continue Allopurinol for TLS prophylaxis -Will switch to Solu-Medrol 125 mg IV daily for next 2 days after which he will get steroids with his premedications with treatment. -will plan to start [facility] from 5/28 to give patient some time for the bowels to heal and some additional IV antibiotics to cool down  any source of infection. -on Zosyn for possible bowel infection -SCD and ambulation for VTE prophylaxis no lovenox due to risk of GI bleeding -PT evaluation -Dietician evaluation -Changed diet to soft foods and added Ensure 2 times a day between meals.  Encourage patient to maintain good p.o. fluid intake. -PET/CT inpatient vs outpatient  .The total time spent in the appointment was 35 minutes* .  All of the patient's questions were answered with apparent satisfaction. The patient knows to call the clinic with any problems, questions or concerns.   Wyvonnia Lora MD MS AAHIVMS [facility] Hematology/Oncology Physician [facility]  .*Total Encounter Time as defined by the Centers for Medicare and Medicaid Services includes, in addition to the face-to-face time of a patient visit (documented in the note above) non-face-to-face time: obtaining and reviewing outside history, ordering and reviewing medications, tests or procedures, care coordination (communications with other health care professionals or caregivers) and documentation in the medical record.

## 2022-09-22 DIAGNOSIS — C8330 Diffuse large B-cell lymphoma, unspecified site: Secondary | ICD-10-CM | POA: Diagnosis not present

## 2022-09-22 DIAGNOSIS — Z5111 Encounter for antineoplastic chemotherapy: Secondary | ICD-10-CM | POA: Diagnosis not present

## 2022-09-22 LAB — CBC WITH DIFFERENTIAL/PLATELET
Abs Immature Granulocytes: 0.08 10*3/uL — ABNORMAL HIGH (ref 0.00–0.07)
Basophils Absolute: 0 10*3/uL (ref 0.0–0.1)
Basophils Relative: 0 %
Eosinophils Absolute: 0 10*3/uL (ref 0.0–0.5)
Eosinophils Relative: 0 %
HCT: 33.5 % — ABNORMAL LOW (ref 39.0–52.0)
Hemoglobin: 10.3 g/dL — ABNORMAL LOW (ref 13.0–17.0)
Immature Granulocytes: 1 %
Lymphocytes Relative: 4 %
Lymphs Abs: 0.4 10*3/uL — ABNORMAL LOW (ref 0.7–4.0)
MCH: 25.1 pg — ABNORMAL LOW (ref 26.0–34.0)
MCHC: 30.7 g/dL (ref 30.0–36.0)
MCV: 81.5 fL (ref 80.0–100.0)
Monocytes Absolute: 0.7 10*3/uL (ref 0.1–1.0)
Monocytes Relative: 6 %
Neutro Abs: 10.9 10*3/uL — ABNORMAL HIGH (ref 1.7–7.7)
Neutrophils Relative %: 89 %
Platelets: 307 10*3/uL (ref 150–400)
RBC: 4.11 MIL/uL — ABNORMAL LOW (ref 4.22–5.81)
RDW: 16 % — ABNORMAL HIGH (ref 11.5–15.5)
WBC: 12.2 10*3/uL — ABNORMAL HIGH (ref 4.0–10.5)
nRBC: 0 % (ref 0.0–0.2)

## 2022-09-22 LAB — COMPREHENSIVE METABOLIC PANEL
ALT: 13 U/L (ref 0–44)
AST: 25 U/L (ref 15–41)
Albumin: 2.8 g/dL — ABNORMAL LOW (ref 3.5–5.0)
Alkaline Phosphatase: 54 U/L (ref 38–126)
Anion gap: 10 (ref 5–15)
BUN: 16 mg/dL (ref 8–23)
CO2: 25 mmol/L (ref 22–32)
Calcium: 8.6 mg/dL — ABNORMAL LOW (ref 8.9–10.3)
Chloride: 104 mmol/L (ref 98–111)
Creatinine, Ser: 0.72 mg/dL (ref 0.61–1.24)
GFR, Estimated: >60 mL/min (ref >60)
Glucose, Bld: 121 mg/dL — ABNORMAL HIGH (ref 70–99)
Potassium: 3.8 mmol/L (ref 3.5–5.1)
Sodium: 139 mmol/L (ref 135–145)
Total Bilirubin: 0.6 mg/dL (ref 0.3–1.2)
Total Protein: 6 g/dL — ABNORMAL LOW (ref 6.5–8.1)

## 2022-09-22 LAB — URIC ACID: Uric Acid, Serum: 3.8 mg/dL (ref 3.7–8.6)

## 2022-09-22 MED ORDER — ADULT MULTIVITAMIN W/MINERALS CH
1.0000 | ORAL_TABLET | Freq: Every day | ORAL | Status: DC
  Administered 2022-09-22 – 2022-09-23 (×2): 1 via ORAL
  Filled 2022-09-22 (×2): qty 1

## 2022-09-22 NOTE — Progress Notes (Signed)
Initial Nutrition Assessment  DOCUMENTATION CODES:   Severe malnutrition in context of chronic illness  INTERVENTION:  - Continue Ensure Enlive po BID, each supplement provides 350 kcal and 20 grams of protein.  - Add MVI q day.   NUTRITION DIAGNOSIS:   Severe Malnutrition related to inability to eat as evidenced by energy intake < 75% for > or equal to 1 month, percent weight loss.  GOAL:   Patient will meet greater than or equal to 90% of their needs  MONITOR:   PO intake  REASON FOR ASSESSMENT:   Consult Assessment of nutrition requirement/status  ASSESSMENT:   67 y.o. male admits related to abdominal pain and poor oral intakes. PMH includes arthritis and high cholesterol. 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  Meds reviewed: miralax, senokot. Labs reviewed: WDL.   RD attempted to call pt's room but no answer. Intakes noted at 0% per record. Per record, pt has experienced an 11% wt loss in less than a month which is significant. Pt has Ensure BID ordered, per meds hx record, pt has not been accepting supplements. Per H&P pt has had poor PO intakes for the past 3 months.   NUTRITION - FOCUSED PHYSICAL EXAM:  Remote assessment.   Diet Order:   Diet Order             DIET SOFT Room service appropriate? Yes; Fluid consistency: Thin  Diet effective now                   EDUCATION NEEDS:   Not appropriate for education at this time  Skin:  Skin Assessment: Reviewed RN Assessment  Last BM:  5/25  Height:   Ht Readings from Last 1 Encounters:  09/19/22 5\' 8"  (1.727 m)    Weight:   Wt Readings from Last 1 Encounters:  09/19/22 80.7 kg    Ideal Body Weight:     BMI:  There is no height or weight on file to calculate BMI.  Estimated Nutritional Needs:   Kcal:  2400-2600 kcals  Protein:  120-130 gm  Fluid:  >/= 2.4 L  Bethann Humble, RD, LDN, CNSC.

## 2022-09-22 NOTE — Progress Notes (Signed)
[facility] PROGRESS NOTE  TSUYOSHI WHARY (DOB: 1955/05/15) AOZ:[id number] PCP: Daisy Floro, MD Outpatient Specialists: Oncology, Dr. Candise Che  Brief Narrative: Justin Cameron is a 67 y.o. male with a history of OSA, newly diagnosed metastatic DLBCL who [facility] for ongoing abdominal pain, bloating, unintentional weight loss, night sweats. He had been seen in the ED 5/22 where CT showed an enlarging mass at the hepatic flexure, increased pericolonic ft stranding, increased mesenteric lymphadenopathy, enlarging bilateral adrenal masses, and new mass along crus of left diaphragm. There were also new and enlarging LLL pulmonary nodules. His symptoms were stable with IVF and he was discharged, but on return to the oncology clinic his symptoms seemed worse. He was referred for direct admission to expedite chemotherapy. Port placed 5/24, and chemo to start that afternoon.   Subjective: Had a fine night, still some night sweats and RUQ abd pain but both significantly improved from PTA. Wants a shower this AM. No gouty arthritis reported.   Objective: BP 112/63 (BP Location: Left Arm)   Pulse 63   Temp 97.6 F (36.4 C) (Oral)   Resp 16   SpO2 98%   Gen: Nontoxic male in no distress Pulm: Clear, nonlabored  CV: RRR, no MRG or edema GI: Soft, NT, ND, +BS. Tender right lateral upper abdomen with no rebound or guarding.  Neuro: Alert and oriented. No new focal deficits. Ext: Warm, no deformities Skin: Port site c/d/i. No new rashes, lesions or ulcers on visualized skin   Assessment & Plan: DLBCL Stage IV:  - Port 5/24, started rituximab 5/24, EPOCH chemotherapy to begin 5/28 per Dr. Candise Che, curative intent. Continue solumedrol 125mg  IV daily for now, then just with treatments.  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 Level is 3.8 today. - Colon mass is not obstructing at this time. Continue bowel regimen  (senna-ducosate 2 tabs BID and miralax 17g daily + 17g prn daily). Surgery, Dr. Maisie Fus, has recommended no surgery for this issue. - For pericolonic fat stranding, leukocytosis, continue zosyn empirically. WBC elevated but improved, tumor necrosis possibly contributing. - Oncology recommended against pharmacologic VTE ppx due to risk of bleeding, so encouraged patient to ambulate often, will order SCDs - Adrenal masses noted. No specific symptoms/signs of insufficiency, and AM cortisol adequate at 16.3.  - Pre Tx PET scan ordered (inpt vs. outpatient) - Pre Tx echocardiogram appears normal, no cardiomyopathy.   Hyponatremia, dehydration, lactic acidosis: Resolved.   Moderate protein calorie malnutrition:  - Supplement protein as able  History of gout:  - Continue new allopurinol and symptomatic monitoring. Uric acid level remains < 6.  Insomnia:  - Trazodone prn.   OSA:  - CPAP qHS  Tyrone Nine, MD [facility] [email or web address] 09/22/2022, 9:15 AM

## 2022-09-23 ENCOUNTER — Encounter (HOSPITAL_COMMUNITY): Payer: Self-pay | Admitting: Internal Medicine

## 2022-09-23 ENCOUNTER — Other Ambulatory Visit: Payer: Self-pay

## 2022-09-23 DIAGNOSIS — E43 Unspecified severe protein-calorie malnutrition: Secondary | ICD-10-CM | POA: Insufficient documentation

## 2022-09-23 DIAGNOSIS — C8330 Diffuse large B-cell lymphoma, unspecified site: Secondary | ICD-10-CM | POA: Diagnosis not present

## 2022-09-23 LAB — COMPREHENSIVE METABOLIC PANEL
ALT: 17 U/L (ref 0–44)
AST: 29 U/L (ref 15–41)
Albumin: 2.5 g/dL — ABNORMAL LOW (ref 3.5–5.0)
Alkaline Phosphatase: 52 U/L (ref 38–126)
Anion gap: 9 (ref 5–15)
BUN: 18 mg/dL (ref 8–23)
CO2: 26 mmol/L (ref 22–32)
Calcium: 8.6 mg/dL — ABNORMAL LOW (ref 8.9–10.3)
Chloride: 104 mmol/L (ref 98–111)
Creatinine, Ser: 0.71 mg/dL (ref 0.61–1.24)
GFR, Estimated: >60 mL/min (ref >60)
Glucose, Bld: 111 mg/dL — ABNORMAL HIGH (ref 70–99)
Potassium: 4 mmol/L (ref 3.5–5.1)
Sodium: 139 mmol/L (ref 135–145)
Total Bilirubin: 0.6 mg/dL (ref 0.3–1.2)
Total Protein: 5.9 g/dL — ABNORMAL LOW (ref 6.5–8.1)

## 2022-09-23 LAB — CBC WITH DIFFERENTIAL/PLATELET
Abs Immature Granulocytes: 0.16 10*3/uL — ABNORMAL HIGH (ref 0.00–0.07)
Basophils Absolute: 0 10*3/uL (ref 0.0–0.1)
Basophils Relative: 0 %
Eosinophils Absolute: 0 10*3/uL (ref 0.0–0.5)
Eosinophils Relative: 0 %
HCT: 33.2 % — ABNORMAL LOW (ref 39.0–52.0)
Hemoglobin: 10.2 g/dL — ABNORMAL LOW (ref 13.0–17.0)
Immature Granulocytes: 1 %
Lymphocytes Relative: 4 %
Lymphs Abs: 0.6 10*3/uL — ABNORMAL LOW (ref 0.7–4.0)
MCH: 24.9 pg — ABNORMAL LOW (ref 26.0–34.0)
MCHC: 30.7 g/dL (ref 30.0–36.0)
MCV: 81 fL (ref 80.0–100.0)
Monocytes Absolute: 0.9 10*3/uL (ref 0.1–1.0)
Monocytes Relative: 7 %
Neutro Abs: 11 10*3/uL — ABNORMAL HIGH (ref 1.7–7.7)
Neutrophils Relative %: 88 %
Platelets: 306 10*3/uL (ref 150–400)
RBC: 4.1 MIL/uL — ABNORMAL LOW (ref 4.22–5.81)
RDW: 16.1 % — ABNORMAL HIGH (ref 11.5–15.5)
WBC: 12.6 10*3/uL — ABNORMAL HIGH (ref 4.0–10.5)
nRBC: 0 % (ref 0.0–0.2)

## 2022-09-23 LAB — URIC ACID: Uric Acid, Serum: 3 mg/dL — ABNORMAL LOW (ref 3.7–8.6)

## 2022-09-23 LAB — CULTURE, BLOOD (ROUTINE X 2)

## 2022-09-23 MED ORDER — HYDROMORPHONE HCL 1 MG/ML IJ SOLN
0.5000 mg | Freq: Once | INTRAMUSCULAR | Status: AC | PRN
  Administered 2022-09-23: 0.5 mg via INTRAVENOUS
  Filled 2022-09-23: qty 0.5

## 2022-09-23 MED ORDER — PANTOPRAZOLE SODIUM 40 MG PO TBEC
40.0000 mg | DELAYED_RELEASE_TABLET | Freq: Every day | ORAL | Status: DC
  Administered 2022-09-23: 40 mg via ORAL
  Filled 2022-09-23: qty 1

## 2022-09-23 MED ORDER — DIPHENHYDRAMINE HCL 25 MG PO CAPS
25.0000 mg | ORAL_CAPSULE | Freq: Four times a day (QID) | ORAL | Status: DC | PRN
  Administered 2022-09-23: 25 mg via ORAL
  Filled 2022-09-23: qty 1

## 2022-09-23 MED ORDER — TRAZODONE HCL 50 MG PO TABS: 100.0000 mg | ORAL_TABLET | Freq: Every evening | ORAL | Status: DC | PRN

## 2022-09-23 MED ORDER — MELATONIN 3 MG PO TABS
3.0000 mg | ORAL_TABLET | Freq: Every day | ORAL | Status: DC
  Administered 2022-09-23: 3 mg via ORAL
  Filled 2022-09-23: qty 1

## 2022-09-23 NOTE — Care Management Important Message (Signed)
Important Message  Patient Details IM Letter given. Name: Justin Cameron MRN: [id number] Date of Birth: October 05, 1955   Medicare Important Message Given:  Yes     Caren Macadam 09/23/2022, 10:30 AM

## 2022-09-23 NOTE — Progress Notes (Signed)
[facility] PROGRESS NOTE  SELVIN GRANEY (DOB: [birth year]) ZOX:[id number] PCP: Daisy Floro, MD Outpatient Specialists: Oncology, Dr. Candise Che  Brief Narrative: Justin Cameron is a 67 y.o. male with a history of OSA, newly diagnosed metastatic DLBCL who [facility] on [facility]/2022 for ongoing abdominal pain, bloating, unintentional weight loss, night sweats. He had been seen in the ED 5/22 where CT showed an enlarging mass at the hepatic flexure, increased pericolonic ft stranding, increased mesenteric lymphadenopathy, enlarging bilateral adrenal masses, and new mass along crus of left diaphragm. There were also new and enlarging LLL pulmonary nodules. His symptoms were stable with IVF and he was discharged, but on return to the oncology clinic his symptoms seemed worse. He was referred for direct admission to expedite chemotherapy. Port placed 5/24, and chemo to start that afternoon.   Subjective: Trouble sleeping, no other complaints.   Objective: BP 121/70 (BP Location: Right Arm)   Pulse 60   Temp 98.1 F (36.7 C) (Oral)   Resp 16   SpO2 98%   No distress Clear, nonlabored RRR, no edema Soft, NT, ND, stable +BS  Assessment & Plan: DLBCL Stage IV:  - Port 5/24, started rituximab 5/24, EPOCH chemotherapy to begin 5/28 per Dr. Candise Che, curative intent. Continue solumedrol 125mg  IV daily for now, then just with treatments.  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 Level is 3.8 today. - Colon mass is not obstructing at this time. Continue bowel regimen (senna-ducosate 2 tabs BID and miralax 17g daily + 17g prn daily). Surgery, Dr. Maisie Fus, has recommended no surgery for this issue. - For pericolonic fat stranding, leukocytosis, continue zosyn empirically. WBC elevated but improved, tumor necrosis possibly contributing. - Oncology recommended against pharmacologic VTE ppx due to risk of bleeding, so encouraged patient to  ambulate often, will order SCDs - Adrenal masses noted. No specific symptoms/signs of insufficiency, and AM cortisol adequate at 16.3.  - Pre Tx PET scan ordered (inpt vs. outpatient) - Pre Tx echocardiogram appears normal, no cardiomyopathy.   Hyponatremia, dehydration, lactic acidosis: Resolved.   Moderate protein calorie malnutrition:  - Supplement protein as able  History of gout:  - Continue new allopurinol and symptomatic monitoring. Uric acid level remains < 6.  Insomnia: Worsened by hospitalization and steroids. - Augment trazodone dose, add melatonin.   OSA:  - CPAP qHS  Tyrone Nine, MD [facility] [email or web address] 09/23/2022, 3:03 PM

## 2022-09-24 ENCOUNTER — Other Ambulatory Visit: Payer: Self-pay

## 2022-09-24 ENCOUNTER — Inpatient Hospital Stay (HOSPITAL_COMMUNITY): Payer: Medicare Other

## 2022-09-24 ENCOUNTER — Encounter (HOSPITAL_COMMUNITY): Payer: Self-pay | Admitting: Internal Medicine

## 2022-09-24 ENCOUNTER — Inpatient Hospital Stay (HOSPITAL_COMMUNITY): Payer: Medicare Other | Admitting: Anesthesiology

## 2022-09-24 ENCOUNTER — Encounter (HOSPITAL_COMMUNITY): Payer: Medicare Other

## 2022-09-24 ENCOUNTER — Encounter (HOSPITAL_COMMUNITY): Admission: AD | Disposition: E | Payer: Self-pay | Source: Ambulatory Visit | Attending: Family Medicine

## 2022-09-24 DIAGNOSIS — K631 Perforation of intestine (nontraumatic): Secondary | ICD-10-CM

## 2022-09-24 DIAGNOSIS — E785 Hyperlipidemia, unspecified: Secondary | ICD-10-CM | POA: Diagnosis not present

## 2022-09-24 DIAGNOSIS — R1011 Right upper quadrant pain: Secondary | ICD-10-CM | POA: Diagnosis not present

## 2022-09-24 DIAGNOSIS — G4730 Sleep apnea, unspecified: Secondary | ICD-10-CM | POA: Diagnosis not present

## 2022-09-24 DIAGNOSIS — C8590 Non-Hodgkin lymphoma, unspecified, unspecified site: Secondary | ICD-10-CM | POA: Diagnosis not present

## 2022-09-24 DIAGNOSIS — K5669 Other partial intestinal obstruction: Secondary | ICD-10-CM

## 2022-09-24 DIAGNOSIS — C8330 Diffuse large B-cell lymphoma, unspecified site: Secondary | ICD-10-CM | POA: Diagnosis not present

## 2022-09-24 HISTORY — PX: LAPAROTOMY: SHX154

## 2022-09-24 LAB — CBC WITH DIFFERENTIAL/PLATELET
Abs Immature Granulocytes: 0.37 10*3/uL — ABNORMAL HIGH (ref 0.00–0.07)
Basophils Absolute: 0.1 10*3/uL (ref 0.0–0.1)
Basophils Relative: 0 %
Eosinophils Absolute: 0 10*3/uL (ref 0.0–0.5)
Eosinophils Relative: 0 %
HCT: 39 % (ref 39.0–52.0)
Hemoglobin: 12.1 g/dL — ABNORMAL LOW (ref 13.0–17.0)
Immature Granulocytes: 2 %
Lymphocytes Relative: 3 %
Lymphs Abs: 0.7 10*3/uL (ref 0.7–4.0)
MCH: 25.1 pg — ABNORMAL LOW (ref 26.0–34.0)
MCHC: 31 g/dL (ref 30.0–36.0)
MCV: 80.9 fL (ref 80.0–100.0)
Monocytes Absolute: 1 10*3/uL (ref 0.1–1.0)
Monocytes Relative: 5 %
Neutro Abs: 17.8 10*3/uL — ABNORMAL HIGH (ref 1.7–7.7)
Neutrophils Relative %: 90 %
Platelets: 490 10*3/uL — ABNORMAL HIGH (ref 150–400)
RBC: 4.82 MIL/uL (ref 4.22–5.81)
RDW: 16.2 % — ABNORMAL HIGH (ref 11.5–15.5)
WBC: 19.9 10*3/uL — ABNORMAL HIGH (ref 4.0–10.5)
nRBC: 0 % (ref 0.0–0.2)

## 2022-09-24 LAB — COMPREHENSIVE METABOLIC PANEL
ALT: 23 U/L (ref 0–44)
AST: 33 U/L (ref 15–41)
Albumin: 3.1 g/dL — ABNORMAL LOW (ref 3.5–5.0)
Alkaline Phosphatase: 69 U/L (ref 38–126)
Anion gap: 10 (ref 5–15)
BUN: 22 mg/dL (ref 8–23)
CO2: 26 mmol/L (ref 22–32)
Calcium: 8.8 mg/dL — ABNORMAL LOW (ref 8.9–10.3)
Chloride: 105 mmol/L (ref 98–111)
Creatinine, Ser: 0.74 mg/dL (ref 0.61–1.24)
GFR, Estimated: >60 mL/min (ref >60)
Glucose, Bld: 118 mg/dL — ABNORMAL HIGH (ref 70–99)
Potassium: 4.2 mmol/L (ref 3.5–5.1)
Sodium: 141 mmol/L (ref 135–145)
Total Bilirubin: 0.5 mg/dL (ref 0.3–1.2)
Total Protein: 6.6 g/dL (ref 6.5–8.1)

## 2022-09-24 LAB — BLOOD GAS, ARTERIAL
Acid-base deficit: 0.5 mmol/L (ref 0.0–2.0)
Acid-base deficit: 2.9 mmol/L — ABNORMAL HIGH (ref 0.0–2.0)
Bicarbonate: 24.2 mmol/L (ref 20.0–28.0)
Bicarbonate: 25.5 mmol/L (ref 20.0–28.0)
Drawn by: 29503
Drawn by: 29503
O2 Content: 15 L/min
O2 Saturation: 98.1 %
Patient temperature: 37
Patient temperature: 37
pCO2 arterial: 39 mmHg (ref 32–48)
pCO2 arterial: 61 mmHg — ABNORMAL HIGH (ref 32–48)
pH, Arterial: 7.4 (ref 7.35–7.45)
pH, Arterial: 7.23 — ABNORMAL LOW (ref 7.35–7.45)
pO2, Arterial: 85 mmHg (ref 83–108)
pO2, Arterial: 168 mmHg — ABNORMAL HIGH (ref 83–108)

## 2022-09-24 LAB — BASIC METABOLIC PANEL
Anion gap: 13 (ref 5–15)
Anion gap: 12 (ref 5–15)
BUN: 28 mg/dL — ABNORMAL HIGH (ref 8–23)
BUN: 29 mg/dL — ABNORMAL HIGH (ref 8–23)
CO2: 24 mmol/L (ref 22–32)
CO2: 23 mmol/L (ref 22–32)
Calcium: 8.4 mg/dL — ABNORMAL LOW (ref 8.9–10.3)
Calcium: 7.8 mg/dL — ABNORMAL LOW (ref 8.9–10.3)
Chloride: 103 mmol/L (ref 98–111)
Chloride: 105 mmol/L (ref 98–111)
Creatinine, Ser: 1.27 mg/dL — ABNORMAL HIGH (ref 0.61–1.24)
Creatinine, Ser: 1.53 mg/dL — ABNORMAL HIGH (ref 0.61–1.24)
GFR, Estimated: >60 mL/min (ref >60)
GFR, Estimated: 50 mL/min — ABNORMAL LOW (ref >60)
Glucose, Bld: 112 mg/dL — ABNORMAL HIGH (ref 70–99)
Glucose, Bld: 115 mg/dL — ABNORMAL HIGH (ref 70–99)
Potassium: 4.1 mmol/L (ref 3.5–5.1)
Potassium: 4.5 mmol/L (ref 3.5–5.1)
Sodium: 140 mmol/L (ref 135–145)
Sodium: 140 mmol/L (ref 135–145)

## 2022-09-24 LAB — MAGNESIUM: Magnesium: 2.3 mg/dL (ref 1.7–2.4)

## 2022-09-24 LAB — CBC
HCT: 37.5 % — ABNORMAL LOW (ref 39.0–52.0)
Hemoglobin: 11.4 g/dL — ABNORMAL LOW (ref 13.0–17.0)
MCH: 25.2 pg — ABNORMAL LOW (ref 26.0–34.0)
MCHC: 30.4 g/dL (ref 30.0–36.0)
MCV: 83 fL (ref 80.0–100.0)
Platelets: 472 10*3/uL — ABNORMAL HIGH (ref 150–400)
RBC: 4.52 MIL/uL (ref 4.22–5.81)
RDW: 16.7 % — ABNORMAL HIGH (ref 11.5–15.5)
WBC: 42.6 10*3/uL — ABNORMAL HIGH (ref 4.0–10.5)
nRBC: 0.1 % (ref 0.0–0.2)

## 2022-09-24 LAB — GLUCOSE, CAPILLARY
Glucose-Capillary: 120 mg/dL — ABNORMAL HIGH (ref 70–99)
Glucose-Capillary: 153 mg/dL — ABNORMAL HIGH (ref 70–99)

## 2022-09-24 LAB — LACTIC ACID, PLASMA
Lactic Acid, Venous: 3.8 mmol/L (ref 0.5–1.9)
Lactic Acid, Venous: 3.7 mmol/L (ref 0.5–1.9)

## 2022-09-24 LAB — PHOSPHORUS: Phosphorus: 5.4 mg/dL — ABNORMAL HIGH (ref 2.5–4.6)

## 2022-09-24 LAB — MRSA NEXT GEN BY PCR, NASAL: MRSA by PCR Next Gen: NOT DETECTED

## 2022-09-24 LAB — URIC ACID: Uric Acid, Serum: 3 mg/dL — ABNORMAL LOW (ref 3.7–8.6)

## 2022-09-24 LAB — ABO/RH: ABO/RH(D): O POS

## 2022-09-24 SURGERY — LAPAROTOMY, EXPLORATORY
Anesthesia: General | Site: Abdomen

## 2022-09-24 MED ORDER — ORAL CARE MOUTH RINSE: 15.0000 mL | OROMUCOSAL | Status: DC | PRN

## 2022-09-24 MED ORDER — INSULIN ASPART 100 UNIT/ML IJ SOLN
0.0000 [IU] | INTRAMUSCULAR | Status: DC
  Administered 2022-09-25 (×4): 1 [IU] via SUBCUTANEOUS
  Administered 2022-09-25: 2 [IU] via SUBCUTANEOUS
  Administered 2022-09-25: 1 [IU] via SUBCUTANEOUS

## 2022-09-24 MED ORDER — PHENYLEPHRINE HCL-NACL 20-0.9 MG/250ML-% IV SOLN: 25.0000 ug/min | INTRAVENOUS | Status: DC

## 2022-09-24 MED ORDER — FENTANYL CITRATE PF 50 MCG/ML IJ SOSY
25.0000 ug | PREFILLED_SYRINGE | INTRAMUSCULAR | Status: DC | PRN
  Administered 2022-09-24 (×2): 50 ug via INTRAVENOUS
  Administered 2022-09-25: 100 ug via INTRAVENOUS
  Filled 2022-09-24: qty 1
  Filled 2022-09-24: qty 2
  Filled 2022-09-24: qty 1

## 2022-09-24 MED ORDER — LIDOCAINE 2% (20 MG/ML) 5 ML SYRINGE
INTRAMUSCULAR | Status: DC | PRN
  Administered 2022-09-24: 40 mg via INTRAVENOUS

## 2022-09-24 MED ORDER — ALBUMIN HUMAN 5 % IV SOLN: INTRAVENOUS | Status: DC | PRN

## 2022-09-24 MED ORDER — POLYETHYLENE GLYCOL 3350 17 G PO PACK: 17.0000 g | PACK | Freq: Every day | ORAL | Status: DC

## 2022-09-24 MED ORDER — PHENYLEPHRINE 80 MCG/ML (10ML) SYRINGE FOR IV PUSH (FOR BLOOD PRESSURE SUPPORT)
PREFILLED_SYRINGE | INTRAVENOUS | Status: DC | PRN
  Administered 2022-09-24: 160 ug via INTRAVENOUS
  Administered 2022-09-24: 80 ug via INTRAVENOUS
  Administered 2022-09-24: 240 ug via INTRAVENOUS
  Administered 2022-09-24: 160 ug via INTRAVENOUS

## 2022-09-24 MED ORDER — FAMOTIDINE 20 MG PO TABS
20.0000 mg | ORAL_TABLET | Freq: Two times a day (BID) | ORAL | Status: DC
Start: 2022-09-24 — End: 2022-09-24

## 2022-09-24 MED ORDER — HYDROMORPHONE HCL 1 MG/ML IJ SOLN
0.5000 mg | INTRAMUSCULAR | Status: AC | PRN
  Administered 2022-09-24 (×3): 1 mg via INTRAVENOUS
  Filled 2022-09-24 (×3): qty 1

## 2022-09-24 MED ORDER — HEMOSTATIC AGENTS (NO CHARGE) OPTIME
TOPICAL | Status: DC | PRN
  Administered 2022-09-24 (×8): 1 via TOPICAL

## 2022-09-24 MED ORDER — FENTANYL CITRATE (PF) 250 MCG/5ML IJ SOLN
INTRAMUSCULAR | Status: DC | PRN
  Administered 2022-09-24: 100 ug via INTRAVENOUS
  Administered 2022-09-24 (×2): 50 ug via INTRAVENOUS

## 2022-09-24 MED ORDER — 0.9 % SODIUM CHLORIDE (POUR BTL) OPTIME
TOPICAL | Status: DC | PRN
  Administered 2022-09-24: 1000 mL
  Administered 2022-09-24: 2000 mL

## 2022-09-24 MED ORDER — MAGNESIUM SULFATE 2 GM/50ML IV SOLN
2.0000 g | Freq: Once | INTRAVENOUS | Status: AC
  Administered 2022-09-24: 2 g via INTRAVENOUS
  Filled 2022-09-24: qty 50

## 2022-09-24 MED ORDER — PROPOFOL 10 MG/ML IV BOLUS
INTRAVENOUS | Status: AC
  Filled 2022-09-24: qty 20

## 2022-09-24 MED ORDER — ORAL CARE MOUTH RINSE: 15.0000 mL | OROMUCOSAL | Status: DC

## 2022-09-24 MED ORDER — PHENYLEPHRINE HCL (PRESSORS) 10 MG/ML IV SOLN
INTRAVENOUS | Status: AC
  Filled 2022-09-24: qty 1

## 2022-09-24 MED ORDER — FENTANYL CITRATE (PF) 250 MCG/5ML IJ SOLN
INTRAMUSCULAR | Status: AC
  Filled 2022-09-24: qty 5

## 2022-09-24 MED ORDER — ONDANSETRON HCL 4 MG/2ML IJ SOLN
INTRAMUSCULAR | Status: AC
  Filled 2022-09-24: qty 2

## 2022-09-24 MED ORDER — PHENYLEPHRINE HCL-NACL 20-0.9 MG/250ML-% IV SOLN
INTRAVENOUS | Status: DC | PRN
  Administered 2022-09-24: 50 ug/min via INTRAVENOUS

## 2022-09-24 MED ORDER — LIDOCAINE HCL (PF) 2 % IJ SOLN
INTRAMUSCULAR | Status: AC
  Filled 2022-09-24: qty 5

## 2022-09-24 MED ORDER — ETOMIDATE 2 MG/ML IV SOLN
INTRAVENOUS | Status: AC
  Filled 2022-09-24: qty 10

## 2022-09-24 MED ORDER — MIDAZOLAM HCL 5 MG/5ML IJ SOLN
INTRAMUSCULAR | Status: DC | PRN
  Administered 2022-09-24: 2 mg via INTRAVENOUS

## 2022-09-24 MED ORDER — ROCURONIUM BROMIDE 10 MG/ML (PF) SYRINGE
PREFILLED_SYRINGE | INTRAVENOUS | Status: DC | PRN
  Administered 2022-09-24: 20 mg via INTRAVENOUS
  Administered 2022-09-24: 30 mg via INTRAVENOUS
  Administered 2022-09-24: 50 mg via INTRAVENOUS

## 2022-09-24 MED ORDER — DOCUSATE SODIUM 50 MG/5ML PO LIQD
100.0000 mg | Freq: Two times a day (BID) | ORAL | Status: DC
  Administered 2022-09-24: 100 mg
  Filled 2022-09-24: qty 10

## 2022-09-24 MED ORDER — MIDAZOLAM HCL 2 MG/2ML IJ SOLN
INTRAMUSCULAR | Status: AC
  Filled 2022-09-24: qty 2

## 2022-09-24 MED ORDER — HYDROMORPHONE HCL 1 MG/ML IJ SOLN
1.0000 mg | INTRAMUSCULAR | Status: DC | PRN
  Administered 2022-09-24 – 2022-09-27 (×15): 1 mg via INTRAVENOUS
  Filled 2022-09-24 (×15): qty 1

## 2022-09-24 MED ORDER — LORAZEPAM 2 MG/ML IJ SOLN
0.5000 mg | Freq: Four times a day (QID) | INTRAMUSCULAR | Status: DC | PRN
  Administered 2022-09-26 – 2022-09-27 (×3): 0.5 mg via INTRAVENOUS
  Filled 2022-09-24 (×3): qty 1

## 2022-09-24 MED ORDER — LACTATED RINGERS IV SOLN: INTRAVENOUS | Status: AC

## 2022-09-24 MED ORDER — PROPOFOL 1000 MG/100ML IV EMUL
0.0000 ug/kg/min | INTRAVENOUS | Status: DC
  Administered 2022-09-24 – 2022-09-25 (×3): 50 ug/kg/min via INTRAVENOUS
  Filled 2022-09-24 (×3): qty 100

## 2022-09-24 MED ORDER — ALBUMIN HUMAN 5 % IV SOLN
INTRAVENOUS | Status: AC
  Filled 2022-09-24: qty 500

## 2022-09-24 MED ORDER — DOCUSATE SODIUM 100 MG PO CAPS: 100.0000 mg | ORAL_CAPSULE | Freq: Two times a day (BID) | ORAL | Status: DC | PRN

## 2022-09-24 MED ORDER — DEXAMETHASONE SODIUM PHOSPHATE 10 MG/ML IJ SOLN
INTRAMUSCULAR | Status: DC | PRN
  Administered 2022-09-24: 10 mg via INTRAVENOUS

## 2022-09-24 MED ORDER — POLYETHYLENE GLYCOL 3350 17 G PO PACK: 17.0000 g | PACK | Freq: Every day | ORAL | Status: DC | PRN

## 2022-09-24 MED ORDER — LACTATED RINGERS IV SOLN: INTRAVENOUS | Status: DC | PRN

## 2022-09-24 MED ORDER — PHENYLEPHRINE HCL-NACL 20-0.9 MG/250ML-% IV SOLN
0.0000 ug/min | INTRAVENOUS | Status: DC
  Administered 2022-09-24 (×2): 190 ug/min via INTRAVENOUS
  Administered 2022-09-24: 60 ug/min via INTRAVENOUS
  Administered 2022-09-25: 100 ug/min via INTRAVENOUS
  Administered 2022-09-25: 110 ug/min via INTRAVENOUS
  Administered 2022-09-25: 160 ug/min via INTRAVENOUS
  Administered 2022-09-25 (×2): 150 ug/min via INTRAVENOUS
  Administered 2022-09-25: 80 ug/min via INTRAVENOUS
  Administered 2022-09-25: 150 ug/min via INTRAVENOUS
  Filled 2022-09-24 (×10): qty 250

## 2022-09-24 MED ORDER — FENTANYL CITRATE PF 50 MCG/ML IJ SOSY: 25.0000 ug | PREFILLED_SYRINGE | INTRAMUSCULAR | Status: DC | PRN

## 2022-09-24 MED ORDER — SODIUM CHLORIDE 0.9 % IV SOLN: 250.0000 mL | INTRAVENOUS | Status: DC

## 2022-09-24 MED ORDER — MAGNESIUM CITRATE PO SOLN: 0.5000 | Freq: Once | ORAL | Status: DC

## 2022-09-24 MED ORDER — HYDROMORPHONE HCL 1 MG/ML IJ SOLN
0.5000 mg | INTRAMUSCULAR | Status: DC | PRN
  Administered 2022-09-24: 0.5 mg via INTRAVENOUS
  Filled 2022-09-24: qty 0.5

## 2022-09-24 MED ORDER — PROPOFOL 500 MG/50ML IV EMUL
INTRAVENOUS | Status: DC | PRN
  Administered 2022-09-24: 50 ug/kg/min via INTRAVENOUS

## 2022-09-24 MED ORDER — SUGAMMADEX SODIUM 200 MG/2ML IV SOLN: INTRAVENOUS | Status: DC | PRN

## 2022-09-24 MED ORDER — PANTOPRAZOLE SODIUM 40 MG IV SOLR
40.0000 mg | INTRAVENOUS | Status: DC
  Administered 2022-09-24 – 2022-10-03 (×10): 40 mg via INTRAVENOUS
  Filled 2022-09-24 (×10): qty 10

## 2022-09-24 MED ORDER — ETOMIDATE 2 MG/ML IV SOLN
INTRAVENOUS | Status: DC | PRN
  Administered 2022-09-24: 20 mg via INTRAVENOUS

## 2022-09-24 MED ORDER — ORAL CARE MOUTH RINSE
15.0000 mL | OROMUCOSAL | Status: DC
  Administered 2022-09-24 – 2022-09-25 (×6): 15 mL via OROMUCOSAL

## 2022-09-24 MED ORDER — DEXAMETHASONE SODIUM PHOSPHATE 10 MG/ML IJ SOLN
INTRAMUSCULAR | Status: AC
  Filled 2022-09-24: qty 1

## 2022-09-24 MED ORDER — PROPOFOL 1000 MG/100ML IV EMUL
INTRAVENOUS | Status: AC
  Filled 2022-09-24: qty 100

## 2022-09-24 MED ORDER — SUCCINYLCHOLINE CHLORIDE 200 MG/10ML IV SOSY
PREFILLED_SYRINGE | INTRAVENOUS | Status: DC | PRN
  Administered 2022-09-24: 140 mg via INTRAVENOUS

## 2022-09-24 SURGICAL SUPPLY — 50 items
APL SWBSTK 6 STRL LF DISP (MISCELLANEOUS) ×1
APPLICATOR COTTON TIP 6 STRL (MISCELLANEOUS) ×1 IMPLANT
APPLICATOR COTTON TIP 6IN STRL (MISCELLANEOUS) ×1 IMPLANT
BAG COUNTER SPONGE SURGICOUNT (BAG) IMPLANT
BAG SPNG CNTER NS LX DISP (BAG)
BLADE EXTENDED COATED 6.5IN (ELECTRODE) IMPLANT
BLADE HEX COATED 2.75 (ELECTRODE) ×1 IMPLANT
COVER MAYO STAND STRL (DRAPES) IMPLANT
DRAPE LAPAROSCOPIC ABDOMINAL (DRAPES) ×1 IMPLANT
DRAPE WARM FLUID 44X44 (DRAPES) IMPLANT
DRSG TEGADERM 4X4.75 (GAUZE/BANDAGES/DRESSINGS) IMPLANT
ELECT REM PT RETURN 15FT ADLT (MISCELLANEOUS) ×1 IMPLANT
EVACUATOR SILICONE 100CC (DRAIN) IMPLANT
GAUZE PAD ABD 8X10 STRL (GAUZE/BANDAGES/DRESSINGS) IMPLANT
GAUZE SPONGE 4X4 12PLY STRL (GAUZE/BANDAGES/DRESSINGS) ×1 IMPLANT
GLOVE BIOGEL PI IND STRL 7.0 (GLOVE) ×1 IMPLANT
GLOVE ECLIPSE 8.0 STRL XLNG CF (GLOVE) ×1 IMPLANT
GLOVE INDICATOR 8.0 STRL GRN (GLOVE) ×2 IMPLANT
GOWN STRL REUS W/ TWL XL LVL3 (GOWN DISPOSABLE) ×2 IMPLANT
GOWN STRL REUS W/TWL XL LVL3 (GOWN DISPOSABLE) ×2
HANDLE SUCTION POOLE (INSTRUMENTS) IMPLANT
HEMOSTAT ARISTA ABSORB 3G PWDR (HEMOSTASIS) IMPLANT
HEMOSTAT SNOW SURGICEL 2X4 (HEMOSTASIS) IMPLANT
KIT BASIN OR (CUSTOM PROCEDURE TRAY) ×1 IMPLANT
KIT TURNOVER KIT A (KITS) IMPLANT
LIGASURE IMPACT 36 18CM CVD LR (INSTRUMENTS) IMPLANT
NS IRRIG 1000ML POUR BTL (IV SOLUTION) ×1 IMPLANT
PACK GENERAL/GYN (CUSTOM PROCEDURE TRAY) ×1 IMPLANT
POUCH OSTOMY 1 PC DRNBL  2 1/2 (OSTOMY) IMPLANT
POUCH OSTOMY 2 1/2 (OSTOMY) ×1
RELOAD PROXIMATE 75MM BLUE (ENDOMECHANICALS) ×2 IMPLANT
RELOAD STAPLE 75 3.8 BLU REG (ENDOMECHANICALS) IMPLANT
SPONGE DRAIN TRACH 4X4 STRL 2S (GAUZE/BANDAGES/DRESSINGS) IMPLANT
SPONGE T-LAP 18X18 ~~LOC~~+RFID (SPONGE) IMPLANT
STAPLER PROXIMATE 75MM BLUE (STAPLE) IMPLANT
STAPLER VISISTAT 35W (STAPLE) ×1 IMPLANT
SUCTION POOLE HANDLE (INSTRUMENTS)
SUT ETHILON 2 0 PS N (SUTURE) IMPLANT
SUT PDS AB 1 TP1 96 (SUTURE) IMPLANT
SUT SILK 2 0 (SUTURE)
SUT SILK 2-0 18XBRD TIE 12 (SUTURE) IMPLANT
SUT SILK 3 0 (SUTURE)
SUT SILK 3 0 SH CR/8 (SUTURE) IMPLANT
SUT SILK 3-0 18XBRD TIE 12 (SUTURE) IMPLANT
SUT VIC AB 2-0 SH 18 (SUTURE) IMPLANT
SUT VIC AB 3-0 SH 18 (SUTURE) IMPLANT
TAPE PAPER 3X10 WHT MICROPORE (GAUZE/BANDAGES/DRESSINGS) IMPLANT
TOWEL OR 17X26 10 PK STRL BLUE (TOWEL DISPOSABLE) ×2 IMPLANT
TRAY FOLEY MTR SLVR 16FR STAT (SET/KITS/TRAYS/PACK) IMPLANT
YANKAUER SUCT BULB TIP NO VENT (SUCTIONS) IMPLANT

## 2022-09-24 NOTE — Anesthesia Procedure Notes (Signed)
Procedure Name: Intubation Date/Time: 09/24/2022 3:13 PM  Performed by: Lovie Chol, CRNAPre-anesthesia Checklist: Patient identified, Emergency Drugs available, Suction available and Patient being monitored Patient Re-evaluated:Patient Re-evaluated prior to induction Oxygen Delivery Method: Circle System Utilized Preoxygenation: Pre-oxygenation with 100% oxygen Induction Type: IV induction, Rapid sequence and Cricoid Pressure applied Laryngoscope Size: Miller and 3 Grade View: Grade I Tube type: Oral Tube size: 7.5 mm Number of attempts: 1 Airway Equipment and Method: Stylet Placement Confirmation: ETT inserted through vocal cords under direct vision, positive ETCO2 and breath sounds checked- equal and bilateral Secured at: 24 cm Tube secured with: Tape Dental Injury: Teeth and Oropharynx as per pre-operative assessment  Comments: Patient in semi-sitting position for induction. NGT to suction and oral suction available.

## 2022-09-24 NOTE — Progress Notes (Addendum)
Went to check on patient after NG tube placement. He feels his abdominal pain is mildly better. He remains uncomfortable on supplemental O2. His HR is 110 bpm. Systolic pressure 96-105 mmhg. KUB ordered after NG placement shows possible free air under right hemidiaphragm.   Recommend proceeding to the operating room for exploratory laparotomy, possible bowel resection, possible ostomy. We also discussed the possibility of an open abdomen and need for future surgery. Risks of surgery including bleeding, infection, damage to surrounding structures, drain placement, need for additional procedures, prolonged hospital stay, as well as the risks of general anesthesia were discussed with the patient and he would like to proceed with surgery. Questions were welcomed and answered. His wife was also at the bedside.   This plan of care was discussed with Dr. Luisa Hart, my attending physician.  Type and screen ordered, hgb currently 12    Hosie Spangle, [facility] Please see Amion for pager number during day hours 7:00am-4:30pm

## 2022-09-24 NOTE — Op Note (Signed)
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  He was admitted for chemotherapy yesterday.  He developed the acute onset of severe abdominal pain this morning.  Upon evaluation he had severe right upper quadrant pain and localizing an ileus.  NG tube was placed but his pain persisted.  Given his acute abrupt state had concerns that he perforated.  He received his first dose of chemotherapy on Friday.  He was taken emergently to the operating room today for concerned due to perforation of colon secondary to B-cell lymphoma secondary to treatment.  Risk of bleeding, infection, death, DVT, wound infection, hernia, bowel resection, ostomy, injury to internal organs, unresectability, cardiovascular event, worsening of organ system, and death all reviewed today with patient and wife.  They understood the severity of the situation and agreed to proceed.  Description of procedure: The patient was met in the holding area.  He was quite ill and taken to quickly back to the operating room.  He is placed supine upon the operating table.  After induction of general esthesia Foley catheter was placed and nasogastric tube was already in place.  He received appropriate preoperative  antibiotics.  He was then prepped and draped in sterile fashion timeout performed.  Midline incision was used from the xiphoid down to the umbilicus.  Incision was taken down through the fat to the linea alba was encountered and opened with cautery.  Upon entry abdominal cavity fecal contents were noted quite freely.  This came from the right upper quadrant and a large necrotic mass was encountered involving the hepatic flexure as well as descending colon to the cecum.  The retroperitoneum was quite bulky as was the mesentery of the distal small bowel.  I was able to control of the perforation which was in the hepatic flexure.  Suction was used.  I divided the mid transverse colon with a GIA 75 stapler.  A second load was used to fire across the terminal ileum.  Of note a Meckel's diverticulum was encountered and was incorporated with this as well.  I then carefully used the LigaSure to dissect the mesentery.  There is so much tumor which was necrotic the entire right colon was replaced by what appeared to be lymphoma.  This was fused to the retroperitoneum and down to the duodenum.  Given the nature of this, is able to piecemeal resect the hepatic flexure and right colon since it was falling apart from tumor necrosis.  I was able to debride this as best as I could.  There is some of the posterior wall left behind since it was fused and I could not with any confidence find the ureter or duodenum.  This controlled the perforation though.  I did this all the way down around the appendix and then used the LigaSure to take her to go to the  mesentery carefully but it was full of tumor.  I was able to resect the area as best as I could.  We then irrigated.  Hemostasis was achieved with cautery.  There is significant tumor in the retroperitoneum that was unresectable at this point in time.  I used cautery as well as Surgicel snow and Arista for the tumor beds there was quite a oozing but this controlled the bleeding quite  well.  There was some other nodes noted within the mesentery of these were quite small.  Stomach was normal.  Liver felt normal.  Gallbladder normal.  Remainder of colon was normal.  I then placed a 19 round drain after irrigation of 4 L in the right upper quadrant.  We then brought a ileostomy out of the right lower quadrant through a cruciate incision in the fascia and a circular incision in the skin.  The bowel was pulled up and secured to the skin with 3-0 Vicryl.  We then secured the drain with 2-0 nylon.  Prior to maturing the ostomy, the fascia was closed with double-stranded #1 PDS.  Skin packed open with saline soaked gauze.  All counts were found to be correct.  The patient remained critically ill and was taken to the ICU.  He was stable.

## 2022-09-24 NOTE — Progress Notes (Signed)
HEMATOLOGY/ONCOLOGY INPATIENT PROGRESS NOTE  Date of Service: 09/24/2022  Inpatient Attending: .Tyrone Nine, MD   SUBJECTIVE Patient notes that he developed significant abdominal discomfort in the right hemiabdomen and epigastric area just after midnight last night when he took some of his pills.  Prior to this he notes he was feeling much better and was slowly advancing his diet. He did have a bowel movement yesterday.  Passing some flatus since midnight but no additional bowel movement.  Notes increasing abdominal distention with some belching but no overt nausea or vomiting. Informed by nurse office significant discomfort this morning. Patient was seen and is having abdominal guarding.  No rigidity or rebound at this time. Patient had an x-ray of the abdomen that shows some dilated small bowel loops.  Surgery has been consulted NG tube is being placed and patient has been made n.p.o..  OBJECTIVE:  NAD  PHYSICAL EXAMINATION: . Vitals:   09/23/22 2044 09/24/22 0049 09/24/22 0627 09/24/22 1014  BP: 113/65 (!) 143/129 113/71 108/69  Pulse: 68 70 (!) 101 (!) 108  Resp: 18 16 18 20   Temp: 97.9 F (36.6 C)  (!) 97.5 F (36.4 C) 98.7 F (37.1 C)  TempSrc: Oral  Oral Oral  SpO2: 94% 96% (!) 86% (!) 89%   GENERAL:alert, mild distress due to abdominal discomfort and distention.  Wife at bedside OROPHARYNX: MMM dry NECK: supple, no JVD LYMPH:  no palpable lymphadenopathy in the cervical, axillary or inguinal regions LUNGS: clear to auscultation b/l with normal respiratory effort HEART: regular rate & rhythm ABDOMEN: Hypoactive bowel sounds guarding and tenderness to palpation especially over the epigastric region and right hemiabdomen .  No overt rigidity or rebound. Extremity: no pedal edema PSYCH: alert & oriented x 3 with fluent speech NEURO: no focal motor/sensory deficits   MEDICAL HISTORY:  Past Medical History:  Diagnosis Date   Arthritis    High cholesterol      SURGICAL HISTORY: Past Surgical History:  Procedure Laterality Date   IR IMAGING GUIDED PORT INSERTION  09/20/2022   REPLACEMENT TOTAL KNEE  10/2018   ROTATOR CUFF REPAIR      SOCIAL HISTORY: Social History   Socioeconomic History   Marital status: Married    Spouse name: Not on file   Number of children: Not on file   Years of education: Not on file   Highest education level: Not on file  Occupational History   Not on file  Tobacco Use   Smoking status: Never   Smokeless tobacco: Never  Vaping Use   Vaping Use: Never used  Substance and Sexual Activity   Alcohol use: Not Currently   Drug use: Not Currently   Sexual activity: Not on file  Other Topics Concern   Not on file  Social History Narrative   Not on file   Social Determinants of Health   Financial Resource Strain: Not on file  Food Insecurity: No Food Insecurity (09/23/2022)   Hunger Vital Sign    Worried About Running Out of Food in the Last Year: Never true    Ran Out of Food in the Last Year: Never true  Transportation Needs: No Transportation Needs (09/23/2022)   PRAPARE - Administrator, Civil Service (Medical): No    Lack of Transportation (Non-Medical): No  Physical Activity: Not on file  Stress: Not on file  Social Connections: Not on file  Intimate Partner Violence: Not At Risk (09/23/2022)   Humiliation, Afraid, Rape, and Kick  questionnaire    Fear of Current or Ex-Partner: No    Emotionally Abused: No    Physically Abused: No    Sexually Abused: No    FAMILY HISTORY: Family History  Problem Relation Age of Onset   Atrial fibrillation Mother    Alzheimer's disease Mother    Leukemia Father    Diabetes Father    Colon cancer Neg Hx    Rectal cancer Neg Hx    Esophageal cancer Neg Hx    Stomach cancer Neg Hx     ALLERGIES:  has No Known Allergies.  MEDICATIONS:  Scheduled Meds:  allopurinol  100 mg Oral BID   Chlorhexidine Gluconate Cloth  6 each Topical Daily    magnesium citrate  0.5 Bottle Oral Once   melatonin  3 mg Oral QHS   multivitamin with minerals  1 tablet Oral Daily   pantoprazole  40 mg Oral Daily   polyethylene glycol  17 g Oral Daily   senna-docusate  2 tablet Oral BID   Continuous Infusions:  lactated ringers     piperacillin-tazobactam (ZOSYN)  IV 3.375 g (09/24/22 0551)   PRN Meds:.diphenhydrAMINE, HYDROmorphone (DILAUDID) injection, LORazepam, polyethylene glycol, prochlorperazine, traZODone  REVIEW OF SYSTEMS:   .10 Point review of Systems was done is negative except as noted above.  LABORATORY DATA:  I have reviewed the data as listed .    Latest Ref Rng & Units 09/24/2022    1:27 AM 09/23/2022    5:15 AM 09/22/2022    5:54 AM  CBC  WBC 4.0 - 10.5 K/uL 19.9  12.6  12.2   Hemoglobin 13.0 - 17.0 g/dL 16.1  09.6  04.5   Hematocrit 39.0 - 52.0 % 39.0  33.2  33.5   Platelets 150 - 400 K/uL 490  306  307    .    Latest Ref Rng & Units 09/24/2022    1:27 AM 09/23/2022    5:15 AM 09/22/2022    5:54 AM  CMP  Glucose 70 - 99 mg/dL 409  811  914   BUN 8 - 23 mg/dL 22  18  16    Creatinine 0.61 - 1.24 mg/dL 7.82  9.56  2.13   Sodium 135 - 145 mmol/L 141  139  139   Potassium 3.5 - 5.1 mmol/L 4.2  4.0  3.8   Chloride 98 - 111 mmol/L 105  104  104   CO2 22 - 32 mmol/L 26  26  25    Calcium 8.9 - 10.3 mg/dL 8.8  8.6  8.6   Total Protein 6.5 - 8.1 g/dL 6.6  5.9  6.0   Total Bilirubin 0.3 - 1.2 mg/dL 0.5  0.6  0.6   Alkaline Phos 38 - 126 U/L 69  52  54   AST 15 - 41 U/L 33  29  25   ALT 0 - 44 U/L 23  17  13     Component     Latest Ref Rng 09/19/2022 09/20/2022 09/21/2022  HCV Ab     NON REACTIVE  NON REACTIVE     Hepatitis B Surface Ag     NON REACTIVE  NON REACTIVE     Hep B Core Total Ab     NON REACTIVE  NON REACTIVE     Procalcitonin     ng/mL 0.22     Cortisol, Plasma     ug/dL 08.6     HIV Screen 4th Generation wRfx     Non  Reactive   Non Reactive    Uric Acid, Serum     3.7 - 8.6 mg/dL   5.4     I have  personally reviewed the radiological images as listed and agreed with the findings in the report. DG Abd Portable 1V  Result Date: 09/24/2022 CLINICAL DATA:  Abdominal pain. EXAM: PORTABLE ABDOMEN - 1 VIEW COMPARISON:  None Available. FINDINGS: Mildly dilated air-filled small bowel loops are noted concerning for ileus or possibly distal small bowel obstruction. No colonic dilatation is noted. No abnormal calcifications are noted. IMPRESSION: Mildly dilated air-filled small bowel loops are noted concerning for ileus or possibly distal small bowel obstruction. Electronically Signed   By: Lupita Raider M.D.   On: 09/24/2022 09:05   IR IMAGING GUIDED PORT INSERTION  Result Date: 09/20/2022 INDICATION: Colonic mass.  New diagnosis of lymphoma. EXAM: IMPLANTED PORT A CATH PLACEMENT WITH ULTRASOUND AND FLUOROSCOPIC GUIDANCE MEDICATIONS: Ancef 2 gm IV; The antibiotic was administered within an appropriate time interval prior to skin puncture. ANESTHESIA/SEDATION: Moderate (conscious) sedation was employed during this procedure. A total of Versed 2 mg and Fentanyl 100 mcg was administered intravenously. Moderate Sedation Time: 20 minutes. The patient's level of consciousness and vital signs were monitored continuously by radiology nursing throughout the procedure under my direct supervision. FLUOROSCOPY TIME:  Fluoroscopic dose; 0 mGy COMPLICATIONS: None immediate. PROCEDURE: The procedure, risks, benefits, and alternatives were explained to the patient. Questions regarding the procedure were encouraged and answered. The patient understands and consents to the procedure. The RIGHT neck and chest were prepped with chlorhexidine in a sterile fashion, and a sterile drape was applied covering the operative field. Maximum barrier sterile technique with sterile gowns and gloves were used for the procedure. A timeout was performed prior to the initiation of the procedure. Local anesthesia was provided with 1% lidocaine  with epinephrine. After creating a small venotomy incision, a micropuncture kit was utilized to access the internal jugular vein under direct, real-time ultrasound guidance. Ultrasound image documentation was performed. The microwire was kinked to measure appropriate catheter length. A subcutaneous port pocket was then created along the upper chest wall utilizing a combination of sharp and blunt dissection. The pocket was irrigated with sterile saline. A single lumen Non-ISP power injectable port was chosen for placement. The 8 Fr catheter was tunneled from the port pocket site to the venotomy incision. The port was placed in the pocket. The external catheter was trimmed to appropriate length. At the venotomy, an 8 Fr peel-away sheath was placed over a guidewire under fluoroscopic guidance. The catheter was then placed through the sheath and the sheath was removed. Final catheter positioning was confirmed and documented with a fluoroscopic spot radiograph. The port was accessed with a Huber needle, aspirated and flushed with heparinized saline. The port pocket incision was closed with interrupted 3-0 Vicryl suture then Dermabond was applied, including at the venotomy incision. Dressings were placed. The patient tolerated the procedure well without immediate post procedural complication. IMPRESSION: Successful placement of a RIGHT internal jugular approach power injectable Port-A-Cath. The tip of the catheter is positioned within the proximal RIGHT atrium. The catheter is ready for immediate use. Roanna Banning, MD [facility] Electronically Signed   By: Roanna Banning M.D.   On: 09/20/2022 14:44   ECHOCARDIOGRAM COMPLETE  Result Date: 09/20/2022    ECHOCARDIOGRAM REPORT   Patient Name:   Justin Cameron Date of Exam: 09/20/2022 Medical Rec #:  [id number]  Height:       68.0 in Accession #:    [id number]          Weight:       177.9 lb Date of  Birth:  1955-08-31            BSA:          1.945 m Patient Age:    66 years            BP:           100/63 mmHg Patient Gender: M                   HR:           80 bpm. Exam Location:  Inpatient Procedure: 2D Echo, Cardiac Doppler and Color Doppler Indications:    Chemo  History:        Patient has no prior history of Echocardiogram examinations.  Sonographer:    Lucy Antigua Referring Phys: [id number] [name]  Sonographer Comments: Strain was attempted. Echo machine @ [facility] doesn't have strain Neurosurgeon. IMPRESSIONS  1. Left ventricular ejection fraction, by estimation, is 55%. The left ventricle has low normal function. The left ventricle has no regional wall motion abnormalities.  2. Right ventricular systolic function is normal. The right ventricular size is normal. There is normal pulmonary artery systolic pressure.  3. The mitral valve is normal in structure. Trivial mitral valve regurgitation. No evidence of mitral stenosis.  4. The aortic valve is normal in structure. Aortic valve regurgitation is not visualized. No aortic stenosis is present.  5. The inferior vena cava is normal in size with greater than 50% respiratory variability, suggesting right atrial pressure of 3 mmHg. FINDINGS  Left Ventricle: Left ventricular ejection fraction, by estimation, is 50 to 55%. The left ventricle has low normal function. The left ventricle has no regional wall motion abnormalities. The left ventricular internal cavity size was normal in size. There is borderline left ventricular hypertrophy. Left ventricular diastolic parameters were normal. Right Ventricle: The right ventricular size is normal. No increase in right ventricular wall thickness. Right ventricular systolic function is normal. There is normal pulmonary artery systolic pressure. The tricuspid regurgitant velocity is 1.94 m/s, and  with an assumed right atrial pressure of 3 mmHg, the estimated right ventricular systolic pressure is 18.1 mmHg. Left  Atrium: Left atrial size was normal in size. Right Atrium: Right atrial size was normal in size. Pericardium: There is no evidence of pericardial effusion. Mitral Valve: The mitral valve is normal in structure. Trivial mitral valve regurgitation. No evidence of mitral valve stenosis. Tricuspid Valve: The tricuspid valve is normal in structure. Tricuspid valve regurgitation is trivial. No evidence of tricuspid stenosis. Aortic Valve: The aortic valve is normal in structure. Aortic valve regurgitation is not visualized. No aortic stenosis is present. Aortic valve mean gradient measures 2.0 mmHg. Aortic valve peak gradient measures 3.6 mmHg. Aortic valve area, by VTI measures 3.80 cm. Pulmonic Valve: The pulmonic valve was normal in structure. Pulmonic valve regurgitation is not visualized. No evidence of pulmonic stenosis. Aorta: The aortic root is normal in size and structure. Venous: The inferior vena cava is normal in size with greater than 50% respiratory variability, suggesting right atrial pressure of 3 mmHg. IAS/Shunts: No atrial level shunt detected by color flow Doppler.  LEFT VENTRICLE PLAX 2D LVIDd:         3.60 cm   Diastology LVIDs:  2.70 cm   LV e' medial:    10.00 cm/s LV PW:         1.20 cm   LV E/e' medial:  4.6 LV IVS:        1.00 cm   LV e' lateral:   9.36 cm/s LVOT diam:     2.20 cm   LV E/e' lateral: 4.9 LV SV:         74 LV SV Index:   38 LVOT Area:     3.80 cm  RIGHT VENTRICLE RV S prime:     10.90 cm/s TAPSE (M-mode): 1.5 cm LEFT ATRIUM             Index        RIGHT ATRIUM           Index LA Vol (A2C):   61.4 ml 31.58 ml/m  RA Area:     13.90 cm LA Vol (A4C):   58.4 ml 30.03 ml/m  RA Volume:   32.80 ml  16.87 ml/m LA Biplane Vol: 59.6 ml 30.65 ml/m  AORTIC VALVE AV Area (Vmax):    3.46 cm AV Area (Vmean):   3.17 cm AV Area (VTI):     3.80 cm AV Vmax:           94.70 cm/s AV Vmean:          64.800 cm/s AV VTI:            0.195 m AV Peak Grad:      3.6 mmHg AV Mean Grad:       2.0 mmHg LVOT Vmax:         86.20 cm/s LVOT Vmean:        54.000 cm/s LVOT VTI:          0.195 m LVOT/AV VTI ratio: 1.00  AORTA Ao Root diam: 2.90 cm Ao Asc diam:  3.60 cm MITRAL VALVE               TRICUSPID VALVE MV Area (PHT): 3.08 cm    TR Peak grad:   15.1 mmHg MV Decel Time: 246 msec    TR Vmax:        194.00 cm/s MR Peak grad: 32.5 mmHg MR Vmax:      285.00 cm/s  SHUNTS MV E velocity: 46.30 cm/s  Systemic VTI:  0.20 m MV A velocity: 61.30 cm/s  Systemic Diam: 2.20 cm MV E/A ratio:  0.76 Aditya Sabharwal Electronically signed by Dorthula Nettles Signature Date/Time: 09/20/2022/1:15:18 PM    Final    CT ABDOMEN PELVIS W CONTRAST  Result Date: 09/18/2022 CLINICAL DATA:  Colon cancer, anorexia, weakness, fever EXAM: CT ABDOMEN AND PELVIS WITH CONTRAST TECHNIQUE: Multidetector CT imaging of the abdomen and pelvis was performed using the standard protocol following bolus administration of intravenous contrast. RADIATION DOSE REDUCTION: This exam was performed according to the departmental dose-optimization program which includes automated exposure control, adjustment of the mA and/or kV according to patient size and/or use of iterative reconstruction technique. CONTRAST:  OMNIPAQUE IOHEXOL 300 MG/ML  SOLN COMPARISON:  08/12/2022 FINDINGS: Lower chest: There are 2 left lower lobe pulmonary nodules consistent with progressive pulmonary metastases. 1.4 cm nodule image 12/2 and a 0.8 cm nodule image 15/2. No airspace disease or effusion. Hepatobiliary: Gallbladder is moderately distended without cholelithiasis or cholecystitis. Stable cysts are seen within the liver. Since the prior exam, there is loss of normal fat plane between the inferior right lobe liver and and enlarging  colonic mass at the hepatic flexure. Direct invasion of the liver cannot be excluded. Pancreas: Unremarkable. No pancreatic ductal dilatation or surrounding inflammatory changes. Spleen: Normal in size without focal abnormality.  Adrenals/Urinary Tract: Bilateral adrenal masses seen previously have enlarged, consistent with metastatic disease. Right adrenal mass measures up to 5.1 cm, previously 3.5 cm. The left adrenal mass measures up to 4.7 cm, previously 3.5 cm. The kidneys enhance normally and symmetrically. No urinary tract calculi or obstructive uropathy. Bladder is unremarkable. Stomach/Bowel: A large necrotic mass at the hepatic flexure of the colon has increased in size, measuring up to 12.5 x 10.1 cm. There is increasing pericolonic fat stranding and mesenteric nodularity consistent with locally invasive disease. As discussed above, there is loss of normal fat plane between the hepatic mass and the inferior margin right lobe liver and direct invasion of the liver cannot be excluded. There is no evidence of bowel obstruction or ileus. Normal appendix right lower quadrant. Vascular/Lymphatic: Increase in the size and number of mesenteric lymph nodes surrounding the hepatic flexure mass, consistent with local nodal metastases. Largest lymph node measures 0.8 cm reference image 56/2. There is also a soft tissue mass contiguous with the left crus of the diaphragm, which appears separate from the enlarging left adrenal mass. This measures 3.5 x 3.0 cm, new since prior study and consistent with metastatic deposit. Stable atherosclerosis of the aorta and its branches. Reproductive: Prostate is unremarkable. Other: Trace pelvic free fluid. No free intraperitoneal gas. No abdominal wall hernia. Musculoskeletal: There are no acute or destructive bony abnormalities. Reconstructed images demonstrate no additional findings. IMPRESSION: 1. Enlarging mass at the hepatic flexure of the colon consistent with progressive colonic malignancy. There is increasing pericolonic fat stranding with loss of fat plane between the mass and the right lobe liver concerning for direct hepatic involvement. 2. Increasing mesenteric adenopathy, new soft tissue mass  along the crus of the left hemidiaphragm, and enlarging bilateral adrenal masses consistent with progressive intra-abdominal metastases. 3. New and enlarging left lower lobe pulmonary nodules consistent with progressive pulmonary metastatic disease. 4. Trace pelvic free fluid. 5.  Aortic Atherosclerosis (ICD10-I70.0). Electronically Signed   By: Sharlet Salina M.D.   On: 09/18/2022 18:46   DG Chest Port 1 View  Result Date: 09/18/2022 CLINICAL DATA:  Fever and vomiting EXAM: PORTABLE CHEST 1 VIEW COMPARISON:  Radiograph 09/04/2016 and CT chest 08/22/2022 FINDINGS: Stable cardiomediastinal silhouette. Aortic atherosclerotic calcification. No focal consolidation, pleural effusion, or pneumothorax. No displaced rib fractures. IMPRESSION: No active disease. Electronically Signed   By: Minerva Fester M.D.   On: 09/18/2022 17:37    ASSESSMENT & PLAN:   67 y.o. male with:  Acute worsening right hemiabdomen and epigastric discomfort-concern for partial large bowel obstruction versus microperforation versus significant inflammation due to necrosis of large right hepatic flexure tumor. Patient tolerated first dose of Rituxan 09/16/2022 without any notable reactions or toxicities. Newly diagnosed stage IVb large B-cell lymphoma ECHo done today showed normal EF of 55% -port a cath placed by IR -Hepatitis BC and HIV testing negative Large hepatic flexure mass from large B-cell lymphoma with impending obstruction and high risk for perforation.  Possible infection of the necrotic tissue in the tumor or bowel wall currently on antibiotics. 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  Patient is a lightheadedness and some orthostatic symptoms and severe fatigue.  Rule out adrenal insufficiency. Failure to thrive due to poor  p.o. intake bowel issues and new diagnosis of large B-cell  lymphoma. Dehydration due to poor p.o. intake Significant abdominal discomfort from his large colonic mass with impending obstruction. Arthritis Dyslipidemia History of gout 11.  History of obstructive sleep apnea not using CPAP at home. PLAN: -Labs done this morning show some element of hemoconcentration otherwise chemistry and uric acid within normal limits. -N.p.o. except for ice chips.. -Abd x-ray was reviewed  -surgery has been consulted -NG tube to suction for decompression -CT abdomen and pelvis to further evaluate acute worsening of abdominal pain. -Uric acid levels are normal and it is okay to hold allopurinol if needed. - Solu-Medrol 125 mg IV daily  -Continue IV Zosyn -Will hold off on starting his EPOCH chemotherapy at this time pending improvement in the patient's abdominal symptoms. -IV fluids hospitalist -SCD and ambulation for VTE prophylaxis no lovenox due to risk of GI bleeding -Based on how his abdominal pain behaves and surgery input over the next 24 to 48 hours will need to decide on next steps and timing of Chemotherapy.   .The total time spent in the appointment was 50 minutes* .  All of the patient's questions were answered with apparent satisfaction. The patient knows to call the clinic with any problems, questions or concerns.   Wyvonnia Lora MD MS AAHIVMS [facility] Hematology/Oncology Physician [facility]  .*Total Encounter Time as defined by the Centers for Medicare and Medicaid Services includes, in addition to the face-to-face time of a patient visit (documented in the note above) non-face-to-face time: obtaining and reviewing outside history, ordering and reviewing medications, tests or procedures, care coordination (communications with other health care professionals or caregivers) and documentation in the medical record.

## 2022-09-24 NOTE — Consult Note (Signed)
NAME:  Justin Cameron, MRN:  [id number], DOB:  02/09/1956, LOS: 5 ADMISSION DATE:  09/19/2022, CONSULTATION DATE:  09/24/22 REFERRING MD:  Jarvis Newcomer, CHIEF COMPLAINT:  Abd pain   History of Present Illness:  67 year old man w/ hx of OSA, recently diagnosed large B cell lymphoma causing threatened large bowel obstruction.  Came in after chemo b/c more bloating/pain.  NGT placed, O2 needs went from RA to 15LPM so PCCM consulted.  Denies any cough.  +dyspneic with minimal exertion.  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 rate (!) 21, height 5\' 8"  (1.727 m), weight 81 kg, SpO2 95 %.        Intake/Output Summary (Last 24 hours) at 09/24/2022 1145 Last data filed at 09/23/2022 1300 Gross per 24 hour  Intake 240 ml  Output --  Net 240 ml   Filed Weights   09/24/22 1105  Weight: 81 kg    Examination: General: appears mildly SOB HENT: NGT in place, minimal ouput Lungs: clear, no wheezing Cardiovascular: tachy, ext warm Abdomen: TTP R side with firmness c/w mass, no rebound Extremities: moves to command x 4, no edema, new port in place Neuro: nonfocal, moves everything Psych: RASS -1, Aox3  WBC up Plts up BMP looks okay Echo personally reviewed looks good  Resolved Hospital Problem list   N/A  Assessment & Plan:  Partial LBO from DLBCL mass s/p chemo w/ worsening symptoms; no peritoneal signs on exam at present Acute hypoxemic resp failure- unclear cause, CXR does not look bad, could be some splinting, at risk for VTE as well At risk TLS  - LE duplex - Encourage IS - Very high risk for hemorrhagic transformation so if LE duplex positive  would do IVC filter - Exact plans for diverting ostomy timing TBD, appreciate surgery input - Will follow  Best Practice (right click and "Reselect all SmartList Selections" daily)   Per primary  Labs   CBC: Recent Labs  Lab 09/20/22 0531 09/21/22 0415 09/22/22 0554 09/23/22 0515 09/24/22 0127  WBC 6.4 16.3* 12.2* 12.6* 19.9*  NEUTROABS 5.7 15.3* 10.9* 11.0* 17.8*  HGB 11.8* 11.2* 10.3* 10.2* 12.1*  HCT 37.6* 35.0* 33.5* 33.2* 39.0  MCV 78.7* 80.1 81.5 81.0 80.9  PLT 307 316 307 306 490*    Basic Metabolic Panel: Recent Labs  Lab 09/19/22 1713 09/20/22 0531 09/21/22 0415 09/22/22 0554 09/23/22 0515 09/24/22 0127  NA 131* 135 140 139 139 141  K 4.4 4.0 3.7 3.8 4.0 4.2  CL 94* 100 105 104 104 105  CO2 24 24 25 25 26 26   GLUCOSE 99 171* 149* 121* 111* 118*  BUN 14 14 15 16 18 22   CREATININE 0.89 0.72 0.78 0.72 0.71 0.74  CALCIUM 8.6* 8.8* 8.8* 8.6* 8.6* 8.8*  MG 1.8 2.2  --   --   --   --   PHOS 3.7 4.4  --   --   --   --    GFR: Estimated Creatinine Clearance: 89.1 mL/min (by C-G formula based on SCr of 0.74 mg/dL). Recent Labs  Lab 09/18/22 1730 09/19/22 1713 09/19/22 2119 09/19/22 2324 09/20/22 0531 09/21/22 0415 09/22/22 4098  09/23/22 0515 09/24/22 0127  PROCALCITON  --  0.22  --   --   --   --   --   --   --   WBC  --  10.6*  --   --    < > 16.3* 12.2* 12.6* 19.9*  LATICACIDVEN 2.4*  --  2.5* 1.9  --   --   --   --   --    < > = values in this interval not displayed.    Liver Function Tests: Recent Labs  Lab 09/20/22 0531 09/21/22 0415 09/22/22 0554 09/23/22 0515 09/24/22 0127  AST 24 29 25 29  33  ALT 15 14 13 17 23   ALKPHOS 65 62 54 52 69  BILITOT 0.9 1.2 0.6 0.6 0.5  PROT 6.7 6.6 6.0* 5.9* 6.6  ALBUMIN 3.0* 2.9* 2.8* 2.5* 3.1*   No results for input(s): "LIPASE", "AMYLASE" in the last 168 hours. No results for input(s): "AMMONIA" in the last 168 hours.  ABG No results found for: "PHART", "PCO2ART", "PO2ART", "HCO3", "TCO2",  "ACIDBASEDEF", "O2SAT"   Coagulation Profile: Recent Labs  Lab 09/19/22 1713  INR 1.1    Cardiac Enzymes: No results for input(s): "CKTOTAL", "CKMB", "CKMBINDEX", "TROPONINI" in the last 168 hours.  HbA1C: No results found for: "HGBA1C"  CBG: No results for input(s): "GLUCAP" in the last 168 hours.  Review of Systems:    Positive Symptoms in bold:  Constitutional fevers, chills, weight loss, fatigue, anorexia, malaise  Eyes decreased vision, double vision, eye irritation  Ears, Nose, Mouth, Throat sore throat, trouble swallowing, sinus congestion  Cardiovascular chest pain, paroxysmal nocturnal dyspnea, lower ext edema, palpitations   Respiratory SOB, cough, DOE, hemoptysis, wheezing  Gastrointestinal nausea, vomiting, diarrhea  Genitourinary burning with urination, trouble urinating  Musculoskeletal joint aches, joint swelling, back pain  Integumentary  rashes, skin lesions  Neurological focal weakness, focal numbness, trouble speaking, headaches  Psychiatric depression, anxiety, confusion  Endocrine polyuria, polydipsia, cold intolerance, heat intolerance  Hematologic abnormal bruising, abnormal bleeding, unexplained nose bleeds  Allergic/Immunologic recurrent infections, hives, swollen lymph nodes     Past Medical History:  He,  has a past medical history of Arthritis and High cholesterol.   Surgical History:   Past Surgical History:  Procedure Laterality Date   IR IMAGING GUIDED PORT INSERTION  09/20/2022   REPLACEMENT TOTAL KNEE  10/2018   ROTATOR CUFF REPAIR       Social History:   reports that he has never smoked. He has never used smokeless tobacco. He reports that he does not currently use alcohol. He reports that he does not currently use drugs.   Family History:  His family history includes Alzheimer's disease in his mother; Atrial fibrillation in his mother; Diabetes in his father; Leukemia in his father. There is no history of Colon cancer, Rectal  cancer, Esophageal cancer, or Stomach cancer.   Allergies No Known Allergies   Home Medications  Prior to Admission medications   Medication Sig Start Date End Date Taking? Authorizing Provider  ALPRAZolam (XANAX) 0.25 MG tablet Take 0.125-0.25 mg by mouth 3 (three) times daily as needed for anxiety.   Yes [provider]  colchicine 0.6 MG tablet Take 0.6 mg by mouth daily as needed (gout). 07/19/22  Yes [provider]  diphenhydramine-acetaminophen (TYLENOL PM) 25-500 MG TABS tablet Take 1 tablet by mouth at bedtime.   Yes [provider]  diphenhydrAMINE-zinc acetate (BENADRYL EXTRA STRENGTH) cream Apply 1 Application topically 3 (three) times daily  as needed for itching. 09/18/22  Yes Charlynne Pander, MD  ondansetron (ZOFRAN) 4 MG tablet TAKE 1 TABLET(4 MG) BY MOUTH EVERY 8 HOURS AS NEEDED FOR NAUSEA OR VOMITING 08/19/22  Yes Jenel Lucks, MD  simvastatin (ZOCOR) 20 MG tablet Take 20 mg by mouth every evening.   Yes [provider]  traZODone (DESYREL) 50 MG tablet Take 50-100 mg by mouth at bedtime as needed for sleep. 07/26/22  Yes [provider]  amoxicillin-clavulanate (AUGMENTIN) 875-125 MG tablet Take 1 tablet by mouth every 12 (twelve) hours. 09/18/22   Charlynne Pander, MD  dicyclomine (BENTYL) 20 MG tablet TAKE 1 TABLET(20 MG) BY MOUTH EVERY 6 HOURS Patient not taking: Reported on 09/19/2022 08/19/22   Jenel Lucks, MD     Critical care time: N/A

## 2022-09-24 NOTE — Progress Notes (Signed)
09/24/2022 Brief postop note Arrives on vent and low dose pressors, sedated JP with sanguinous output, ostomy looks okay, midline incision dressed without strikethrough Lungs relatively clear, passive on vent, ABG pending Check CXR, CBC, BMP Prop gtt with fent PRN Will take over from [facility] to help with vent liberation  Myrla Halsted MD PCCM

## 2022-09-24 NOTE — Progress Notes (Signed)
   09/24/22 2130  Vent Select  Invasive or Noninvasive Invasive  Adult Vent Y  Airway 7.5 mm  Placement Date/Time: 09/24/22 (c) 1513   Grade View: Grade 1  Airway Device: Endotracheal Tube  Laryngoscope Blade: Miller;3  ETT Types: Oral  Size (mm): 7.5 mm  Cuffed: Cuffed;Min.occ.pres.  Insertion attempts: 1  Airway Equipment: Stylet  Placement ...  Secured at (cm) 24 cm  Measured From Lips  Secured Location Right  Secured By Brewing technologist Repositioned Yes  Cuff Pressure (cm H2O) Green OR 18-26 CmH2O  Site Condition Dry  Adult Ventilator Settings  Vent Type Servo i  Humidity HME  Vent Mode PRVC  Vt Set 540 mL  Set Rate 24 bmp  FiO2 (%) (S)  60 % (titrated)  I Time 0.9 Sec(s)  I:E Ratio Set 1:1.8  PEEP 5 cmH20  Adult Ventilator Measurements  Peak Airway Pressure 14 L/min  Mean Airway Pressure 9 cmH20  Plateau Pressure 11 cmH20  Resp Rate Spontaneous 0 br/min  Resp Rate Total 24 br/min  Exhaled Vt 646 mL  Measured Ve 12.1 L  I:E Ratio Measured 1:1.8  Auto PEEP 0 cmH20  Total PEEP 5 cmH20  Adult Ventilator Alarms  Alarms On Y  Ve High Alarm 20 L/min  Ve Low Alarm 5 L/min  Resp Rate High Alarm 38 br/min  Resp Rate Low Alarm 10  PEEP Low Alarm 3 cmH2O  Press High Alarm 45 cmH2O  T Apnea 20 sec(s)  VAP Prevention  HOB> 30 Degrees Y  Breath Sounds  Bilateral Breath Sounds Clear  R Upper  Breath Sounds Clear  L Upper Breath Sounds Clear  Vent Respiratory Assessment  Respiratory Pattern Regular;Unlabored;Symmetrical  Suction Method  Respiratory Interventions Airway suction  Airway Suctioning/Secretions  Suction Type ETT  Suction Device  Catheter  Secretion Amount None  Suction Tolerance Tolerated well  Suctioning Adverse Effects None

## 2022-09-24 NOTE — H&P (View-Only) (Signed)
Went to check on patient after NG tube placement. He feels his abdominal pain is mildly better. He remains uncomfortable on supplemental O2. His HR is 110 bpm. Systolic pressure 96-105 mmhg. KUB ordered after NG placement shows possible free air under right hemidiaphragm.   Recommend proceeding to the operating room for exploratory laparotomy, possible bowel resection, possible ostomy. We also discussed the possibility of an open abdomen and need for future surgery. Risks of surgery including bleeding, infection, damage to surrounding structures, drain placement, need for additional procedures, prolonged hospital stay, as well as the risks of general anesthesia were discussed with the patient and he would like to proceed with surgery. Questions were welcomed and answered. His wife was also at the bedside.   This plan of care was discussed with Dr. Cornett, my attending physician.  Type and screen ordered, hgb currently 12    Lucille Witts, PA-C [facility] Please see Amion for pager number during day hours 7:00am-4:30pm       

## 2022-09-24 NOTE — Progress Notes (Signed)
Report called to [facility] for transfer to ICU.  Pt requiring higher level of oxygen .  Pt on 15 liters HFNC now.  Pt alert and orientated.  Wife at [facility].  NG tube placed prior to transfer.

## 2022-09-24 NOTE — Anesthesia Postprocedure Evaluation (Signed)
Anesthesia Post Note  Patient: Justin Cameron  Procedure(s) Performed: EXPLORATORY LAPAROTOMY, BOWEL RESECTION, ILEOSTOMY (Abdomen)     Patient location during evaluation: ICU Anesthesia Type: General Level of consciousness: patient remains intubated per anesthesia plan Pain management: pain level controlled Vital Signs Assessment: post-procedure vital signs reviewed and stable Respiratory status: patient remains intubated per anesthesia plan Cardiovascular status: blood pressure returned to baseline and stable Postop Assessment: no apparent nausea or vomiting Anesthetic complications: no   No notable events documented.  Last Vitals:  Vitals:   09/24/22 1728 09/24/22 1827  BP:    Pulse:    Resp:    Temp:  37.7 C  SpO2: 99%     Last Pain:  Vitals:   09/24/22 1827  TempSrc: Axillary  PainSc:                  Lowella Curb

## 2022-09-24 NOTE — Consult Note (Addendum)
Justin Cameron [birth year]  [id number].    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 He was admitted in order to get chemotherapy. His port was placed 5/24 with plans to start chemotherapy today.  Last night he developed severe abdominal pain after taking his PO meds. The pain is constant, worse in his right hemi-abdomen, worse with PO intake, worse when he tries to take a deep breath. He reports decreased flatus. His last BM was yesterday and was non-bloody. He is having some belching but denies vomiting. Per chart review he has never had abdominal surgery. He is not currently on blood thinners.   Seen by Dr. Romie Levee 5/20. At that time she did not recommend surgery for colonic mass, she recommended medical treatment of suspected lymphoma (final path was not back from colonoscopy 5/9).   ROS: Review of Systems  Constitutional:  Positive for weight loss.  Respiratory:  Positive for shortness of breath.   Gastrointestinal:  Positive for abdominal pain and constipation. Negative for blood in stool and melena.  All other systems reviewed and are negative.   Family History  Problem Relation Age of Onset   Atrial fibrillation Mother    Alzheimer's disease Mother    Leukemia Father    Diabetes Father    Colon cancer Neg Hx    Rectal cancer Neg Hx    Esophageal cancer Neg Hx    Stomach cancer Neg Hx     Past Medical History:  Diagnosis Date   Arthritis    High cholesterol     Past Surgical History:  Procedure Laterality Date   IR IMAGING GUIDED PORT INSERTION  09/20/2022   REPLACEMENT TOTAL KNEE  10/2018   ROTATOR CUFF REPAIR      Social History:   reports that he has never smoked. He has never used smokeless tobacco. He reports that he does not currently use alcohol. He reports that he does not currently use drugs.  Allergies: No Known Allergies  Medications Prior to Admission  Medication Sig Dispense Refill   ALPRAZolam (XANAX) 0.25 MG tablet Take 0.125-0.25 mg by mouth 3 (three) times daily as needed for anxiety.     colchicine 0.6 MG tablet Take 0.6 mg by mouth daily as needed (gout).     diphenhydramine-acetaminophen (TYLENOL PM) 25-500 MG TABS tablet Take 1 tablet by mouth at bedtime.     diphenhydrAMINE-zinc acetate (BENADRYL EXTRA STRENGTH) cream Apply 1 Application topically 3 (three) times daily as needed for itching. 28.4 g 0   ondansetron (ZOFRAN) 4 MG tablet TAKE 1 TABLET(4 MG) BY MOUTH EVERY 8 HOURS AS NEEDED FOR NAUSEA OR VOMITING 90 tablet 3   simvastatin (ZOCOR) 20 MG tablet Take 20 mg by mouth every evening.     traZODone (DESYREL) 50 MG tablet Take 50-100 mg by mouth at bedtime as needed for sleep.     amoxicillin-clavulanate (AUGMENTIN) 875-125 MG tablet Take 1 tablet by mouth every 12 (twelve) hours. 14 tablet 0   dicyclomine (BENTYL) 20 MG tablet TAKE 1 TABLET(20 MG) BY MOUTH EVERY 6 HOURS (Patient not taking: Reported on 09/19/2022) 90 tablet 3   [EXPIRED] traMADol (ULTRAM) 50 MG  tablet Take 50 mg by mouth every 6 (six) hours as needed for moderate pain.       Physical Exam: Blood pressure 113/71, pulse (!) 101, temperature (!) 97.5 F (36.4 C), temperature source Oral, resp. rate 18, SpO2 (!) 86 %. General: Pleasant white male laying on hospital bed, appears uncomfortable but not in acute distress HEENT: head -normocephalic, atraumatic; Eyes: PERRLA, no conjunctival injection Neck- Trachea is midline CV- RRR, normal S1/S2, no M/R/G, no lower extremity edema  Pulm- breathing is slightly labored  Abd- firm upper abdomen with some distention, soft lower abdomen,  mild pain, no rebound tenderness. No peritonitis.  Patient does wince with pain during inspiration, but less so when I palpate his abdomen.  GU- deferred  MSK- UE/LE symmetrical, no cyanosis, clubbing, or edema. Neuro- nonfocal exam, gait not assessed Psych- Alert and Oriented x3 with appropriate affect Skin: warm and dry, no rashes or lesions   Results for orders placed or performed during the hospital encounter of 09/19/22 (from the past 48 hour(s))  CBC with Differential/Platelet     Status: Abnormal   Collection Time: 09/23/22  5:15 AM  Result Value Ref Range   WBC 12.6 (H) 4.0 - 10.5 K/uL   RBC 4.10 (L) 4.22 - 5.81 MIL/uL   Hemoglobin 10.2 (L) 13.0 - 17.0 g/dL   HCT 16.1 (L) 09.6 - 04.5 %   MCV 81.0 80.0 - 100.0 fL   MCH 24.9 (L) 26.0 - 34.0 pg   MCHC 30.7 30.0 - 36.0 g/dL   RDW 40.9 (H) 81.1 - 91.4 %   Platelets 306 150 - 400 K/uL   nRBC 0.0 0.0 - 0.2 %   Neutrophils Relative % 88 %   Neutro Abs 11.0 (H) 1.7 - 7.7 K/uL   Lymphocytes Relative 4 %   Lymphs Abs 0.6 (L) 0.7 - 4.0 K/uL   Monocytes Relative 7 %   Monocytes Absolute 0.9 0.1 - 1.0 K/uL   Eosinophils Relative 0 %   Eosinophils Absolute 0.0 0.0 - 0.5 K/uL   Basophils Relative 0 %   Basophils Absolute 0.0 0.0 - 0.1 K/uL   Immature Granulocytes 1 %   Abs Immature Granulocytes 0.16 (H) 0.00 - 0.07 K/uL    Comment: Performed at [facility], [address] [place], [place] [address]  Comprehensive metabolic panel     Status: Abnormal   Collection Time: 09/23/22  5:15 AM  Result Value Ref Range   Sodium 139 135 - 145 mmol/L   Potassium 4.0 3.5 - 5.1 mmol/L   Chloride 104 98 - 111 mmol/L   CO2 26 22 - 32 mmol/L   Glucose, Bld 111 (H) 70 - 99 mg/dL    Comment: Glucose reference range applies only to samples taken after fasting for at least 8 hours.   BUN 18 8 - 23 mg/dL   Creatinine, Ser 6.21 0.61 - 1.24 mg/dL   Calcium 8.6 (L) 8.9 - 10.3 mg/dL   Total Protein 5.9 (L) 6.5 - 8.1 g/dL   Albumin 2.5 (L) 3.5 - 5.0 g/dL   AST 29 15 - 41 U/L   ALT  17 0 - 44 U/L   Alkaline Phosphatase 52 38 - 126 U/L   Total Bilirubin 0.6 0.3 - 1.2 mg/dL   GFR, Estimated >30 >86 mL/min    Comment: (NOTE) Calculated using the CKD-EPI Creatinine Equation (2021)    Anion gap 9 5 - 15    Comment: Performed at [facility],  [address] [place], [place] [address]  Uric acid     Status: Abnormal   Collection Time: 09/23/22  5:15 AM  Result Value Ref Range   Uric Acid, Serum 3.0 (L) 3.7 - 8.6 mg/dL    Comment: Performed at [facility], [address], [place], [place] [address]  CBC with Differential/Platelet     Status: Abnormal   Collection Time: 09/24/22  1:27 AM  Result Value Ref Range   WBC 19.9 (H) 4.0 - 10.5 K/uL   RBC 4.82 4.22 - 5.81 MIL/uL   Hemoglobin 12.1 (L) 13.0 - 17.0 g/dL   HCT 09.8 11.9 - 14.7 %   MCV 80.9 80.0 - 100.0 fL   MCH 25.1 (L) 26.0 - 34.0 pg   MCHC 31.0 30.0 - 36.0 g/dL   RDW 82.9 (H) 56.2 - 13.0 %   Platelets 490 (H) 150 - 400 K/uL   nRBC 0.0 0.0 - 0.2 %   Neutrophils Relative % 90 %   Neutro Abs 17.8 (H) 1.7 - 7.7 K/uL   Lymphocytes Relative 3 %   Lymphs Abs 0.7 0.7 - 4.0 K/uL   Monocytes Relative 5 %   Monocytes Absolute 1.0 0.1 - 1.0 K/uL   Eosinophils Relative 0 %   Eosinophils Absolute 0.0 0.0 - 0.5 K/uL   Basophils Relative 0 %   Basophils Absolute 0.1 0.0 - 0.1 K/uL   Immature Granulocytes 2 %   Abs Immature Granulocytes 0.37 (H) 0.00 - 0.07 K/uL    Comment: Performed at [facility], [address] [place], [place] [address]  Comprehensive metabolic panel     Status: Abnormal   Collection Time: 09/24/22  1:27 AM  Result Value Ref Range   Sodium 141 135 - 145 mmol/L   Potassium 4.2 3.5 - 5.1 mmol/L   Chloride 105 98 - 111 mmol/L   CO2 26 22 - 32 mmol/L   Glucose, Bld 118 (H) 70 - 99 mg/dL    Comment: Glucose reference range applies only to samples taken after fasting for at least 8 hours.   BUN 22 8 - 23 mg/dL   Creatinine, Ser 4.69 0.61 -  1.24 mg/dL   Calcium 8.8 (L) 8.9 - 10.3 mg/dL   Total Protein 6.6 6.5 - 8.1 g/dL   Albumin 3.1 (L) 3.5 - 5.0 g/dL   AST 33 15 - 41 U/L   ALT 23 0 - 44 U/L   Alkaline Phosphatase 69 38 - 126 U/L   Total Bilirubin 0.5 0.3 - 1.2 mg/dL   GFR, Estimated >62 >95 mL/min    Comment: (NOTE) Calculated using the CKD-EPI Creatinine Equation (2021)    Anion gap 10 5 - 15    Comment: Performed at [facility], [address] [place], [place] [address]  Uric acid     Status: Abnormal   Collection Time: 09/24/22  1:27 AM  Result Value Ref Range   Uric Acid, Serum 3.0 (L) 3.7 - 8.6 mg/dL    Comment: Performed at [facility], [address], [place], [place] [address]   DG Abd Portable 1V  Result Date: 09/24/2022 CLINICAL DATA:  Abdominal pain. EXAM: PORTABLE ABDOMEN - 1 VIEW COMPARISON:  None Available. FINDINGS: Mildly dilated air-filled small bowel loops are noted concerning for ileus or possibly distal small bowel obstruction. No colonic dilatation is noted. No abnormal calcifications are noted. IMPRESSION: Mildly dilated air-filled small bowel loops are noted concerning for ileus or  possibly distal small bowel obstruction. Electronically Signed   By: Lupita Raider M.D.   On: 09/24/2022 09:05      Assessment/Plan Acute on chronic abdominal pain  Partial large bowel obstriction in the setting of stage IV large B cell lymphoma  67 y/o M with acute increase in abdominal pain. He has a large hepatic flexure mass due to large B cell lymphoma with intra-abdominal and pulmonary metastasis. He currently has signs of a high grade partial large bowel obstruction due to this tumor. I recommend NG tube placement for decompression. Would recommend holding chemotherapy for at least 24 hours while we monitor his abdominal exam with NG decompression. No free air on KUB. No emergent surgical needs this morning but he should be monitored closely for signs of colonic  perforation. Will have to decide in the coming days if he should proceed with chemotherapy as planned or undergo surgery (likely diverting loop ileostomy) for decompression before proceeding with chemo. - port 5/24, received rituximab 5/24, on IV solumedrol 125mg  daily currently; Dr. Candise Che oncologist - echo 5/24 without cardiac abnormality   FEN - NPO, NG LIWS, IVF VTE - SCD's, chemical VTE held by oncology due to risk of bleeding ID - none currently Admit - TRH service    I reviewed nursing notes, Consultant oncology notes, hospitalist notes, last 24 h vitals and pain scores, last 48 h intake and output, last 24 h labs and trends, and last 24 h imaging results.  Adam Phenix, [facility] 09/24/2022, 9:39 AM Please see Amion for pager number during day hours 7:00am-4:30pm or 7:00am -11:30am on weekends

## 2022-09-24 NOTE — Interval H&P Note (Signed)
History and Physical Interval Note:  09/24/2022 2:19 PM  Justin Cameron  has presented today for surgery, with the diagnosis of Colon Perforation.  The various methods of treatment have been discussed with the patient and family. After consideration of risks, benefits and other options for treatment, the patient has consented to  Procedure(s): EXPLORATORY LAPAROTOMY, POSSIBLE BOWEL RESECTION, POSSIBLE COLOSTOMY (N/A) as a surgical intervention.  The patient's history has been reviewed, patient examined, no change in status, stable for surgery.  I have reviewed the patient's chart and labs.  Questions were answered to the patient's satisfaction.     The procedure has been discussed with the patient.  Alternative therapies have been discussed with the patient.  Operative risks include bleeding,  Infection,  Organ injury,  Nerve injury,  Blood vessel injury,  DVT,  Pulmonary embolism,  Death,  And possible reoperation.  Medical management risks include worsening of present situation.  The success of the procedure is 50 -90 % at treating patients symptoms.  The patient understands and agrees to proceed.   Zoi Devine A Ivey Cina

## 2022-09-24 NOTE — Anesthesia Preprocedure Evaluation (Addendum)
Anesthesia Evaluation  Patient identified by MRN, date of birth, ID band Patient awake    Reviewed: Allergy & Precautions, NPO status , Patient's Chart, lab work & pertinent test results  History of Anesthesia Complications Negative for: history of anesthetic complications  Airway Mallampati: III  TM Distance: >3 FB Neck ROM: Full    Dental  (+) Dental Advisory Given   Pulmonary neg shortness of breath, sleep apnea (does not use CPAP) , neg COPD, neg recent URI, former smoker   Pulmonary exam normal breath sounds clear to auscultation       Cardiovascular (-) hypertension(-) angina (-) Past MI, (-) Cardiac Stents and (-) CABG (-) dysrhythmias  Rhythm:Regular Rate:Normal  HLD  TTE 09/20/2022: IMPRESSIONS     1. Left ventricular ejection fraction, by estimation, is 55%. The left  ventricle has low normal function. The left ventricle has no regional wall  motion abnormalities.   2. Right ventricular systolic function is normal. The right ventricular  size is normal. There is normal pulmonary artery systolic pressure.   3. The mitral valve is normal in structure. Trivial mitral valve  regurgitation. No evidence of mitral stenosis.   4. The aortic valve is normal in structure. Aortic valve regurgitation is  not visualized. No aortic stenosis is present.   5. The inferior vena cava is normal in size with greater than 50%  respiratory variability, suggesting right atrial pressure of 3 mmHg.     Neuro/Psych negative neurological ROS     GI/Hepatic Neg liver ROS,neg GERD  ,,  Endo/Other  negative endocrine ROS    Renal/GU negative Renal ROS     Musculoskeletal  (+) Arthritis ,    Abdominal   Peds  Hematology  (+) Blood dyscrasia (large-cell non-Hodgkin's lymphoma)   Anesthesia Other Findings 67 y.o. male with a history of OSA, newly diagnosed metastatic DLBCL who [facility] for  ongoing abdominal pain, bloating, unintentional weight loss, night sweats. He had been seen in the ED 5/22 where CT showed an enlarging mass at the hepatic flexure, increased pericolonic ft stranding, increased mesenteric lymphadenopathy, enlarging bilateral adrenal masses, and new mass along crus of left diaphragm. There were also new and enlarging LLL pulmonary nodules. His symptoms were stable with IVF and he was discharged, but on return to the oncology clinic his symptoms seemed worse. He was referred for direct admission to expedite chemotherapy. Port placed 5/24, started rituximab with plans to stepwise initiate chemotherapy. On 5/28 he developed acute severe abdominal pain and distention with guarding on exam. XR consistent with distal obstruction, no free air. Surgery consulted. NG tube placed and he was transferred to the ICU for monitoring of hypoxia with PCCM consult.  Reproductive/Obstetrics                              Anesthesia Physical Anesthesia Plan  ASA: 4  Anesthesia Plan: General   Post-op Pain Management:    Induction: Intravenous and Rapid sequence  PONV Risk Score and Plan: 2 and Ondansetron, Dexamethasone and Treatment may vary due to age or medical condition  Airway Management Planned: Oral ETT  Additional Equipment:   Intra-op Plan:   Post-operative Plan: Possible Post-op intubation/ventilation  Informed Consent:      Dental advisory given  Plan Discussed with: CRNA and Anesthesiologist  Anesthesia Plan Comments: (Risks of general anesthesia discussed including, but not limited to, sore throat, hoarse voice, chipped/damaged teeth,  injury to vocal cords, nausea and vomiting, allergic reactions, lung infection, heart attack, stroke, and death. All questions answered.  Patient is 90% SpO2 on 15L by NRB. Discussed possible need for post-op intubation with patient and his wife.)         Anesthesia Quick Evaluation

## 2022-09-24 NOTE — Transfer of Care (Signed)
Immediate Anesthesia Transfer of Care Note  Patient: Justin Cameron  Procedure(s) Performed: EXPLORATORY LAPAROTOMY, BOWEL RESECTION, ILEOSTOMY (Abdomen)  Patient Location: ICU  Anesthesia Type:General  Level of Consciousness: Patient remains intubated per anesthesia plan  Airway & Oxygen Therapy: Patient remains intubated per anesthesia plan and Patient placed on Ventilator (see vital sign flow sheet for setting)  Post-op Assessment: Report given to RN and Post -op Vital signs reviewed and stable  Post vital signs: Reviewed and stable  Last Vitals:  Vitals Value Taken Time  BP    Temp    Pulse    Resp    SpO2      Last Pain:  Vitals:   09/24/22 1440  TempSrc: Oral  PainSc:       Patients Stated Pain Goal: 0 (09/20/22 2200)  Complications: No notable events documented.

## 2022-09-24 NOTE — Progress Notes (Addendum)
[facility] PROGRESS NOTE  Justin Cameron (DOB: Oct 08, 1955) UXL:[id number] PCP: Justin Floro, MD Outpatient Specialists: Oncology, Dr. Candise Cameron  Brief Narrative: Justin Cameron is a 67 y.o. male with a history of OSA, newly diagnosed metastatic DLBCL who [facility] on 09/[facility]/2022 for ongoing abdominal pain, bloating, unintentional weight loss, night sweats. He had been seen in the ED 5/22 where CT showed an enlarging mass at the hepatic flexure, increased pericolonic ft stranding, increased mesenteric lymphadenopathy, enlarging bilateral adrenal masses, and new mass along crus of left diaphragm. There were also new and enlarging LLL pulmonary nodules. His symptoms were stable with IVF and he was discharged, but on return to the oncology clinic his symptoms seemed worse. He was referred for direct admission to expedite chemotherapy. Port placed 5/24, started rituximab with plans to stepwise initiate chemotherapy. On 5/28 he developed acute severe abdominal pain and distention with guarding on exam. XR consistent with distal obstruction, no free air. Surgery consulted. NG tube placed and he was transferred to the ICU for monitoring of hypoxia with PCCM consult.  Subjective: About 5 minutes after taking medications in water last night around midnight he noticed abrupt onset of severe pain worst in RUQ for which IV dilaudid has been given several times with minimal improvement. Called by RN, saw patient quickly and he reports no BM since a small BM yesterday morning, no flatus since midnight. +Nausea, no vomiting. Denied trouble breathing or chest pain.   Objective: BP (!) 112/57 (BP Location: Right Arm)   Pulse (!) 115   Temp 98.2 F (36.8 C) (Oral)   Resp (!) 21   Ht 5\' 8"  (1.727 m)   Wt 81 kg   SpO2 95%   BMI 27.15 kg/m   Gen: 66yo M in apparent distress/discomfort Pulm: Clear, diminished at bases due to splinting, decreased excursions, tachypneic.  CV:  Regular tachycardia without MRG, no edema.  GI: Distended, tender with guarding, no bowel sounds. On 2nd attempt at placing NGT, no coiling in oropharynx and + bubble sound on insufflation of NGT.  Neuro: Alert and oriented. No new focal deficits. Ext: Warm, no deformities. Skin: No new rashes, lesions or ulcers on visualized skin   Assessment & Plan: DLBCL Stage IV:  - Port 5/24, started rituximab 5/24. EPOCH chemotherapy will need to be held currently. Initiation will depend on clinical progress and whether he needs surgery (would delay weeks) per my discussion with Dr. Candise Cameron. - Oncology recommended against pharmacologic VTE ppx due to risk of bleeding, so encouraged patient to ambulate often, SCDs - Adrenal masses noted. No specific symptoms/signs of insufficiency, and AM cortisol adequate at 16.3.  - Pre Tx PET scan ordered (inpt vs. outpatient) - Pre Tx echocardiogram appears normal, no cardiomyopathy.   Abdominal distention and pain: Obstruction suspected due to inflammation/necrosis of large right colon tumor invading liver, may have an element of perforation, though no free air on XR.  - Continue zosyn, NGT, IVF, IV dilaudid, Keep NPO.  - I continue to worry more about perforation given just how abrupt his symptoms seem to have developed. Surgery is following closely.  ADDENDUM: Follow up film confirmed better NG tube placement, coiled in fundus, decompressed but appears to have free air under right hemidiaphragm. Called surgery who will take him to the OR today.   Partial large bowel obstruction: Gastric and small bowel distention on XR, guarding on exam. All completely new since prior exam. Surgery consulted. Dr. Luisa Cameron met with myself,  Dr. Candise Cameron, pt, and his wife at bedside this morning.  - Hoping NGT decompression controls symptoms, though if not, would need proximal colostomy, perhaps in next 24 hours. Keep to suction. - Keep NPO, started IVF.  - Continue IV dilaudid prn  pain  Acute hypoxic respiratory failure: Splinting due to pain and abdominal distention contributing. Pt has no chest pain, though he has malignancy and is hospitalized with holding of anticoagulation per oncology due to risk of hemorrhage of mass(es), he has been getting up frequently and has no signs/symptoms of DVT on exam. Echo was normal and euvolemic by exam - ABG reassuring. - No wheezing. No infiltrate, large effusion, PTX on CXR.  - LE venous U/S, would need IVC filter if positive for DVT.  - Continue supplemental oxygen to maintain normal work of breathing and SpO2 >89%. May be stabilizing in ICU. Monitor closely.   Hyponatremia, dehydration, lactic acidosis: Resolved.   Moderate protein calorie malnutrition:  - Supplement protein as able  History of gout:  - Started allopurinol, uric acid level < 6. Remains at risk of TLS, will need to consider alternative agents (e.g. rasburicase), but defer to oncology at this time (touched base with Dr. Candise Cameron by chat).   Insomnia: Worsened by hospitalization and steroids.Seemed improved with trazodone, melatonin, holding now.   OSA:  - CPAP qHS  Justin Nine, MD [facility] [email or web address] 09/24/2022, 12:32 PM

## 2022-09-25 ENCOUNTER — Inpatient Hospital Stay: Payer: Self-pay

## 2022-09-25 ENCOUNTER — Inpatient Hospital Stay (HOSPITAL_COMMUNITY): Payer: Medicare Other

## 2022-09-25 ENCOUNTER — Encounter (HOSPITAL_COMMUNITY): Payer: Self-pay | Admitting: Surgery

## 2022-09-25 DIAGNOSIS — R609 Edema, unspecified: Secondary | ICD-10-CM

## 2022-09-25 DIAGNOSIS — I361 Nonrheumatic tricuspid (valve) insufficiency: Secondary | ICD-10-CM

## 2022-09-25 DIAGNOSIS — I340 Nonrheumatic mitral (valve) insufficiency: Secondary | ICD-10-CM

## 2022-09-25 DIAGNOSIS — C8330 Diffuse large B-cell lymphoma, unspecified site: Secondary | ICD-10-CM | POA: Diagnosis not present

## 2022-09-25 HISTORY — PX: IR IVC FILTER PLMT / S&I /IMG GUID/MOD SED: IMG701

## 2022-09-25 LAB — COMPREHENSIVE METABOLIC PANEL
ALT: 18 U/L (ref 0–44)
AST: 29 U/L (ref 15–41)
Albumin: 2.6 g/dL — ABNORMAL LOW (ref 3.5–5.0)
Alkaline Phosphatase: 44 U/L (ref 38–126)
Anion gap: 10 (ref 5–15)
BUN: 29 mg/dL — ABNORMAL HIGH (ref 8–23)
CO2: 22 mmol/L (ref 22–32)
Calcium: 7.8 mg/dL — ABNORMAL LOW (ref 8.9–10.3)
Chloride: 107 mmol/L (ref 98–111)
Creatinine, Ser: 1.34 mg/dL — ABNORMAL HIGH (ref 0.61–1.24)
GFR, Estimated: 59 mL/min — ABNORMAL LOW (ref >60)
Glucose, Bld: 148 mg/dL — ABNORMAL HIGH (ref 70–99)
Potassium: 4.2 mmol/L (ref 3.5–5.1)
Sodium: 139 mmol/L (ref 135–145)
Total Bilirubin: 1.1 mg/dL (ref 0.3–1.2)
Total Protein: 5.1 g/dL — ABNORMAL LOW (ref 6.5–8.1)

## 2022-09-25 LAB — BLOOD GAS, ARTERIAL
Acid-base deficit: 2.9 mmol/L — ABNORMAL HIGH (ref 0.0–2.0)
Acid-base deficit: 3.9 mmol/L — ABNORMAL HIGH (ref 0.0–2.0)
Bicarbonate: 20.8 mmol/L (ref 20.0–28.0)
Drawn by: 225631
FIO2: 100 %
FIO2: 50 %
MECHVT: 540 mL
MECHVT: 0.54 mL
O2 Saturation: 100 %
PEEP: 5 cmH2O
Patient temperature: 37
Patient temperature: 36.5
RATE: 18 resp/min
RATE: 24 resp/min
pCO2 arterial: 35 mmHg (ref 32–48)
pH, Arterial: 7.38 (ref 7.35–7.45)
pO2, Arterial: 142 mmHg — ABNORMAL HIGH (ref 83–108)

## 2022-09-25 LAB — CBC WITH DIFFERENTIAL/PLATELET
Abs Immature Granulocytes: 0.62 10*3/uL — ABNORMAL HIGH (ref 0.00–0.07)
Basophils Absolute: 0.1 10*3/uL (ref 0.0–0.1)
Basophils Relative: 0 %
Eosinophils Absolute: 0.1 10*3/uL (ref 0.0–0.5)
Eosinophils Relative: 0 %
HCT: 36.9 % — ABNORMAL LOW (ref 39.0–52.0)
Hemoglobin: 11.5 g/dL — ABNORMAL LOW (ref 13.0–17.0)
Immature Granulocytes: 2 %
Lymphocytes Relative: 2 %
Lymphs Abs: 0.6 10*3/uL — ABNORMAL LOW (ref 0.7–4.0)
MCH: 25.6 pg — ABNORMAL LOW (ref 26.0–34.0)
MCHC: 31.2 g/dL (ref 30.0–36.0)
MCV: 82.2 fL (ref 80.0–100.0)
Monocytes Absolute: 1.7 10*3/uL — ABNORMAL HIGH (ref 0.1–1.0)
Monocytes Relative: 5 %
Neutro Abs: 33.7 10*3/uL — ABNORMAL HIGH (ref 1.7–7.7)
Neutrophils Relative %: 91 %
Platelets: 439 10*3/uL — ABNORMAL HIGH (ref 150–400)
RBC: 4.49 MIL/uL (ref 4.22–5.81)
RDW: 16.9 % — ABNORMAL HIGH (ref 11.5–15.5)
WBC: 36.9 10*3/uL — ABNORMAL HIGH (ref 4.0–10.5)
nRBC: 0 % (ref 0.0–0.2)

## 2022-09-25 LAB — ECHOCARDIOGRAM LIMITED
Calc EF: 45.1 %
Height: 68 in
Single Plane A2C EF: 42.2 %
Single Plane A4C EF: 46.7 %
Weight: 2860.69 oz

## 2022-09-25 LAB — GLUCOSE, CAPILLARY
Glucose-Capillary: 115 mg/dL — ABNORMAL HIGH (ref 70–99)
Glucose-Capillary: 123 mg/dL — ABNORMAL HIGH (ref 70–99)
Glucose-Capillary: 125 mg/dL — ABNORMAL HIGH (ref 70–99)
Glucose-Capillary: 125 mg/dL — ABNORMAL HIGH (ref 70–99)
Glucose-Capillary: 137 mg/dL — ABNORMAL HIGH (ref 70–99)
Glucose-Capillary: 144 mg/dL — ABNORMAL HIGH (ref 70–99)

## 2022-09-25 LAB — URIC ACID: Uric Acid, Serum: 3.9 mg/dL (ref 3.7–8.6)

## 2022-09-25 LAB — LACTIC ACID, PLASMA: Lactic Acid, Venous: 3.7 mmol/L (ref 0.5–1.9)

## 2022-09-25 LAB — PHOSPHORUS: Phosphorus: 5.9 mg/dL — ABNORMAL HIGH (ref 2.5–4.6)

## 2022-09-25 LAB — TRIGLYCERIDES: Triglycerides: 239 mg/dL — ABNORMAL HIGH (ref <150)

## 2022-09-25 LAB — MAGNESIUM: Magnesium: 2.6 mg/dL — ABNORMAL HIGH (ref 1.7–2.4)

## 2022-09-25 MED ORDER — PERFLUTREN LIPID MICROSPHERE
1.0000 mL | INTRAVENOUS | Status: AC | PRN
  Administered 2022-09-25: 2 mL via INTRAVENOUS

## 2022-09-25 MED ORDER — FENTANYL CITRATE (PF) 100 MCG/2ML IJ SOLN
INTRAMUSCULAR | Status: AC | PRN
  Administered 2022-09-25: 50 ug via INTRAVENOUS

## 2022-09-25 MED ORDER — TRAVASOL 10 % IV SOLN
INTRAVENOUS | Status: AC
  Filled 2022-09-25: qty 508.8

## 2022-09-25 MED ORDER — HEPARIN SOD (PORK) LOCK FLUSH 100 UNIT/ML IV SOLN
500.0000 [IU] | INTRAVENOUS | Status: AC | PRN
  Administered 2022-09-25: 500 [IU]
  Filled 2022-09-25: qty 5

## 2022-09-25 MED ORDER — IOHEXOL 300 MG/ML  SOLN
50.0000 mL | Freq: Once | INTRAMUSCULAR | Status: AC | PRN
  Administered 2022-09-25: 7 mL via INTRAVENOUS

## 2022-09-25 MED ORDER — LIDOCAINE-EPINEPHRINE 1 %-1:100000 IJ SOLN
INTRAMUSCULAR | Status: AC
  Filled 2022-09-25: qty 1

## 2022-09-25 MED ORDER — PHENYLEPHRINE CONCENTRATED 100MG/250ML (0.4 MG/ML) INFUSION SIMPLE
0.0000 ug/min | INTRAVENOUS | Status: DC
  Administered 2022-09-25: 140 ug/min via INTRAVENOUS
  Filled 2022-09-25: qty 250

## 2022-09-25 MED ORDER — MIDAZOLAM HCL 2 MG/2ML IJ SOLN
INTRAMUSCULAR | Status: AC | PRN
  Administered 2022-09-25: 1 mg via INTRAVENOUS

## 2022-09-25 MED ORDER — DIPHENHYDRAMINE HCL 50 MG/ML IJ SOLN
25.0000 mg | Freq: Four times a day (QID) | INTRAMUSCULAR | Status: DC | PRN
  Administered 2022-09-25 – 2022-09-29 (×3): 25 mg via INTRAVENOUS
  Filled 2022-09-25 (×3): qty 1

## 2022-09-25 MED ORDER — MIDAZOLAM HCL 2 MG/2ML IJ SOLN
INTRAMUSCULAR | Status: AC
  Filled 2022-09-25: qty 2

## 2022-09-25 MED ORDER — SODIUM CHLORIDE 0.9% FLUSH: 10.0000 mL | INTRAVENOUS | Status: DC | PRN

## 2022-09-25 MED ORDER — SODIUM CHLORIDE 0.9% FLUSH
10.0000 mL | Freq: Two times a day (BID) | INTRAVENOUS | Status: DC
  Administered 2022-09-25 – 2022-09-27 (×4): 10 mL
  Administered 2022-09-27 – 2022-09-28 (×2): 20 mL
  Administered 2022-09-28 – 2022-09-29 (×3): 10 mL
  Administered 2022-09-30: 30 mL
  Administered 2022-09-30 – 2022-10-01 (×2): 40 mL
  Administered 2022-10-01: 20 mL
  Administered 2022-10-02: 10 mL
  Administered 2022-10-02: 40 mL
  Administered 2022-10-03: 10 mL
  Administered 2022-10-04: 30 mL
  Administered 2022-10-04: 10 mL
  Administered 2022-10-05: 30 mL
  Administered 2022-10-05 – 2022-10-07 (×3): 10 mL
  Administered 2022-10-07: 20 mL
  Administered 2022-10-08: 40 mL
  Administered 2022-10-08 – 2022-10-09 (×2): 10 mL
  Administered 2022-10-09: 20 mL
  Administered 2022-10-10: 10 mL
  Administered 2022-10-10: 40 mL
  Administered 2022-10-11: 10 mL

## 2022-09-25 MED ORDER — IOHEXOL 300 MG/ML  SOLN
100.0000 mL | Freq: Once | INTRAMUSCULAR | Status: AC | PRN
  Administered 2022-09-25: 30 mL via INTRAVENOUS

## 2022-09-25 MED ORDER — LACTATED RINGERS IV SOLN: INTRAVENOUS | Status: AC

## 2022-09-25 MED ORDER — LIDOCAINE-EPINEPHRINE 1 %-1:100000 IJ SOLN
20.0000 mL | Freq: Once | INTRAMUSCULAR | Status: AC
  Administered 2022-09-25: 2 mL via INTRADERMAL
  Filled 2022-09-25: qty 20

## 2022-09-25 MED ORDER — FENTANYL CITRATE (PF) 100 MCG/2ML IJ SOLN
INTRAMUSCULAR | Status: AC
  Filled 2022-09-25: qty 2

## 2022-09-25 NOTE — Procedures (Signed)
Extubation Procedure Note  Patient Details:   Name: SANJEET LIERMAN DOB: 08/26/1955 MRN: [id number]   Airway Documentation:    Vent end date: 09/25/22 Vent end time: 0746   Evaluation  O2 sats: stable throughout Complications: No apparent complications Patient did tolerate procedure well. Bilateral Breath Sounds: Clear   Yes  Suzan Garibaldi 09/25/2022, 7:46 AM

## 2022-09-25 NOTE — Progress Notes (Signed)
   09/24/22 2346  Vent Select  Invasive or Noninvasive Invasive  Adult Vent Y  Airway 7.5 mm  Placement Date/Time: 09/24/22 (c) 1513   Grade View: Grade 1  Airway Device: Endotracheal Tube  Laryngoscope Blade: Miller;3  ETT Types: Oral  Size (mm): 7.5 mm  Cuffed: Cuffed;Min.occ.pres.  Insertion attempts: 1  Airway Equipment: Stylet  Placement ...  Measured From Lips  Secured Location Left  Secured By Brewing technologist Repositioned Yes  Site Condition Dry  Adult Ventilator Settings  Vent Type Servo i  Humidity HME  Vent Mode PRVC  Vt Set 540 mL  Set Rate 24 bmp  FiO2 (%) (S)  50 % (Titrated FI02)  I Time 0.9 Sec(s)  I:E Ratio Set 1:1.8  PEEP 5 cmH20  Adult Ventilator Measurements  Peak Airway Pressure 22 L/min  Mean Airway Pressure 11 cmH20  Plateau Pressure 16 cmH20  Resp Rate Spontaneous 1 br/min  Resp Rate Total 25 br/min  Exhaled Vt 727 mL  Measured Ve 13.8 L  I:E Ratio Measured 1:1.8  Adult Ventilator Alarms  Alarms On Y  Ve High Alarm 20 L/min  Ve Low Alarm 5 L/min  Resp Rate High Alarm 38 br/min  Resp Rate Low Alarm 10  PEEP Low Alarm 3 cmH2O  Press High Alarm 45 cmH2O  T Apnea 20 sec(s)  VAP Prevention  HOB> 30 Degrees Y  Equipment wiped down Yes  Breath Sounds  Bilateral Breath Sounds Clear  R Upper  Breath Sounds Clear  L Upper Breath Sounds Clear  Vent Respiratory Assessment  Respiratory Pattern Regular;Unlabored;Symmetrical  Suction Method  Respiratory Interventions Airway suction  Airway Suctioning/Secretions  Suction Type ETT  Suction Device  Catheter  Secretion Amount None  Suction Tolerance Tolerated well  Suctioning Adverse Effects None

## 2022-09-25 NOTE — Progress Notes (Signed)
1 Day Post-Op   Subjective/Chief Complaint: PT EXTUBATED    Objective: Vital signs in last 24 hours: Temp:  [97.7 F (36.5 C)-99.9 F (37.7 C)] 97.7 F (36.5 C) (05/29 0300) Pulse Rate:  [63-130] 88 (05/29 0745) Resp:  [14-35] 24 (05/29 0745) BP: (74-142)/(45-82) 124/66 (05/29 0745) SpO2:  [89 %-100 %] 96 % (05/29 0751) FiO2 (%):  [50 %-100 %] 50 % (05/29 0433) Weight:  [81 kg-81.1 kg] 81.1 kg (05/29 0500) Last BM Date : 09/23/22  Intake/Output from previous day: 05/28 0701 - 05/29 0700 In: 5262.5 [I.V.:4219.8; NG/GT:60; IV Piggyback:982.7] Out: 1450 [Urine:705; Emesis/NG output:350; Drains:245; Blood:150] Intake/Output this shift: Total I/O In: 705.9 [I.V.:654.2; IV Piggyback:51.7] Out: -   Dressing dry and intact ostomy RLQ viable sweat in bag   Lab Results:  Recent Labs    09/24/22 1825 09/25/22 0439  WBC 42.6* 36.9*  HGB 11.4* 11.5*  HCT 37.5* 36.9*  PLT 472* 439*   BMET Recent Labs    09/24/22 1825 09/25/22 0439  NA 140 139  K 4.5 4.2  CL 105 107  CO2 23 22  GLUCOSE 115* 148*  BUN 29* 29*  CREATININE 1.53* 1.34*  CALCIUM 7.8* 7.8*   PT/INR No results for input(s): "LABPROT", "INR" in the last 72 hours. ABG Recent Labs    09/24/22 1532 09/25/22 0432  PHART 7.23* 7.38  HCO3 25.5 20.8    Studies/Results: DG Chest 1 View  Result Date: 09/24/2022 CLINICAL DATA:  Check endotracheal tube placement EXAM: PORTABLE CHEST 1 VIEW COMPARISON:  Film from earlier in the same day. FINDINGS: Gastric catheter is noted within the stomach. Endotracheal tube is noted in satisfactory position. Right chest wall port is seen and stable. Lungs are well aerated bilaterally. Minimal right effusion is seen. No acute bony abnormality is noted. IMPRESSION: Minimal right-sided effusion. Electronically Signed   By: Alcide Clever M.D.   On: 09/24/2022 20:25   DG CHEST PORT 1 VIEW  Result Date: 09/24/2022 CLINICAL DATA:  Evaluate gastric catheter placement EXAM: PORTABLE  CHEST 1 VIEW COMPARISON:  09/18/2022 FINDINGS: Cardiac shadow is stable but accentuated by the portable technique. The overall inspiratory effort is poor with right basilar atelectasis. Right chest wall port is seen in satisfactory position. No pneumothorax is noted. Gastric catheter is noted with the tip superimposed over the left mainstem bronchus. This may still lie within the mid esophagus. This should be withdrawn and readvanced. IMPRESSION: Gastric catheter as described. This should be withdrawn and readvanced into the stomach. Electronically Signed   By: Alcide Clever M.D.   On: 09/24/2022 15:09   DG Abd Portable 1V  Result Date: 09/24/2022 CLINICAL DATA:  Check gastric catheter placement EXAM: PORTABLE ABDOMEN - 1 VIEW COMPARISON:  Film from earlier in the same day. FINDINGS: Scattered large and small bowel gas is noted. Retained fecal material within the colon is seen. No free air is noted. Gastric catheter is not visualized. Chest x-ray may be helpful for further evaluation. IMPRESSION: Gastric catheter is not visualized on this exam. Electronically Signed   By: Alcide Clever M.D.   On: 09/24/2022 15:07   DG Abd 1 View  Result Date: 09/24/2022 CLINICAL DATA:  Nasogastric placement EXAM: ABDOMEN - 1 VIEW COMPARISON:  Earlier same day FINDINGS: Nasogastric tube enters the stomach and coils in the fundus. Less intestinal gas than was noted on the prior image. Lucency in the right upper quadrant was not seen on the previous images. Free intraperitoneal air is not excluded. If  there is clinical concern regarding this possibility, either left-side-down decubitus imaging or CT abdomen would be suggested. IMPRESSION: 1. Nasogastric tube enters the stomach and coils in the fundus. 2. Less intestinal gas than was noted on the prior image. 3. Lucency in the right upper quadrant was not seen on the previous images. Free intraperitoneal air is not excluded. See above discussion. Electronically Signed   By: Paulina Fusi M.D.   On: 09/24/2022 12:14   DG Abd Portable 1V  Result Date: 09/24/2022 CLINICAL DATA:  Abdominal pain. EXAM: PORTABLE ABDOMEN - 1 VIEW COMPARISON:  None Available. FINDINGS: Mildly dilated air-filled small bowel loops are noted concerning for ileus or possibly distal small bowel obstruction. No colonic dilatation is noted. No abnormal calcifications are noted. IMPRESSION: Mildly dilated air-filled small bowel loops are noted concerning for ileus or possibly distal small bowel obstruction. Electronically Signed   By: Lupita Raider M.D.   On: 09/24/2022 09:05    Anti-infectives: Anti-infectives (From admission, onward)    Start     Dose/Rate Route Frequency Ordered Stop   09/20/22 1402  ceFAZolin (ANCEF) IVPB 2g/100 mL premix        over 30 Minutes Intravenous Continuous PRN 09/20/22 1411 09/20/22 1402   09/20/22 0600  piperacillin-tazobactam (ZOSYN) IVPB 3.375 g        3.375 g 12.5 mL/hr over 240 Minutes Intravenous Every 8 hours 09/19/22 1943     09/19/22 1800  amoxicillin-clavulanate (AUGMENTIN) 875-125 MG per tablet 1 tablet  Status:  Discontinued        1 tablet Oral Every 12 hours 09/19/22 1659 09/19/22 1939       Assessment/Plan: s/p Procedure(s): EXPLORATORY LAPAROTOMY, BOWEL RESECTION, ILEOSTOMY (N/A) Start  TNA  Continue ABX  NPO FOR NOW   LOS: 6 days    Dortha Schwalbe MD  09/25/2022

## 2022-09-25 NOTE — Progress Notes (Signed)
   NAME:  Justin Cameron, MRN:  [id number], DOB:  07-May-1955, LOS: 6 ADMISSION DATE:  09/19/2022, CONSULTATION DATE:  09/24/22 REFERRING MD:  Jarvis Newcomer, CHIEF COMPLAINT:  Abd pain   History of Present Illness:  67 year old man w/ hx of OSA, recently diagnosed large B cell lymphoma causing threatened large bowel obstruction.  Came in after chemo b/c more bloating/pain.  NGT placed, O2 needs went from RA to 15LPM so PCCM consulted.  Denies any cough.  +dyspneic with minimal exertion.  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 rate (!) 24, height 5\' 8"  (1.727 m), weight 81.1 kg, SpO2 100 %.    Vent Mode: PRVC FiO2 (%):  [50 %-100 %] 50 % Set Rate:  [18 bmp-24 bmp] 24 bmp Vt Set:  [540 mL] 540 mL PEEP:  [5 cmH20] 5 cmH20 Plateau Pressure:  [11 cmH20-16 cmH20] 14 cmH20   Intake/Output Summary (Last 24 hours) at 09/25/2022 0739 Last data filed at 09/25/2022 0541 Gross per 24 hour  Intake 5262.48 ml  Output 1450 ml  Net 3812.48 ml    Filed Weights   09/24/22 1105 09/24/22 1413 09/25/22 0500  Weight: 81 kg 81 kg 81.1 kg    Examination: Awake on vent Follows commands JP small sanguinous output Abd midline incision dressed without strikethrough Ostomy no output yet No bowel sounds No edema  Duplex WBC down Thrombocytosis slightly better ABG benign  Resolved Hospital Problem list   N/A  Assessment & Plan:  Partial LBO from DLBCL mass s/p ex lap, diverting ostomy Postoperative vent management- should be okay to come off Hypoxemia present pre-op- derecruitment vs. Less  likely VTE At risk TLS- on allopurinol  - SAT/SBT - f/u LE duplex, very high risk for hemorrhagic transformation so if LE duplex positive would do IVC filter - Encourage IS - Drain management and diet per CCS - Further chemo on hold pending recovery from surgery - PT/OT help appreciated  Best Practice (right click and "Reselect all SmartList Selections" daily)   Best practice:  Diet: NPO Pain/Anxiety/Delirium protocol (if indicated): weaning VAP protocol (if indicated): in place DVT prophylaxis: SCD GI prophylaxis: PPI Glucose control: SSI q4h Mobility: up to chair Code Status: full  Family Communication: updated at bedside Disposition: ICU pending vent liberation and improved O2 needs  31 min cc time Myrla Halsted MD PCCM

## 2022-09-25 NOTE — Procedures (Signed)
Interventional Radiology Procedure Note  Procedure: IVC filter placement  Findings: Please refer to procedural dictation for full description. Denali infrarenal IVC filter placed via right IJ access.  Complications: None immediate  Estimated Blood Loss: <5 mL  Recommendations: IR will arrange for 3 month outpatient evaluation for possible filter retrieval.   Marliss Coots, MD Pager: [phone]

## 2022-09-25 NOTE — Progress Notes (Signed)
eLink Physician-Brief Progress Note Patient Name: Justin Cameron DOB: [birth year] MRN: [id number]   Date of Service  09/25/2022  HPI/Events of Note  67 year old male in ICU post right hemicolectomy and end ileostomy for perforated right colon.  He is intubated and sedated.  eICU Interventions  Labs and imaging reviewed.  ABG ordered for the morning for possible extubation.     Intervention Category Evaluation Type: Other  Justin Cameron 09/25/2022, 1:09 AM

## 2022-09-25 NOTE — Consult Note (Signed)
WOC Nurse ostomy consult note Stoma type/location: RLQ, end ileostomy Stomal assessment/size: 1 3/4" round, budded, pink, pale Peristomal assessment: NA Treatment options for stomal/peristomal skin: NA Output none Ostomy pouching: 1pc. Soft convex pouch in place from OR, does not appear patient will need convexity  Education provided:  Met with patient, he has recently been extubated. He is very tired and in quite a bit of pain. He has given me permission to contact his wife [name] to arrange a visit for tomorrow with her to be present for initial teaching.  Enrolled patient in [place] Secure Start DC program: Yes  Contacted Mrs. Boozer via phone will see with patient 09/26/22 between 9-930am  WOC Nurse will follow along with you for continued support with ostomy teaching and care Justin Cameron [facility] MSN, RN, [place], CNS, [place] [phone]

## 2022-09-25 NOTE — Consult Note (Addendum)
WOC consulted for new ileostomy will check in with patient today for teaching and support needs. Begin ostomy teaching, pouch change 09/26/22.   Karli [facility], CNS, CWON-AP [phone]   .ostom

## 2022-09-25 NOTE — Progress Notes (Signed)
RT note: ABG obtained/labelled/sent to Lab.

## 2022-09-25 NOTE — Progress Notes (Signed)
Peripherally Inserted Central Catheter Placement  The IV Nurse has discussed with the patient and/or persons authorized to consent for the patient, the purpose of this procedure and the potential benefits and risks involved with this procedure.  The benefits include less needle sticks, lab draws from the catheter, and the patient may be discharged home with the catheter. Risks include, but not limited to, infection, bleeding, blood clot (thrombus formation), and puncture of an artery; nerve damage and irregular heartbeat and possibility to perform a PICC exchange if needed/ordered by physician.  Alternatives to this procedure were also discussed.  Bard Power PICC patient education guide, fact sheet on infection prevention and patient information card has been provided to patient /or left at bedside.    PICC Placement Documentation  PICC Triple Lumen 09/25/22 Right Basilic 39 cm 0 cm (Active)  Indication for Insertion or Continuance of Line Administration of hyperosmolar/irritating solutions (i.e. TPN, Vancomycin, etc.) 09/25/22 1044  Exposed Catheter (cm) 0 cm 09/25/22 1044  Site Assessment Clean, Dry, Intact 09/25/22 1044  Lumen #1 Status Flushed;Blood return noted;Saline locked 09/25/22 1044  Lumen #2 Status Flushed;Blood return noted;Saline locked 09/25/22 1044  Lumen #3 Status Flushed;Blood return noted;Saline locked 09/25/22 1044  Dressing Type Transparent 09/25/22 1044  Dressing Status Antimicrobial disc in place 09/25/22 1044  Safety Lock Not Applicable 09/25/22 1044  Line Care Connections checked and tightened 09/25/22 1044  Line Adjustment (NICU/IV Team Only) No 09/25/22 1044  Dressing Intervention New dressing 09/25/22 1044  Dressing Change Due 10/02/22 09/25/22 1044       Kweli Grassel Ramos 09/25/2022, 10:46 AM

## 2022-09-25 NOTE — Progress Notes (Signed)
Patient was placed on CPAP/PS 5/5 by MD. MD placed order to extubate. Pt extubated with no complications at this time.

## 2022-09-25 NOTE — Progress Notes (Signed)
Latest Reference Range & Units 09/25/22 04:32  Delivery systems  VENTILATOR  Mode  PRESSURE REGULATED VOLUME CONTROL  FIO2 % 50.00  RATE resp/min 24  PEEP cm H20 5.0  MECHVT mL 0.540  pH, Arterial 7.35 - 7.45  7.38  pCO2 arterial 32 - 48 mmHg 35  pO2, Arterial 83 - 108 mmHg 142 (H)  Acid-base deficit 0.0 - 2.0 mmol/L 3.9 (H)  Bicarbonate 20.0 - 28.0 mmol/L 20.8  O2 Saturation % 100  Patient temperature  36.5  Collection site  LEFT RADIAL  Allens test (pass/fail) PASS  PASS  (H): Data is abnormally high A.M. ABG Results 09/25/2022

## 2022-09-25 NOTE — Progress Notes (Signed)
PHARMACY - TOTAL PARENTERAL NUTRITION CONSULT NOTE   Indication: Prolonged ileus  Patient Measurements: Height: 5\' 8"  (172.7 cm) Weight: 81.1 kg (178 lb 12.7 oz) IBW/kg (Calculated) : 68.4 TPN AdjBW (KG): 81 Body mass index is 27.19 kg/m. 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  He was supposed to start chemo, but developed severe acute abdominal pain on 5/28.  NG tube placed, imaging to confirm placement shows possible free air and he was taken to OR on 5/28.  Pharmacy is consulted to dose TPN for expected prolonged ileus.    Glucose / Insulin: No hx DM, no PTA medications.   - CBGs 120-153 - Solumedrol 250mg  5/23 and 5/24, solumedrol 125mg  5/25 and 5/26, dexamethasone 10mg  5/28 - sensitive SSI: 3 units / 24 hrs Electrolytes: WNL except elevated Phos 5.9, Mag 2.6 (Mag 2g on 5/28).  CorrCa 8.92 Renal: SCr elevated at 1.34 (baseline ~0.7), BUN 29 Hepatic: AST/ALT, Tbili WNL.  Trig 239 while on propofol (now d/c) Intake / Output: + 3.8 L mIVF: LR @ 100 ml/hr Output: NG , blood , drains , urine  GI Imaging: 5/28  Lucency in the right upper quadrant was not seen on the previous images. Free intraperitoneal air is not excluded.  GI Surgeries / Procedures: 5/28 Exploratory laparotomy with right hemicolectomy and end ileostomy   Central access: Port (09/20/22) TPN start date: 5/29  Nutritional Goals: Goal TPN rate of 100 mL/hr provides 127 g of protein and 2453 kcals per day  RD Assessment: Estimated Needs Total Energy Estimated Needs: 2400-2600 kcals Total Protein Estimated Needs: 120-130 gm Total Fluid Estimated Needs: >/= 2.4 L  Current Nutrition:  NPO Propofol: 5/28-5/29, now off  Plan:  Start TPN at 40 mL/hr at 1800 Electrolytes in TPN: Na 55mEq/L, K 64mEq/L, Ca 78mEq/L, Mg 60mEq/L, and Phos  15mmol/L. Cl:Ac 1:1 Add standard MVI and trace elements to TPN Continue sensitive SSI q4h and adjust as needed  Reduce MIVF to 60 mL/hr at 1800 Monitor TPN labs on Mon/Thurs, and PRN.  Recheck TG.   Lynann Beaver PharmD, BCPS [facility] main pharmacy [phone] 09/25/2022 9:03 AM

## 2022-09-25 NOTE — Progress Notes (Signed)
Referring Physician(s): Smith,Dan  Supervising Physician: Marliss Coots  Patient Status:  [facility] [place] - In-pt  Chief Complaint: Lymphoma, lower extremity DVT   Subjective: Pt known to IR team from port a cath placement on 09/20/22. He is a  67 y.o. male with PMH HLD, arthritis, OSA and newly diagnosed DLBCL who was admitted to [facility] from [facility] on 5/23 with persistent abd pain, fatigue, nausea, neck pain, lightheadedness, night sweats, weight loss, poor oral intake. He is s/p expl lap with rt hemicolectomy and end ileostomy on 5/28 for perforation of rt colon into RP due to extensive B cell lymphoma involvement. LE venous doppler study today revealed:  RIGHT:  - Findings consistent with acute deep vein thrombosis involving the right  peroneal veins, and right posterior tibial veins.    LEFT:  - Findings consistent with acute deep vein thrombosis involving the left  peroneal veins   He is extubated, afebrile, BP soft (on phenylephrine), WBC 36.9, hgb 11.5, plts 439k, creat 1.34, PT/INR nl. Blood cx neg. He does have lower abd discomfort. NG tube in place. Due to very high risk for hemorrhagic transformation of lymphoma request now received from CCM for IVC filter placement.      Past Medical History:  Diagnosis Date   Arthritis    High cholesterol    Past Surgical History:  Procedure Laterality Date   IR IMAGING GUIDED PORT INSERTION  09/20/2022   LAPAROTOMY N/A 09/24/2022   Procedure: EXPLORATORY LAPAROTOMY, BOWEL RESECTION, ILEOSTOMY;  Surgeon: Harriette Bouillon, MD;  Location: [facility] ORS;  Service: General;  Laterality: N/A;   REPLACEMENT TOTAL KNEE  10/2018   ROTATOR CUFF REPAIR       Allergies: Patient has no allergy information on record.  Medications: Prior to Admission medications   Medication Sig Start Date End Date Taking? Authorizing Provider  ALPRAZolam (XANAX) 0.25 MG tablet Take 0.125-0.25 mg by mouth 3 (three) times daily as needed for anxiety.   Yes [provider]  colchicine 0.6 MG tablet Take 0.6 mg by mouth daily as needed (gout). 07/19/22  Yes [provider]  diphenhydramine-acetaminophen (TYLENOL PM) 25-500 MG TABS tablet Take 1 tablet by mouth at bedtime.   Yes [provider]  diphenhydrAMINE-zinc acetate (BENADRYL EXTRA STRENGTH) cream Apply 1 Application topically 3 (three) times daily as needed for itching. 09/18/22  Yes Charlynne Pander, MD  ondansetron (ZOFRAN) 4 MG tablet TAKE 1 TABLET(4 MG) BY MOUTH EVERY 8 HOURS AS NEEDED FOR NAUSEA OR VOMITING 08/19/22  Yes Jenel Lucks, MD  simvastatin (ZOCOR) 20 MG tablet Take 20 mg by mouth every evening.   Yes [provider]  traZODone (DESYREL) 50 MG tablet Take 50-100 mg by mouth at bedtime as needed for sleep. 07/26/22  Yes [provider]  amoxicillin-clavulanate (AUGMENTIN) 875-125 MG tablet Take 1 tablet by mouth every 12 (twelve) hours. 09/18/22   Charlynne Pander, MD  dicyclomine (BENTYL) 20 MG tablet TAKE 1 TABLET(20 MG) BY MOUTH EVERY 6 HOURS Patient not taking: Reported on 09/19/2022 08/19/22   Jenel Lucks, MD     Vital Signs: BP (!) 94/56   Pulse 100   Temp (!) 97.5 F (36.4 C) (Oral)   Resp (!) 25   Ht 5\' 8"  (1.727 m)   Wt 178 lb 12.7 oz (81.1 kg)   SpO2 91%   BMI 27.19 kg/m   Physical Exam; pt lethargic, answers few simple questions, not sure what day it is; chest- sl dim  BS rt base, left clear; rt chest port intact, clean; heart- sl tachy but reg rhythm; abd- midline wound covered with bandage, rt surgical drain in place with blood-tinged fluid; ostomy intact; no sig LE edema  Imaging: VAS [place] LOWER EXTREMITY VENOUS (DVT)  Result Date: 09/25/2022  Lower Venous DVT Study Patient Name:  Justin Cameron  Date of Exam:   09/25/2022 Medical Rec #: [id number]            Accession #:    [id number] Date of Birth: 02/08/1956             Patient Gender: M Patient Age:   21 years Exam Location:  [facility] [place] Procedure:       VAS [place] LOWER EXTREMITY VENOUS (DVT) Referring Phys: Hosie Spangle --------------------------------------------------------------------------------  Indications: Edema.  Risk Factors: Chemotherapy Cancer Lymphoma. Comparison Study: No previous exams Performing Technologist: Jody Hill RVT, RDMS  Examination Guidelines: A complete evaluation includes B-mode imaging, spectral Doppler, color Doppler, and power Doppler as needed of all accessible portions of each vessel. Bilateral testing is considered an integral part of a complete examination. Limited examinations for reoccurring indications may be performed as noted. The reflux portion of the exam is performed with the patient in reverse Trendelenburg.  +---------+---------------+---------+-----------+----------+--------------+ RIGHT    CompressibilityPhasicitySpontaneityPropertiesThrombus Aging +---------+---------------+---------+-----------+----------+--------------+ CFV      Full           Yes      Yes                                 +---------+---------------+---------+-----------+----------+--------------+ SFJ      Full                                                        +---------+---------------+---------+-----------+----------+--------------+ FV Prox  Full           Yes      Yes                                 +---------+---------------+---------+-----------+----------+--------------+ FV Mid   Full           Yes      Yes                                 +---------+---------------+---------+-----------+----------+--------------+ FV DistalFull           Yes      Yes                                 +---------+---------------+---------+-----------+----------+--------------+ PFV      Full                                                        +---------+---------------+---------+-----------+----------+--------------+ POP      Full           Yes      Yes                                  +---------+---------------+---------+-----------+----------+--------------+  PTV      None           No       No                   Acute          +---------+---------------+---------+-----------+----------+--------------+ PERO     None           No       No                   Acute          +---------+---------------+---------+-----------+----------+--------------+   +---------+---------------+---------+-----------+----------+--------------+ LEFT     CompressibilityPhasicitySpontaneityPropertiesThrombus Aging +---------+---------------+---------+-----------+----------+--------------+ CFV      Full           Yes      Yes                                 +---------+---------------+---------+-----------+----------+--------------+ SFJ      Full                                                        +---------+---------------+---------+-----------+----------+--------------+ FV Prox  Full           Yes      Yes                                 +---------+---------------+---------+-----------+----------+--------------+ FV Mid   Full           Yes      Yes                                 +---------+---------------+---------+-----------+----------+--------------+ FV DistalFull           Yes      Yes                                 +---------+---------------+---------+-----------+----------+--------------+ PFV      Full                                                        +---------+---------------+---------+-----------+----------+--------------+ POP      Full           Yes      Yes                                 +---------+---------------+---------+-----------+----------+--------------+ PTV      Full                                                        +---------+---------------+---------+-----------+----------+--------------+ PERO     None           No  No                   Acute           +---------+---------------+---------+-----------+----------+--------------+    Summary: BILATERAL: -No evidence of popliteal cyst, bilaterally. RIGHT: - Findings consistent with acute deep vein thrombosis involving the right peroneal veins, and right posterior tibial veins.  LEFT: - Findings consistent with acute deep vein thrombosis involving the left peroneal veins.  *See table(s) above for measurements and observations.    Preliminary    [place] EKG SITE RITE  Result Date: 09/25/2022 If Site Rite image not attached, placement could not be confirmed due to current cardiac rhythm.  DG Chest 1 View  Result Date: 09/24/2022 CLINICAL DATA:  Check endotracheal tube placement EXAM: PORTABLE CHEST 1 VIEW COMPARISON:  Film from earlier in the same day. FINDINGS: Gastric catheter is noted within the stomach. Endotracheal tube is noted in satisfactory position. Right chest wall port is seen and stable. Lungs are well aerated bilaterally. Minimal right effusion is seen. No acute bony abnormality is noted. IMPRESSION: Minimal right-sided effusion. Electronically Signed   By: Alcide Clever M.D.   On: 09/24/2022 20:25   DG CHEST PORT 1 VIEW  Result Date: 09/24/2022 CLINICAL DATA:  Evaluate gastric catheter placement EXAM: PORTABLE CHEST 1 VIEW COMPARISON:  09/18/2022 FINDINGS: Cardiac shadow is stable but accentuated by the portable technique. The overall inspiratory effort is poor with right basilar atelectasis. Right chest wall port is seen in satisfactory position. No pneumothorax is noted. Gastric catheter is noted with the tip superimposed over the left mainstem bronchus. This may still lie within the mid esophagus. This should be withdrawn and readvanced. IMPRESSION: Gastric catheter as described. This should be withdrawn and readvanced into the stomach. Electronically Signed   By: Alcide Clever M.D.   On: 09/24/2022 15:09   DG Abd Portable 1V  Result Date: 09/24/2022 CLINICAL DATA:  Check gastric catheter  placement EXAM: PORTABLE ABDOMEN - 1 VIEW COMPARISON:  Film from earlier in the same day. FINDINGS: Scattered large and small bowel gas is noted. Retained fecal material within the colon is seen. No free air is noted. Gastric catheter is not visualized. Chest x-ray may be helpful for further evaluation. IMPRESSION: Gastric catheter is not visualized on this exam. Electronically Signed   By: Alcide Clever M.D.   On: 09/24/2022 15:07   DG Abd 1 View  Result Date: 09/24/2022 CLINICAL DATA:  Nasogastric placement EXAM: ABDOMEN - 1 VIEW COMPARISON:  Earlier same day FINDINGS: Nasogastric tube enters the stomach and coils in the fundus. Less intestinal gas than was noted on the prior image. Lucency in the right upper quadrant was not seen on the previous images. Free intraperitoneal air is not excluded. If there is clinical concern regarding this possibility, either left-side-down decubitus imaging or CT abdomen would be suggested. IMPRESSION: 1. Nasogastric tube enters the stomach and coils in the fundus. 2. Less intestinal gas than was noted on the prior image. 3. Lucency in the right upper quadrant was not seen on the previous images. Free intraperitoneal air is not excluded. See above discussion. Electronically Signed   By: Paulina Fusi M.D.   On: 09/24/2022 12:14   DG Abd Portable 1V  Result Date: 09/24/2022 CLINICAL DATA:  Abdominal pain. EXAM: PORTABLE ABDOMEN - 1 VIEW COMPARISON:  None Available. FINDINGS: Mildly dilated air-filled small bowel loops are noted concerning for ileus or possibly distal small bowel obstruction. No colonic dilatation is noted. No  abnormal calcifications are noted. IMPRESSION: Mildly dilated air-filled small bowel loops are noted concerning for ileus or possibly distal small bowel obstruction. Electronically Signed   By: Lupita Raider M.D.   On: 09/24/2022 09:05    Labs:  CBC: Recent Labs    09/23/22 0515 09/24/22 0127 09/24/22 1825 09/25/22 0439  WBC 12.6* 19.9*  42.6* 36.9*  HGB 10.2* 12.1* 11.4* 11.5*  HCT 33.2* 39.0 37.5* 36.9*  PLT 306 490* 472* 439*    COAGS: Recent Labs    09/19/22 1713  INR 1.1  APTT 29    BMP: Recent Labs    09/24/22 0127 09/24/22 1335 09/24/22 1825 09/25/22 0439  NA 141 140 140 139  K 4.2 4.1 4.5 4.2  CL 105 103 105 107  CO2 26 24 23 22   GLUCOSE 118* 112* 115* 148*  BUN 22 28* 29* 29*  CALCIUM 8.8* 8.4* 7.8* 7.8*  CREATININE 0.74 1.27* 1.53* 1.34*  GFRNONAA >60 >60 50* 59*    LIVER FUNCTION TESTS: Recent Labs    09/22/22 0554 09/23/22 0515 09/24/22 0127 09/25/22 0439  BILITOT 0.6 0.6 0.5 1.1  AST 25 29 33 29  ALT 13 17 23 18   ALKPHOS 54 52 69 44  PROT 6.0* 5.9* 6.6 5.1*  ALBUMIN 2.8* 2.5* 3.1* 2.6*    Assessment and Plan: 67 y.o. male with PMH HLD, arthritis, OSA and newly diagnosed DLBCL who was admitted to [facility] from [facility] on 5/23 with persistent abd pain, fatigue, nausea, neck pain, lightheadedness, night sweats, weight loss, poor oral intake. He is s/p expl lap with rt hemicolectomy and end ileostomy on 5/28 for perforation of rt colon into RP due to extensive B cell lymphoma involvement. LE venous doppler study today revealed:  RIGHT:  - Findings consistent with acute deep vein thrombosis involving the right  peroneal veins, and right posterior tibial veins.    LEFT:  - Findings consistent with acute deep vein thrombosis involving the left  peroneal veins   He is extubated, afebrile, BP soft (on phenylephrine), WBC 36.9, hgb 11.5, plts 439k, creat 1.34, PT/INR nl. Blood cx neg. He does have lower abd discomfort. NG tube in place. Due to very high risk for hemorrhagic transformation of lymphoma /known acute DVT request now received from CCM for IVC filter placement. Imaging/case has been reviewed by Dr. Elby Showers. Risks and benefits discussed with the patient/spouse  including, but not limited to bleeding, infection, contrast induced renal failure, filter fracture or migration which can  lead to emergency surgery or even death, strut penetration with damage or irritation to adjacent structures and caval thrombosis.  All of the patient's questions were answered, patient is agreeable to proceed. Consent signed and in chart. Procedure scheduled for today.     Electronically Signed: D. Jeananne Rama, PA-C 09/25/2022, 12:31 PM   I spent a total of 25 minutes at the the patient's bedside AND on the patient's hospital floor or unit, greater than 50% of which was counseling/coordinating care for IVC (inferior vena cava) filter placement  Patient ID: Justin Cameron, male   DOB: [birth year], 67 y.o.   MRN: [id number]

## 2022-09-25 NOTE — Progress Notes (Signed)
09/25/22 0433  Vent Select  Invasive or Noninvasive Invasive  Adult Vent Y  Airway 7.5 mm  Placement Date/Time: 09/24/22 (c) 1513   Grade View: Grade 1  Airway Device: Endotracheal Tube  Laryngoscope Blade: Miller;3  ETT Types: Oral  Size (mm): 7.5 mm  Cuffed: Cuffed;Min.occ.pres.  Insertion attempts: 1  Airway Equipment: Stylet  Placement ...  Secured at (cm) 24 cm  Measured From Lips  Secured Location Center  Secured By Commercial Tube Holder  Tube Holder Repositioned Yes  Site Condition Dry  Adult Ventilator Settings  Vent Type Servo i  Humidity HME  Vent Mode PRVC  Vt Set 540 mL  Set Rate 24 bmp  FiO2 (%) 50 %  I Time 0.9 Sec(s)  I:E Ratio Set 1:1.8  PEEP 5 cmH20  Adult Ventilator Measurements  Peak Airway Pressure 21 L/min  Mean Airway Pressure 11 cmH20  Plateau Pressure 14 cmH20  Resp Rate Spontaneous 0 br/min  Resp Rate Total 24 br/min  Exhaled Vt 543 mL  Measured Ve 12.4 L  I:E Ratio Measured 1:1.8  Adult Ventilator Alarms  Alarms On Y  Ve High Alarm 20 L/min  Ve Low Alarm 5 L/min  Resp Rate High Alarm 38 br/min  Resp Rate Low Alarm 10  PEEP Low Alarm 3 cmH2O  Press High Alarm 45 cmH2O  T Apnea 20 sec(s)  VAP Prevention  HOB> 30 Degrees Y  Daily Weaning Assessment  Daily Assessment of Readiness to Wean Wean protocol criteria not met  Reason not met Fi02 > 40%  Breath Sounds  Bilateral Breath Sounds Clear  R Upper  Breath Sounds Clear  L Upper Breath Sounds Clear  Vent Respiratory Assessment  Respiratory Pattern Regular;Unlabored;Symmetrical  Suction Method  Respiratory Interventions Airway suction  Airway Suctioning/Secretions  Suction Type ETT  Suction Device  Catheter  Secretion Amount None  Suction Tolerance Tolerated well  Suctioning Adverse Effects None     09/25/22 0433  Vent Select  Invasive or Noninvasive Invasive  Adult Vent Y  Airway 7.5 mm  Placement Date/Time: 09/24/22 (c) 1513   Grade View: Grade 1  Airway Device: Endotracheal  Tube  Laryngoscope Blade: Miller;3  ETT Types: Oral  Size (mm): 7.5 mm  Cuffed: Cuffed;Min.occ.pres.  Insertion attempts: 1  Airway Equipment: Stylet  Placement ...  Secured at (cm) 24 cm  Measured From [employer]  Secured By Commercial Tube Holder  Tube Holder Repositioned Yes  Site Condition Dry  Adult Ventilator Settings  Vent Type Servo i  Humidity HME  Vent Mode PRVC  Vt Set 540 mL  Set Rate 24 bmp  FiO2 (%) 50 %  I Time 0.9 Sec(s)  I:E Ratio Set 1:1.8  PEEP 5 cmH20  Adult Ventilator Measurements  Peak Airway Pressure 21 L/min  Mean Airway Pressure 11 cmH20  Plateau Pressure 14 cmH20  Resp Rate Spontaneous 0 br/min  Resp Rate Total 24 br/min  Exhaled Vt 543 mL  Measured Ve 12.4 L  I:E Ratio Measured 1:1.8  Adult Ventilator Alarms  Alarms On Y  Ve High Alarm 20 L/min  Ve Low Alarm 5 L/min  Resp Rate High Alarm 38 br/min  Resp Rate Low Alarm 10  PEEP Low Alarm 3 cmH2O  Press High Alarm 45 cmH2O  T Apnea 20 sec(s)  VAP Prevention  HOB> 30 Degrees Y  Daily Weaning Assessment  Daily Assessment of Readiness to Wean Wean protocol criteria not met  Reason not met Fi02 > 40%  Breath Sounds  Bilateral Breath Sounds Clear  R Upper  Breath Sounds Clear  L Upper Breath Sounds Clear  Vent Respiratory Assessment  Respiratory Pattern Regular;Unlabored;Symmetrical  Suction Method  Respiratory Interventions Airway suction  Airway Suctioning/Secretions  Suction Type ETT  Suction Device  Catheter  Secretion Amount None  Suction Tolerance Tolerated well  Suctioning Adverse Effects None

## 2022-09-25 NOTE — Progress Notes (Signed)
Echocardiogram 2D Echocardiogram has been performed.  Andee Chivers Wynn Banker 09/25/2022, 11:43 AM

## 2022-09-26 ENCOUNTER — Other Ambulatory Visit: Payer: Self-pay

## 2022-09-26 DIAGNOSIS — C8330 Diffuse large B-cell lymphoma, unspecified site: Secondary | ICD-10-CM | POA: Diagnosis not present

## 2022-09-26 LAB — TYPE AND SCREEN

## 2022-09-26 LAB — CBC
HCT: 22.5 % — ABNORMAL LOW (ref 39.0–52.0)
HCT: 22.6 % — ABNORMAL LOW (ref 39.0–52.0)
Hemoglobin: 6.9 g/dL — CL (ref 13.0–17.0)
Hemoglobin: 7.2 g/dL — ABNORMAL LOW (ref 13.0–17.0)
MCH: 25.1 pg — ABNORMAL LOW (ref 26.0–34.0)
MCH: 26.2 pg (ref 26.0–34.0)
MCHC: 30.7 g/dL (ref 30.0–36.0)
MCHC: 31.9 g/dL (ref 30.0–36.0)
MCV: 81.8 fL (ref 80.0–100.0)
MCV: 82.2 fL (ref 80.0–100.0)
Platelets: 274 10*3/uL (ref 150–400)
Platelets: 199 10*3/uL (ref 150–400)
RBC: 2.75 MIL/uL — ABNORMAL LOW (ref 4.22–5.81)
RBC: 2.75 MIL/uL — ABNORMAL LOW (ref 4.22–5.81)
RDW: 17.2 % — ABNORMAL HIGH (ref 11.5–15.5)
RDW: 16.8 % — ABNORMAL HIGH (ref 11.5–15.5)
WBC: 14.2 10*3/uL — ABNORMAL HIGH (ref 4.0–10.5)
WBC: 10.4 10*3/uL (ref 4.0–10.5)
nRBC: 0 % (ref 0.0–0.2)
nRBC: 0 % (ref 0.0–0.2)

## 2022-09-26 LAB — COMPREHENSIVE METABOLIC PANEL
ALT: 17 U/L (ref 0–44)
AST: 26 U/L (ref 15–41)
Albumin: 2 g/dL — ABNORMAL LOW (ref 3.5–5.0)
Alkaline Phosphatase: 47 U/L (ref 38–126)
Anion gap: 7 (ref 5–15)
BUN: 31 mg/dL — ABNORMAL HIGH (ref 8–23)
CO2: 24 mmol/L (ref 22–32)
Calcium: 7.4 mg/dL — ABNORMAL LOW (ref 8.9–10.3)
Chloride: 106 mmol/L (ref 98–111)
Creatinine, Ser: 0.96 mg/dL (ref 0.61–1.24)
GFR, Estimated: >60 mL/min (ref >60)
Glucose, Bld: 122 mg/dL — ABNORMAL HIGH (ref 70–99)
Potassium: 3.8 mmol/L (ref 3.5–5.1)
Sodium: 137 mmol/L (ref 135–145)
Total Bilirubin: 0.9 mg/dL (ref 0.3–1.2)
Total Protein: 4.6 g/dL — ABNORMAL LOW (ref 6.5–8.1)

## 2022-09-26 LAB — PHOSPHORUS: Phosphorus: 2.6 mg/dL (ref 2.5–4.6)

## 2022-09-26 LAB — CBC WITH DIFFERENTIAL/PLATELET
Abs Immature Granulocytes: 0.25 10*3/uL — ABNORMAL HIGH (ref 0.00–0.07)
Basophils Absolute: 0 10*3/uL (ref 0.0–0.1)
Basophils Relative: 0 %
Eosinophils Absolute: 0.1 10*3/uL (ref 0.0–0.5)
Eosinophils Relative: 1 %
HCT: 23.4 % — ABNORMAL LOW (ref 39.0–52.0)
Hemoglobin: 7.1 g/dL — ABNORMAL LOW (ref 13.0–17.0)
Immature Granulocytes: 2 %
Lymphocytes Relative: 3 %
Lymphs Abs: 0.4 10*3/uL — ABNORMAL LOW (ref 0.7–4.0)
MCH: 25 pg — ABNORMAL LOW (ref 26.0–34.0)
MCHC: 30.3 g/dL (ref 30.0–36.0)
MCV: 82.4 fL (ref 80.0–100.0)
Monocytes Absolute: 0.7 10*3/uL (ref 0.1–1.0)
Monocytes Relative: 5 %
Neutro Abs: 13.7 10*3/uL — ABNORMAL HIGH (ref 1.7–7.7)
Neutrophils Relative %: 89 %
Platelets: 328 10*3/uL (ref 150–400)
RBC: 2.84 MIL/uL — ABNORMAL LOW (ref 4.22–5.81)
RDW: 17 % — ABNORMAL HIGH (ref 11.5–15.5)
WBC: 15.2 10*3/uL — ABNORMAL HIGH (ref 4.0–10.5)
nRBC: 0 % (ref 0.0–0.2)

## 2022-09-26 LAB — BASIC METABOLIC PANEL
Anion gap: 8 (ref 5–15)
BUN: 23 mg/dL (ref 8–23)
CO2: 25 mmol/L (ref 22–32)
Calcium: 7.9 mg/dL — ABNORMAL LOW (ref 8.9–10.3)
Chloride: 107 mmol/L (ref 98–111)
Creatinine, Ser: 0.85 mg/dL (ref 0.61–1.24)
GFR, Estimated: >60 mL/min (ref >60)
Glucose, Bld: 111 mg/dL — ABNORMAL HIGH (ref 70–99)
Potassium: 3.5 mmol/L (ref 3.5–5.1)
Sodium: 140 mmol/L (ref 135–145)

## 2022-09-26 LAB — GLUCOSE, CAPILLARY
Glucose-Capillary: 120 mg/dL — ABNORMAL HIGH (ref 70–99)
Glucose-Capillary: 104 mg/dL — ABNORMAL HIGH (ref 70–99)
Glucose-Capillary: 106 mg/dL — ABNORMAL HIGH (ref 70–99)

## 2022-09-26 LAB — PREPARE RBC (CROSSMATCH)

## 2022-09-26 LAB — URIC ACID: Uric Acid, Serum: 4.1 mg/dL (ref 3.7–8.6)

## 2022-09-26 LAB — MAGNESIUM
Magnesium: 2.4 mg/dL (ref 1.7–2.4)
Magnesium: 2.3 mg/dL (ref 1.7–2.4)

## 2022-09-26 LAB — HEMOGLOBIN A1C
Hgb A1c MFr Bld: 5.9 % — ABNORMAL HIGH (ref 4.8–5.6)
Mean Plasma Glucose: 123 mg/dL

## 2022-09-26 LAB — TROPONIN I (HIGH SENSITIVITY): Troponin I (High Sensitivity): 44 ng/L — ABNORMAL HIGH (ref <18)

## 2022-09-26 MED ORDER — DEXMEDETOMIDINE HCL IN NACL 200 MCG/50ML IV SOLN: 0.0000 ug/kg/h | INTRAVENOUS | Status: DC

## 2022-09-26 MED ORDER — THIAMINE HCL 100 MG/ML IJ SOLN
100.0000 mg | Freq: Every day | INTRAMUSCULAR | Status: AC
  Administered 2022-09-26 – 2022-09-30 (×5): 100 mg via INTRAVENOUS
  Filled 2022-09-26 (×5): qty 2

## 2022-09-26 MED ORDER — ORAL CARE MOUTH RINSE: 15.0000 mL | OROMUCOSAL | Status: DC | PRN

## 2022-09-26 MED ORDER — AMIODARONE HCL IN DEXTROSE 360-4.14 MG/200ML-% IV SOLN
30.0000 mg/h | INTRAVENOUS | Status: DC
  Administered 2022-09-27 – 2022-10-02 (×12): 30 mg/h via INTRAVENOUS
  Filled 2022-09-26 (×13): qty 200

## 2022-09-26 MED ORDER — METOPROLOL TARTRATE 5 MG/5ML IV SOLN
INTRAVENOUS | Status: AC
  Administered 2022-09-27: 2.5 mg via INTRAVENOUS
  Filled 2022-09-26: qty 5

## 2022-09-26 MED ORDER — POTASSIUM CHLORIDE 10 MEQ/50ML IV SOLN
10.0000 meq | INTRAVENOUS | Status: AC
  Administered 2022-09-26 (×2): 10 meq via INTRAVENOUS
  Filled 2022-09-26 (×2): qty 50

## 2022-09-26 MED ORDER — SODIUM CHLORIDE 0.9% IV SOLUTION: Freq: Once | INTRAVENOUS | Status: AC

## 2022-09-26 MED ORDER — METOPROLOL TARTRATE 5 MG/5ML IV SOLN: 5.0000 mg | Freq: Once | INTRAVENOUS | Status: AC

## 2022-09-26 MED ORDER — ORAL CARE MOUTH RINSE
15.0000 mL | OROMUCOSAL | Status: DC | PRN
  Administered 2022-09-27: 15 mL via OROMUCOSAL

## 2022-09-26 MED ORDER — AMIODARONE HCL IN DEXTROSE 360-4.14 MG/200ML-% IV SOLN
60.0000 mg/h | INTRAVENOUS | Status: AC
  Administered 2022-09-26 – 2022-09-27 (×2): 60 mg/h via INTRAVENOUS
  Filled 2022-09-26 (×2): qty 200

## 2022-09-26 MED ORDER — ACETAMINOPHEN 650 MG RE SUPP
650.0000 mg | RECTAL | Status: AC | PRN
  Administered 2022-09-27: 650 mg via RECTAL
  Filled 2022-09-26 (×2): qty 1

## 2022-09-26 MED ORDER — TRAVASOL 10 % IV SOLN
INTRAVENOUS | Status: AC
  Filled 2022-09-26: qty 763.2

## 2022-09-26 MED ORDER — INSULIN ASPART 100 UNIT/ML IJ SOLN
0.0000 [IU] | Freq: Four times a day (QID) | INTRAMUSCULAR | Status: DC
  Administered 2022-09-27 – 2022-09-30 (×3): 1 [IU] via SUBCUTANEOUS

## 2022-09-26 MED ORDER — LACTATED RINGERS IV SOLN: INTRAVENOUS | Status: AC

## 2022-09-26 MED ORDER — SODIUM CHLORIDE 0.9% IV SOLUTION: Freq: Once | INTRAVENOUS | Status: DC

## 2022-09-26 MED ORDER — ALBUMIN HUMAN 25 % IV SOLN
25.0000 g | Freq: Four times a day (QID) | INTRAVENOUS | Status: AC
  Administered 2022-09-26 – 2022-09-27 (×4): 25 g via INTRAVENOUS
  Filled 2022-09-26 (×3): qty 100

## 2022-09-26 NOTE — Progress Notes (Signed)
HEMATOLOGY/ONCOLOGY INPATIENT PROGRESS NOTE  Date of Service: 09/25/2022  Inpatient Attending: .Lorin Glass, MD   SUBJECTIVE Patient was seen with his wife at bedside.  He is status post right colectomy for perforated right sided colon on his lymphoma involvement.  He was extubated this morning and is still somewhat groggy.  Wife at bedside. Has been started on TPN. Also had IVC filter placed.  OBJECTIVE:  NAD  PHYSICAL EXAMINATION: . Vitals:   09/26/22 1124 09/26/22 1130 09/26/22 1137 09/26/22 1145  BP:  131/61  (!) 106/52  Pulse: (!) 107 (!) 106  (!) 107  Resp: 20 (!) 23  18  Temp:   98.4 F (36.9 C)   TempSrc:   Oral   SpO2: 92% 94%  94%  Weight:      Height:       GENERAL: Sleepy but easily arousable. wife at bedside OROPHARYNX: NG tube in situ NECK: supple, no JVD LYMPH:  no palpable lymphadenopathy in the cervical, axillary or inguinal regions LUNGS: clear to auscultation b/l with normal respiratory effort HEART: regular rate & rhythm, mild tachy ABDOMEN: hypoactive bowel sounds Extremity: trace b/l pedal edema PSYCH: sleepy but easily arounsable NEURO: moving all 4 extremities   MEDICAL HISTORY:  Past Medical History:  Diagnosis Date   Arthritis    High cholesterol     SURGICAL HISTORY: Past Surgical History:  Procedure Laterality Date   IR IMAGING GUIDED PORT INSERTION  09/20/2022   IR IVC FILTER PLMT / S&I /IMG GUID/MOD SED  09/25/2022   LAPAROTOMY N/A 09/24/2022   Procedure: EXPLORATORY LAPAROTOMY, BOWEL RESECTION, ILEOSTOMY;  Surgeon: Harriette Bouillon, MD;  Location: [facility];  Service: General;  Laterality: N/A;   REPLACEMENT TOTAL KNEE  10/2018   ROTATOR CUFF REPAIR      SOCIAL HISTORY: Social History   Socioeconomic History   Marital status: Married    Spouse name: Not on file   Number of children: Not on file   Years of education: Not on file   Highest education level: Not on file  Occupational History   Not on file  Tobacco Use    Smoking status: Never   Smokeless tobacco: Never  Vaping Use   Vaping Use: Never used  Substance and Sexual Activity   Alcohol use: Not Currently   Drug use: Not Currently   Sexual activity: Not on file  Other Topics Concern   Not on file  Social History Narrative   Not on file   Social Determinants of Health   Financial Resource Strain: Not on file  Food Insecurity: No Food Insecurity (09/23/2022)   Hunger Vital Sign    Worried About Running Out of Food in the Last Year: Never true    Ran Out of Food in the Last Year: Never true  Transportation Needs: No Transportation Needs (09/23/2022)   PRAPARE - Administrator, Civil Service (Medical): No    Lack of Transportation (Non-Medical): No  Physical Activity: Not on file  Stress: Not on file  Social Connections: Not on file  Intimate Partner Violence: Not At Risk (09/23/2022)   Humiliation, Afraid, Rape, and Kick questionnaire    Fear of Current or Ex-Partner: No    Emotionally Abused: No    Physically Abused: No    Sexually Abused: No    FAMILY HISTORY: Family History  Problem Relation Age of Onset   Atrial fibrillation Mother    Alzheimer's disease Mother    Leukemia Father  Diabetes Father    Colon cancer Neg Hx    Rectal cancer Neg Hx    Esophageal cancer Neg Hx    Stomach cancer Neg Hx     ALLERGIES:  has no allergies on file.  MEDICATIONS:  Scheduled Meds:  Chlorhexidine Gluconate Cloth  6 each Topical Daily   insulin aspart  0-9 Units Subcutaneous Q6H   pantoprazole (PROTONIX) IV  40 mg Intravenous Q24H   sodium chloride flush  10-40 mL Intracatheter Q12H   thiamine (VITAMIN B1) injection  100 mg Intravenous Daily   Continuous Infusions:  albumin human Stopped (09/26/22 0929)   lactated ringers 60 mL/hr at 09/26/22 1143   lactated ringers     phenylephrine (NEO-SYNEPHRINE) Adult infusion 20 mcg/min (09/26/22 1143)   piperacillin-tazobactam (ZOSYN)  IV Stopped (09/26/22 0906)   TPN ADULT  (ION) 40 mL/hr at 09/26/22 1143   TPN ADULT (ION)     PRN Meds:.diphenhydrAMINE, HYDROmorphone (DILAUDID) injection, LORazepam, mouth rinse, prochlorperazine, sodium chloride flush  REVIEW OF SYSTEMS:   .10 Point review of Systems was done is negative except as noted above.  LABORATORY DATA:  I have reviewed the data as listed .    Latest Ref Rng & Units 09/26/2022    8:26 AM 09/26/2022    5:14 AM 09/25/2022    4:39 AM  CBC  WBC 4.0 - 10.5 K/uL 14.2  15.2  36.9   Hemoglobin 13.0 - 17.0 g/dL 6.9  7.1  81.1   Hematocrit 39.0 - 52.0 % 22.5  23.4  36.9   Platelets 150 - 400 K/uL 274  328  439    .    Latest Ref Rng & Units 09/26/2022    4:07 AM 09/25/2022    4:39 AM 09/24/2022    6:25 PM  CMP  Glucose 70 - 99 mg/dL 914  782  956   BUN 8 - 23 mg/dL 31  29  29    Creatinine 0.61 - 1.24 mg/dL 2.13  0.86  5.78   Sodium 135 - 145 mmol/L 137  139  140   Potassium 3.5 - 5.1 mmol/L 3.8  4.2  4.5   Chloride 98 - 111 mmol/L 106  107  105   CO2 22 - 32 mmol/L 24  22  23    Calcium 8.9 - 10.3 mg/dL 7.4  7.8  7.8   Total Protein 6.5 - 8.1 g/dL 4.6  5.1    Total Bilirubin 0.3 - 1.2 mg/dL 0.9  1.1    Alkaline Phos 38 - 126 U/L 47  44    AST 15 - 41 U/L 26  29    ALT 0 - 44 U/L 17  18     Component     Latest Ref Rng 09/19/2022 09/20/2022 09/21/2022  HCV Ab     NON REACTIVE  NON REACTIVE     Hepatitis B Surface Ag     NON REACTIVE  NON REACTIVE     Hep B Core Total Ab     NON REACTIVE  NON REACTIVE     Procalcitonin     ng/mL 0.22     Cortisol, Plasma     ug/dL 46.9     HIV Screen 4th Generation wRfx     Non Reactive   Non Reactive    Uric Acid, Serum     3.7 - 8.6 mg/dL   5.4     I have personally reviewed the radiological images as listed and agreed with the findings  in the report. VAS [place] LOWER EXTREMITY VENOUS (DVT)  Result Date: 09/25/2022  Lower Venous DVT Study Patient Name:  PLES FRAGER  Date of Exam:   09/25/2022 Medical Rec #: [id number]            Accession #:     [id number] Date of Birth: [birth year]             Patient Gender: M Patient Age:   67 years Exam Location:  [facility] Procedure:      VAS [place] LOWER EXTREMITY VENOUS (DVT) Referring Phys: Hosie Spangle --------------------------------------------------------------------------------  Indications: Edema.  Risk Factors: Chemotherapy Cancer Lymphoma. Comparison Study: No previous exams Performing Technologist: Jody Hill RVT, RDMS  Examination Guidelines: A complete evaluation includes B-mode imaging, spectral Doppler, color Doppler, and power Doppler as needed of all accessible portions of each vessel. Bilateral testing is considered an integral part of a complete examination. Limited examinations for reoccurring indications may be performed as noted. The reflux portion of the exam is performed with the patient in reverse Trendelenburg.  +---------+---------------+---------+-----------+----------+--------------+ RIGHT    CompressibilityPhasicitySpontaneityPropertiesThrombus Aging +---------+---------------+---------+-----------+----------+--------------+ CFV      Full           Yes      Yes                                 +---------+---------------+---------+-----------+----------+--------------+ SFJ      Full                                                        +---------+---------------+---------+-----------+----------+--------------+ FV Prox  Full           Yes      Yes                                 +---------+---------------+---------+-----------+----------+--------------+ FV Mid   Full           Yes      Yes                                 +---------+---------------+---------+-----------+----------+--------------+ FV DistalFull           Yes      Yes                                 +---------+---------------+---------+-----------+----------+--------------+ PFV      Full                                                         +---------+---------------+---------+-----------+----------+--------------+ POP      Full           Yes      Yes                                 +---------+---------------+---------+-----------+----------+--------------+ PTV      None  No       No                   Acute          +---------+---------------+---------+-----------+----------+--------------+ PERO     None           No       No                   Acute          +---------+---------------+---------+-----------+----------+--------------+   +---------+---------------+---------+-----------+----------+--------------+ LEFT     CompressibilityPhasicitySpontaneityPropertiesThrombus Aging +---------+---------------+---------+-----------+----------+--------------+ CFV      Full           Yes      Yes                                 +---------+---------------+---------+-----------+----------+--------------+ SFJ      Full                                                        +---------+---------------+---------+-----------+----------+--------------+ FV Prox  Full           Yes      Yes                                 +---------+---------------+---------+-----------+----------+--------------+ FV Mid   Full           Yes      Yes                                 +---------+---------------+---------+-----------+----------+--------------+ FV DistalFull           Yes      Yes                                 +---------+---------------+---------+-----------+----------+--------------+ PFV      Full                                                        +---------+---------------+---------+-----------+----------+--------------+ POP      Full           Yes      Yes                                 +---------+---------------+---------+-----------+----------+--------------+ PTV      Full                                                         +---------+---------------+---------+-----------+----------+--------------+ PERO     None           No       No  Acute          +---------+---------------+---------+-----------+----------+--------------+     Summary: BILATERAL: -No evidence of popliteal cyst, bilaterally. RIGHT: - Findings consistent with acute deep vein thrombosis involving the right peroneal veins, and right posterior tibial veins.  LEFT: - Findings consistent with acute deep vein thrombosis involving the left peroneal veins.  *See table(s) above for measurements and observations. Electronically signed by Coral Else MD on 09/25/2022 at 5:07:37 PM.    Final    IR IVC FILTER PLMT / S&I Lenise Arena GUID/MOD SED  Result Date: 09/25/2022 CLINICAL DATA:  67 year old male with history of diffuse large B-cell lymphoma and lower extremity deep vein thrombosis with contraindication to anticoagulation due to high risk of lymphoma hemorrhagic transformation. EXAM: 1. ULTRASOUND GUIDANCE FOR VASCULAR ACCESS OF THE RIGHT internal jugular VEIN. 2. IVC VENOGRAM. 3. PERCUTANEOUS IVC FILTER PLACEMENT. ANESTHESIA/SEDATION: Moderate (conscious) sedation was employed during this procedure. A total of Versed 1 mg and Fentanyl 50 mcg was administered intravenously. Moderate Sedation Time: 12 minutes. The patient's level of consciousness and vital signs were monitored continuously by radiology nursing throughout the procedure under my direct supervision. CONTRAST:  30mL OMNIPAQUE IOHEXOL 300 MG/ML SOLN, 7mL OMNIPAQUE IOHEXOL 300 MG/ML SOLN FLUOROSCOPY TIME:  One hundred five mGy. PROCEDURE: The procedure, risks, benefits, and alternatives were explained to the patient. Questions regarding the procedure were encouraged and answered. The patient understands and consents to the procedure. The patient was prepped with Betadine in a sterile fashion, and a sterile drape was applied covering the operative field. A sterile gown and sterile gloves were used  for the procedure. Local anesthesia was provided with 1% Lidocaine. Under direct ultrasound guidance, a 21 gauge needle was advanced into the right internal jugular vein with ultrasound image documentation performed. After securing access with a micropuncture dilator, a guidewire was advanced into the inferior vena cava. A deployment sheath was advanced over the guidewire. This was utilized to perform IVC venography. The deployment sheath was further positioned in an appropriate location for filter deployment. A Denali IVC filter was then advanced in the sheath. This was then fully deployed in the infrarenal IVC. Final filter position was confirmed with a fluoroscopic spot image. Contrast injection was also performed through the sheath under fluoroscopy to confirm patency of the IVC at the level of the filter. After the procedure the sheath was removed and hemostasis obtained with manual compression. COMPLICATIONS: None. FINDINGS: IVC venography demonstrates a normal caliber IVC with no evidence of thrombus. Renal veins are identified bilaterally. The IVC filter was successfully positioned below the level of the renal veins and is appropriately oriented. This IVC filter has both permanent and retrievable indications. IMPRESSION: Placement of percutaneous IVC filter in infrarenal IVC. IVC venogram shows no evidence of IVC thrombus and normal caliber of the inferior vena cava. This filter does have both permanent and retrievable indications. PLAN: This IVC filter is potentially retrievable. The patient will be assessed for filter retrieval by Interventional Radiology in approximately 8-12 weeks. Further recommendations regarding filter retrieval, continued surveillance or declaration of device permanence, will be made at that time. Marliss Coots, MD [facility] Radiology Electronically Signed   By: Marliss Coots M.D.   On: 09/25/2022 15:20   ECHOCARDIOGRAM  LIMITED  Result Date: 09/25/2022    ECHOCARDIOGRAM LIMITED REPORT   Patient Name:   ERINN MOONEN Date of Exam: 09/25/2022 Medical Rec #:  [id number]           Height:  68.0 in Accession #:    [id number]          Weight:       178.8 lb Date of Birth:  1955/05/12            BSA:          1.949 m Patient Age:    66 years            BP:           94/56 mmHg Patient Gender: M                   HR:           100 bpm. Exam Location:  Inpatient Procedure: Limited Echo, Cardiac Doppler, Color Doppler and Intracardiac            Opacification Agent Indications:    Pulmonary embolis  History:        Patient has prior history of Echocardiogram examinations, most                 recent 09/20/2022.  Sonographer:    Lucy Antigua Referring Phys: [id number] Adam Phenix IMPRESSIONS  1. Left ventricular ejection fraction, by estimation, is 45 to 50%. The left ventricle has mildly decreased function. There is the interventricular septum is flattened in systole, consistent with right ventricular pressure overload.  2. Right ventricular systolic function is mildly reduced. The right ventricular size is mildly enlarged.  3. Mild mitral valve regurgitation.  4. The aortic valve is tricuspid. Aortic valve sclerosis is present, with no evidence of aortic valve stenosis. Comparison(s): Prior images reviewed side by side. Compared to recent prior study, LVEF is mildly decreased, with evidence of new RV strain. This study uses echo contrast. FINDINGS  Left Ventricle: Left ventricular ejection fraction, by estimation, is 45 to 50%. The left ventricle has mildly decreased function. Definity contrast agent was given IV to delineate the left ventricular endocardial borders. The interventricular septum is  flattened in systole, consistent with right ventricular pressure overload. Right Ventricle: The right ventricular size is mildly enlarged. Right ventricular systolic function is mildly reduced. Mitral Valve: Mild mitral valve  regurgitation. Tricuspid Valve: Tricuspid valve regurgitation is mild. Aortic Valve: The aortic valve is tricuspid. Aortic valve sclerosis is present, with no evidence of aortic valve stenosis. Pulmonic Valve: The pulmonic valve was normal in structure. Pulmonic valve regurgitation is trivial. No evidence of pulmonic stenosis. Additional Comments: Spectral Doppler performed. Color Doppler performed.   LV Volumes (MOD) LV vol d, MOD A2C: 77.5 ml LV vol d, MOD A4C: 70.7 ml LV vol s, MOD A2C: 44.8 ml LV vol s, MOD A4C: 37.7 ml LV SV MOD A2C:     32.7 ml LV SV MOD A4C:     70.7 ml LV SV MOD BP:      35.1 ml Riley Lam MD Electronically signed by Riley Lam MD Signature Date/Time: 09/25/2022/1:37:02 PM    Final    [place] EKG SITE RITE  Result Date: 09/25/2022 If Site Rite image not attached, placement could not be confirmed due to current cardiac rhythm.  DG Chest 1 View  Result Date: 09/24/2022 CLINICAL DATA:  Check endotracheal tube placement EXAM: PORTABLE CHEST 1 VIEW COMPARISON:  Film from earlier in the same day. FINDINGS: Gastric catheter is noted within the stomach. Endotracheal tube is noted in satisfactory position. Right chest wall port is seen and stable. Lungs are well aerated bilaterally. Minimal right effusion is seen. No acute bony  abnormality is noted. IMPRESSION: Minimal right-sided effusion. Electronically Signed   By: Alcide Clever M.D.   On: 09/24/2022 20:25   DG CHEST PORT 1 VIEW  Result Date: 09/24/2022 CLINICAL DATA:  Evaluate gastric catheter placement EXAM: PORTABLE CHEST 1 VIEW COMPARISON:  09/18/2022 FINDINGS: Cardiac shadow is stable but accentuated by the portable technique. The overall inspiratory effort is poor with right basilar atelectasis. Right chest wall port is seen in satisfactory position. No pneumothorax is noted. Gastric catheter is noted with the tip superimposed over the left mainstem bronchus. This may still lie within the mid esophagus. This should  be withdrawn and readvanced. IMPRESSION: Gastric catheter as described. This should be withdrawn and readvanced into the stomach. Electronically Signed   By: Alcide Clever M.D.   On: 09/24/2022 15:09   DG Abd Portable 1V  Result Date: 09/24/2022 CLINICAL DATA:  Check gastric catheter placement EXAM: PORTABLE ABDOMEN - 1 VIEW COMPARISON:  Film from earlier in the same day. FINDINGS: Scattered large and small bowel gas is noted. Retained fecal material within the colon is seen. No free air is noted. Gastric catheter is not visualized. Chest x-ray may be helpful for further evaluation. IMPRESSION: Gastric catheter is not visualized on this exam. Electronically Signed   By: Alcide Clever M.D.   On: 09/24/2022 15:07   DG Abd 1 View  Result Date: 09/24/2022 CLINICAL DATA:  Nasogastric placement EXAM: ABDOMEN - 1 VIEW COMPARISON:  Earlier same day FINDINGS: Nasogastric tube enters the stomach and coils in the fundus. Less intestinal gas than was noted on the prior image. Lucency in the right upper quadrant was not seen on the previous images. Free intraperitoneal air is not excluded. If there is clinical concern regarding this possibility, either left-side-down decubitus imaging or CT abdomen would be suggested. IMPRESSION: 1. Nasogastric tube enters the stomach and coils in the fundus. 2. Less intestinal gas than was noted on the prior image. 3. Lucency in the right upper quadrant was not seen on the previous images. Free intraperitoneal air is not excluded. See above discussion. Electronically Signed   By: Paulina Fusi M.D.   On: 09/24/2022 12:14   DG Abd Portable 1V  Result Date: 09/24/2022 CLINICAL DATA:  Abdominal pain. EXAM: PORTABLE ABDOMEN - 1 VIEW COMPARISON:  None Available. FINDINGS: Mildly dilated air-filled small bowel loops are noted concerning for ileus or possibly distal small bowel obstruction. No colonic dilatation is noted. No abnormal calcifications are noted. IMPRESSION: Mildly dilated  air-filled small bowel loops are noted concerning for ileus or possibly distal small bowel obstruction. Electronically Signed   By: Lupita Raider M.D.   On: 09/24/2022 09:05   IR IMAGING GUIDED PORT INSERTION  Result Date: 09/20/2022 INDICATION: Colonic mass.  New diagnosis of lymphoma. EXAM: IMPLANTED PORT A CATH PLACEMENT WITH ULTRASOUND AND FLUOROSCOPIC GUIDANCE MEDICATIONS: Ancef 2 gm IV; The antibiotic was administered within an appropriate time interval prior to skin puncture. ANESTHESIA/SEDATION: Moderate (conscious) sedation was employed during this procedure. A total of Versed 2 mg and Fentanyl 100 mcg was administered intravenously. Moderate Sedation Time: 20 minutes. The patient's level of consciousness and vital signs were monitored continuously by radiology nursing throughout the procedure under my direct supervision. FLUOROSCOPY TIME:  Fluoroscopic dose; 0 mGy COMPLICATIONS: None immediate. PROCEDURE: The procedure, risks, benefits, and alternatives were explained to the patient. Questions regarding the procedure were encouraged and answered. The patient understands and consents to the procedure. The RIGHT neck and chest were prepped with chlorhexidine  in a sterile fashion, and a sterile drape was applied covering the operative field. Maximum barrier sterile technique with sterile gowns and gloves were used for the procedure. A timeout was performed prior to the initiation of the procedure. Local anesthesia was provided with 1% lidocaine with epinephrine. After creating a small venotomy incision, a micropuncture kit was utilized to access the internal jugular vein under direct, real-time ultrasound guidance. Ultrasound image documentation was performed. The microwire was kinked to measure appropriate catheter length. A subcutaneous port pocket was then created along the upper chest wall utilizing a combination of sharp and blunt dissection. The pocket was irrigated with sterile saline. A single  lumen Non-ISP power injectable port was chosen for placement. The 8 Fr catheter was tunneled from the port pocket site to the venotomy incision. The port was placed in the pocket. The external catheter was trimmed to appropriate length. At the venotomy, an 8 Fr peel-away sheath was placed over a guidewire under fluoroscopic guidance. The catheter was then placed through the sheath and the sheath was removed. Final catheter positioning was confirmed and documented with a fluoroscopic spot radiograph. The port was accessed with a Huber needle, aspirated and flushed with heparinized saline. The port pocket incision was closed with interrupted 3-0 Vicryl suture then Dermabond was applied, including at the venotomy incision. Dressings were placed. The patient tolerated the procedure well without immediate post procedural complication. IMPRESSION: Successful placement of a RIGHT internal jugular approach power injectable Port-A-Cath. The tip of the catheter is positioned within the proximal RIGHT atrium. The catheter is ready for immediate use. Roanna Banning, MD [facility] Radiology Electronically Signed   By: Roanna Banning M.D.   On: 09/20/2022 14:44   ECHOCARDIOGRAM COMPLETE  Result Date: 09/20/2022    ECHOCARDIOGRAM REPORT   Patient Name:   GURKARAN SCHUCH Date of Exam: 09/20/2022 Medical Rec #:  [id number]           Height:       68.0 in Accession #:    [id number]          Weight:       177.9 lb Date of Birth:  10/25/1955            BSA:          1.945 m Patient Age:    66 years            BP:           100/63 mmHg Patient Gender: M                   HR:           80 bpm. Exam Location:  Inpatient Procedure: 2D Echo, Cardiac Doppler and Color Doppler Indications:    Chemo  History:        Patient has no prior history of Echocardiogram examinations.  Sonographer:    Lucy Antigua Referring Phys: [id number] [name]  Sonographer Comments: Strain was attempted.  Echo machine @ [facility] doesn't have strain Neurosurgeon. IMPRESSIONS  1. Left ventricular ejection fraction, by estimation, is 55%. The left ventricle has low normal function. The left ventricle has no regional wall motion abnormalities.  2. Right ventricular systolic function is normal. The right ventricular size is normal. There is normal pulmonary artery systolic pressure.  3. The mitral valve is normal in structure. Trivial mitral valve regurgitation. No evidence of mitral stenosis.  4. The aortic valve is normal in  structure. Aortic valve regurgitation is not visualized. No aortic stenosis is present.  5. The inferior vena cava is normal in size with greater than 50% respiratory variability, suggesting right atrial pressure of 3 mmHg. FINDINGS  Left Ventricle: Left ventricular ejection fraction, by estimation, is 50 to 55%. The left ventricle has low normal function. The left ventricle has no regional wall motion abnormalities. The left ventricular internal cavity size was normal in size. There is borderline left ventricular hypertrophy. Left ventricular diastolic parameters were normal. Right Ventricle: The right ventricular size is normal. No increase in right ventricular wall thickness. Right ventricular systolic function is normal. There is normal pulmonary artery systolic pressure. The tricuspid regurgitant velocity is 1.94 m/s, and  with an assumed right atrial pressure of 3 mmHg, the estimated right ventricular systolic pressure is 18.1 mmHg. Left Atrium: Left atrial size was normal in size. Right Atrium: Right atrial size was normal in size. Pericardium: There is no evidence of pericardial effusion. Mitral Valve: The mitral valve is normal in structure. Trivial mitral valve regurgitation. No evidence of mitral valve stenosis. Tricuspid Valve: The tricuspid valve is normal in structure. Tricuspid valve regurgitation is trivial. No evidence of tricuspid stenosis. Aortic Valve: The aortic valve is normal in  structure. Aortic valve regurgitation is not visualized. No aortic stenosis is present. Aortic valve mean gradient measures 2.0 mmHg. Aortic valve peak gradient measures 3.6 mmHg. Aortic valve area, by VTI measures 3.80 cm. Pulmonic Valve: The pulmonic valve was normal in structure. Pulmonic valve regurgitation is not visualized. No evidence of pulmonic stenosis. Aorta: The aortic root is normal in size and structure. Venous: The inferior vena cava is normal in size with greater than 50% respiratory variability, suggesting right atrial pressure of 3 mmHg. IAS/Shunts: No atrial level shunt detected by color flow Doppler.  LEFT VENTRICLE PLAX 2D LVIDd:         3.60 cm   Diastology LVIDs:         2.70 cm   LV e' medial:    10.00 cm/s LV PW:         1.20 cm   LV E/e' medial:  4.6 LV IVS:        1.00 cm   LV e' lateral:   9.36 cm/s LVOT diam:     2.20 cm   LV E/e' lateral: 4.9 LV SV:         74 LV SV Index:   38 LVOT Area:     3.80 cm  RIGHT VENTRICLE RV S prime:     10.90 cm/s TAPSE (M-mode): 1.5 cm LEFT ATRIUM             Index        RIGHT ATRIUM           Index LA Vol (A2C):   61.4 ml 31.58 ml/m  RA Area:     13.90 cm LA Vol (A4C):   58.4 ml 30.03 ml/m  RA Volume:   32.80 ml  16.87 ml/m LA Biplane Vol: 59.6 ml 30.65 ml/m  AORTIC VALVE AV Area (Vmax):    3.46 cm AV Area (Vmean):   3.17 cm AV Area (VTI):     3.80 cm AV Vmax:           94.70 cm/s AV Vmean:          64.800 cm/s AV VTI:            0.195 m AV Peak Grad:  3.6 mmHg AV Mean Grad:      2.0 mmHg LVOT Vmax:         86.20 cm/s LVOT Vmean:        54.000 cm/s LVOT VTI:          0.195 m LVOT/AV VTI ratio: 1.00  AORTA Ao Root diam: 2.90 cm Ao Asc diam:  3.60 cm MITRAL VALVE               TRICUSPID VALVE MV Area (PHT): 3.08 cm    TR Peak grad:   15.1 mmHg MV Decel Time: 246 msec    TR Vmax:        194.00 cm/s MR Peak grad: 32.5 mmHg MR Vmax:      285.00 cm/s  SHUNTS MV E velocity: 46.30 cm/s  Systemic VTI:  0.20 m MV A velocity: 61.30 cm/s  Systemic  Diam: 2.20 cm MV E/A ratio:  0.76 Aditya Sabharwal Electronically signed by Dorthula Nettles Signature Date/Time: 09/20/2022/1:15:18 PM    Final    CT ABDOMEN PELVIS W CONTRAST  Result Date: 09/18/2022 CLINICAL DATA:  Colon cancer, anorexia, weakness, fever EXAM: CT ABDOMEN AND PELVIS WITH CONTRAST TECHNIQUE: Multidetector CT imaging of the abdomen and pelvis was performed using the standard protocol following bolus administration of intravenous contrast. RADIATION DOSE REDUCTION: This exam was performed according to the departmental dose-optimization program which includes automated exposure control, adjustment of the mA and/or kV according to patient size and/or use of iterative reconstruction technique. CONTRAST:  OMNIPAQUE IOHEXOL 300 MG/ML  SOLN COMPARISON:  08/12/2022 FINDINGS: Lower chest: There are 2 left lower lobe pulmonary nodules consistent with progressive pulmonary metastases. 1.4 cm nodule image 12/2 and a 0.8 cm nodule image 15/2. No airspace disease or effusion. Hepatobiliary: Gallbladder is moderately distended without cholelithiasis or cholecystitis. Stable cysts are seen within the liver. Since the prior exam, there is loss of normal fat plane between the inferior right lobe liver and and enlarging colonic mass at the hepatic flexure. Direct invasion of the liver cannot be excluded. Pancreas: Unremarkable. No pancreatic ductal dilatation or surrounding inflammatory changes. Spleen: Normal in size without focal abnormality. Adrenals/Urinary Tract: Bilateral adrenal masses seen previously have enlarged, consistent with metastatic disease. Right adrenal mass measures up to 5.1 cm, previously 3.5 cm. The left adrenal mass measures up to 4.7 cm, previously 3.5 cm. The kidneys enhance normally and symmetrically. No urinary tract calculi or obstructive uropathy. Bladder is unremarkable. Stomach/Bowel: A large necrotic mass at the hepatic flexure of the colon has increased in size, measuring up  to 12.5 x 10.1 cm. There is increasing pericolonic fat stranding and mesenteric nodularity consistent with locally invasive disease. As discussed above, there is loss of normal fat plane between the hepatic mass and the inferior margin right lobe liver and direct invasion of the liver cannot be excluded. There is no evidence of bowel obstruction or ileus. Normal appendix right lower quadrant. Vascular/Lymphatic: Increase in the size and number of mesenteric lymph nodes surrounding the hepatic flexure mass, consistent with local nodal metastases. Largest lymph node measures 0.8 cm reference image 56/2. There is also a soft tissue mass contiguous with the left crus of the diaphragm, which appears separate from the enlarging left adrenal mass. This measures 3.5 x 3.0 cm, new since prior study and consistent with metastatic deposit. Stable atherosclerosis of the aorta and its branches. Reproductive: Prostate is unremarkable. Other: Trace pelvic free fluid. No free intraperitoneal gas. No abdominal wall hernia. Musculoskeletal: There are  no acute or destructive bony abnormalities. Reconstructed images demonstrate no additional findings. IMPRESSION: 1. Enlarging mass at the hepatic flexure of the colon consistent with progressive colonic malignancy. There is increasing pericolonic fat stranding with loss of fat plane between the mass and the right lobe liver concerning for direct hepatic involvement. 2. Increasing mesenteric adenopathy, new soft tissue mass along the crus of the left hemidiaphragm, and enlarging bilateral adrenal masses consistent with progressive intra-abdominal metastases. 3. New and enlarging left lower lobe pulmonary nodules consistent with progressive pulmonary metastatic disease. 4. Trace pelvic free fluid. 5.  Aortic Atherosclerosis (ICD10-I70.0). Electronically Signed   By: Sharlet Salina M.D.   On: 09/18/2022 18:46   DG Chest Port 1 View  Result Date: 09/18/2022 CLINICAL DATA:  Fever and  vomiting EXAM: PORTABLE CHEST 1 VIEW COMPARISON:  Radiograph 09/04/2016 and CT chest 08/22/2022 FINDINGS: Stable cardiomediastinal silhouette. Aortic atherosclerotic calcification. No focal consolidation, pleural effusion, or pneumothorax. No displaced rib fractures. IMPRESSION: No active disease. Electronically Signed   By: Minerva Fester M.D.   On: 09/18/2022 17:37    ASSESSMENT & PLAN:   67 y.o. male with:  Large bowel perforation from large right hepatic flexure colonic tumor from diffuse large B-cell lymphoma patient is status post right hemicolectomy and IVC filter placement currently on TPN status post extubation. Newly diagnosed stage IVb large B-cell lymphoma ECHo done today showed normal EF of 55% -port a cath placed by IR -Hepatitis BC and HIV testing negative Large hepatic flexure mass from large B-cell lymphoma with impending obstruction and high risk for perforation.  Possible infection of the necrotic tissue in the tumor or bowel wall currently on antibiotics. 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  Patient is a lightheadedness and some orthostatic symptoms and severe fatigue.  Rule out adrenal insufficiency. Failure to thrive due to poor p.o. intake bowel issues and new diagnosis of large B-cell lymphoma. Dehydration due to poor p.o. intake Significant abdominal discomfort from his large colonic mass with impending obstruction. Arthritis Dyslipidemia History of gout 11.  History of obstructive sleep apnea not using CPAP at home. PLAN: -Appreciate PCCM and surgical team cares. -N.p.o. and on TPN at this time with ongoing postoperative management IV antibiotics and IV fluids.  Now status post extubation remains sleepy but is easily arousable. -Temporary IVC filter placed due to inability to pharmacologically anticoagulate for VTE prophylaxis. -Any plans for  further systemic treatment of his for at least 2 to 3 weeks pending healing of his abdominal wounds and clearance by surgery with no evidence of sepsis or abscesses. -IV antibiotics per surgery. -If it appears that the patient may take more time for recovery beyond 2 to 3 weeks might need to consider additional dose of Rituxan prior to [facility] chemotherapy to allow for additional recovery time to hold lymphoma in the interim. -Oncology will follow peripherally till patient improves postoperatively.  .The total time spent in the appointment was 35 minutes* .  All of the patient's questions were answered with apparent satisfaction. The patient knows to call the clinic with any problems, questions or concerns.   Wyvonnia Lora MD MS AAHIVMS [facility] Hematology/Oncology Physician [facility]  .*Total Encounter Time as defined by the Centers for Medicare and Medicaid Services includes, in addition to the face-to-face time of a patient visit (documented in the note above) non-face-to-face time: obtaining and reviewing outside history, ordering and reviewing medications,  tests or procedures, care coordination (communications with other health care professionals or caregivers) and documentation in the medical record.

## 2022-09-26 NOTE — Progress Notes (Signed)
Nutrition Follow-up  DOCUMENTATION CODES:   Severe malnutrition in context of chronic illness  INTERVENTION:  - TPN to increase tonight to 58mL/hr per pharmacy: will provide 76g protein, 216g dextrose, 432 IL kcals = 1471 kcals - Per pharmacy: goal TPN of 132mL/hr - TPN management per pharmacy.   - Monitor magnesium, potassium, and phosphorus BID for at least 3 days, MD to replete as needed, as pt is at risk for refeeding syndrome given severe malnutrition. - Continue 100mg  thiamine x5 days.   - Monitor weight trends. Daily weights while on TPN.   NUTRITION DIAGNOSIS:   Severe Malnutrition related to chronic illness (non-Hodgkin's lymphoma) as evidenced by mild fat depletion, mild muscle depletion, energy intake < or equal to 75% for > or equal to 1 month, percent weight loss (11% in less than 1 month). *updated  GOAL:   Patient will meet greater than or equal to 90% of their needs *progressing, on TPN  MONITOR:   Diet advancement, Labs, Weight trends, I & O's  REASON FOR ASSESSMENT:   Consult Assessment of nutrition requirement/status  ASSESSMENT:   67 y.o. male admits related to abdominal pain and poor oral intakes. PMH includes arthritis and high cholesterol. 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  5/23 Admit 5/28 ex-lap, right hemicolectomy and end ileostomy; TPN started   Met with patient and wife at bedside this AM. Wife supplied all nutrition history.   UBW reported to be around 200# and wife states patient has lost 30# over the past 2 months.  Per EMR, patient has lost 21# or 11% within the past 1 month, which is severe for the time frame.   Patient has not been eating well over that time frame. Wife reports he was eating a soft diet for about 1 month and for the last 1 week PTA he was only drinking liquids. Wife states this was a recommendation by his doctor due to large hepatic flexure mass with duodenal  involvement. Was drinking 2-3 Boost a day during this time.   Patient was advanced to a soft diet during this admission from 5/25-5/28 and wife reports he was eating well but on the evening of 5/28 developed severe abdominal pain. Xray concerning for ileus/small bowel obstruction. NG placed, to LIS.   TPN started yesterday at 45mL/hr. Electrolytes remain WNL today. Per pharmacy, increasing to 7mL/hr this evening.    Medications reviewed and include: Insulin, Thiamine x5 days (ends 6/3)  Labs reviewed:  Triglycerides 239 (5/29) HA1C 5.9    NUTRITION - FOCUSED PHYSICAL EXAM:  Flowsheet Row Most Recent Value  Orbital Region Mild depletion  Upper Arm Region No depletion  Thoracic and Lumbar Region No depletion  Buccal Region Mild depletion  Temple Region Moderate depletion  Clavicle Bone Region Mild depletion  Clavicle and Acromion Bone Region Mild depletion  Scapular Bone Region Unable to assess  Dorsal Hand No depletion  Patellar Region No depletion  Anterior Thigh Region No depletion  Posterior Calf Region No depletion  Edema (RD Assessment) None  Hair Reviewed  Eyes Reviewed  Mouth Reviewed  Skin Reviewed  Nails Reviewed       Diet Order:   Diet Order             Diet NPO time specified  Diet effective now                   EDUCATION NEEDS:  Education needs have been addressed  Skin:  Skin Assessment: Reviewed RN Assessment  Last BM:  5/27  Height:  Ht Readings from Last 1 Encounters:  09/24/22 5\' 8"  (1.727 m)   Weight:  Wt Readings from Last 1 Encounters:  09/26/22 86.7 kg    BMI:  Body mass index is 29.06 kg/m.  Estimated Nutritional Needs:  Kcal:  2400-2600 kcals Protein:  120-130 gm Fluid:  >/= 2.4 L    Shelle Iron RD, LDN For contact information, refer to [facility].

## 2022-09-26 NOTE — Consult Note (Signed)
WOC Nurse ostomy follow up Stoma type/location: RLQ, end ileostomy  Stomal assessment/size: 1 3/8" round, pale, moist Peristomal assessment: intact  Treatment options for stomal/peristomal skin: 2" skin barrier Output none Ostomy pouching: 2pc. 2 3/4" cut off center, 2" skin barrier ring  Education provided:  Patient is heavily sedated Wife at bedside; engaged to learn, taking notes Explained role of ostomy nurse and creation of stoma  Explained stoma characteristics (budded, flush, color, texture, care) Demonstrated pouch change (cutting new skin barrier, measuring stoma, cleaning peristomal skin and stoma, use of barrier ring) Education on emptying when 1/3 to 1/2 full and how to empty Discussed use of wick to clean spout  Discussed bathing, diet, gas, medication use,limiting use of XR meds, discussion with pharmacist about medication currently using or using in treatment of lymphoma Discussed risk of peristomal hernia Provided patient with [employer] Answered patient/family questions:  left educational materials with wife, explained QR codes. Discussed SS program with wife. Left samples in the room for patient's wife to handle/practice with.   Provided ileostomy diet/blockage educational materials with wife.  Discussed risk of dehydration and ways to prevent.   Enrolled patient in [place] Secure Start Discharge program: Yes  WOC Nurse will follow along with you for continued support with ostomy teaching and care Nihar Klus [facility] MSN, RN, [place], CNS, [place] [phone]

## 2022-09-26 NOTE — Progress Notes (Signed)
Asked PT about cpap. PT became very aggressive and seemed somewhat confused. I do not think he can tolerate CPAP at this time. PT stable on 6L.

## 2022-09-26 NOTE — Evaluation (Signed)
Physical Therapy Evaluation Patient Details Name: Justin Cameron MRN: [id number] DOB: 1955/10/21 Today's Date: 09/26/2022  History of Present Illness  67 y.o. male with a history of OSA, newly diagnosed metastatic DLBCL who [facility] for ongoing abdominal pain, bloating, unintentional weight loss, night sweats. He had been seen in the ED 5/22 where CT showed an enlarging mass at the hepatic flexure, increased pericolonic ft stranding, increased mesenteric lymphadenopathy, enlarging bilateral adrenal masses, and new mass along crus of left diaphragm.  On 09/24/22, pt s/p Exploratory laparotomy with right hemicolectomy and end ileostomy.  On 09/25/22, pt s/p IVC filter placement  Clinical Impression  Pt admitted with above diagnosis.  Pt currently with functional limitations due to the deficits listed below (see PT Problem List). Pt will benefit from acute skilled PT to increase their independence and safety with mobility to allow discharge.  Pt lethargic and groggy during session, also he reports feeling this way as he was premedicated with IV pain meds (requested earlier premed if having therapy this afternoon).  Pt assisted with log roll technique for sitting EOB however felt uncomfortable with farther attempts to mobilize due to his cognition.  Pt will likely progress well as pain becomes more manageable and diet advances.  Will continue to assist pt with mobility in hospital in anticipation of pt returning home with spouse.        Recommendations for follow up therapy are one component of a multi-disciplinary discharge planning process, led by the attending physician.  Recommendations may be updated based on patient status, additional functional criteria and insurance authorization.  Follow Up Recommendations       Assistance Recommended at Discharge Intermittent Supervision/Assistance  Patient can return home with the following  A little help with walking  and/or transfers;A little help with bathing/dressing/bathroom;Assistance with cooking/housework;Help with stairs or ramp for entrance    Equipment Recommendations Rolling walker (2 wheels)  Recommendations for Other Services       Functional Status Assessment Patient has had a recent decline in their functional status and demonstrates the ability to make significant improvements in function in a reasonable and predictable amount of time.     Precautions / Restrictions Precautions Precautions: Fall Precaution Comments: multiple lines/leads, JP drain, ileostomy      Mobility  Bed Mobility Overal bed mobility: Needs Assistance Bed Mobility: Rolling, Sidelying to Sit, Sit to Sidelying Rolling: Mod assist Sidelying to sit: Mod assist     Sit to sidelying: Mod assist General bed mobility comments: multimodal cues for log roll technique, HR elevated to 130s bpm with activity; pt remained on face mask (mouth breather) 5L O2 and SPO2 91%    Transfers                   General transfer comment: pt declined attempting due to his cognition    Ambulation/Gait                  Stairs            Wheelchair Mobility    Modified Rankin (Stroke Patients Only)       Balance Overall balance assessment: Needs assistance Sitting-balance support: No upper extremity supported, Feet supported Sitting balance-Leahy Scale: Fair                                       Pertinent Vitals/Pain Pain Assessment  Pain Assessment: No/denies pain (premedicated with IV pain meds)    Home Living Family/patient expects to be discharged to:: Private residence Living Arrangements: Spouse/significant other   Type of Home: House Home Access: Stairs to enter Entrance Stairs-Rails: Right Entrance Stairs-Number of Steps: 4   Home Layout: Able to live on main level with bedroom/bathroom Home Equipment: Crutches      Prior Function Prior Level of Function :  Independent/Modified Independent                     Hand Dominance        Extremity/Trunk Assessment        Lower Extremity Assessment Lower Extremity Assessment: Overall WFL for tasks assessed    Cervical / Trunk Assessment Cervical / Trunk Assessment: Normal  Communication   Communication: No difficulties  Cognition Arousal/Alertness: Lethargic, Suspect due to medications Behavior During Therapy: Flat affect                                   General Comments: groggy, dysarthric, sleepy, "not comprehending" - premedicated with IV pain meds which pt feels is affecting his cognition        General Comments      Exercises     Assessment/Plan    PT Assessment Patient needs continued PT services  PT Problem List Decreased strength;Cardiopulmonary status limiting activity;Decreased activity tolerance;Decreased balance;Decreased mobility;Decreased knowledge of use of DME;Decreased knowledge of precautions       PT Treatment Interventions Gait training;DME instruction;Therapeutic exercise;Functional mobility training;Therapeutic activities;Patient/family education;Balance training    PT Goals (Current goals can be found in the Care Plan section)  Acute Rehab PT Goals PT Goal Formulation: With patient Time For Goal Achievement: 10/10/22 Potential to Achieve Goals: Good    Frequency Min 1X/week     Co-evaluation               AM-PAC PT "6 Clicks" Mobility  Outcome Measure Help needed turning from your back to your side while in a flat bed without using bedrails?: A Lot Help needed moving from lying on your back to sitting on the side of a flat bed without using bedrails?: A Lot Help needed moving to and from a bed to a chair (including a wheelchair)?: A Lot Help needed standing up from a chair using your arms (e.g., wheelchair or bedside chair)?: Total Help needed to walk in hospital room?: Total Help needed climbing 3-5 steps with a  railing? : Total 6 Click Score: 9    End of Session Equipment Utilized During Treatment: Gait belt Activity Tolerance: Patient limited by fatigue Patient left: in bed;with call bell/phone within reach;with family/visitor present;with bed alarm set Nurse Communication: Mobility status PT Visit Diagnosis: Difficulty in walking, not elsewhere classified (R26.2)    Time: 1610-9604 PT Time Calculation (min) (ACUTE ONLY): 27 min   Charges:   PT Evaluation $PT Eval Low Complexity: 1 Low PT Treatments $Therapeutic Activity: 8-22 mins       Paulino Door, DPT Physical Therapist Acute Rehabilitation Services Office: [phone]   [name] 09/26/2022, 4:01 PM

## 2022-09-26 NOTE — Progress Notes (Addendum)
   NAME:  Justin Cameron, MRN:  [id number], DOB:  1955/10/06, LOS: 7 ADMISSION DATE:  09/19/2022, CONSULTATION DATE:  09/24/22 REFERRING MD:  Jarvis Newcomer, CHIEF COMPLAINT:  Abd pain   History of Present Illness:  67 year old man w/ hx of OSA, recently diagnosed large B cell lymphoma causing threatened large bowel obstruction.  Came in after chemo b/c more bloating/pain.  NGT placed, O2 needs went from RA to 15LPM so PCCM consulted.  Denies any cough.  +dyspneic with minimal exertion.  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 On low dose neo? Pain under control On 6LPM by facemask.  Objective   Blood pressure 116/63, pulse (!) 104, temperature 98.3 F (36.8 C), temperature source Axillary, resp. rate 14, height 5\' 8"  (1.727 m), weight 86.7 kg, SpO2 94 %.        Intake/Output Summary (Last 24 hours) at 09/26/2022 0707 Last data filed at 09/26/2022 0600 Gross per 24 hour  Intake 4182.49 ml  Output 1710 ml  Net 2472.49 ml    Filed Weights   09/24/22 1413 09/25/22 0500 09/26/22 0429  Weight: 81 kg 81.1 kg 86.7 kg    Examination: No distress Lungs diminished bases Abd inicision dressed without strikethrough JP small serosanguinous fluid Ostomy pink, no output Abd soft otherwise Ext warm TPN through PICC  Labs look fine with exception of low albumin and H/H drop  Resolved Hospital Problem list   N/A  Assessment & Plan:  Partial LBO from DLBCL mass s/p ex lap, diverting ostomy Postoperative vent management- should be okay to come off Hypoxemia present pre-op- derecruitment + probably VTE BL LE DVT- filter in place due to risk of hemorrhage with AC for DLBCL At risk TLS- on  allopurinol Postop ABLA Postop shock- related to blood loss and fluid shifts  - 1 unit blood, recheck around 12PM - 100g 25% albumin - Wean neo for MAP 65 - Up to chair and walk if we can - Await return of bowel function - Drain/dressing/ostomy management per CCS - Eventual need for chemo once heals from surgery - Depending on pressor needs may be able to do stepdown tomorrow - IS as able, wean O2 for sats 90%  Best Practice (right click and "Reselect all SmartList Selections" daily)   Best practice:  Diet: TPN Pain/Anxiety/Delirium protocol (if indicated): Off VAP protocol (if indicated): in place DVT prophylaxis: will need to ask onc at some point once H/H stable if we can challenge GI prophylaxis: PPI Glucose control: SSI q4h Mobility: up to chair Code Status: full  Family Communication: updated at bedside Disposition: ICU pending pressor liberation  32 min cc time Myrla Halsted MD PCCM

## 2022-09-26 NOTE — Progress Notes (Signed)
PT Cancellation Note  Patient Details Name: Justin Cameron MRN: [id number] DOB: 11-26-1955   Cancelled Treatment:    Reason Eval/Treat Not Completed: Fatigue/lethargy limiting ability to participate Pt very fatigued and requesting to rest at this time.  Will attempt to check back as schedule permits.    [name] 09/26/2022, 11:42 AM Paulino Door, DPT Physical Therapist Acute Rehabilitation Services Office: [phone]

## 2022-09-26 NOTE — Progress Notes (Signed)
eLink Physician-Brief Progress Note Patient Name: Justin Cameron DOB: [birth year] MRN: [id number]   Date of Service  09/26/2022  HPI/Events of Note  Notified of drop in H&H from 11.5 --> 7.1 with no signs of active bleeding.   Pt is a 66/M with large B cell lymphoma with large bowel obstruction s/p ex lap, bowel resection and ileostomy.   Pt remained hemodynamically stable overnight. No signs of active bleeding.  No significant output from JP drain.  Blood sample drawn from the central line and done twice.   eICU Interventions  Repeat CBC at 10AM.       Intervention Category Intermediate Interventions: Other:  Larinda Buttery 09/26/2022, 6:35 AM

## 2022-09-26 NOTE — Progress Notes (Addendum)
PHARMACY - TOTAL PARENTERAL NUTRITION CONSULT NOTE   Indication: Prolonged ileus  Patient Measurements: Height: 5\' 8"  (172.7 cm) Weight: 86.7 kg (191 lb 2.2 oz) IBW/kg (Calculated) : 68.4 TPN AdjBW (KG): 81 Body mass index is 29.06 kg/m. 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  He was supposed to start chemo, but developed severe acute abdominal pain on 5/28.  NG tube placed, imaging to confirm placement shows possible free air and he was taken to OR on 5/28.  Pharmacy is consulted to dose TPN for expected prolonged ileus.    Glucose / Insulin: No hx DM, no PTA medications.   - CBG range: 115-122. Goal: 100-180. 4 units SSI/24 hrs. - Solumedrol 250mg  5/23 and 5/24, solumedrol 125mg  5/25 and 5/26, dexamethasone 10mg  5/28 Electrolytes: All WNL including CorrCa (9). K (3.8) on lower end of normal Renal: SCr improved (baseline ~0.7), BUN slightly elevated but stable Hepatic: AST/ALT, Tbili WNL. TG 239 while on propofol (now d/c) Intake / Output: +2.5L -mIVF: LR @ 60 mL/hr -Output: NG 50mL, drain 185 mL, Urine 1525 mL  GI Imaging:  -5/28  Lucency in the right upper quadrant was not seen on the previous images. Free intraperitoneal air is not excluded.  GI Surgeries / Procedures:  -5/28 Exploratory laparotomy with right hemicolectomy and end ileostomy.  -5/29 IVC filter placed  Central access: Port (09/20/22), Triple lumen PICC placed 5/29 TPN start date: 5/29  Nutritional Goals: Goal TPN rate of 100 mL/hr provides 127 g of protein and 2453 kcals per day  RD Assessment: Estimated Needs Total Energy Estimated Needs: 2400-2600 kcals Total Protein Estimated Needs: 120-130 gm Total Fluid Estimated Needs: >/= 2.4 L  Current Nutrition:  NPO Propofol: 5/28-5/29, now off  Plan:  Now: Decrease frequency of CBG checks and sSSI  from q4h to q6h KCl 10 mEq IV x 2 runs  At 1800: Increase TPN to 60 mL/hr Will provide 76 g protein, 1471 kcal Electrolytes in TPN: Slowly add back Mg, Phos Na 50 mEq/L, K 30 mEq/L, Ca 5 mEq/L, Mg 3 mEq/L, and Phos 5 mmol/L.  Cl:Ac 1:1 Add standard MVI and trace elements to TPN Thiamine 100 mg IV daily x5 days Continue sensitive SSI q6h and adjust as needed  Reduce MIVF to 40 mL/hr at 1800 Monitor TPN labs on Mon/Thurs. Recheck electrolytes and TG with AM labs tomorrow Monitor electrolytes closely if chemotherapy initiated as pt will be at high risk for TLS  Cindi Carbon, PharmD 09/26/22 7:38 AM

## 2022-09-26 NOTE — Progress Notes (Signed)
Afib, ST look a little strange No chest pain Check BMP, trops Amio no bolus, 1 unit pRBC Still not AC candidate due to ongoing H/H loss, high risk of lymphoma hemorrhagic transformation   Myrla Halsted MD PCCM

## 2022-09-26 NOTE — Progress Notes (Signed)
2 Days Post-Op   Subjective/Chief Complaint:awake and Awake and alert , feels tired   Objective: Vital signs in last 24 hours: Temp:  [97.5 F (36.4 C)-98.9 F (37.2 C)] 98.6 F (37 C) (05/30 0800) Pulse Rate:  [90-116] 104 (05/30 0830) Resp:  [9-31] 17 (05/30 0830) BP: (75-137)/(48-81) 115/57 (05/30 0830) SpO2:  [85 %-97 %] 95 % (05/30 0830) Weight:  [86.7 kg] 86.7 kg (05/30 0429) Last BM Date : 09/23/22  Intake/Output from previous day: 05/29 0701 - 05/30 0700 In: 4306.9 [I.V.:4143.3; NG/GT:30; IV Piggyback:133.6] Out: 1760 [Urine:1525; Emesis/NG output:50; Drains:185] Intake/Output this shift: No intake/output data recorded.  Wound open and clean JP serous     Lab Results:  Recent Labs    09/25/22 0439 09/26/22 0514  WBC 36.9* 15.2*  HGB 11.5* 7.1*  HCT 36.9* 23.4*  PLT 439* 328   BMET Recent Labs    09/25/22 0439 09/26/22 0407  NA 139 137  K 4.2 3.8  CL 107 106  CO2 22 24  GLUCOSE 148* 122*  BUN 29* 31*  CREATININE 1.34* 0.96  CALCIUM 7.8* 7.4*   PT/INR No results for input(s): "LABPROT", "INR" in the last 72 hours. ABG Recent Labs    09/24/22 1532 09/25/22 0432  PHART 7.23* 7.38  HCO3 25.5 20.8    Studies/Results: VAS [place] LOWER EXTREMITY VENOUS (DVT)  Result Date: 09/25/2022  Lower Venous DVT Study Patient Name:  Justin Cameron  Date of Exam:   09/25/2022 Medical Rec #: [id number]            Accession #:    [id number] Date of Birth: [birth year]             Patient Gender: M Patient Age:   66 years Exam Location:  [facility] Procedure:      VAS [place] LOWER EXTREMITY VENOUS (DVT) Referring Phys: Hosie Spangle --------------------------------------------------------------------------------  Indications: Edema.  Risk Factors: Chemotherapy Cancer Lymphoma. Comparison Study: No previous exams Performing Technologist: Jody Hill RVT, RDMS  Examination Guidelines: A complete evaluation includes B-mode imaging, spectral Doppler, color Doppler,  and power Doppler as needed of all accessible portions of each vessel. Bilateral testing is considered an integral part of a complete examination. Limited examinations for reoccurring indications may be performed as noted. The reflux portion of the exam is performed with the patient in reverse Trendelenburg.  +---------+---------------+---------+-----------+----------+--------------+ RIGHT    CompressibilityPhasicitySpontaneityPropertiesThrombus Aging +---------+---------------+---------+-----------+----------+--------------+ CFV      Full           Yes      Yes                                 +---------+---------------+---------+-----------+----------+--------------+ SFJ      Full                                                        +---------+---------------+---------+-----------+----------+--------------+ FV Prox  Full           Yes      Yes                                 +---------+---------------+---------+-----------+----------+--------------+ FV Mid   Full  Yes      Yes                                 +---------+---------------+---------+-----------+----------+--------------+ FV DistalFull           Yes      Yes                                 +---------+---------------+---------+-----------+----------+--------------+ PFV      Full                                                        +---------+---------------+---------+-----------+----------+--------------+ POP      Full           Yes      Yes                                 +---------+---------------+---------+-----------+----------+--------------+ PTV      None           No       No                   Acute          +---------+---------------+---------+-----------+----------+--------------+ PERO     None           No       No                   Acute          +---------+---------------+---------+-----------+----------+--------------+    +---------+---------------+---------+-----------+----------+--------------+ LEFT     CompressibilityPhasicitySpontaneityPropertiesThrombus Aging +---------+---------------+---------+-----------+----------+--------------+ CFV      Full           Yes      Yes                                 +---------+---------------+---------+-----------+----------+--------------+ SFJ      Full                                                        +---------+---------------+---------+-----------+----------+--------------+ FV Prox  Full           Yes      Yes                                 +---------+---------------+---------+-----------+----------+--------------+ FV Mid   Full           Yes      Yes                                 +---------+---------------+---------+-----------+----------+--------------+ FV DistalFull           Yes      Yes                                 +---------+---------------+---------+-----------+----------+--------------+  PFV      Full                                                        +---------+---------------+---------+-----------+----------+--------------+ POP      Full           Yes      Yes                                 +---------+---------------+---------+-----------+----------+--------------+ PTV      Full                                                        +---------+---------------+---------+-----------+----------+--------------+ PERO     None           No       No                   Acute          +---------+---------------+---------+-----------+----------+--------------+     Summary: BILATERAL: -No evidence of popliteal cyst, bilaterally. RIGHT: - Findings consistent with acute deep vein thrombosis involving the right peroneal veins, and right posterior tibial veins.  LEFT: - Findings consistent with acute deep vein thrombosis involving the left peroneal veins.  *See table(s) above for measurements and observations.  Electronically signed by Coral Else MD on 09/25/2022 at 5:07:37 PM.    Final    IR IVC FILTER PLMT / S&I Lenise Arena GUID/MOD SED  Result Date: 09/25/2022 CLINICAL DATA:  67 year old male with history of diffuse large B-cell lymphoma and lower extremity deep vein thrombosis with contraindication to anticoagulation due to high risk of lymphoma hemorrhagic transformation. EXAM: 1. ULTRASOUND GUIDANCE FOR VASCULAR ACCESS OF THE RIGHT internal jugular VEIN. 2. IVC VENOGRAM. 3. PERCUTANEOUS IVC FILTER PLACEMENT. ANESTHESIA/SEDATION: Moderate (conscious) sedation was employed during this procedure. A total of Versed 1 mg and Fentanyl 50 mcg was administered intravenously. Moderate Sedation Time: 12 minutes. The patient's level of consciousness and vital signs were monitored continuously by radiology nursing throughout the procedure under my direct supervision. CONTRAST:  30mL OMNIPAQUE IOHEXOL 300 MG/ML SOLN, 7mL OMNIPAQUE IOHEXOL 300 MG/ML SOLN FLUOROSCOPY TIME:  One hundred five mGy. PROCEDURE: The procedure, risks, benefits, and alternatives were explained to the patient. Questions regarding the procedure were encouraged and answered. The patient understands and consents to the procedure. The patient was prepped with Betadine in a sterile fashion, and a sterile drape was applied covering the operative field. A sterile gown and sterile gloves were used for the procedure. Local anesthesia was provided with 1% Lidocaine. Under direct ultrasound guidance, a 21 gauge needle was advanced into the right internal jugular vein with ultrasound image documentation performed. After securing access with a micropuncture dilator, a guidewire was advanced into the inferior vena cava. A deployment sheath was advanced over the guidewire. This was utilized to perform IVC venography. The deployment sheath was further positioned in an appropriate location for filter deployment. A Denali IVC filter was then advanced in the sheath. This was  then fully deployed in the infrarenal IVC. Final filter position was confirmed with a  fluoroscopic spot image. Contrast injection was also performed through the sheath under fluoroscopy to confirm patency of the IVC at the level of the filter. After the procedure the sheath was removed and hemostasis obtained with manual compression. COMPLICATIONS: None. FINDINGS: IVC venography demonstrates a normal caliber IVC with no evidence of thrombus. Renal veins are identified bilaterally. The IVC filter was successfully positioned below the level of the renal veins and is appropriately oriented. This IVC filter has both permanent and retrievable indications. IMPRESSION: Placement of percutaneous IVC filter in infrarenal IVC. IVC venogram shows no evidence of IVC thrombus and normal caliber of the inferior vena cava. This filter does have both permanent and retrievable indications. PLAN: This IVC filter is potentially retrievable. The patient will be assessed for filter retrieval by Interventional Radiology in approximately 8-12 weeks. Further recommendations regarding filter retrieval, continued surveillance or declaration of device permanence, will be made at that time. Marliss Coots, MD [facility] Radiology Electronically Signed   By: Marliss Coots M.D.   On: 09/25/2022 15:20   ECHOCARDIOGRAM LIMITED  Result Date: 09/25/2022    ECHOCARDIOGRAM LIMITED REPORT   Patient Name:   Justin Cameron Date of Exam: 09/25/2022 Medical Rec #:  [id number]           Height:       68.0 in Accession #:    [id number]          Weight:       178.8 lb Date of Birth:  1955-05-19            BSA:          1.949 m Patient Age:    66 years            BP:           94/56 mmHg Patient Gender: M                   HR:           100 bpm. Exam Location:  Inpatient Procedure: Limited Echo, Cardiac Doppler, Color Doppler and Intracardiac            Opacification Agent Indications:    Pulmonary embolis   History:        Patient has prior history of Echocardiogram examinations, most                 recent 09/20/2022.  Sonographer:    Lucy Antigua Referring Phys: [id number] Adam Phenix IMPRESSIONS  1. Left ventricular ejection fraction, by estimation, is 45 to 50%. The left ventricle has mildly decreased function. There is the interventricular septum is flattened in systole, consistent with right ventricular pressure overload.  2. Right ventricular systolic function is mildly reduced. The right ventricular size is mildly enlarged.  3. Mild mitral valve regurgitation.  4. The aortic valve is tricuspid. Aortic valve sclerosis is present, with no evidence of aortic valve stenosis. Comparison(s): Prior images reviewed side by side. Compared to recent prior study, LVEF is mildly decreased, with evidence of new RV strain. This study uses echo contrast. FINDINGS  Left Ventricle: Left ventricular ejection fraction, by estimation, is 45 to 50%. The left ventricle has mildly decreased function. Definity contrast agent was given IV to delineate the left ventricular endocardial borders. The interventricular septum is  flattened in systole, consistent with right ventricular pressure overload. Right Ventricle: The right ventricular size is mildly enlarged. Right ventricular systolic function is mildly reduced. Mitral Valve:  Mild mitral valve regurgitation. Tricuspid Valve: Tricuspid valve regurgitation is mild. Aortic Valve: The aortic valve is tricuspid. Aortic valve sclerosis is present, with no evidence of aortic valve stenosis. Pulmonic Valve: The pulmonic valve was normal in structure. Pulmonic valve regurgitation is trivial. No evidence of pulmonic stenosis. Additional Comments: Spectral Doppler performed. Color Doppler performed.   LV Volumes (MOD) LV vol d, MOD A2C: 77.5 ml LV vol d, MOD A4C: 70.7 ml LV vol s, MOD A2C: 44.8 ml LV vol s, MOD A4C: 37.7 ml LV SV MOD A2C:     32.7 ml LV SV MOD A4C:     70.7 ml LV SV MOD BP:       35.1 ml Riley Lam MD Electronically signed by Riley Lam MD Signature Date/Time: 09/25/2022/1:37:02 PM    Final    [place] EKG SITE RITE  Result Date: 09/25/2022 If Site Rite image not attached, placement could not be confirmed due to current cardiac rhythm.  DG Chest 1 View  Result Date: 09/24/2022 CLINICAL DATA:  Check endotracheal tube placement EXAM: PORTABLE CHEST 1 VIEW COMPARISON:  Film from earlier in the same day. FINDINGS: Gastric catheter is noted within the stomach. Endotracheal tube is noted in satisfactory position. Right chest wall port is seen and stable. Lungs are well aerated bilaterally. Minimal right effusion is seen. No acute bony abnormality is noted. IMPRESSION: Minimal right-sided effusion. Electronically Signed   By: Alcide Clever M.D.   On: 09/24/2022 20:25   DG CHEST PORT 1 VIEW  Result Date: 09/24/2022 CLINICAL DATA:  Evaluate gastric catheter placement EXAM: PORTABLE CHEST 1 VIEW COMPARISON:  09/18/2022 FINDINGS: Cardiac shadow is stable but accentuated by the portable technique. The overall inspiratory effort is poor with right basilar atelectasis. Right chest wall port is seen in satisfactory position. No pneumothorax is noted. Gastric catheter is noted with the tip superimposed over the left mainstem bronchus. This may still lie within the mid esophagus. This should be withdrawn and readvanced. IMPRESSION: Gastric catheter as described. This should be withdrawn and readvanced into the stomach. Electronically Signed   By: Alcide Clever M.D.   On: 09/24/2022 15:09   DG Abd Portable 1V  Result Date: 09/24/2022 CLINICAL DATA:  Check gastric catheter placement EXAM: PORTABLE ABDOMEN - 1 VIEW COMPARISON:  Film from earlier in the same day. FINDINGS: Scattered large and small bowel gas is noted. Retained fecal material within the colon is seen. No free air is noted. Gastric catheter is not visualized. Chest x-ray may be helpful for further evaluation.  IMPRESSION: Gastric catheter is not visualized on this exam. Electronically Signed   By: Alcide Clever M.D.   On: 09/24/2022 15:07   DG Abd 1 View  Result Date: 09/24/2022 CLINICAL DATA:  Nasogastric placement EXAM: ABDOMEN - 1 VIEW COMPARISON:  Earlier same day FINDINGS: Nasogastric tube enters the stomach and coils in the fundus. Less intestinal gas than was noted on the prior image. Lucency in the right upper quadrant was not seen on the previous images. Free intraperitoneal air is not excluded. If there is clinical concern regarding this possibility, either left-side-down decubitus imaging or CT abdomen would be suggested. IMPRESSION: 1. Nasogastric tube enters the stomach and coils in the fundus. 2. Less intestinal gas than was noted on the prior image. 3. Lucency in the right upper quadrant was not seen on the previous images. Free intraperitoneal air is not excluded. See above discussion. Electronically Signed   By: Paulina Fusi M.D.   On:  09/24/2022 12:14   DG Abd Portable 1V  Result Date: 09/24/2022 CLINICAL DATA:  Abdominal pain. EXAM: PORTABLE ABDOMEN - 1 VIEW COMPARISON:  None Available. FINDINGS: Mildly dilated air-filled small bowel loops are noted concerning for ileus or possibly distal small bowel obstruction. No colonic dilatation is noted. No abnormal calcifications are noted. IMPRESSION: Mildly dilated air-filled small bowel loops are noted concerning for ileus or possibly distal small bowel obstruction. Electronically Signed   By: Lupita Raider M.D.   On: 09/24/2022 09:05    Anti-infectives: Anti-infectives (From admission, onward)    Start     Dose/Rate Route Frequency Ordered Stop   09/20/22 1402  ceFAZolin (ANCEF) IVPB 2g/100 mL premix        over 30 Minutes Intravenous Continuous PRN 09/20/22 1411 09/20/22 1402   09/20/22 0600  piperacillin-tazobactam (ZOSYN) IVPB 3.375 g        3.375 g 12.5 mL/hr over 240 Minutes Intravenous Every 8 hours 09/19/22 1943     09/19/22 1800   amoxicillin-clavulanate (AUGMENTIN) 875-125 MG per tablet 1 tablet  Status:  Discontinued        1 tablet Oral Every 12 hours 09/19/22 1659 09/19/22 1939       Assessment/Plan: s/p Procedure(s): EXPLORATORY LAPAROTOMY, BOWEL RESECTION, ILEOSTOMY (N/A) Stable extubated  HGB 7 but JP output serous - ? Error or PICC line draw- can give 1 U PRBC from surgery standpoint but not concerned by active bleeding looking at pt / drains etc - DEFER TO ONCOLOGY /CCM   TNA   ABX    D/C NGT   CONSIDER CLEAR TOMORROW IF NOT NAUSEATED   OOB  PT /OT   LOS: 7 days    Maisie Fus A Rayland Hamed 09/26/2022

## 2022-09-27 ENCOUNTER — Other Ambulatory Visit: Payer: Self-pay

## 2022-09-27 DIAGNOSIS — C8330 Diffuse large B-cell lymphoma, unspecified site: Secondary | ICD-10-CM | POA: Diagnosis not present

## 2022-09-27 DIAGNOSIS — K5669 Other partial intestinal obstruction: Secondary | ICD-10-CM | POA: Diagnosis not present

## 2022-09-27 LAB — BPAM RBC
Blood Product Expiration Date: 202406302359
Blood Product Expiration Date: 202407052359
ISSUE DATE / TIME: 202405300849
ISSUE DATE / TIME: 202405302009
Unit Type and Rh: 5100
Unit Type and Rh: 5100

## 2022-09-27 LAB — TYPE AND SCREEN
ABO/RH(D): O POS
Antibody Screen: NEGATIVE
Unit division: 0
Unit division: 0

## 2022-09-27 LAB — BASIC METABOLIC PANEL
Anion gap: 8 (ref 5–15)
BUN: 23 mg/dL (ref 8–23)
CO2: 24 mmol/L (ref 22–32)
Calcium: 8.1 mg/dL — ABNORMAL LOW (ref 8.9–10.3)
Chloride: 106 mmol/L (ref 98–111)
Creatinine, Ser: 0.79 mg/dL (ref 0.61–1.24)
GFR, Estimated: >60 mL/min (ref >60)
Glucose, Bld: 110 mg/dL — ABNORMAL HIGH (ref 70–99)
Potassium: 3.4 mmol/L — ABNORMAL LOW (ref 3.5–5.1)
Sodium: 138 mmol/L (ref 135–145)

## 2022-09-27 LAB — LACTIC ACID, PLASMA: Lactic Acid, Venous: 2.1 mmol/L (ref 0.5–1.9)

## 2022-09-27 LAB — MAGNESIUM: Magnesium: 2.3 mg/dL (ref 1.7–2.4)

## 2022-09-27 LAB — CBC
HCT: 24.6 % — ABNORMAL LOW (ref 39.0–52.0)
Hemoglobin: 7.7 g/dL — ABNORMAL LOW (ref 13.0–17.0)
MCH: 25.9 pg — ABNORMAL LOW (ref 26.0–34.0)
MCHC: 31.3 g/dL (ref 30.0–36.0)
MCV: 82.8 fL (ref 80.0–100.0)
Platelets: 183 10*3/uL (ref 150–400)
RBC: 2.97 MIL/uL — ABNORMAL LOW (ref 4.22–5.81)
RDW: 17.1 % — ABNORMAL HIGH (ref 11.5–15.5)
WBC: 10.6 10*3/uL — ABNORMAL HIGH (ref 4.0–10.5)
nRBC: 0.2 % (ref 0.0–0.2)

## 2022-09-27 LAB — GLUCOSE, CAPILLARY
Glucose-Capillary: 88 mg/dL (ref 70–99)
Glucose-Capillary: 122 mg/dL — ABNORMAL HIGH (ref 70–99)
Glucose-Capillary: 101 mg/dL — ABNORMAL HIGH (ref 70–99)
Glucose-Capillary: 108 mg/dL — ABNORMAL HIGH (ref 70–99)

## 2022-09-27 LAB — TROPONIN I (HIGH SENSITIVITY): Troponin I (High Sensitivity): 61 ng/L — ABNORMAL HIGH (ref <18)

## 2022-09-27 LAB — TRIGLYCERIDES: Triglycerides: 204 mg/dL — ABNORMAL HIGH (ref <150)

## 2022-09-27 LAB — PHOSPHORUS: Phosphorus: 2 mg/dL — ABNORMAL LOW (ref 2.5–4.6)

## 2022-09-27 MED ORDER — POTASSIUM CHLORIDE 10 MEQ/100ML IV SOLN
10.0000 meq | INTRAVENOUS | Status: AC
  Administered 2022-09-27 (×2): 10 meq via INTRAVENOUS
  Filled 2022-09-27: qty 100

## 2022-09-27 MED ORDER — POTASSIUM PHOSPHATES 15 MMOLE/5ML IV SOLN
20.0000 mmol | Freq: Once | INTRAVENOUS | Status: AC
  Administered 2022-09-27: 20 mmol via INTRAVENOUS
  Filled 2022-09-27: qty 6.67

## 2022-09-27 MED ORDER — FENTANYL CITRATE PF 50 MCG/ML IJ SOSY: 25.0000 ug | PREFILLED_SYRINGE | Freq: Once | INTRAMUSCULAR | Status: AC

## 2022-09-27 MED ORDER — FENTANYL CITRATE PF 50 MCG/ML IJ SOSY
PREFILLED_SYRINGE | INTRAMUSCULAR | Status: AC
  Administered 2022-09-27: 25 ug via INTRAVENOUS
  Filled 2022-09-27: qty 1

## 2022-09-27 MED ORDER — SODIUM CHLORIDE 0.9 % IV SOLN
12.5000 mg | Freq: Four times a day (QID) | INTRAVENOUS | Status: DC | PRN
  Administered 2022-09-27 – 2022-09-29 (×3): 12.5 mg via INTRAVENOUS
  Filled 2022-09-27 (×4): qty 0.5

## 2022-09-27 MED ORDER — TRAVASOL 10 % IV SOLN
INTRAVENOUS | Status: AC
  Filled 2022-09-27: qty 763.2

## 2022-09-27 MED ORDER — POTASSIUM CHLORIDE 10 MEQ/50ML IV SOLN
10.0000 meq | INTRAVENOUS | Status: AC
  Administered 2022-09-27 (×2): 10 meq via INTRAVENOUS
  Filled 2022-09-27 (×2): qty 50

## 2022-09-27 MED ORDER — HYDROMORPHONE HCL 1 MG/ML IJ SOLN
0.5000 mg | INTRAMUSCULAR | Status: DC | PRN
  Administered 2022-09-27: 0.5 mg via INTRAVENOUS
  Administered 2022-09-27 (×2): 1 mg via INTRAVENOUS
  Administered 2022-09-27: 0.5 mg via INTRAVENOUS
  Administered 2022-09-28 – 2022-09-30 (×22): 1 mg via INTRAVENOUS
  Filled 2022-09-27 (×26): qty 1

## 2022-09-27 NOTE — Progress Notes (Signed)
eLink Physician-Brief Progress Note Patient Name: Justin Cameron DOB: 22-Oct-1955 MRN: [id number]   Date of Service  09/27/2022  HPI/Events of Note  67 year old male in ICU status post ruptured bowel/large bowel obstruction for which she had ex lap, bowel resection end ileostomy formation. Febrile to 101.7 tonight.  Heart rate sustaining in the 160s.  He is hemodynamically stable. He also has some electrolyte imbalances.  eICU Interventions  Examined patient in his room by camera.  He is drowsy but that has been attributed to him receiving lorazepam 0.5 mg a few moments earlier.  He does however awake and regard his wife.  Heart rate remains in the 160s. I gave him a dose of 2.5 mg IV of metoprolol with which his heart rate dropped into the 90s. Give Tylenol suppository for fever. Replete potassium.     Intervention Category Major Interventions: Arrhythmia - evaluation and management  Carilyn Goodpasture 09/27/2022, 12:00 AM

## 2022-09-27 NOTE — Progress Notes (Signed)
NAME:  Justin Cameron, MRN:  [id number], DOB:  [birth year], LOS: 8 ADMISSION DATE:  09/19/2022, CONSULTATION DATE:  09/24/22 REFERRING MD:  Jarvis Newcomer, CHIEF COMPLAINT:  Abd pain   History of Present Illness:  67 year old man w/ hx of OSA, recently diagnosed large B cell lymphoma causing threatened large bowel obstruction.  Came in after chemo b/c more bloating/pain.  NGT placed, O2 needs went from RA to 15LPM so PCCM consulted.  Denies any cough.  +dyspneic with minimal exertion.  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  Received amiodarone, infusing currently 1 unit PRBC 5/30 6 L/min nasal cannula Precedex off Potassium given overnight  Objective   Blood pressure (!) 117/52, pulse (!) 104, temperature 99.6 F (37.6 C), temperature source Axillary, resp. rate (!) 26, height 5\' 8"  (1.727 m), weight 86.8 kg, SpO2 97 %.        Intake/Output Summary (Last 24 hours) at 09/27/2022 0944 Last data filed at 09/27/2022 0525 Gross per 24 hour  Intake 1861.01 ml  Output 930 ml  Net 931.01 ml   Filed Weights   09/25/22 0500 09/26/22 0429 09/27/22 0500  Weight: 81.1 kg 86.7 kg 86.8 kg    Examination: Up to chair A bit lethargic, sleepy but will open eyes, answer questions, interacts appropriately.  Moves all extremities Decreased basal breath sounds but otherwise clear Heart irregular, 70s.  Atrial fibrillation on monitor Abdominal incision dressed.  JP drain in place.  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  Suspect due to postop stressors, no known history History of sleep apnea, not on home CPAP  -Zosyn, started 5/24 (day 7 on 5/31) -Follow CBC -Poor anticoagulation candidate due to risk postoperatively and of hemorrhagic conversion lymphoma.  He does have an IVC filter in place given history of VTE -Pressors weaned to off -Push PT/OT/ambulation/I-S and pulmonary hygiene -Appreciate surgery management of dressing, ostomy, JP output -Okay for clear diet 5/31 -Remains on TPN -Will need to be back on chemotherapy at some point but no treatment recommended until after he fully recovers from surgery -Replace potassium and phosphorus -Amiodarone for at least 1 more day.  May be able to discontinue if he converts back to NSR since this is not a chronic problem -Will decrease Dilaudid range as he is a bit lethargic after receiving this morning -Could initiate nocturnal CPAP if he is willing to wear it.    Best Practice (right click and "Reselect all SmartList Selections" daily)   Best practice:  Diet: TPN, starting clears 5/31 Pain/Anxiety/Delirium protocol (if indicated): Off VAP protocol (if indicated): in place DVT prophylaxis: will need to ask onc at some point once H/H stable if we can challenge  GI prophylaxis: PPI Glucose control: SSI q4h Mobility: up to chair Code Status: full  Family Communication: updated at bedside Disposition: To stepdown status on 5/31   Levy Pupa, MD, PhD 09/27/2022, 9:57 AM [facility] [phone] or if no answer  before 7:00PM call [phone] For any issues after 7:00PM please call eLink [phone]

## 2022-09-27 NOTE — Progress Notes (Signed)
   09/27/22 2131  BiPAP/CPAP/SIPAP  $ Non-Invasive Home Ventilator  Initial  $ Face Mask Medium Yes  BiPAP/CPAP/SIPAP Pt Type Adult  BiPAP/CPAP/SIPAP Resmed  Mask Type Full face mask  Mask Size Medium  Respiratory Rate 17 breaths/min  Flow Rate 6 lpm  Patient Home Equipment No  Auto Titrate Yes (5-20 cmH2O  PS 4)

## 2022-09-27 NOTE — Progress Notes (Signed)
3 Days Post-Op   Subjective/Chief Complaint: Patient awake, alert but very weak.  Wife and family at bedside.   Objective: Vital signs in last 24 hours: Temp:  [98.3 F (36.8 C)-101.7 F (38.7 C)] 99.6 F (37.6 C) (05/31 0400) Pulse Rate:  [89-168] 89 (05/31 0600) Resp:  [14-31] 24 (05/31 0600) BP: (102-152)/(42-66) 123/48 (05/31 0600) SpO2:  [91 %-100 %] 98 % (05/31 0600) Weight:  [86.8 kg] 86.8 kg (05/31 0500) Last BM Date : 09/23/22  Intake/Output from previous day: 05/30 0701 - 05/31 0700 In: 2239.7 [I.V.:1214.9; Blood:657.5; NG/GT:30; IV Piggyback:332.3] Out: 1390 [Urine:1300; Drains:90] Intake/Output this shift: No intake/output data recorded.  Incision/Wound: Wound open and packed.  He is clean.  Ostomy pink.  Minimal output noted.  JP serosanguineous.  Lab Results:  Recent Labs    09/26/22 0826 09/26/22 1309  WBC 14.2* 10.4  HGB 6.9* 7.2*  HCT 22.5* 22.6*  PLT 274 199   BMET Recent Labs    09/26/22 1841 09/27/22 0758  NA 140 138  K 3.5 3.4*  CL 107 106  CO2 25 24  GLUCOSE 111* 110*  BUN 23 23  CREATININE 0.85 0.79  CALCIUM 7.9* 8.1*   PT/INR No results for input(s): "LABPROT", "INR" in the last 72 hours. ABG Recent Labs    09/24/22 1532 09/25/22 0432  PHART 7.23* 7.38  HCO3 25.5 20.8    Studies/Results: VAS [place] LOWER EXTREMITY VENOUS (DVT)  Result Date: 09/25/2022  Lower Venous DVT Study Patient Name:  Justin Cameron  Date of Exam:   09/25/2022 Medical Rec #: [id number]            Accession #:    [id number] Date of Birth: [birth year]             Patient Gender: M Patient Age:   67 years Exam Location:  [facility] Procedure:      VAS [place] LOWER EXTREMITY VENOUS (DVT) Referring Phys: Hosie Spangle --------------------------------------------------------------------------------  Indications: Edema.  Risk Factors: Chemotherapy Cancer Lymphoma. Comparison Study: No previous exams Performing Technologist: Jody Hill RVT, RDMS  Examination  Guidelines: A complete evaluation includes B-mode imaging, spectral Doppler, color Doppler, and power Doppler as needed of all accessible portions of each vessel. Bilateral testing is considered an integral part of a complete examination. Limited examinations for reoccurring indications may be performed as noted. The reflux portion of the exam is performed with the patient in reverse Trendelenburg.  +---------+---------------+---------+-----------+----------+--------------+ RIGHT    CompressibilityPhasicitySpontaneityPropertiesThrombus Aging +---------+---------------+---------+-----------+----------+--------------+ CFV      Full           Yes      Yes                                 +---------+---------------+---------+-----------+----------+--------------+ SFJ      Full                                                        +---------+---------------+---------+-----------+----------+--------------+ FV Prox  Full           Yes      Yes                                 +---------+---------------+---------+-----------+----------+--------------+  FV Mid   Full           Yes      Yes                                 +---------+---------------+---------+-----------+----------+--------------+ FV DistalFull           Yes      Yes                                 +---------+---------------+---------+-----------+----------+--------------+ PFV      Full                                                        +---------+---------------+---------+-----------+----------+--------------+ POP      Full           Yes      Yes                                 +---------+---------------+---------+-----------+----------+--------------+ PTV      None           No       No                   Acute          +---------+---------------+---------+-----------+----------+--------------+ PERO     None           No       No                   Acute           +---------+---------------+---------+-----------+----------+--------------+   +---------+---------------+---------+-----------+----------+--------------+ LEFT     CompressibilityPhasicitySpontaneityPropertiesThrombus Aging +---------+---------------+---------+-----------+----------+--------------+ CFV      Full           Yes      Yes                                 +---------+---------------+---------+-----------+----------+--------------+ SFJ      Full                                                        +---------+---------------+---------+-----------+----------+--------------+ FV Prox  Full           Yes      Yes                                 +---------+---------------+---------+-----------+----------+--------------+ FV Mid   Full           Yes      Yes                                 +---------+---------------+---------+-----------+----------+--------------+ FV DistalFull           Yes      Yes                                 +---------+---------------+---------+-----------+----------+--------------+  PFV      Full                                                        +---------+---------------+---------+-----------+----------+--------------+ POP      Full           Yes      Yes                                 +---------+---------------+---------+-----------+----------+--------------+ PTV      Full                                                        +---------+---------------+---------+-----------+----------+--------------+ PERO     None           No       No                   Acute          +---------+---------------+---------+-----------+----------+--------------+     Summary: BILATERAL: -No evidence of popliteal cyst, bilaterally. RIGHT: - Findings consistent with acute deep vein thrombosis involving the right peroneal veins, and right posterior tibial veins.  LEFT: - Findings consistent with acute deep vein thrombosis involving the  left peroneal veins.  *See table(s) above for measurements and observations. Electronically signed by Coral Else MD on 09/25/2022 at 5:07:37 PM.    Final    IR IVC FILTER PLMT / S&I Lenise Arena GUID/MOD SED  Result Date: 09/25/2022 CLINICAL DATA:  67 year old male with history of diffuse large B-cell lymphoma and lower extremity deep vein thrombosis with contraindication to anticoagulation due to high risk of lymphoma hemorrhagic transformation. EXAM: 1. ULTRASOUND GUIDANCE FOR VASCULAR ACCESS OF THE RIGHT internal jugular VEIN. 2. IVC VENOGRAM. 3. PERCUTANEOUS IVC FILTER PLACEMENT. ANESTHESIA/SEDATION: Moderate (conscious) sedation was employed during this procedure. A total of Versed 1 mg and Fentanyl 50 mcg was administered intravenously. Moderate Sedation Time: 12 minutes. The patient's level of consciousness and vital signs were monitored continuously by radiology nursing throughout the procedure under my direct supervision. CONTRAST:  30mL OMNIPAQUE IOHEXOL 300 MG/ML SOLN, 7mL OMNIPAQUE IOHEXOL 300 MG/ML SOLN FLUOROSCOPY TIME:  One hundred five mGy. PROCEDURE: The procedure, risks, benefits, and alternatives were explained to the patient. Questions regarding the procedure were encouraged and answered. The patient understands and consents to the procedure. The patient was prepped with Betadine in a sterile fashion, and a sterile drape was applied covering the operative field. A sterile gown and sterile gloves were used for the procedure. Local anesthesia was provided with 1% Lidocaine. Under direct ultrasound guidance, a 21 gauge needle was advanced into the right internal jugular vein with ultrasound image documentation performed. After securing access with a micropuncture dilator, a guidewire was advanced into the inferior vena cava. A deployment sheath was advanced over the guidewire. This was utilized to perform IVC venography. The deployment sheath was further positioned in an appropriate location for  filter deployment. A Denali IVC filter was then advanced in the sheath. This was then fully deployed in the infrarenal IVC. Final filter position was confirmed with a  fluoroscopic spot image. Contrast injection was also performed through the sheath under fluoroscopy to confirm patency of the IVC at the level of the filter. After the procedure the sheath was removed and hemostasis obtained with manual compression. COMPLICATIONS: None. FINDINGS: IVC venography demonstrates a normal caliber IVC with no evidence of thrombus. Renal veins are identified bilaterally. The IVC filter was successfully positioned below the level of the renal veins and is appropriately oriented. This IVC filter has both permanent and retrievable indications. IMPRESSION: Placement of percutaneous IVC filter in infrarenal IVC. IVC venogram shows no evidence of IVC thrombus and normal caliber of the inferior vena cava. This filter does have both permanent and retrievable indications. PLAN: This IVC filter is potentially retrievable. The patient will be assessed for filter retrieval by Interventional Radiology in approximately 8-12 weeks. Further recommendations regarding filter retrieval, continued surveillance or declaration of device permanence, will be made at that time. Marliss Coots, MD [facility] Electronically Signed   By: Marliss Coots M.D.   On: 09/25/2022 15:20   ECHOCARDIOGRAM LIMITED  Result Date: 09/25/2022    ECHOCARDIOGRAM LIMITED REPORT   Patient Name:   Justin Cameron Date of Exam: 09/25/2022 Medical Rec #:  [id number]           Height:       68.0 in Accession #:    [id number]          Weight:       178.8 lb Date of Birth:  1955-06-09            BSA:          1.949 m Patient Age:    66 years            BP:           94/56 mmHg Patient Gender: M                   HR:           100 bpm. Exam Location:  Inpatient Procedure: Limited Echo, Cardiac Doppler, Color Doppler  and Intracardiac            Opacification Agent Indications:    Pulmonary embolis  History:        Patient has prior history of Echocardiogram examinations, most                 recent 09/20/2022.  Sonographer:    Lucy Antigua Referring Phys: [id number] Adam Phenix IMPRESSIONS  1. Left ventricular ejection fraction, by estimation, is 45 to 50%. The left ventricle has mildly decreased function. There is the interventricular septum is flattened in systole, consistent with right ventricular pressure overload.  2. Right ventricular systolic function is mildly reduced. The right ventricular size is mildly enlarged.  3. Mild mitral valve regurgitation.  4. The aortic valve is tricuspid. Aortic valve sclerosis is present, with no evidence of aortic valve stenosis. Comparison(s): Prior images reviewed side by side. Compared to recent prior study, LVEF is mildly decreased, with evidence of new RV strain. This study uses echo contrast. FINDINGS  Left Ventricle: Left ventricular ejection fraction, by estimation, is 45 to 50%. The left ventricle has mildly decreased function. Definity contrast agent was given IV to delineate the left ventricular endocardial borders. The interventricular septum is  flattened in systole, consistent with right ventricular pressure overload. Right Ventricle: The right ventricular size is mildly enlarged. Right ventricular systolic function is mildly reduced. Mitral Valve:  Mild mitral valve regurgitation. Tricuspid Valve: Tricuspid valve regurgitation is mild. Aortic Valve: The aortic valve is tricuspid. Aortic valve sclerosis is present, with no evidence of aortic valve stenosis. Pulmonic Valve: The pulmonic valve was normal in structure. Pulmonic valve regurgitation is trivial. No evidence of pulmonic stenosis. Additional Comments: Spectral Doppler performed. Color Doppler performed.   LV Volumes (MOD) LV vol d, MOD A2C: 77.5 ml LV vol d, MOD A4C: 70.7 ml LV vol s, MOD A2C: 44.8 ml LV vol s,  MOD A4C: 37.7 ml LV SV MOD A2C:     32.7 ml LV SV MOD A4C:     70.7 ml LV SV MOD BP:      35.1 ml Riley Lam MD Electronically signed by Riley Lam MD Signature Date/Time: 09/25/2022/1:37:02 PM    Final     Anti-infectives: Anti-infectives (From admission, onward)    Start     Dose/Rate Route Frequency Ordered Stop   09/20/22 1402  ceFAZolin (ANCEF) IVPB 2g/100 mL premix        over 30 Minutes Intravenous Continuous PRN 09/20/22 1411 09/20/22 1402   09/20/22 0600  piperacillin-tazobactam (ZOSYN) IVPB 3.375 g        3.375 g 12.5 mL/hr over 240 Minutes Intravenous Every 8 hours 09/19/22 1943     09/19/22 1800  amoxicillin-clavulanate (AUGMENTIN) 875-125 MG per tablet 1 tablet  Status:  Discontinued        1 tablet Oral Every 12 hours 09/19/22 1659 09/19/22 1939       Assessment/Plan: s/p Procedure(s): EXPLORATORY LAPAROTOMY, BOWEL RESECTION, ILEOSTOMY (N/A)   B-cell lymphoma-received 1 round of chemotherapy and subsequent colonic perforation.  Once healthy, he may resume chemotherapy but would not recommend any treatment for now until the recovers from surgery  TNA  Allow clears today  Okay to participate with physical therapy once physically able.  ICU team/primary care team decide on when patient can go from ICU.  He is stable from a surgical standpoint.  ABL anemia-multifactorial-hemoglobin is hovering around 7.  Okay to transfuse but will defer that to hematology and primary service.  A-fib-treated overnight.  Managed by primary team.  Heart rate in the 100 today.  LOS: 8 days    Dortha Schwalbe MD 09/27/2022

## 2022-09-27 NOTE — Progress Notes (Signed)
Asked Pts wife about cpap, decided he probably would not tolerate ou equipment tonight. She wants to bring his machine in [place] and have [place] try him on that.

## 2022-09-27 NOTE — Progress Notes (Signed)
PHARMACY - TOTAL PARENTERAL NUTRITION CONSULT NOTE   Indication: Prolonged ileus  Patient Measurements: Height: 5\' 8"  (172.7 cm) Weight: 86.8 kg (191 lb 5.8 oz) IBW/kg (Calculated) : 68.4 TPN AdjBW (KG): 81 Body mass index is 29.1 kg/m. 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  He was supposed to start chemo, but developed severe acute abdominal pain on 5/28.  NG tube placed, imaging to confirm placement shows possible free air and he was taken to OR on 5/28.  Pharmacy is consulted to dose TPN for expected prolonged ileus.    Glucose / Insulin: No hx DM, no PTA medications.   - CBG range: 88-111. Goal: 100-180. No SSI required in 24 hrs. - Solumedrol 250mg  5/23 and 5/24, solumedrol 125mg  5/25 and 5/26, dexamethasone 10mg  5/28 Electrolytes: K (3.4), Phos (2) low. All other lytes WNL, including CorrCa (9.7). Ca trending up towards upper end of normal. Renal: SCr improved (baseline ~0.7), BUN WNL Hepatic: AST/ALT, Tbili WNL. TG trending down off of propofol Intake / Output: +850 mL -mIVF: LR @ 40 mL/hr -Output: Drain 90 mL, Urine 1300 mL. NGT removed GI Imaging:  -5/28  Lucency in the right upper quadrant was not seen on the previous images. Free intraperitoneal air is not excluded.  GI Surgeries / Procedures:  -5/28 Exploratory laparotomy with right hemicolectomy and end ileostomy.  -5/29 IVC filter placed  Central access: Port (09/20/22), Triple lumen PICC placed 5/29 TPN start date: 5/29  Nutritional Goals: Goal TPN rate of 100 mL/hr provides 127 g of protein and 2453 kcals per day  RD Assessment: Estimated Needs Total Energy Estimated Needs: 2400-2600 kcals Total Protein Estimated Needs: 120-130 gm Total Fluid Estimated Needs: >/= 2.4 L  Current Nutrition:  Diet advanced to clear liquid  Plan:  Now: Potassium  phosphate 20 mmol IV once (provides ~30 mEq KCl) KCl 10 mEq IV x 2 runs  At 1800: Continue TPN at 60 mL/hr Will provide 76 g protein, 1472 kcal Will hold off on advancing rate with hypophosphatemia Electrolytes in TPN: Increase Na. Increase K, Phos back to standard concentration. Remove Ca. Na 100 mEq/L, K 50 mEq/L, Ca 0 mEq/L, Mg 3 mEq/L, and Phos 15 mmol/L.  Cl:Ac 1:1 Add standard MVI and trace elements to TPN Thiamine 100 mg IV daily x5 days through 6/3 Continue sensitive SSI q6h and adjust as needed  Continue MIVF at 40 mL/hr.  Further adjustment per MD. Monitor TPN labs on Mon/Thurs. Recheck electrolytes with AM labs tomorrow Monitor electrolytes closely if chemotherapy initiated as pt will be at high risk for TLS  Cindi Carbon, PharmD 09/27/22 9:19 AM

## 2022-09-27 NOTE — Progress Notes (Signed)
   09/27/22 2227  BiPAP/CPAP/SIPAP  Reason BIPAP/CPAP not in use Other(comment) (RN took patient off of CPAP. Patient unable to tolerate per RN. RN placed patient back on 6L [place])

## 2022-09-28 DIAGNOSIS — C8330 Diffuse large B-cell lymphoma, unspecified site: Secondary | ICD-10-CM | POA: Diagnosis not present

## 2022-09-28 LAB — BASIC METABOLIC PANEL
Anion gap: 10 (ref 5–15)
Anion gap: 8 (ref 5–15)
BUN: 18 mg/dL (ref 8–23)
BUN: 17 mg/dL (ref 8–23)
CO2: 24 mmol/L (ref 22–32)
CO2: 24 mmol/L (ref 22–32)
Calcium: 7.8 mg/dL — ABNORMAL LOW (ref 8.9–10.3)
Calcium: 7.3 mg/dL — ABNORMAL LOW (ref 8.9–10.3)
Chloride: 103 mmol/L (ref 98–111)
Chloride: 103 mmol/L (ref 98–111)
Creatinine, Ser: 0.78 mg/dL (ref 0.61–1.24)
Creatinine, Ser: 0.69 mg/dL (ref 0.61–1.24)
GFR, Estimated: >60 mL/min (ref >60)
GFR, Estimated: >60 mL/min (ref >60)
Glucose, Bld: 105 mg/dL — ABNORMAL HIGH (ref 70–99)
Glucose, Bld: 211 mg/dL — ABNORMAL HIGH (ref 70–99)
Potassium: 3.3 mmol/L — ABNORMAL LOW (ref 3.5–5.1)
Potassium: 3.9 mmol/L (ref 3.5–5.1)
Sodium: 137 mmol/L (ref 135–145)
Sodium: 135 mmol/L (ref 135–145)

## 2022-09-28 LAB — PHOSPHORUS: Phosphorus: 3.2 mg/dL (ref 2.5–4.6)

## 2022-09-28 LAB — POTASSIUM: Potassium: 3.9 mmol/L (ref 3.5–5.1)

## 2022-09-28 LAB — HEMOGLOBIN AND HEMATOCRIT, BLOOD
HCT: 24.3 % — ABNORMAL LOW (ref 39.0–52.0)
Hemoglobin: 7.6 g/dL — ABNORMAL LOW (ref 13.0–17.0)

## 2022-09-28 LAB — GLUCOSE, CAPILLARY
Glucose-Capillary: 108 mg/dL — ABNORMAL HIGH (ref 70–99)
Glucose-Capillary: 104 mg/dL — ABNORMAL HIGH (ref 70–99)
Glucose-Capillary: 108 mg/dL — ABNORMAL HIGH (ref 70–99)

## 2022-09-28 LAB — ALBUMIN: Albumin: 2.1 g/dL — ABNORMAL LOW (ref 3.5–5.0)

## 2022-09-28 LAB — MAGNESIUM: Magnesium: 1.9 mg/dL (ref 1.7–2.4)

## 2022-09-28 MED ORDER — CHLORHEXIDINE GLUCONATE CLOTH 2 % EX PADS
6.0000 | MEDICATED_PAD | Freq: Every day | CUTANEOUS | Status: DC
  Administered 2022-09-29 – 2022-10-11 (×14): 6 via TOPICAL

## 2022-09-28 MED ORDER — HYDROMORPHONE HCL 1 MG/ML IJ SOLN
1.0000 mg | Freq: Once | INTRAMUSCULAR | Status: AC
  Administered 2022-09-28: 1 mg via INTRAVENOUS
  Filled 2022-09-28: qty 1

## 2022-09-28 MED ORDER — ACETAMINOPHEN 10 MG/ML IV SOLN
1000.0000 mg | Freq: Four times a day (QID) | INTRAVENOUS | Status: DC
  Administered 2022-09-28: 1000 mg via INTRAVENOUS
  Filled 2022-09-28: qty 100

## 2022-09-28 MED ORDER — ACETAMINOPHEN 650 MG RE SUPP
RECTAL | Status: AC
  Administered 2022-09-28: 650 mg via RECTAL
  Filled 2022-09-28: qty 1

## 2022-09-28 MED ORDER — TRAVASOL 10 % IV SOLN
INTRAVENOUS | Status: AC
  Filled 2022-09-28: qty 763.2

## 2022-09-28 MED ORDER — POTASSIUM CHLORIDE 10 MEQ/50ML IV SOLN
10.0000 meq | INTRAVENOUS | Status: AC
  Administered 2022-09-28 (×4): 10 meq via INTRAVENOUS
  Filled 2022-09-28 (×4): qty 50

## 2022-09-28 MED ORDER — ACETAMINOPHEN 650 MG RE SUPP
650.0000 mg | RECTAL | Status: DC | PRN
  Filled 2022-09-28: qty 1

## 2022-09-28 MED ORDER — METOCLOPRAMIDE HCL 5 MG/ML IJ SOLN
10.0000 mg | Freq: Three times a day (TID) | INTRAMUSCULAR | Status: DC | PRN
  Administered 2022-09-28 – 2022-09-30 (×4): 10 mg via INTRAVENOUS
  Filled 2022-09-28 (×4): qty 2

## 2022-09-28 MED ORDER — POTASSIUM CHLORIDE 10 MEQ/50ML IV SOLN: 10.0000 meq | INTRAVENOUS | Status: DC

## 2022-09-28 MED ORDER — POTASSIUM CHLORIDE 10 MEQ/50ML IV SOLN
10.0000 meq | INTRAVENOUS | Status: AC
  Administered 2022-09-28 (×2): 10 meq via INTRAVENOUS
  Filled 2022-09-28 (×2): qty 50

## 2022-09-28 MED ORDER — MAGNESIUM SULFATE IN D5W 1-5 GM/100ML-% IV SOLN
1.0000 g | Freq: Once | INTRAVENOUS | Status: AC
  Administered 2022-09-28: 1 g via INTRAVENOUS
  Filled 2022-09-28: qty 100

## 2022-09-28 NOTE — Progress Notes (Signed)
4 Days Post-Op   Subjective/Chief Complaint: Patient is somnolent.  On Geophysical data processor.  No distress.  He does have hiccups.  Pain controlled.   Objective: Vital signs in last 24 hours: Temp:  [97.5 F (36.4 C)-101.9 F (38.8 C)] 100.7 F (38.2 C) (06/01 0800) Pulse Rate:  [93-112] 97 (06/01 0500) Resp:  [15-30] 22 (06/01 0500) BP: (97-142)/(37-64) 142/49 (06/01 0500) SpO2:  [91 %-100 %] 99 % (06/01 0500) Weight:  [91.3 kg] 91.3 kg (06/01 0439) Last BM Date : 09/23/22  Intake/Output from previous day: 05/31 0701 - 06/01 0700 In: 2094.3 [P.O.:120; I.V.:1765.7; IV Piggyback:208.6] Out: 1800 [Urine:1750; Drains:50] Intake/Output this shift: No intake/output data recorded.  Abdomen: Dressing removed from midline wound.  Good granulation tissue.  2 darker areas.  No drainage.  No foul smell that I can detect this morning.  Ileostomy is functioning with contents in the bag.  JP drain serosanguineous.  Lab Results:  Recent Labs    09/26/22 0826 09/26/22 1309  WBC 14.2* 10.4  HGB 6.9* 7.2*  HCT 22.5* 22.6*  PLT 274 199   BMET Recent Labs    09/27/22 0758 09/28/22 0430  NA 138 137  K 3.4* 3.3*  CL 106 103  CO2 24 24  GLUCOSE 110* 105*  BUN 23 18  CREATININE 0.79 0.78  CALCIUM 8.1* 7.8*   PT/INR No results for input(s): "LABPROT", "INR" in the last 72 hours. ABG No results for input(s): "PHART", "HCO3" in the last 72 hours.  Invalid input(s): "PCO2", "PO2"  Studies/Results: No results found.  Anti-infectives: Anti-infectives (From admission, onward)    Start     Dose/Rate Route Frequency Ordered Stop   09/20/22 1402  ceFAZolin (ANCEF) IVPB 2g/100 mL premix        over 30 Minutes Intravenous Continuous PRN 09/20/22 1411 09/20/22 1402   09/20/22 0600  piperacillin-tazobactam (ZOSYN) IVPB 3.375 g        3.375 g 12.5 mL/hr over 240 Minutes Intravenous Every 8 hours 09/19/22 1943     09/19/22 1800  amoxicillin-clavulanate (AUGMENTIN) 875-125 MG per tablet 1  tablet  Status:  Discontinued        1 tablet Oral Every 12 hours 09/19/22 1659 09/19/22 1939       Assessment/Plan: s/p Procedure(s): EXPLORATORY LAPAROTOMY, BOWEL RESECTION, ILEOSTOMY (N/A)   Continue TNA  He does have hiccups and may try Haldol and see if that helps.  Hemoglobin stable  High risk of intra-abdominal abscess.  Plan to CT scan Monday  A-fib: Rate controlled at this point  B-cell lymphoma-Per oncology  Continue antibiotics  PT/OT as able  Leave on clear liquids for now  LOS: 9 days    Dortha Schwalbe MD  09/28/2022

## 2022-09-28 NOTE — Progress Notes (Signed)
PROGRESS NOTE    Justin Cameron  WUJ:[id number] DOB: 05-Aug-1955 DOA: 09/19/2022 PCP: Daisy Floro, MD   Brief Narrative:  Justin Cameron is a 67 y.o. male with medical history significant for OSA, newly diagnosed diffuse large B-cell lymphoma who initially presented to [facility] medical oncology clinic with complaints of intermittent and progressive abdominal discomfort, generalized fatigue, night sweats, poor oral intake, unintentional weight loss of 30 pounds since February 2024.   CT scan of the abdomen and pelvis revealed the following findings: Enlarging mass at the hepatic flexure increased pericolonic fat stranding with loss of fat plane between the mass in the right lobe of the liver.  Increasing mesenteric lymphadenopathy and new soft tissue mass along the crus of the left diaphragm and enlarging bilateral adrenal masses consistent with progressive intra-abdominal metastases.  New and enlarging left lower lobe pulmonary nodules consistent with progressive metastatic pulmonary involvement.   09/24/22 Exploratory laparotomy with right hemicolectomy and end ileostomy, JP drain placement  Assessment & Plan:   Principal Problem:   Large cell (diffuse) non-Hodgkin's lymphoma (HCC) Active Problems:   Cancer related pain   Encounter for antineoplastic chemotherapy   Protein-calorie malnutrition, severe   Other partial intestinal obstruction (HCC)   Right upper quadrant abdominal pain    Partial lower bowel obstruction from diffuse large Bcell lymphoma mass with perforation s/p ex lap, diverting ostomy At risk for tumor lysis syndrome - 09/24/22 Exploratory laparotomy with right hemicolectomy and end ileostomy, JP drain placement - Appreciate surgical insight and recommendations -Appreciate oncology recommendations, continue allopurinol -TPN ongoing -Plan for CT abdomen Monday, high risk for abscess -Zosyn completed 5/31 -Patient continues to have nightly fevers,  reported to have night sweats by wife, likely in the setting of lymphoma however given recent surgery low threshold to repeat cultures or imaging pending clinical status -Will need to be back on chemotherapy at some point but no treatment recommended until after he fully recovers from surgery   Acute hypoxemic respiratory failure present pre-op- derecruitment + suspected pulmonary VTE BL LE DVT -Extubated to simple mask on the 29th, sats appear to be stable -Patient high risk for anticoagulation due to risk of hemorrhage given lymphoma, IV filter in place -Could consider anticoagulation challenge once patient's clinical status improves and stabilizes.  Acute metabolic encephalopathy, multifactorial  -Complicated by anesthesia, hypoxia, hospital delirium -Poorly arousable this morning  Postop acute symptomatic blood loss anemia -Previously stable, follow repeat labs  Postop shock- related to blood loss and fluid shifts, resolved -Likely hypovolemic in nature complicated by blood loss  New onset atrial fibrillation, currently rate controlled.  Suspect provoked due to postop stressors, no known history -Likely provoked in the setting of above, rate generally controlled but does have episodes of tachycardia into the 150s and 160s that appear to be asymptomatic. -Patient continues on amiodarone drip - may be able to discontinue in the next 24 to 48 hours if he converts back to NSR since this is not a chronic problem  History of sleep apnea, not on home CPAP -Could initiate nocturnal CPAP if he is willing to wear it.  DVT prophylaxis: SCDs, no chemical DVT prophylaxis in the setting of high risk for hemorrhagic conversion as above Code Status: Full Family Communication: Wife at bedside  Status is: Inpatient  Dispo: The patient is from: Home              Anticipated d/c is to: To be determined  Anticipated d/c date is: To be determined >72h              Patient currently not  medically stable for discharge  Consultants:  PCCM, general surgery, oncology, radiology  Procedures:  Port catheter placed 5/24 Exploratory laparotomy with diverting ostomy 5/28  Antimicrobials:  Completed Zosyn 5/31  Subjective: No acute issues or events overnight, continues to have fevers with transient episodes of A-fib with RVR.  Patient is somnolent this morning and review of systems are quite limited by patient's mental status.  Wife indicates that he appears comfortable this morning " better than before"  Objective: Vitals:   09/28/22 0357 09/28/22 0400 09/28/22 0439 09/28/22 0500  BP:  (!) 106/52  (!) 142/49  Pulse:  100  97  Resp:  (!) 21  (!) 22  Temp: 99.8 F (37.7 C)     TempSrc: Axillary     SpO2:  100%  99%  Weight:   91.3 kg   Height:        Intake/Output Summary (Last 24 hours) at 09/28/2022 0706 Last data filed at 09/28/2022 1610 Gross per 24 hour  Intake 2094.34 ml  Output 1800 ml  Net 294.34 ml   Filed Weights   09/26/22 0429 09/27/22 0500 09/28/22 0439  Weight: 86.7 kg 86.8 kg 91.3 kg    Examination:  General:  Pleasantly resting in bed, No acute distress.  Lethargic, minimally responsive HEENT: Tolerating simple mask. Neck:  Without mass or deformity.  Trachea is midline. Lungs: Diminished bilaterally without overt wheezes rales or rhonchi. Heart: Irregularly irregular heart rate 1 40-1 60 during exam.  Without overt murmurs, rubs, or gallops. Abdomen: Bandage clean dry intact, JP drain intact Extremities: Warm, without edema or obvious deformity.   Data Reviewed: I have personally reviewed following labs and imaging studies  CBC: Recent Labs  Lab 09/22/22 0554 09/23/22 0515 09/24/22 0127 09/24/22 1825 09/25/22 0439 09/26/22 0008 09/26/22 0514 09/26/22 0826 09/26/22 1309  WBC 12.2* 12.6* 19.9*   < > 36.9* 10.6* 15.2* 14.2* 10.4  NEUTROABS 10.9* 11.0* 17.8*  --  33.7*  --  13.7*  --   --   HGB 10.3* 10.2* 12.1*   < > 11.5* 7.7* 7.1*  6.9* 7.2*  HCT 33.5* 33.2* 39.0   < > 36.9* 24.6* 23.4* 22.5* 22.6*  MCV 81.5 81.0 80.9   < > 82.2 82.8 82.4 81.8 82.2  PLT 307 306 490*   < > 439* 183 328 274 199   < > = values in this interval not displayed.   Basic Metabolic Panel: Recent Labs  Lab 09/24/22 1335 09/24/22 1825 09/25/22 0439 09/26/22 0407 09/26/22 1841 09/27/22 0758 09/28/22 0430  NA 140   < > 139 137 140 138 137  K 4.1   < > 4.2 3.8 3.5 3.4* 3.3*  CL 103   < > 107 106 107 106 103  CO2 24   < > 22 24 25 24 24   GLUCOSE 112*   < > 148* 122* 111* 110* 105*  BUN 28*   < > 29* 31* 23 23 18   CREATININE 1.27*   < > 1.34* 0.96 0.85 0.79 0.78  CALCIUM 8.4*   < > 7.8* 7.4* 7.9* 8.1* 7.8*  MG 2.3  --  2.6* 2.4 2.3 2.3 1.9  PHOS 5.4*  --  5.9* 2.6  --  2.0* 3.2   < > = values in this interval not displayed.   GFR: Estimated Creatinine Clearance: 101 mL/min (  by C-G formula based on SCr of 0.78 mg/dL). Liver Function Tests: Recent Labs  Lab 09/22/22 0554 09/23/22 0515 09/24/22 0127 09/25/22 0439 09/26/22 0407  AST 25 29 33 29 26  ALT 13 17 23 18 17   ALKPHOS 54 52 69 44 47  BILITOT 0.6 0.6 0.5 1.1 0.9  PROT 6.0* 5.9* 6.6 5.1* 4.6*  ALBUMIN 2.8* 2.5* 3.1* 2.6* 2.0*   No results for input(s): "LIPASE", "AMYLASE" in the last 168 hours. No results for input(s): "AMMONIA" in the last 168 hours. Coagulation Profile: No results for input(s): "INR", "PROTIME" in the last 168 hours. Cardiac Enzymes: No results for input(s): "CKTOTAL", "CKMB", "CKMBINDEX", "TROPONINI" in the last 168 hours. BNP (last 3 results) No results for input(s): "PROBNP" in the last 8760 hours. HbA1C: No results for input(s): "HGBA1C" in the last 72 hours. CBG: Recent Labs  Lab 09/27/22 0342 09/27/22 1159 09/27/22 1734 09/27/22 2329 09/28/22 0700  GLUCAP 88 122* 101* 108* 108*   Lipid Profile: Recent Labs    09/27/22 0758  TRIG 204*   Thyroid Function Tests: No results for input(s): "TSH", "T4TOTAL", "FREET4", "T3FREE",  "THYROIDAB" in the last 72 hours. Anemia Panel: No results for input(s): "VITAMINB12", "FOLATE", "FERRITIN", "TIBC", "IRON", "RETICCTPCT" in the last 72 hours. Sepsis Labs: Recent Labs  Lab 09/24/22 1240 09/24/22 1825 09/25/22 0439 09/26/22 0008  LATICACIDVEN 3.8* 3.7* 3.7* 2.1*    Recent Results (from the past 240 hour(s))  Blood culture (routine x 2)     Status: None   Collection Time: 09/18/22  5:15 PM   Specimen: BLOOD  Result Value Ref Range Status   Specimen Description   Final    BLOOD SITE NOT SPECIFIED Performed at [facility], [address] [place], [place] [address]    Special Requests   Final    BOTTLES DRAWN AEROBIC AND ANAEROBIC Blood Culture adequate volume Performed at [facility], [address], [place], [place] [address]    Culture   Final    NO GROWTH 5 DAYS Performed at [facility], [address] [place], [place] [address]    Report Status 09/23/2022 FINAL  Final  Blood culture (routine x 2)     Status: None   Collection Time: 09/18/22  5:30 PM   Specimen: BLOOD  Result Value Ref Range Status   Specimen Description   Final    BLOOD SITE NOT SPECIFIED Performed at [facility], [address], [place], [place] [address]    Special Requests   Final    BOTTLES DRAWN AEROBIC AND ANAEROBIC Blood Culture results may not be optimal due to an excessive volume of blood received in culture bottles Performed at [facility], [address] [place], [place] [address]    Culture   Final    NO GROWTH 5 DAYS Performed at [facility], [address] [place], [place] [address]    Report Status 09/23/2022 FINAL  Final  MRSA Next Gen by PCR, Nasal     Status: None   Collection Time: 09/24/22 11:05 AM   Specimen: Nasal Mucosa; Nasal Swab  Result Value Ref Range Status   MRSA by PCR Next Gen NOT DETECTED NOT DETECTED Final    Comment: (NOTE) The GeneXpert MRSA Assay  (FDA approved for NASAL specimens only), is one component of a comprehensive MRSA colonization surveillance program. It is not intended to diagnose MRSA infection nor to guide or monitor treatment for MRSA  infections. Test performance is not FDA approved in patients less than 32 years old. Performed at [facility], [address], [place], [place] [address]          Radiology Studies: No results found.  Scheduled Meds:  sodium chloride   Intravenous Once   Chlorhexidine Gluconate Cloth  6 each Topical Daily   insulin aspart  0-9 Units Subcutaneous Q6H   pantoprazole (PROTONIX) IV  40 mg Intravenous Q24H   sodium chloride flush  10-40 mL Intracatheter Q12H   thiamine (VITAMIN B1) injection  100 mg Intravenous Daily   Continuous Infusions:  amiodarone 30 mg/hr (09/28/22 0504)   chlorproMAZINE (THORAZINE) 12.5 mg in sodium chloride 0.9 % 25 mL IVPB Stopped (09/27/22 2226)   lactated ringers 40 mL/hr at 09/28/22 0049   magnesium sulfate bolus IVPB 1 g (09/28/22 0653)   phenylephrine (NEO-SYNEPHRINE) Adult infusion Stopped (09/26/22 1404)   piperacillin-tazobactam (ZOSYN)  IV 3.375 g (09/28/22 0865)   potassium chloride     TPN ADULT (ION) 60 mL/hr at 09/28/22 0049     LOS: 9 days   Time spent:  Azucena Fallen, DO [facility]  If 7PM-7AM, please contact night-coverage [email or web address]  09/28/2022, 7:06 AM

## 2022-09-28 NOTE — Progress Notes (Signed)
eLink Physician-Brief Progress Note Patient Name: Justin Cameron DOB: 03-22-1956 MRN: [id number]   Date of Service  09/28/2022  HPI/Events of Note  K 3.3, Mg 1.9, Cr 0.78  eICU Interventions  Electrolyte replacement protocol ordered     Intervention Category Intermediate Interventions: Electrolyte abnormality - evaluation and management  Ranee Gosselin 09/28/2022, 6:17 AM

## 2022-09-28 NOTE — Progress Notes (Signed)
PHARMACY - TOTAL PARENTERAL NUTRITION CONSULT NOTE   Indication: Prolonged ileus  Patient Measurements: Height: 5\' 8"  (172.7 cm) Weight: 91.3 kg (201 lb 4.5 oz) IBW/kg (Calculated) : 68.4 TPN AdjBW (KG): 81 Body mass index is 30.6 kg/m. 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  He was supposed to start chemo, but developed severe acute abdominal pain on 5/28.  NG tube placed, imaging to confirm placement shows possible free air and he was taken to OR on 5/28.  Pharmacy consulted to dose TPN for prolonged ileus.    Glucose / Insulin: No hx DM, no PTA medications.   - CBG range: 88-122. Goal: 100-180. 1 units of SSI required in 24 hrs. - Solumedrol 250mg  5/23 and 5/24, solumedrol 125mg  5/25 and 5/26, dexamethasone 10mg  5/28 Electrolytes: K (3.3), magnesium (1.9) . All other lytes WNL, including CorrCa (9.4 using albumin 2.0 from 5/30). This morning, provider has ordered KCl 10 mEq q1h x 4 and magnesium 1 g IV along with potassium level at 1130 Renal: SCr improved (baseline ~0.7), BUN WNL Hepatic: AST/ALT, Tbili WNL. TG trending down off of propofol Intake / Output: +294 mL -mIVF: LR @ 40 mL/hr -Output: Drain 50 mL, Urine 1750 mL. NGT removed GI Imaging:  -5/28  Lucency in the right upper quadrant was not seen on the previous images. Free intraperitoneal air is not excluded.  GI Surgeries / Procedures:  -5/28 Exploratory laparotomy with right hemicolectomy and end ileostomy.  -5/29 IVC filter placed  Central access: Port (09/20/22), Triple lumen PICC placed 5/29 TPN start date: 5/29  Nutritional Goals: Goal TPN rate of 100 mL/hr provides 127 g of protein and 2453 kcals per day  RD Assessment: Estimated Needs Total Energy Estimated Needs: 2400-2600 kcals Total Protein Estimated Needs: 120-130 gm Total Fluid Estimated Needs:  >/= 2.4 L  Current Nutrition:  Diet advanced to clear liquid  Plan:  Now: Change potassium level to 1300 and replete potassium if less than 3.8  At 1800: Continue TPN at 60 mL/hr Will provide 76 g protein, 1472 kcal Will hold off on advancing rate with hypokalemia & hypomagnesemia; recent hypophosphatemia (resolved after supplementation yesterday) Electrolytes in TPN: Increase K, Increase Mg Na 100 mEq/L, K 60 mEq/L, Ca 0 mEq/L, Mg 5 mEq/L, and Phos 15 mmol/L.  Cl:Ac 1:1 Add standard MVI and trace elements to TPN Thiamine 100 mg IV daily x5 days through 6/3 Continue sensitive SSI q6h and adjust as needed  Continue MIVF at 40 mL/hr.  Further adjustment per MD. Monitor TPN labs on Mon/Thurs. Recheck electrolytes & albumin with AM labs tomorrow Monitor electrolytes closely if chemotherapy initiated as pt will be at high risk for TLS   Thank you for allowing pharmacy to be a part of this patient's care.  Selinda Eon, PharmD, BCPS Clinical Pharmacist [place] Please utilize Amion for appropriate phone number to reach the unit pharmacist [facility]) 09/28/2022 9:15 AM

## 2022-09-28 NOTE — Progress Notes (Signed)
   09/28/22 2019  BiPAP/CPAP/SIPAP  Reason BIPAP/CPAP not in use Other(comment) (patient unable to tolerate)

## 2022-09-28 NOTE — Progress Notes (Signed)
   09/28/22 2231  BiPAP/CPAP/SIPAP  Reason BIPAP/CPAP not in use  (pt unable to tolerate)

## 2022-09-28 DEATH — deceased

## 2022-09-29 ENCOUNTER — Inpatient Hospital Stay (HOSPITAL_COMMUNITY): Payer: Medicare Other

## 2022-09-29 DIAGNOSIS — C8330 Diffuse large B-cell lymphoma, unspecified site: Secondary | ICD-10-CM | POA: Diagnosis not present

## 2022-09-29 LAB — CBC WITH DIFFERENTIAL/PLATELET
Abs Immature Granulocytes: 0.5 10*3/uL — ABNORMAL HIGH (ref 0.00–0.07)
Basophils Absolute: 0.1 10*3/uL (ref 0.0–0.1)
Basophils Relative: 0 %
Eosinophils Absolute: 0.3 10*3/uL (ref 0.0–0.5)
Eosinophils Relative: 2 %
HCT: 24.8 % — ABNORMAL LOW (ref 39.0–52.0)
Hemoglobin: 7.8 g/dL — ABNORMAL LOW (ref 13.0–17.0)
Immature Granulocytes: 3 %
Lymphocytes Relative: 2 %
Lymphs Abs: 0.5 10*3/uL — ABNORMAL LOW (ref 0.7–4.0)
MCH: 26.8 pg (ref 26.0–34.0)
MCHC: 31.5 g/dL (ref 30.0–36.0)
MCV: 85.2 fL (ref 80.0–100.0)
Monocytes Absolute: 1.2 10*3/uL — ABNORMAL HIGH (ref 0.1–1.0)
Monocytes Relative: 6 %
Neutro Abs: 17.9 10*3/uL — ABNORMAL HIGH (ref 1.7–7.7)
Neutrophils Relative %: 87 %
Platelets: 164 10*3/uL (ref 150–400)
RBC: 2.91 MIL/uL — ABNORMAL LOW (ref 4.22–5.81)
RDW: 18.6 % — ABNORMAL HIGH (ref 11.5–15.5)
WBC: 20.4 10*3/uL — ABNORMAL HIGH (ref 4.0–10.5)
nRBC: 0 % (ref 0.0–0.2)

## 2022-09-29 LAB — GLUCOSE, CAPILLARY
Glucose-Capillary: 116 mg/dL — ABNORMAL HIGH (ref 70–99)
Glucose-Capillary: 116 mg/dL — ABNORMAL HIGH (ref 70–99)
Glucose-Capillary: 124 mg/dL — ABNORMAL HIGH (ref 70–99)
Glucose-Capillary: 89 mg/dL (ref 70–99)
Glucose-Capillary: 99 mg/dL (ref 70–99)

## 2022-09-29 LAB — BASIC METABOLIC PANEL
Anion gap: 8 (ref 5–15)
Anion gap: 8 (ref 5–15)
BUN: 15 mg/dL (ref 8–23)
BUN: 17 mg/dL (ref 8–23)
CO2: 23 mmol/L (ref 22–32)
CO2: 24 mmol/L (ref 22–32)
Calcium: 7 mg/dL — ABNORMAL LOW (ref 8.9–10.3)
Calcium: 7.7 mg/dL — ABNORMAL LOW (ref 8.9–10.3)
Chloride: 101 mmol/L (ref 98–111)
Chloride: 105 mmol/L (ref 98–111)
Creatinine, Ser: 0.71 mg/dL (ref 0.61–1.24)
Creatinine, Ser: 0.64 mg/dL (ref 0.61–1.24)
GFR, Estimated: >60 mL/min (ref >60)
GFR, Estimated: >60 mL/min (ref >60)
Glucose, Bld: 889 mg/dL (ref 70–99)
Glucose, Bld: 105 mg/dL — ABNORMAL HIGH (ref 70–99)
Potassium: 7.4 mmol/L (ref 3.5–5.1)
Potassium: 3.6 mmol/L (ref 3.5–5.1)
Sodium: 132 mmol/L — ABNORMAL LOW (ref 135–145)
Sodium: 137 mmol/L (ref 135–145)

## 2022-09-29 LAB — PHOSPHORUS
Phosphorus: 6.3 mg/dL — ABNORMAL HIGH (ref 2.5–4.6)
Phosphorus: 2.9 mg/dL (ref 2.5–4.6)

## 2022-09-29 LAB — ALBUMIN: Albumin: 1.9 g/dL — ABNORMAL LOW (ref 3.5–5.0)

## 2022-09-29 LAB — MAGNESIUM
Magnesium: 2.2 mg/dL (ref 1.7–2.4)
Magnesium: 1.8 mg/dL (ref 1.7–2.4)

## 2022-09-29 MED ORDER — MAGNESIUM SULFATE 2 GM/50ML IV SOLN
2.0000 g | Freq: Once | INTRAVENOUS | Status: AC
  Administered 2022-09-29: 2 g via INTRAVENOUS
  Filled 2022-09-29: qty 50

## 2022-09-29 MED ORDER — ACETAMINOPHEN 160 MG/5ML PO SOLN
650.0000 mg | Freq: Four times a day (QID) | ORAL | Status: DC | PRN
  Administered 2022-09-30: 650 mg via ORAL
  Filled 2022-09-29 (×2): qty 20.3

## 2022-09-29 MED ORDER — POTASSIUM CHLORIDE 10 MEQ/50ML IV SOLN
10.0000 meq | INTRAVENOUS | Status: AC
  Administered 2022-09-29 (×4): 10 meq via INTRAVENOUS
  Filled 2022-09-29 (×4): qty 50

## 2022-09-29 MED ORDER — TRAVASOL 10 % IV SOLN
INTRAVENOUS | Status: AC
  Filled 2022-09-29: qty 1017.6

## 2022-09-29 MED ORDER — ACETAMINOPHEN 160 MG/5ML PO SOLN: 325.0000 mg | Freq: Four times a day (QID) | ORAL | Status: DC | PRN

## 2022-09-29 MED ORDER — IOHEXOL 300 MG/ML  SOLN
80.0000 mL | Freq: Once | INTRAMUSCULAR | Status: AC | PRN
  Administered 2022-09-29: 80 mL via INTRAVENOUS

## 2022-09-29 NOTE — Progress Notes (Signed)
PROGRESS NOTE    Justin Cameron  ZOX:[id number] DOB: [birth year] DOA: 09/19/2022 PCP: Justin Floro, MD   Brief Narrative:  Justin Cameron is a 67 y.o. male with medical history significant for OSA, newly diagnosed diffuse large B-cell lymphoma who initially presented to [facility] medical oncology clinic with complaints of intermittent and progressive abdominal discomfort, generalized fatigue, night sweats, poor oral intake, unintentional weight loss of 30 pounds since February 2024.   CT scan of the abdomen and pelvis revealed the following findings: Enlarging mass at the hepatic flexure increased pericolonic fat stranding with loss of fat plane between the mass in the right lobe of the liver.  Increasing mesenteric lymphadenopathy and new soft tissue mass along the crus of the left diaphragm and enlarging bilateral adrenal masses consistent with progressive intra-abdominal metastases.  New and enlarging left lower lobe pulmonary nodules consistent with progressive metastatic pulmonary involvement.   09/24/22 Exploratory laparotomy with right hemicolectomy and end ileostomy, JP drain placement  Assessment & Plan:   Principal Problem:   Large cell (diffuse) non-Hodgkin's lymphoma (HCC) Active Problems:   Cancer related pain   Encounter for antineoplastic chemotherapy   Protein-calorie malnutrition, severe   Other partial intestinal obstruction (HCC)   Right upper quadrant abdominal pain    Partial lower bowel obstruction from diffuse large Bcell lymphoma mass with perforation s/p ex lap, diverting ostomy At risk for tumor lysis syndrome - 09/24/22 Exploratory laparotomy with right hemicolectomy and end ileostomy, JP drain placement - Appreciate surgical insight and recommendations -Appreciate oncology recommendations, continue allopurinol -TPN ongoing -Plan for CT abdomen Monday but moved imaging to today given increasing WBC and ongoing fevers. -High risk for abscess  per surgery - Zosyn completed 5/31 -Patient continues to have nightly fevers, reported to have night sweats prior to hospitalization by wife, likely in the setting of lymphoma -Will need to be back on chemotherapy at some point but no treatment recommended until after he fully recovers from surgery   Acute hypoxemic respiratory failure present pre-op- derecruitment + suspected pulmonary VTE BL LE DVT -Extubated to simple mask on the 29th, sats appear to be stable -Patient high risk for anticoagulation due to risk of hemorrhage given lymphoma, IV filter in place -Could consider anticoagulation challenge once patient's clinical status improves and stabilizes and risk of bleeding diminishes.  Acute metabolic encephalopathy, multifactorial, resolving -Complicated by anesthesia, hypoxia, hospital delirium -Poorly arousable yesterday but more awake/alert this am - Aox4 - answering simple questions.  Postop acute symptomatic blood loss anemia -Previously stable, follow repeat labs  Postop shock- related to blood loss and fluid shifts, resolved -Likely hypovolemic in nature complicated by blood loss  New onset atrial fibrillation, currently rate controlled.  Suspect provoked due to postop stressors, no known history -Likely provoked in the setting of above, rate generally controlled but does have episodes of tachycardia into the 150s and 160s that appear to be asymptomatic. -Patient continues on amiodarone drip - may be able to discontinue in the next 24 to 48 hours if he converts back to NSR since this is not a chronic problem  History of sleep apnea, not on home CPAP -Could initiate nocturnal CPAP if he is willing to wear it.  DVT prophylaxis: SCDs, no chemical DVT prophylaxis in the setting of high risk for hemorrhagic conversion as above Code Status: Full Family Communication: Wife at bedside  Status is: Inpatient  Dispo: The patient is from: Home  Anticipated d/c is to: To  be determined              Anticipated d/c date is: To be determined >72h              Patient currently not medically stable for discharge  Consultants:  PCCM, general surgery, oncology, radiology  Procedures:  Port catheter placed 5/24 Exploratory laparotomy with diverting ostomy 5/28  Antimicrobials:  Completed Zosyn 5/31  Subjective: Erroneous labs overnight - repeat correlated he is near prior baseline. More awake/alert/oriented today - pain ongoing but improving. Still feverish.  Objective: Vitals:   09/29/22 0300 09/29/22 0400 09/29/22 0450 09/29/22 0500  BP: (!) 97/55 128/61  (!) 126/49  Pulse: 81 89  99  Resp: 14 (!) 23  (!) 27  Temp:  98.7 F (37.1 C)    TempSrc:  Axillary    SpO2: 94% 99%  96%  Weight:   92.3 kg   Height:        Intake/Output Summary (Last 24 hours) at 09/29/2022 0709 Last data filed at 09/29/2022 0600 Gross per 24 hour  Intake 2324.06 ml  Output 1670 ml  Net 654.06 ml    Filed Weights   09/27/22 0500 09/28/22 0439 09/29/22 0450  Weight: 86.8 kg 91.3 kg 92.3 kg    Examination:  General:  Pleasantly resting in bed, No acute distress. Awake/Alert/Oriented HEENT: Tolerating simple mask. Neck:  Without mass or deformity.  Trachea is midline. Lungs: Diminished bilaterally without overt wheezes rales or rhonchi. Heart: Irregularly irregular heart rate occasionally up to 120s-130s during exam.  Without overt murmurs, rubs, or gallops. Abdomen: Bandage clean dry intact, JP drain intact Extremities: Warm, without edema or obvious deformity.   Data Reviewed: I have personally reviewed following labs and imaging studies  CBC: Recent Labs  Lab 09/23/22 0515 09/24/22 0127 09/24/22 1825 09/25/22 0439 09/26/22 0008 09/26/22 0514 09/26/22 0826 09/26/22 1309 09/28/22 1640 09/29/22 0407  WBC 12.6* 19.9*   < > 36.9* 10.6* 15.2* 14.2* 10.4  --  20.4*  NEUTROABS 11.0* 17.8*  --  33.7*  --  13.7*  --   --   --  17.9*  HGB 10.2* 12.1*   < >  11.5* 7.7* 7.1* 6.9* 7.2* 7.6* 7.8*  HCT 33.2* 39.0   < > 36.9* 24.6* 23.4* 22.5* 22.6* 24.3* 24.8*  MCV 81.0 80.9   < > 82.2 82.8 82.4 81.8 82.2  --  85.2  PLT 306 490*   < > 439* 183 328 274 199  --  164   < > = values in this interval not displayed.    Basic Metabolic Panel: Recent Labs  Lab 09/25/22 0439 09/26/22 0407 09/26/22 1841 09/27/22 0758 09/28/22 0430 09/28/22 1419 09/29/22 0407 09/29/22 0544  NA 139 137 140 138 137 135 132* 137  K 4.2 3.8 3.5 3.4* 3.3* 3.9  3.9 7.4* 3.6  CL 107 106 107 106 103 103 101 105  CO2 22 24 25 24 24 24 23 24   GLUCOSE 148* 122* 111* 110* 105* 211* 889* 105*  BUN 29* 31* 23 23 18 17 15 17   CREATININE 1.34* 0.96 0.85 0.79 0.78 0.69 0.71 0.64  CALCIUM 7.8* 7.4* 7.9* 8.1* 7.8* 7.3* 7.0* 7.7*  MG 2.6* 2.4 2.3 2.3 1.9  --  2.2  --   PHOS 5.9* 2.6  --  2.0* 3.2  --  6.3*  --     GFR: Estimated Creatinine Clearance: 101.6 mL/min (by C-G formula based on SCr  of 0.64 mg/dL). Liver Function Tests: Recent Labs  Lab 09/23/22 0515 09/24/22 0127 09/25/22 0439 09/26/22 0407 09/28/22 1015 09/29/22 0407  AST 29 33 29 26  --   --   ALT 17 23 18 17   --   --   ALKPHOS 52 69 44 47  --   --   BILITOT 0.6 0.5 1.1 0.9  --   --   PROT 5.9* 6.6 5.1* 4.6*  --   --   ALBUMIN 2.5* 3.1* 2.6* 2.0* 2.1* 1.9*    No results for input(s): "LIPASE", "AMYLASE" in the last 168 hours. No results for input(s): "AMMONIA" in the last 168 hours. Coagulation Profile: No results for input(s): "INR", "PROTIME" in the last 168 hours. Cardiac Enzymes: No results for input(s): "CKTOTAL", "CKMB", "CKMBINDEX", "TROPONINI" in the last 168 hours. BNP (last 3 results) No results for input(s): "PROBNP" in the last 8760 hours. HbA1C: No results for input(s): "HGBA1C" in the last 72 hours. CBG: Recent Labs  Lab 09/27/22 2329 09/28/22 0700 09/28/22 1102 09/28/22 1740 09/29/22 0534  GLUCAP 108* 108* 104* 108* 89    Lipid Profile: Recent Labs    09/27/22 0758  TRIG  204*    Thyroid Function Tests: No results for input(s): "TSH", "T4TOTAL", "FREET4", "T3FREE", "THYROIDAB" in the last 72 hours. Anemia Panel: No results for input(s): "VITAMINB12", "FOLATE", "FERRITIN", "TIBC", "IRON", "RETICCTPCT" in the last 72 hours. Sepsis Labs: Recent Labs  Lab 09/24/22 1240 09/24/22 1825 09/25/22 0439 09/26/22 0008  LATICACIDVEN 3.8* 3.7* 3.7* 2.1*     Recent Results (from the past 240 hour(s))  MRSA Next Gen by PCR, Nasal     Status: None   Collection Time: 09/24/22 11:05 AM   Specimen: Nasal Mucosa; Nasal Swab  Result Value Ref Range Status   MRSA by PCR Next Gen NOT DETECTED NOT DETECTED Final    Comment: (NOTE) The GeneXpert MRSA Assay (FDA approved for NASAL specimens only), is one component of a comprehensive MRSA colonization surveillance program. It is not intended to diagnose MRSA infection nor to guide or monitor treatment for MRSA infections. Test performance is not FDA approved in patients less than 82 years old. Performed at [facility], [address], [place], [place] [address]          Radiology Studies: No results found.  Scheduled Meds:  sodium chloride   Intravenous Once   Chlorhexidine Gluconate Cloth  6 each Topical Daily   insulin aspart  0-9 Units Subcutaneous Q6H   pantoprazole (PROTONIX) IV  40 mg Intravenous Q24H   sodium chloride flush  10-40 mL Intracatheter Q12H   thiamine (VITAMIN B1) injection  100 mg Intravenous Daily   Continuous Infusions:  amiodarone 30 mg/hr (09/29/22 0457)   chlorproMAZINE (THORAZINE) 12.5 mg in sodium chloride 0.9 % 25 mL IVPB Stopped (09/29/22 0456)   lactated ringers 40 mL/hr at 09/29/22 0457   phenylephrine (NEO-SYNEPHRINE) Adult infusion Stopped (09/26/22 1404)   TPN ADULT (ION) 60 mL/hr at 09/29/22 0457     LOS: 10 days   Time spent:  Azucena Fallen, DO [facility]  If 7PM-7AM, please contact  night-coverage [email or web address]  09/29/2022, 7:09 AM

## 2022-09-29 NOTE — Progress Notes (Signed)
    Patient Name: Justin Cameron           DOB: 1955/06/21  MRN: [id number]      Admission Date: 09/19/2022  Attending Provider: Azucena Fallen, MD  Primary Diagnosis: Large cell (diffuse) non-Hodgkin's lymphoma [facility])   Level of care: Stepdown    CROSS COVER NOTE   Date of Service   09/29/2022   Justin Cameron, 67 y.o. male, was admitted on 09/19/2022 for Large cell (diffuse) non-Hodgkin's lymphoma (HCC).    HPI/Events of Note   Critical labs Potassium 7.4 and glucose 889 reported  Previous potassium and glucose levels do not correlate with these morning labs.  PICC was used for collection, patient currently receiving TPN. There is likelihood for erroneous lab collection. Urgent BMP ordered for recollection to verify levels  Current CBG 89 EKG interpreted as sinus tachycardia without ST segment elevation or T wave changes   Interventions/ Plan   URGENT BMP results--> potassium 3.6, glucose 105 Stat CBG EKG        Anthoney Harada, DNP, ACNPC- AG [facility] [place]

## 2022-09-29 NOTE — Progress Notes (Signed)
PHARMACY - TOTAL PARENTERAL NUTRITION CONSULT NOTE   Indication: Prolonged ileus  Patient Measurements: Height: 5\' 8"  (172.7 cm) Weight: 92.3 kg (203 lb 7.8 oz) IBW/kg (Calculated) : 68.4 TPN AdjBW (KG): 81 Body mass index is 30.94 kg/m. 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  He was supposed to start chemo, but developed severe acute abdominal pain on 5/28.  NG tube placed, imaging to confirm placement shows possible free air and he was taken to OR on 5/28.  Pharmacy consulted to dose TPN for prolonged ileus.    Glucose / Insulin: No hx DM, no PTA medications.   - CBG range: 89-211. Goal: 100-180. 1 units of SSI required in 24 hrs. - Solumedrol 250mg  5/23 and 5/24, solumedrol 125mg  5/25 and 5/26, dexamethasone 10mg  5/28 Electrolytes:  Low: K 3.6, magnesium 1.8.  All other lytes WNL, including CorrCa (steady).  Hypophosphatemia resolved Renal: SCr and BUN WNL Hepatic: AST/ALT, Tbili WNL. TG trending down off of propofol Intake / Output: -145.9 mL -mIVF: LR @ 40 mL/hr -Output: Drain 395 mL, Urine 400 mL. NGT removed GI Imaging:  -5/28  Lucency in the right upper quadrant was not seen on the previous images. Free intraperitoneal air is not excluded.  GI Surgeries / Procedures:  -5/28 Exploratory laparotomy with right hemicolectomy and end ileostomy.  -5/29 IVC filter placed  Central access: Port (09/20/22), Triple lumen PICC placed 5/29 TPN start date: 5/29  Nutritional Goals: Goal TPN rate of 100 mL/hr provides 127 g of protein and 2453 kcals per day  RD Assessment: Estimated Needs Total Energy Estimated Needs: 2400-2600 kcals Total Protein Estimated Needs: 120-130 gm Total Fluid Estimated Needs: >/= 2.4 L  Current Nutrition:  Per surgery on 6/1, Leave on clear liquids for now  Plan:  Now: KCl 10 mEq q1h x  4 Magnesium 2 g IV x 1  At 1800: Increase TPN to 80 mL/hr Will provide 102 g protein, 1962 kcal Electrolytes in TPN: no changes made in composition due to rate increase Na 100 mEq/L, K 60 mEq/L, Ca 0 mEq/L, Mg 5 mEq/L, and Phos 15 mmol/L.  Cl:Ac 1:1 Add standard MVI and trace elements to TPN Thiamine 100 mg IV daily x5 days through 6/3 Continue sensitive SSI q6h and adjust as needed  Continue MIVF at 40 mL/hr.  Further adjustment per MD. Monitor TPN labs on Mon/Thurs Monitor electrolytes closely if chemotherapy initiated as pt will be at high risk for TLS   Thank you for allowing pharmacy to be a part of this patient's care.  Selinda Eon, PharmD, BCPS Clinical Pharmacist [place] Please utilize Amion for appropriate phone number to reach the unit pharmacist [facility]) 09/29/2022 8:04 AM

## 2022-09-29 NOTE — Progress Notes (Signed)
PT Cancellation Note  Patient Details Name: Justin Cameron MRN: [id number] DOB: 1955/09/30   Cancelled Treatment:     PT deferred, pt to CT this am.  Will follow.   Roselin Wiemann 09/29/2022, 12:07 PM

## 2022-09-29 NOTE — Progress Notes (Signed)
5 Days Post-Op   Subjective/Chief Complaint: More awake but extremely washed out Wife at bedside    Objective: Vital signs in last 24 hours: Temp:  [97.1 F (36.2 C)-102.1 F (38.9 C)] (P) 98.4 F (36.9 C) (06/02 0809) Pulse Rate:  [81-153] 101 (06/02 0800) Resp:  [14-31] 20 (06/02 0800) BP: (93-139)/(34-64) 104/52 (06/02 0800) SpO2:  [94 %-100 %] 95 % (06/02 0800) Weight:  [92.3 kg] 92.3 kg (06/02 0450) Last BM Date : 09/28/22  Intake/Output from previous day: 06/01 0701 - 06/02 0700 In: 2324.1 [I.V.:2203.8; IV Piggyback:120.3] Out: 2470 [Urine:400; Drains:395; Stool:1675] Intake/Output this shift: No intake/output data recorded.  Abdomen: incision open some necrotic tissue noted along inferior and superior portion of incision each 1 - 2 cm fascia entact without separation  Drainage is minimal  Ostomy RLQ pink and functioning  Drain serosanguinous   Lab Results:  Recent Labs    09/26/22 1309 09/28/22 1640 09/29/22 0407  WBC 10.4  --  20.4*  HGB 7.2* 7.6* 7.8*  HCT 22.6* 24.3* 24.8*  PLT 199  --  164   BMET Recent Labs    09/29/22 0407 09/29/22 0544  NA 132* 137  K 7.4* 3.6  CL 101 105  CO2 23 24  GLUCOSE 889* 105*  BUN 15 17  CREATININE 0.71 0.64  CALCIUM 7.0* 7.7*   PT/INR No results for input(s): "LABPROT", "INR" in the last 72 hours. ABG No results for input(s): "PHART", "HCO3" in the last 72 hours.  Invalid input(s): "PCO2", "PO2"  Studies/Results: No results found.  Anti-infectives: Anti-infectives (From admission, onward)    Start     Dose/Rate Route Frequency Ordered Stop   09/20/22 1402  ceFAZolin (ANCEF) IVPB 2g/100 mL premix        over 30 Minutes Intravenous Continuous PRN 09/20/22 1411 09/20/22 1402   09/20/22 0600  piperacillin-tazobactam (ZOSYN) IVPB 3.375 g  Status:  Discontinued        3.375 g 12.5 mL/hr over 240 Minutes Intravenous Every 8 hours 09/19/22 1943 09/28/22 1134   09/19/22 1800  amoxicillin-clavulanate  (AUGMENTIN) 875-125 MG per tablet 1 tablet  Status:  Discontinued        1 tablet Oral Every 12 hours 09/19/22 1659 09/19/22 1939       Assessment/Plan: s/p Procedure(s): EXPLORATORY LAPAROTOMY, BOWEL RESECTION, ILEOSTOMY (N/A) CT Monday Follow wounds closely -high risk of wound issues due to overall condition and perforation  WBC- rising - cont ABX  and CT Monday- high risk of intraabdominal abscess given colonic perforation/ immunocompromised state. Large necrotic tumor burden remains given extent of lymphoma during initial exploration.   Cont TNA but can advance diet if desired   A fib- rate controlled for now        LOS: 10 days    Dortha Schwalbe  MD  09/29/2022

## 2022-09-30 ENCOUNTER — Inpatient Hospital Stay (HOSPITAL_COMMUNITY): Payer: Medicare Other

## 2022-09-30 DIAGNOSIS — C8330 Diffuse large B-cell lymphoma, unspecified site: Secondary | ICD-10-CM | POA: Diagnosis not present

## 2022-09-30 DIAGNOSIS — R1011 Right upper quadrant pain: Secondary | ICD-10-CM | POA: Diagnosis not present

## 2022-09-30 DIAGNOSIS — K5669 Other partial intestinal obstruction: Secondary | ICD-10-CM | POA: Diagnosis not present

## 2022-09-30 LAB — BLOOD GAS, ARTERIAL
Acid-base deficit: 4.5 mmol/L — ABNORMAL HIGH (ref 0.0–2.0)
Acid-base deficit: 1.8 mmol/L (ref 0.0–2.0)
Bicarbonate: 25.5 mmol/L (ref 20.0–28.0)
Bicarbonate: 24.7 mmol/L (ref 20.0–28.0)
Drawn by: 270211
Drawn by: 51133
FIO2: 100 %
FIO2: 70 %
MECHVT: 540 mL
MECHVT: 540 mL
O2 Saturation: 99 %
O2 Saturation: 100 %
PEEP: 5 cmH2O
PEEP: 5 cmH2O
Patient temperature: 40.1
Patient temperature: 36.8
RATE: 16 resp/min
RATE: 24 resp/min
pCO2 arterial: 80 mmHg (ref 32–48)
pCO2 arterial: 48 mmHg (ref 32–48)
pH, Arterial: 7.13 — CL (ref 7.35–7.45)
pH, Arterial: 7.32 — ABNORMAL LOW (ref 7.35–7.45)
pO2, Arterial: 126 mmHg — ABNORMAL HIGH (ref 83–108)
pO2, Arterial: 141 mmHg — ABNORMAL HIGH (ref 83–108)

## 2022-09-30 LAB — COMPREHENSIVE METABOLIC PANEL
ALT: 15 U/L (ref 0–44)
ALT: 15 U/L (ref 0–44)
AST: 27 U/L (ref 15–41)
AST: 36 U/L (ref 15–41)
Albumin: 2 g/dL — ABNORMAL LOW (ref 3.5–5.0)
Albumin: 1.8 g/dL — ABNORMAL LOW (ref 3.5–5.0)
Alkaline Phosphatase: 105 U/L (ref 38–126)
Alkaline Phosphatase: 141 U/L — ABNORMAL HIGH (ref 38–126)
Anion gap: 7 (ref 5–15)
Anion gap: 8 (ref 5–15)
BUN: 16 mg/dL (ref 8–23)
BUN: 24 mg/dL — ABNORMAL HIGH (ref 8–23)
CO2: 25 mmol/L (ref 22–32)
CO2: 26 mmol/L (ref 22–32)
Calcium: 7.5 mg/dL — ABNORMAL LOW (ref 8.9–10.3)
Calcium: 7.3 mg/dL — ABNORMAL LOW (ref 8.9–10.3)
Chloride: 102 mmol/L (ref 98–111)
Chloride: 105 mmol/L (ref 98–111)
Creatinine, Ser: 0.67 mg/dL (ref 0.61–1.24)
Creatinine, Ser: 0.93 mg/dL (ref 0.61–1.24)
GFR, Estimated: >60 mL/min (ref >60)
GFR, Estimated: >60 mL/min (ref >60)
Glucose, Bld: 142 mg/dL — ABNORMAL HIGH (ref 70–99)
Glucose, Bld: 161 mg/dL — ABNORMAL HIGH (ref 70–99)
Potassium: 4.1 mmol/L (ref 3.5–5.1)
Potassium: 4.4 mmol/L (ref 3.5–5.1)
Sodium: 134 mmol/L — ABNORMAL LOW (ref 135–145)
Sodium: 139 mmol/L (ref 135–145)
Total Bilirubin: 1.2 mg/dL (ref 0.3–1.2)
Total Bilirubin: 1.5 mg/dL — ABNORMAL HIGH (ref 0.3–1.2)
Total Protein: 5.1 g/dL — ABNORMAL LOW (ref 6.5–8.1)
Total Protein: 4.6 g/dL — ABNORMAL LOW (ref 6.5–8.1)

## 2022-09-30 LAB — CBC
HCT: 26 % — ABNORMAL LOW (ref 39.0–52.0)
Hemoglobin: 8.4 g/dL — ABNORMAL LOW (ref 13.0–17.0)
MCH: 26.4 pg (ref 26.0–34.0)
MCHC: 32.3 g/dL (ref 30.0–36.0)
MCV: 81.8 fL (ref 80.0–100.0)
Platelets: 198 10*3/uL (ref 150–400)
RBC: 3.18 MIL/uL — ABNORMAL LOW (ref 4.22–5.81)
RDW: 18.2 % — ABNORMAL HIGH (ref 11.5–15.5)
WBC: 25.9 10*3/uL — ABNORMAL HIGH (ref 4.0–10.5)
nRBC: 0 % (ref 0.0–0.2)

## 2022-09-30 LAB — DIC (DISSEMINATED INTRAVASCULAR COAGULATION)PANEL
D-Dimer, Quant: >20 ug/mL-FEU — ABNORMAL HIGH (ref 0.00–0.50)
Fibrinogen: 756 mg/dL — ABNORMAL HIGH (ref 210–475)
INR: 1.2 (ref 0.8–1.2)
Platelets: 208 10*3/uL (ref 150–400)
Prothrombin Time: 15.6 seconds — ABNORMAL HIGH (ref 11.4–15.2)
Smear Review: NONE SEEN
aPTT: 27 seconds (ref 24–36)

## 2022-09-30 LAB — LACTIC ACID, PLASMA
Lactic Acid, Venous: 3.3 mmol/L (ref 0.5–1.9)
Lactic Acid, Venous: 3.5 mmol/L (ref 0.5–1.9)

## 2022-09-30 LAB — CBC WITH DIFFERENTIAL/PLATELET
Abs Immature Granulocytes: 1.18 10*3/uL — ABNORMAL HIGH (ref 0.00–0.07)
Abs Immature Granulocytes: 1.36 10*3/uL — ABNORMAL HIGH (ref 0.00–0.07)
Basophils Absolute: 0.1 10*3/uL (ref 0.0–0.1)
Basophils Absolute: 0.1 10*3/uL (ref 0.0–0.1)
Basophils Relative: 0 %
Basophils Relative: 0 %
Eosinophils Absolute: 0.2 10*3/uL (ref 0.0–0.5)
Eosinophils Absolute: 0.4 10*3/uL (ref 0.0–0.5)
Eosinophils Relative: 1 %
Eosinophils Relative: 1 %
HCT: 25.3 % — ABNORMAL LOW (ref 39.0–52.0)
HCT: 29.7 % — ABNORMAL LOW (ref 39.0–52.0)
Hemoglobin: 8 g/dL — ABNORMAL LOW (ref 13.0–17.0)
Hemoglobin: 9.2 g/dL — ABNORMAL LOW (ref 13.0–17.0)
Immature Granulocytes: 5 %
Immature Granulocytes: 4 %
Lymphocytes Relative: 3 %
Lymphocytes Relative: 2 %
Lymphs Abs: 0.7 10*3/uL (ref 0.7–4.0)
Lymphs Abs: 0.5 10*3/uL — ABNORMAL LOW (ref 0.7–4.0)
MCH: 25.8 pg — ABNORMAL LOW (ref 26.0–34.0)
MCH: 25.8 pg — ABNORMAL LOW (ref 26.0–34.0)
MCHC: 31.6 g/dL (ref 30.0–36.0)
MCHC: 31 g/dL (ref 30.0–36.0)
MCV: 81.6 fL (ref 80.0–100.0)
MCV: 83.4 fL (ref 80.0–100.0)
Monocytes Absolute: 1.7 10*3/uL — ABNORMAL HIGH (ref 0.1–1.0)
Monocytes Absolute: 0.5 10*3/uL (ref 0.1–1.0)
Monocytes Relative: 7 %
Monocytes Relative: 1 %
Neutro Abs: 21.1 10*3/uL — ABNORMAL HIGH (ref 1.7–7.7)
Neutro Abs: 30.7 10*3/uL — ABNORMAL HIGH (ref 1.7–7.7)
Neutrophils Relative %: 84 %
Neutrophils Relative %: 92 %
Platelets: 178 10*3/uL (ref 150–400)
Platelets: 219 10*3/uL (ref 150–400)
RBC: 3.1 MIL/uL — ABNORMAL LOW (ref 4.22–5.81)
RBC: 3.56 MIL/uL — ABNORMAL LOW (ref 4.22–5.81)
RDW: 18.4 % — ABNORMAL HIGH (ref 11.5–15.5)
RDW: 18.4 % — ABNORMAL HIGH (ref 11.5–15.5)
WBC: 24.9 10*3/uL — ABNORMAL HIGH (ref 4.0–10.5)
WBC: 33.5 10*3/uL — ABNORMAL HIGH (ref 4.0–10.5)
nRBC: 0 % (ref 0.0–0.2)
nRBC: 0.1 % (ref 0.0–0.2)

## 2022-09-30 LAB — BASIC METABOLIC PANEL
Anion gap: 9 (ref 5–15)
BUN: 16 mg/dL (ref 8–23)
CO2: 25 mmol/L (ref 22–32)
Calcium: 7.8 mg/dL — ABNORMAL LOW (ref 8.9–10.3)
Chloride: 103 mmol/L (ref 98–111)
Creatinine, Ser: 0.66 mg/dL (ref 0.61–1.24)
GFR, Estimated: >60 mL/min (ref >60)
Glucose, Bld: 117 mg/dL — ABNORMAL HIGH (ref 70–99)
Potassium: 3.9 mmol/L (ref 3.5–5.1)
Sodium: 137 mmol/L (ref 135–145)

## 2022-09-30 LAB — GLUCOSE, CAPILLARY
Glucose-Capillary: 128 mg/dL — ABNORMAL HIGH (ref 70–99)
Glucose-Capillary: 115 mg/dL — ABNORMAL HIGH (ref 70–99)
Glucose-Capillary: 129 mg/dL — ABNORMAL HIGH (ref 70–99)
Glucose-Capillary: 165 mg/dL — ABNORMAL HIGH (ref 70–99)
Glucose-Capillary: 218 mg/dL — ABNORMAL HIGH (ref 70–99)

## 2022-09-30 LAB — MAGNESIUM: Magnesium: 2.1 mg/dL (ref 1.7–2.4)

## 2022-09-30 LAB — TYPE AND SCREEN
ABO/RH(D): O POS
Antibody Screen: NEGATIVE

## 2022-09-30 LAB — PHOSPHORUS: Phosphorus: 3.7 mg/dL (ref 2.5–4.6)

## 2022-09-30 LAB — CK: Total CK: 26 U/L — ABNORMAL LOW (ref 49–397)

## 2022-09-30 MED ORDER — ETOMIDATE 2 MG/ML IV SOLN
INTRAVENOUS | Status: AC
  Filled 2022-09-30: qty 20

## 2022-09-30 MED ORDER — FAMOTIDINE 20 MG PO TABS: 20.0000 mg | ORAL_TABLET | Freq: Two times a day (BID) | ORAL | Status: DC

## 2022-09-30 MED ORDER — SODIUM CHLORIDE 0.9 % IV SOLN: INTRAVENOUS | Status: DC | PRN

## 2022-09-30 MED ORDER — SODIUM BICARBONATE 8.4 % IV SOLN
INTRAVENOUS | Status: AC
  Filled 2022-09-30: qty 100

## 2022-09-30 MED ORDER — DOCUSATE SODIUM 50 MG/5ML PO LIQD
100.0000 mg | Freq: Two times a day (BID) | ORAL | Status: DC
  Administered 2022-10-01 – 2022-10-11 (×14): 100 mg
  Filled 2022-09-30 (×15): qty 10

## 2022-09-30 MED ORDER — EPINEPHRINE HCL 5 MG/250ML IV SOLN IN NS: 0.5000 ug/min | INTRAVENOUS | Status: DC

## 2022-09-30 MED ORDER — NOREPINEPHRINE 4 MG/250ML-% IV SOLN
0.0000 ug/min | INTRAVENOUS | Status: DC
  Administered 2022-09-30: 2 ug/min via INTRAVENOUS
  Administered 2022-09-30: 21 ug/min via INTRAVENOUS
  Administered 2022-10-01: 30 ug/min via INTRAVENOUS
  Administered 2022-10-01: 23 ug/min via INTRAVENOUS
  Administered 2022-10-01: 22 ug/min via INTRAVENOUS
  Administered 2022-10-01 (×2): 24 ug/min via INTRAVENOUS
  Filled 2022-09-30 (×6): qty 250

## 2022-09-30 MED ORDER — SODIUM CHLORIDE 0.9% FLUSH
5.0000 mL | Freq: Three times a day (TID) | INTRAVENOUS | Status: DC
  Administered 2022-09-30 – 2022-10-11 (×31): 5 mL

## 2022-09-30 MED ORDER — SODIUM BICARBONATE 8.4 % IV SOLN
100.0000 meq | Freq: Once | INTRAVENOUS | Status: AC
  Administered 2022-09-30: 100 meq via INTRAVENOUS

## 2022-09-30 MED ORDER — VASOPRESSIN 20 UNITS/100 ML INFUSION FOR SHOCK
0.0000 [IU]/min | INTRAVENOUS | Status: DC
  Administered 2022-09-30 – 2022-10-02 (×6): 0.03 [IU]/min via INTRAVENOUS
  Filled 2022-09-30 (×7): qty 100

## 2022-09-30 MED ORDER — DIPHENHYDRAMINE HCL 50 MG/ML IJ SOLN
INTRAMUSCULAR | Status: AC
  Filled 2022-09-30: qty 1

## 2022-09-30 MED ORDER — FENTANYL CITRATE (PF) 100 MCG/2ML IJ SOLN
INTRAMUSCULAR | Status: AC
  Filled 2022-09-30: qty 2

## 2022-09-30 MED ORDER — MIDAZOLAM HCL 2 MG/2ML IJ SOLN
1.0000 mg | INTRAMUSCULAR | Status: DC | PRN
  Administered 2022-10-01: 2 mg via INTRAVENOUS
  Filled 2022-09-30: qty 2

## 2022-09-30 MED ORDER — LACTATED RINGERS IV BOLUS
1000.0000 mL | Freq: Once | INTRAVENOUS | Status: AC
  Administered 2022-09-30: 1000 mL via INTRAVENOUS

## 2022-09-30 MED ORDER — ONDANSETRON HCL 4 MG/2ML IJ SOLN
INTRAMUSCULAR | Status: AC
  Filled 2022-09-30: qty 2

## 2022-09-30 MED ORDER — FENTANYL BOLUS VIA INFUSION
25.0000 ug | INTRAVENOUS | Status: DC | PRN
  Administered 2022-10-01 (×5): 50 ug via INTRAVENOUS
  Administered 2022-10-01: 75 ug via INTRAVENOUS
  Administered 2022-10-01 (×2): 50 ug via INTRAVENOUS
  Administered 2022-10-01: 25 ug via INTRAVENOUS
  Administered 2022-10-01 (×2): 50 ug via INTRAVENOUS
  Administered 2022-10-01 – 2022-10-02 (×3): 75 ug via INTRAVENOUS
  Administered 2022-10-02: 50 ug via INTRAVENOUS
  Administered 2022-10-02 (×2): 75 ug via INTRAVENOUS
  Administered 2022-10-02: 100 ug via INTRAVENOUS
  Administered 2022-10-02: 75 ug via INTRAVENOUS
  Administered 2022-10-02 (×2): 50 ug via INTRAVENOUS
  Administered 2022-10-02: 75 ug via INTRAVENOUS
  Administered 2022-10-03: 25 ug via INTRAVENOUS
  Administered 2022-10-03: 100 ug via INTRAVENOUS

## 2022-09-30 MED ORDER — FENTANYL CITRATE (PF) 100 MCG/2ML IJ SOLN
INTRAMUSCULAR | Status: AC | PRN
  Administered 2022-09-30 (×2): 50 ug via INTRAVENOUS

## 2022-09-30 MED ORDER — FENTANYL CITRATE PF 50 MCG/ML IJ SOSY
PREFILLED_SYRINGE | INTRAMUSCULAR | Status: AC
  Filled 2022-09-30: qty 2

## 2022-09-30 MED ORDER — MIDAZOLAM HCL 2 MG/2ML IJ SOLN
INTRAMUSCULAR | Status: AC
  Filled 2022-09-30: qty 6

## 2022-09-30 MED ORDER — BUSPIRONE HCL 10 MG PO TABS: 30.0000 mg | ORAL_TABLET | Freq: Three times a day (TID) | ORAL | Status: AC | PRN

## 2022-09-30 MED ORDER — FENTANYL 2500MCG IN NS 250ML (10MCG/ML) PREMIX INFUSION
25.0000 ug/h | INTRAVENOUS | Status: DC
  Administered 2022-09-30: 25 ug/h via INTRAVENOUS
  Administered 2022-10-01: 150 ug/h via INTRAVENOUS
  Administered 2022-10-01: 200 ug/h via INTRAVENOUS
  Administered 2022-10-02 – 2022-10-03 (×2): 125 ug/h via INTRAVENOUS
  Filled 2022-09-30 (×5): qty 250

## 2022-09-30 MED ORDER — PHENYLEPHRINE 80 MCG/ML (10ML) SYRINGE FOR IV PUSH (FOR BLOOD PRESSURE SUPPORT)
PREFILLED_SYRINGE | INTRAVENOUS | Status: AC
  Filled 2022-09-30: qty 10

## 2022-09-30 MED ORDER — DEXMEDETOMIDINE HCL IN NACL 200 MCG/50ML IV SOLN: 0.0000 ug/kg/h | INTRAVENOUS | Status: DC

## 2022-09-30 MED ORDER — ROCURONIUM BROMIDE 50 MG/5ML IV SOLN
100.0000 mg | Freq: Once | INTRAVENOUS | Status: AC
  Administered 2022-09-30: 100 mg via INTRAVENOUS

## 2022-09-30 MED ORDER — PIPERACILLIN-TAZOBACTAM 3.375 G IVPB
3.3750 g | Freq: Three times a day (TID) | INTRAVENOUS | Status: DC
  Administered 2022-09-30 – 2022-10-01 (×5): 3.375 g via INTRAVENOUS
  Filled 2022-09-30 (×5): qty 50

## 2022-09-30 MED ORDER — VANCOMYCIN HCL 1750 MG/350ML IV SOLN
1750.0000 mg | INTRAVENOUS | Status: DC
  Administered 2022-10-01 – 2022-10-02 (×2): 1750 mg via INTRAVENOUS
  Filled 2022-09-30 (×2): qty 350

## 2022-09-30 MED ORDER — ROCURONIUM BROMIDE 10 MG/ML (PF) SYRINGE
PREFILLED_SYRINGE | INTRAVENOUS | Status: AC
  Filled 2022-09-30: qty 10

## 2022-09-30 MED ORDER — NOREPINEPHRINE 4 MG/250ML-% IV SOLN
INTRAVENOUS | Status: AC
  Filled 2022-09-30: qty 250

## 2022-09-30 MED ORDER — ACETAMINOPHEN 10 MG/ML IV SOLN
1000.0000 mg | Freq: Four times a day (QID) | INTRAVENOUS | Status: DC
  Administered 2022-09-30: 1000 mg via INTRAVENOUS
  Filled 2022-09-30: qty 100

## 2022-09-30 MED ORDER — FENTANYL CITRATE PF 50 MCG/ML IJ SOSY
50.0000 ug | PREFILLED_SYRINGE | Freq: Once | INTRAMUSCULAR | Status: AC
  Administered 2022-09-30: 50 ug via INTRAVENOUS

## 2022-09-30 MED ORDER — MIDAZOLAM BOLUS VIA INFUSION
0.0000 mg | INTRAVENOUS | Status: DC | PRN
  Administered 2022-09-30: 2 mg via INTRAVENOUS
  Administered 2022-10-01: 1 mg via INTRAVENOUS

## 2022-09-30 MED ORDER — ONDANSETRON HCL 4 MG/2ML IJ SOLN
INTRAMUSCULAR | Status: AC | PRN
  Administered 2022-09-30: 4 mg via INTRAVENOUS

## 2022-09-30 MED ORDER — ORAL CARE MOUTH RINSE
15.0000 mL | OROMUCOSAL | Status: DC
  Administered 2022-09-30: 15 mL via OROMUCOSAL

## 2022-09-30 MED ORDER — FENTANYL CITRATE PF 50 MCG/ML IJ SOSY
25.0000 ug | PREFILLED_SYRINGE | Freq: Once | INTRAMUSCULAR | Status: AC
  Administered 2022-09-30: 25 ug via INTRAVENOUS

## 2022-09-30 MED ORDER — INSULIN ASPART 100 UNIT/ML IJ SOLN
0.0000 [IU] | INTRAMUSCULAR | Status: DC
  Administered 2022-09-30: 3 [IU] via SUBCUTANEOUS
  Administered 2022-10-01 (×2): 1 [IU] via SUBCUTANEOUS
  Administered 2022-10-01: 2 [IU] via SUBCUTANEOUS
  Administered 2022-10-01: 3 [IU] via SUBCUTANEOUS
  Administered 2022-10-01: 2 [IU] via SUBCUTANEOUS
  Administered 2022-10-02 (×3): 1 [IU] via SUBCUTANEOUS
  Administered 2022-10-02 – 2022-10-03 (×3): 2 [IU] via SUBCUTANEOUS
  Administered 2022-10-03: 1 [IU] via SUBCUTANEOUS
  Administered 2022-10-03: 2 [IU] via SUBCUTANEOUS
  Administered 2022-10-03: 1 [IU] via SUBCUTANEOUS
  Administered 2022-10-03: 2 [IU] via SUBCUTANEOUS

## 2022-09-30 MED ORDER — MIDAZOLAM-SODIUM CHLORIDE 100-0.9 MG/100ML-% IV SOLN
0.0000 mg/h | INTRAVENOUS | Status: DC
  Administered 2022-09-30: 2 mg/h via INTRAVENOUS
  Filled 2022-09-30: qty 100

## 2022-09-30 MED ORDER — ETOMIDATE 2 MG/ML IV SOLN
20.0000 mg | Freq: Once | INTRAVENOUS | Status: AC
  Administered 2022-09-30: 20 mg via INTRAVENOUS

## 2022-09-30 MED ORDER — MIDAZOLAM HCL 2 MG/2ML IJ SOLN
INTRAMUSCULAR | Status: AC | PRN
  Administered 2022-09-30 (×2): 1 mg via INTRAVENOUS

## 2022-09-30 MED ORDER — MIDAZOLAM HCL 2 MG/2ML IJ SOLN
INTRAMUSCULAR | Status: AC
  Filled 2022-09-30: qty 2

## 2022-09-30 MED ORDER — VANCOMYCIN HCL 2000 MG/400ML IV SOLN
2000.0000 mg | Freq: Once | INTRAVENOUS | Status: AC
  Administered 2022-09-30: 2000 mg via INTRAVENOUS
  Filled 2022-09-30: qty 400

## 2022-09-30 MED ORDER — MIDAZOLAM HCL 2 MG/2ML IJ SOLN
1.0000 mg | Freq: Once | INTRAMUSCULAR | Status: AC
  Administered 2022-09-30: 1 mg via INTRAVENOUS

## 2022-09-30 MED ORDER — POLYETHYLENE GLYCOL 3350 17 G PO PACK
17.0000 g | PACK | Freq: Every day | ORAL | Status: DC
  Administered 2022-10-01 – 2022-10-07 (×7): 17 g
  Filled 2022-09-30 (×8): qty 1

## 2022-09-30 MED ORDER — TRAVASOL 10 % IV SOLN
INTRAVENOUS | Status: AC
  Filled 2022-09-30: qty 1272

## 2022-09-30 NOTE — Progress Notes (Signed)
Pharmacy Antibiotic Note  Justin Cameron is a 67 y.o. male admitted on 09/19/2022 with abdominal pain and bowel obstruction. He is s/p right hemicolectomy and end ileostomy. Patient with new diagnosis of large B-cell lymphoma. Chemotherapy on hold pending recovery from surgery. He is on TPN at this time. JP drain in place. CT 6/2 with fluid collection; IR consulted for drain 6/3. Pharmacy has been consulted for Zosyn dosing.  Plan: -Zosyn 3.375 g IV q8h extended infusion  Height: 5\' 8"  (172.7 cm) Weight: 90.8 kg (200 lb 2.8 oz) IBW/kg (Calculated) : 68.4  Temp (24hrs), Avg:99.9 F (37.7 C), Min:98.4 F (36.9 C), Max:100.9 F (38.3 C)  Recent Labs  Lab 09/24/22 1240 09/24/22 1335 09/24/22 1825 09/25/22 0439 09/26/22 0008 09/26/22 0407 09/26/22 0514 09/26/22 0826 09/26/22 1309 09/26/22 1841 09/28/22 0430 09/28/22 1419 09/29/22 0407 09/29/22 0544 09/30/22 0357  WBC  --   --  42.6* 36.9* 10.6*  --  15.2* 14.2* 10.4  --   --   --  20.4*  --  24.9*  CREATININE  --    < > 1.53* 1.34*  --    < >  --   --   --    < > 0.78 0.69 0.71 0.64 0.67  LATICACIDVEN 3.8*  --  3.7* 3.7* 2.1*  --   --   --   --   --   --   --   --   --   --    < > = values in this interval not displayed.    Estimated Creatinine Clearance: 99.4 mL/min (by C-G formula based on SCr of 0.67 mg/dL).    Not on File  Antimicrobials this admission: Zosyn 5/23 >> 6/1, 6/3 >>  Dose adjustments this admission: NA  Microbiology results: 5/28 MRSA PCR: negative  Thank you for allowing pharmacy to be a part of this patient's care.  Pricilla Riffle, PharmD, BCPS Clinical Pharmacist 09/30/2022 11:02 AM

## 2022-09-30 NOTE — Progress Notes (Signed)
Oncology short note  Met with patient and family at bedside. Gradually recovering from surgery. Concerns with increasinf wbc count CT abd repeated today -- show large intra abd fluid collection. ON abx  Stann Mainland continue to follow peripherally will patient stable enough and healed from previous surgery. Appreciate excellent cares by surgery team and PCCM and hospital medicine team.  Wyvonnia Lora MD MS

## 2022-09-30 NOTE — Progress Notes (Addendum)
09/30/2022 APP Hazel Sams and I saw and evaluated the patient. Discussed with them and agree with their findings and plan as documented in the their note.  I have seen and evaluated the patient for acute hypoxic hypercapnic respiratory failure, septic shock  S:  Abrupt deterioration today after IR drainage of lower abdominal abscess. Tachypneic RR 40s-50s, decreased responsiveness, tachycardic to 150s. Given fluid bolus, ABX continued.   Discussed with pt's wife, decision made to proceed with intubation to support him through this profound systemic inflammatory response.  O: Blood pressure (!) 231/67, pulse (!) 151, temperature (!) 103 F (39.4 C), temperature source Axillary, resp. rate (!) 30, height 5\' 8"  (1.727 m), weight 90.8 kg, SpO2 100 %.   Exam: Drowsy, minimally responsive Diaphoretic, cool Tachypneic, diminished bl Abdominal mid-line incision with dressing in place, 2 drains - upper abdominal drain with s/s output, ileostomy   A:  # Septic shock from intraabdominal abscess sp IR drainage 6/3 # Hyperthermia # DLBCL lymphoma with bowel obstruction c/b perforation requiring ex lap 5/28 # Acute hypoxic hypercapnic respiratory failure due to septic shock # AF/RVR # Acute blood loss anemia # DVT LLE s/p IVC filter # At risk for TLS # Severe protein calorie malnutrition  P:  - continue zosyn - another liter LR - wean levo for map 65, continue vaso - amio gtt - trend cbc, check dic panel, CK - if significant escalation in pressor requirement and not attributable to more bleeding then consideration of broadening ABX to vanc/merrem - deep sedation, external cooling - surgery, onc following   Patient critically ill due to septic shock, acute respiratory failure, hyperthermia Interventions to address this today oversight of analgesosedation strategy Risk of deterioration without these interventions is high  I personally spent 42 minutes providing critical care not  including any separately billable procedures   Laroy Apple Pulmonary/Critical Care

## 2022-09-30 NOTE — Progress Notes (Addendum)
6 Days Post-Op   Subjective/Chief Complaint: Awake, fatigued, drain with more out and change in character, wife at bedside   Objective: Vital signs in last 24 hours: Temp:  [98.4 F (36.9 C)-100.9 F (38.3 C)] 100.5 F (38.1 C) (06/03 0500) Pulse Rate:  [104-108] 106 (06/03 0000) Resp:  [15-32] 19 (06/03 0000) BP: (102-127)/(45-61) 118/56 (06/03 0000) SpO2:  [89 %-97 %] 96 % (06/03 0000) Weight:  [90.8 kg] 90.8 kg (06/03 0500) Last BM Date : 09/30/22  Intake/Output from previous day: 06/02 0701 - 06/03 0700 In: 2921.4 [I.V.:2654.4; IV Piggyback:267] Out: 2385 [Urine:1425; Drains:550; Stool:410] Intake/Output this shift: Total I/O In: -  Out: 75 [Drains:75]  Abd: incision open with minimal granulation tissue, some necrotic tissue noted along inferior and superior portion of incision each 1 - 2 cm fascia intact without separation  Ostomy RLQ functional with some dark stool Drain serosanguinous, more dark today    Lab Results:  Recent Labs    09/29/22 0407 09/30/22 0357  WBC 20.4* 24.9*  HGB 7.8* 8.0*  HCT 24.8* 25.3*  PLT 164 178   BMET Recent Labs    09/29/22 0544 09/30/22 0357  NA 137 134*  K 3.6 4.1  CL 105 102  CO2 24 25  GLUCOSE 105* 142*  BUN 17 16  CREATININE 0.64 0.67  CALCIUM 7.7* 7.5*   PT/INR No results for input(s): "LABPROT", "INR" in the last 72 hours. ABG No results for input(s): "PHART", "HCO3" in the last 72 hours.  Invalid input(s): "PCO2", "PO2"  Studies/Results: CT ABDOMEN PELVIS W CONTRAST  Result Date: 09/29/2022 CLINICAL DATA:  Abdominal pain.  Postop. EXAM: CT ABDOMEN AND PELVIS WITH CONTRAST TECHNIQUE: Multidetector CT imaging of the abdomen and pelvis was performed using the standard protocol following bolus administration of intravenous contrast. RADIATION DOSE REDUCTION: This exam was performed according to the departmental dose-optimization program which includes automated exposure control, adjustment of the mA and/or kV  according to patient size and/or use of iterative reconstruction technique. CONTRAST:  80mL OMNIPAQUE IOHEXOL 300 MG/ML  SOLN COMPARISON:  09/18/2022. FINDINGS: Lower chest: Small right and trace left pleural effusions. Associated dependent lower lobe atelectasis, also greater on the right. Small focus of atelectasis at the base of the left upper lobe lingula. Hepatobiliary: Liver normal in size and attenuation. Multiple small low-attenuation liver masses consistent with cysts stable from the prior CT. Normal gallbladder. No bile duct dilation. Pancreas: Unremarkable. Spleen: Normal. Adrenals/Urinary Tract: Bilateral adrenal masses, unchanged from the recent prior CT, right 5.1 cm, 2 on the left 3.6 and 4.2 cm. Normal kidneys, ureters and bladder. Stomach/Bowel: Since the prior CT, abdominal surgery has been performed. There is a midline incision with the skin and subcutaneous fat open, fascia closed. Colon has been ligated at the level of the right mid transverse section. The ileum extends into a right anterior mid abdomen ostomy. There are postoperative collections. The largest is a heterogeneous right abdominal collection in the area of the necrotic mass. Soft tissue attenuation along some of the margins of this collection may reflect necrotic tissue or decompressed small bowel or a combination. The collection contains fluid and air. It measures approximately 23 cm from superior to inferior by 16 x 10 cm transversely. A drainage catheter lies within the mid to upper portion of this collection. Small subcapsular collection lies along the right liver margin. A subcapsular component of the larger collection lies along the anterior inferior aspect of the right liver lobe. Fluid is also seen distending the  posterior pelvic recess. Remaining colon shows a generalized mild increase in the colonic stool burden, but no wall thickening or convincing inflammation. There is no small bowel dilation to suggest obstruction.  Stomach is moderately distended, but without wall thickening. Tiny bubbles of free air lie just deep to the upper abdominal incision line. Vascular/Lymphatic: Well-positioned vena cava filter. Aortic atherosclerosis. Prominent gastrohepatic ligament lymph nodes. Mesenteric nodes noted on the prior CT are less well-defined due to postoperative fluid/edema. Reproductive: Unremarkable. Other: None. Musculoskeletal: No fracture or acute finding. No osteoblastic or osteolytic lesions. IMPRESSION: 1. Status post abdominal surgery since the previous CT scan. Large irregular fluid collection in the right abdomen lies in the location of the previous seen large necrotic mass. Some of this collection may reflect the residual mass. Collection extends from the subcapsular inferior right liver to the right pelvis measuring 23 x 10 x 16 cm. Surgical drain lies within the upper to mid aspect of this collection. 2. 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 No evidence of bowel obstruction. 3. Findings of metastatic disease noted on the recent prior CT are unchanged. Electronically Signed   By: Amie Portland M.D.   On: 09/29/2022 16:02    Anti-infectives: Anti-infectives (From admission, onward)    Start     Dose/Rate Route Frequency Ordered Stop   09/30/22 0800  piperacillin-tazobactam (ZOSYN) IVPB 3.375 g        3.375 g 12.5 mL/hr over 240 Minutes Intravenous Every 8 hours 09/30/22 0701     09/20/22 1402  ceFAZolin (ANCEF) IVPB 2g/100 mL premix        over 30 Minutes Intravenous Continuous PRN 09/20/22 1411 09/20/22 1402   09/20/22 0600  piperacillin-tazobactam (ZOSYN) IVPB 3.375 g  Status:  Discontinued        3.375 g 12.5 mL/hr over 240 Minutes Intravenous Every 8 hours 09/19/22 1943 09/28/22 1134   09/19/22 1800  amoxicillin-clavulanate (AUGMENTIN) 875-125 MG per tablet 1 tablet  Status:  Discontinued        1 tablet Oral Every 12 hours 09/19/22 1659 09/19/22 1939        Assessment/Plan: POD 6 elap with right colectomy and end ileostomy- TC B cell lymphoma -NPO, continue TPN -ct reviewed with large collection in the right abdomen in surgical bed, drain is in this collection but still large -will ask IR for additional drain today- this may help with hiccups also -continue abx -continue wound care- hopefully this remains intact -recheck hb later today given drainage character although appears old   Emelia Loron 09/30/2022

## 2022-09-30 NOTE — Procedures (Signed)
Interventional Radiology Procedure Note  Date of Procedure: 09/30/2022  Procedure: CT drain placement   Findings:  1. CT drain placement, transgluteal 12 Fr to bulb    Complications: No immediate complications noted.   Estimated Blood Loss: minimal  Follow-up and Recommendations: 1. Flush x3 daily  2. Per surgery   Olive Bass, MD  Vascular & Interventional Radiology  09/30/2022 4:04 PM

## 2022-09-30 NOTE — Plan of Care (Signed)
  Problem: Education: Goal: Knowledge of General Education information will improve Description: Including pain rating scale, medication(s)/side effects and non-pharmacologic comfort measures Outcome: Progressing   Problem: Health Behavior/Discharge Planning: Goal: Ability to manage health-related needs will improve Outcome: Progressing   Problem: Clinical Measurements: Goal: Ability to maintain clinical measurements within normal limits will improve Outcome: Progressing Goal: Respiratory complications will improve Outcome: Progressing   Problem: Nutrition: Goal: Adequate nutrition will be maintained Outcome: Progressing   Problem: Coping: Goal: Level of anxiety will decrease Outcome: Progressing   Problem: Elimination: Goal: Will not experience complications related to urinary retention Outcome: Progressing   Problem: Pain Managment: Goal: General experience of comfort will improve Outcome: Progressing   Problem: Safety: Goal: Ability to remain free from injury will improve Outcome: Progressing   Problem: Skin Integrity: Goal: Risk for impaired skin integrity will decrease Outcome: Progressing   Problem: Education: Goal: Knowledge of the prescribed therapeutic regimen will improve Outcome: Progressing   Problem: Activity: Goal: Ability to implement measures to reduce episodes of fatigue will improve Outcome: Progressing   Problem: Coping: Goal: Ability to identify and develop effective coping behavior will improve Outcome: Progressing   Problem: Nutritional: Goal: Maintenance of adequate nutrition will improve Outcome: Progressing   Problem: Activity: Goal: Ability to tolerate increased activity will improve Outcome: Progressing   Problem: Respiratory: Goal: Ability to maintain a clear airway and adequate ventilation will improve Outcome: Progressing   Problem: Role Relationship: Goal: Method of communication will improve Outcome: Progressing    Problem: Education: Goal: Ability to describe self-care measures that may prevent or decrease complications (Diabetes Survival Skills Education) will improve Outcome: Progressing Goal: Individualized Educational Video(s) Outcome: Progressing   Problem: Coping: Goal: Ability to adjust to condition or change in health will improve Outcome: Progressing   Problem: Fluid Volume: Goal: Ability to maintain a balanced intake and output will improve Outcome: Progressing   Problem: Health Behavior/Discharge Planning: Goal: Ability to identify and utilize available resources and services will improve Outcome: Progressing Goal: Ability to manage health-related needs will improve Outcome: Progressing   Problem: Metabolic: Goal: Ability to maintain appropriate glucose levels will improve Outcome: Progressing   Problem: Nutritional: Goal: Maintenance of adequate nutrition will improve Outcome: Progressing Goal: Progress toward achieving an optimal weight will improve Outcome: Progressing   Problem: Skin Integrity: Goal: Risk for impaired skin integrity will decrease Outcome: Progressing   Problem: Tissue Perfusion: Goal: Adequacy of tissue perfusion will improve Outcome: Progressing   Problem: Education: Goal: Ability to manage disease process will improve Outcome: Progressing   Problem: Cardiac: Goal: Ability to achieve and maintain adequate cardiopulmonary perfusion will improve Outcome: Progressing   Problem: Neurologic: Goal: Promote progressive neurologic recovery Outcome: Progressing   Problem: Skin Integrity: Goal: Risk for impaired skin integrity will be minimized. Outcome: Progressing   Problem: Clinical Measurements: Goal: Will remain free from infection Outcome: Not Progressing Goal: Diagnostic test results will improve Outcome: Not Progressing Goal: Cardiovascular complication will be avoided Outcome: Not Progressing   Problem: Activity: Goal: Risk for  activity intolerance will decrease Outcome: Not Progressing   Problem: Elimination: Goal: Will not experience complications related to bowel motility Outcome: Not Progressing   Problem: Bowel/Gastric: Goal: Will not experience complications related to bowel motility Outcome: Not Progressing

## 2022-09-30 NOTE — Progress Notes (Signed)
PT Cancellation Note  Patient Details Name: Justin Cameron MRN: [id number] DOB: 01-09-1956   Cancelled Treatment:    Reason Eval/Treat Not Completed: Medical issues which prohibited therapy Spoke with RN.  Pt off floor for CT and with Red mews.  RN reports not appropriate for therapy today. Will f/u later date. Anise Salvo, PT Acute Rehab [facility] [phone]   Rayetta Humphrey 09/30/2022, 2:23 PM

## 2022-09-30 NOTE — Progress Notes (Signed)
Referring Physician(s): Darnelle Spangle  Supervising Physician: Pernell Dupre  Patient Status:  [facility] - In-pt  Chief Complaint: Lymphoma, postop abdominal abscess   Subjective: Pt known to IR team from port a cath placement on 09/20/22  and IVC filter placement on 09/25/22. He is a  67 y.o. male with PMH HLD, arthritis, OSA and newly diagnosed DLBCL who was admitted to [facility] from [facility] on 5/23 with persistent abd pain, fatigue, nausea, neck pain, lightheadedness, night sweats, weight loss, poor oral intake. He is s/p expl lap with rt hemicolectomy and end ileostomy on 5/28 for perforation of rt colon into RP due to extensive B cell lymphoma involvement . Also with hx BLE DVT. He now has increasing WBC up to 24.9, ongoing abd pain and latest CT A/P revealing:  1. Status post abdominal surgery since the previous CT scan. Large irregular fluid collection in the right abdomen lies in the location of the previous seen large necrotic mass. Some of this collection may reflect the residual mass. Collection extends from the subcapsular inferior right liver to the right pelvis measuring 23 x 10 x 16 cm. Surgical drain lies within the upper to mid aspect of this collection. 2. 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 No evidence of bowel obstruction. 3. Findings of metastatic disease noted on the recent prior CT are Unchanged   Request now received from [facility] for CT guided drainage of RLQ abscess. Latest temp 99.3, BP 118/59  HR 101; on IV Zosyn    Past Medical History:  Diagnosis Date   Arthritis    High cholesterol    Past Surgical History:  Procedure Laterality Date   IR IMAGING GUIDED PORT INSERTION  09/20/2022   IR IVC FILTER PLMT / S&I /IMG GUID/MOD SED  09/25/2022   LAPAROTOMY N/A 09/24/2022   Procedure: EXPLORATORY LAPAROTOMY, BOWEL RESECTION, ILEOSTOMY;  Surgeon: Harriette Bouillon, MD;  Location: [facility] ORS;  Service: General;   Laterality: N/A;   REPLACEMENT TOTAL KNEE  10/2018   ROTATOR CUFF REPAIR       Allergies: Patient has no allergy information on record.  Medications: Prior to Admission medications   Medication Sig Start Date End Date Taking? Authorizing Provider  ALPRAZolam (XANAX) 0.25 MG tablet Take 0.125-0.25 mg by mouth 3 (three) times daily as needed for anxiety.   Yes [provider]  colchicine 0.6 MG tablet Take 0.6 mg by mouth daily as needed (gout). 07/19/22  Yes [provider]  diphenhydramine-acetaminophen (TYLENOL PM) 25-500 MG TABS tablet Take 1 tablet by mouth at bedtime.   Yes [provider]  diphenhydrAMINE-zinc acetate (BENADRYL EXTRA STRENGTH) cream Apply 1 Application topically 3 (three) times daily as needed for itching. 09/18/22  Yes Charlynne Pander, MD  ondansetron (ZOFRAN) 4 MG tablet TAKE 1 TABLET(4 MG) BY MOUTH EVERY 8 HOURS AS NEEDED FOR NAUSEA OR VOMITING 08/19/22  Yes Jenel Lucks, MD  simvastatin (ZOCOR) 20 MG tablet Take 20 mg by mouth every evening.   Yes [provider]  traZODone (DESYREL) 50 MG tablet Take 50-100 mg by mouth at bedtime as needed for sleep. 07/26/22  Yes [provider]  amoxicillin-clavulanate (AUGMENTIN) 875-125 MG tablet Take 1 tablet by mouth every 12 (twelve) hours. 09/18/22   Charlynne Pander, MD  dicyclomine (BENTYL) 20 MG tablet TAKE 1 TABLET(20 MG) BY MOUTH EVERY 6 HOURS Patient not taking: Reported on 09/19/2022 08/19/22   Jenel Lucks, MD  Vital Signs: BP (!) 118/59   Pulse 88   Temp 99.3 F (37.4 C) (Axillary)   Resp 16   Ht 5\' 8"  (1.727 m)   Wt 200 lb 2.8 oz (90.8 kg)   SpO2 93%   BMI 30.44 kg/m   Physical Exam pt awake but lethargic; chest- sl dim BS bases; heart- tachy but regular rhythm; abd- dist,intact rt surgical drain , ostomy, midline vertical wound covered with bandage; trace pretibial edema bilat  Imaging: CT ABDOMEN PELVIS W CONTRAST  Result Date:  09/29/2022 CLINICAL DATA:  Abdominal pain.  Postop. EXAM: CT ABDOMEN AND PELVIS WITH CONTRAST TECHNIQUE: Multidetector CT imaging of the abdomen and pelvis was performed using the standard protocol following bolus administration of intravenous contrast. RADIATION DOSE REDUCTION: This exam was performed according to the departmental dose-optimization program which includes automated exposure control, adjustment of the mA and/or kV according to patient size and/or use of iterative reconstruction technique. CONTRAST:  80mL OMNIPAQUE IOHEXOL 300 MG/ML  SOLN COMPARISON:  09/18/2022. FINDINGS: Lower chest: Small right and trace left pleural effusions. Associated dependent lower lobe atelectasis, also greater on the right. Small focus of atelectasis at the base of the left upper lobe lingula. Hepatobiliary: Liver normal in size and attenuation. Multiple small low-attenuation liver masses consistent with cysts stable from the prior CT. Normal gallbladder. No bile duct dilation. Pancreas: Unremarkable. Spleen: Normal. Adrenals/Urinary Tract: Bilateral adrenal masses, unchanged from the recent prior CT, right 5.1 cm, 2 on the left 3.6 and 4.2 cm. Normal kidneys, ureters and bladder. Stomach/Bowel: Since the prior CT, abdominal surgery has been performed. There is a midline incision with the skin and subcutaneous fat open, fascia closed. Colon has been ligated at the level of the right mid transverse section. The ileum extends into a right anterior mid abdomen ostomy. There are postoperative collections. The largest is a heterogeneous right abdominal collection in the area of the necrotic mass. Soft tissue attenuation along some of the margins of this collection may reflect necrotic tissue or decompressed small bowel or a combination. The collection contains fluid and air. It measures approximately 23 cm from superior to inferior by 16 x 10 cm transversely. A drainage catheter lies within the mid to upper portion of this  collection. Small subcapsular collection lies along the right liver margin. A subcapsular component of the larger collection lies along the anterior inferior aspect of the right liver lobe. Fluid is also seen distending the posterior pelvic recess. Remaining colon shows a generalized mild increase in the colonic stool burden, but no wall thickening or convincing inflammation. There is no small bowel dilation to suggest obstruction. Stomach is moderately distended, but without wall thickening. Tiny bubbles of free air lie just deep to the upper abdominal incision line. Vascular/Lymphatic: Well-positioned vena cava filter. Aortic atherosclerosis. Prominent gastrohepatic ligament lymph nodes. Mesenteric nodes noted on the prior CT are less well-defined due to postoperative fluid/edema. Reproductive: Unremarkable. Other: None. Musculoskeletal: No fracture or acute finding. No osteoblastic or osteolytic lesions. IMPRESSION: 1. Status post abdominal surgery since the previous CT scan. Large irregular fluid collection in the right abdomen lies in the location of the previous seen large necrotic mass. Some of this collection may reflect the residual mass. Collection extends from the subcapsular inferior right liver to the right pelvis measuring 23 x 10 x 16 cm. Surgical drain lies within the upper to mid aspect of this collection. 2. 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 No evidence  of bowel obstruction. 3. Findings of metastatic disease noted on the recent prior CT are unchanged. Electronically Signed   By: Amie Portland M.D.   On: 09/29/2022 16:02    Labs:  CBC: Recent Labs    09/26/22 0826 09/26/22 1309 09/28/22 1640 09/29/22 0407 09/30/22 0357  WBC 14.2* 10.4  --  20.4* 24.9*  HGB 6.9* 7.2* 7.6* 7.8* 8.0*  HCT 22.5* 22.6* 24.3* 24.8* 25.3*  PLT 274 199  --  164 178    COAGS: Recent Labs    09/19/22 1713  INR 1.1  APTT 29    BMP: Recent Labs     09/28/22 1419 09/29/22 0407 09/29/22 0544 09/30/22 0357  NA 135 132* 137 134*  K 3.9  3.9 7.4* 3.6 4.1  CL 103 101 105 102  CO2 24 23 24 25   GLUCOSE 211* 889* 105* 142*  BUN 17 15 17 16   CALCIUM 7.3* 7.0* 7.7* 7.5*  CREATININE 0.69 0.71 0.64 0.67  GFRNONAA >60 >60 >60 >60    LIVER FUNCTION TESTS: Recent Labs    09/24/22 0127 09/25/22 0439 09/26/22 0407 09/28/22 1015 09/29/22 0407 09/30/22 0357  BILITOT 0.5 1.1 0.9  --   --  1.2  AST 33 29 26  --   --  27  ALT 23 18 17   --   --  15  ALKPHOS 69 44 47  --   --  105  PROT 6.6 5.1* 4.6*  --   --  5.1*  ALBUMIN 3.1* 2.6* 2.0* 2.1* 1.9* 2.0*    Assessment and Plan:  68 y.o. male with PMH HLD, arthritis, OSA and newly diagnosed DLBCL who was admitted to [facility] from [facility] on 5/23 with persistent abd pain, fatigue, nausea, neck pain, lightheadedness, night sweats, weight loss, poor oral intake. He is s/p expl lap with rt hemicolectomy and end ileostomy on 5/28 for perforation of rt colon into RP due to extensive B cell lymphoma involvement . Also with hx BLE DVT. He is s/p port a cath placement on 5/24 and IVC filter placement on 09/25/22. He now has increasing WBC up to 24.9, ongoing abd pain and latest CT A/P revealing:  1. Status post abdominal surgery since the previous CT scan. Large irregular fluid collection in the right abdomen lies in the location of the previous seen large necrotic mass. Some of this collection may reflect the residual mass. Collection extends from the subcapsular inferior right liver to the right pelvis measuring 23 x 10 x 16 cm. Surgical drain lies within the upper to mid aspect of this collection. 2. 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 No evidence of bowel obstruction. 3. Findings of metastatic disease noted on the recent prior CT are unchanged   Request now received from [facility] for CT guided drainage of RLQ abscess. Latest temp 99.3, BP 118/59   HR 101; on IV Zosyn; imaging studies were reviewed by Dr. Juliette Alcide. Risks and benefits discussed with the patient /spouse including bleeding, infection, damage to adjacent structures, bowel perforation/fistula connection, and sepsis.  All of the patient's questions were answered, patient is agreeable to proceed. Consent signed and in chart. Procedure scheduled for today.     Electronically Signed: D. Jeananne Rama, PA-C 09/30/2022, 10:17 AM   I spent a total of 20 minutes at the the patient's bedside AND on the patient's hospital floor or unit, greater than 50% of which was counseling/coordinating care for CT guided right abdominal  abscess drain    Patient ID: Justin Cameron, male   DOB: 12/30/1955, 67 y.o.   MRN: [id number]

## 2022-09-30 NOTE — Progress Notes (Signed)
[facility] PROGRESS NOTE  HARLEN TAIT (DOB: 27-Feb-1956) VWU:[id number] PCP: Daisy Floro, MD Outpatient Specialists: Oncology, Dr. Candise Che  Brief Narrative: Justin Cameron is a 67 y.o. male with a history of OSA, newly diagnosed metastatic DLBCL who [facility] for ongoing abdominal pain, bloating, unintentional weight loss, night sweats. He had been seen in the ED 5/22 where CT showed an enlarging mass at the hepatic flexure, increased pericolonic ft stranding, increased mesenteric lymphadenopathy, enlarging bilateral adrenal masses, and new mass along crus of left diaphragm. There were also new and enlarging LLL pulmonary nodules. His symptoms were stable with IVF and he was discharged, but on return to the oncology clinic his symptoms seemed worse. He was referred for direct admission to expedite chemotherapy. Port placed 5/24, started rituximab with plans to stepwise initiate chemotherapy. On 5/28 he developed an acute abdomen, taken for ex lap where perforation was confirmed, right hemicolectomy and end ileostomy placed with JP drain. Extubated the following day. Repeat CT 6/2 showed large right abdominal fluid collection for which additional drain placement by IR is requested. Continues on TPN.   Subjective: Having significant pain with any coughing, splinting with pillow. Midabdominal pain is severe. Very smelly in the room, disconcerting to pt. Spouse at bedside.  Objective: BP (!) 118/56 (BP Location: Left Wrist)   Pulse (!) 106   Temp 99.3 F (37.4 C) (Axillary)   Resp 19   Ht 5\' 8"  (1.727 m)   Wt 90.8 kg   SpO2 96%   BMI 30.44 kg/m   Gen: No distress Pulm: Clear, nonlabored anterolaterally, diminished globally.  CV: Regular, borderline tachycardia, no JVD.  GI: Soft, tendered, distended without rebound. Neuro: Alert and oriented. No new focal deficits. Ext: Warm, no deformities. 1+ dependent edema. Skin: No other/new rashes,  lesions or ulcers on visualized skin. Port not accessed, site c/d/I with dermabond, bandaid at IJ insertion site, no discharge. RUE PICC c/d/I. Midline abdominal incision dressing c/d/I. Dark stool in ostomy bag and liquid black/green output in JP drain.   Assessment & Plan: DLBCL Stage IV:  - Port 5/24, started rituximab 5/24. EPOCH chemotherapy will need to be held currently. Initiation will depend on clinical progress and whether he needs surgery (would delay weeks) per my discussion with Dr. Candise Che. - Oncology recommended against pharmacologic VTE ppx due to risk of bleeding, so encouraged patient to ambulate often, SCDs - Adrenal masses noted. No specific symptoms/signs of insufficiency, and AM cortisol adequate at 16.3.  - Pre Tx PET scan ordered (inpt vs. outpatient) - Pre Tx echocardiogram appears normal, no cardiomyopathy.   Colonic perforation, partial obstruction: s/p ex lap with ileostomy diversion and right hemicolectomy: Complicated by intraabdominal abscess.  - Continue zosyn for now - Aspiration per IR requested per surgery, keep NPO - Stay on TPN - Continue pain control as ordered.   Acute hypoxic respiratory failure: Splinting due to pain and abdominal distention contributing. Pt has no chest pain, though he has malignancy and is hospitalized with holding of anticoagulation per oncology due to risk of hemorrhage of mass(es), he has been getting up frequently and has no signs/symptoms of DVT on exam. Echo was normal and euvolemic by exam - ABG reassuring. - No wheezing. No infiltrate, large effusion, PTX on CXR.  - LE venous U/S, would need IVC filter if positive for DVT.  - Continue supplemental oxygen to maintain normal work of breathing and SpO2 >89%. May be stabilizing in ICU. Monitor closely.  -  Echo looked ok, having edema, though hemodynamically tenuous. Will initiate diuresis once more stable.   Symptomatic acute blood loss anemia: Hgb stable this AM. VSS.  - Serial  H/H, update T&S  Acute metabolic encephalopathy: Improving.  - Delirium precautions, spouse at bedside.   Postoperative shock: Resolved, no longer on pressors  New onset atrial fibrillation:  - Continue amiodarone gtt while NPO, plan to convert to po with plans for short term Tx given the significant stress under which this developed.  - No AC with risk of hemorrhagic complications from tumors.   Bilateral LE DVTs:  - IVC filter placed - Consider anticoagulation once felt to be safe per oncology, surgery.   Acute hypoxic respiratory failure: Multifactorial - Continue weaning effort, currently 4L O2. Continue IS as much as possible. Splinting is contributing. Possibly PE as well, though unable to anticoagulate.  - Encourage CPAP qHS if he'll allow.  Hyponatremia, dehydration, lactic acidosis: Resolved.   Moderate protein calorie malnutrition:  - Supplement protein as able  History of gout:  - Started allopurinol, uric acid level < 6. Remains at risk of TLS, will need to consider alternative agents (e.g. rasburicase), but defer to oncology at this time (touched base with Dr. Candise Che by chat).   Insomnia: Worsened by hospitalization and steroids.Seemed improved with trazodone, melatonin, holding now.   OSA:  - CPAP qHS recommended.  Tyrone Nine, MD [facility] [email or web address] 09/30/2022, 9:03 AM

## 2022-09-30 NOTE — Progress Notes (Signed)
Pharmacy Antibiotic Note  Justin Cameron is a 67 y.o. male admitted on 09/19/2022 with sepsis.  Pharmacy has been consulted for vanc dosing to add to Zosyn patient is already getting.  Plan: Vanc 2g IV x 1 then 1750mg  IV q24 - goal AUC 400-550  Height: 5\' 8"  (172.7 cm) Weight: 90.8 kg (200 lb 2.8 oz) IBW/kg (Calculated) : 68.4  Temp (24hrs), Avg:100.7 F (38.2 C), Min:99.3 F (37.4 C), Max:103 F (39.4 C)  Recent Labs  Lab 09/24/22 1240 09/24/22 1335 09/24/22 1825 09/25/22 0439 09/26/22 0008 09/26/22 0407 09/26/22 0826 09/26/22 1309 09/26/22 1841 09/28/22 1419 09/29/22 0407 09/29/22 0544 09/30/22 0357 09/30/22 1148 09/30/22 1150  WBC  --   --  42.6* 36.9* 10.6*   < > 14.2* 10.4  --   --  20.4*  --  24.9*  --  25.9*  CREATININE  --    < > 1.53* 1.34*  --    < >  --   --    < > 0.69 0.71 0.64 0.67 0.66  --   LATICACIDVEN 3.8*  --  3.7* 3.7* 2.1*  --   --   --   --   --   --   --   --   --   --    < > = values in this interval not displayed.    Estimated Creatinine Clearance: 99.4 mL/min (by C-G formula based on SCr of 0.66 mg/dL).    Not on File   Thank you for allowing pharmacy to be a part of this patient's care.  Berkley Harvey 09/30/2022 5:45 PM

## 2022-09-30 NOTE — Procedures (Signed)
Intubation Procedure Note  Justin Cameron  [id number]  07-24-1955  Date:09/30/22  Time:5:11 PM   Provider Performing: Direct assistance of NP student Lawyer Washabaugh D. Harris    Procedure: Intubation (31500)  Indication(s) Respiratory Failure  Consent Risks of the procedure as well as the alternatives and risks of each were explained to the patient and/or caregiver.  Consent for the procedure was obtained and is signed in the bedside chart   Anesthesia Etomidate, Versed, Fentanyl, and Rocuronium   Time Out Verified patient identification, verified procedure, site/side was marked, verified correct patient position, special equipment/implants available, medications/allergies/relevant history reviewed, required imaging and test results available.   Sterile Technique Usual hand hygeine, masks, and gloves were used   Procedure Description Patient positioned in bed supine.  Sedation given as noted above.  Patient was intubated with endotracheal tube using Glidescope.  View was Grade 1 full glottis .  Number of attempts was 1.  Colorimetric CO2 detector was consistent with tracheal placement.   Complications/Tolerance None; patient tolerated the procedure well. Chest X-ray is ordered to verify placement.   EBL Minimal   Specimen(s) None   Javel Hersh D. Harris, NP-C [facility] Personal contact information can be found on Amion  If no contact or response made please call [phone] 09/30/2022, 5:11 PM

## 2022-09-30 NOTE — Progress Notes (Addendum)
eLink Physician-Brief Progress Note Patient Name: Justin Cameron DOB: March 18, 1956 MRN: [id number]   Date of Service  09/30/2022  HPI/Events of Note  67 year old male that initially presented with a new diagnosis of stage IV B-cell lymphoma with hepatic flexure mass and adrenal mass with concern for intra-abdominal infection.  Initiated chemotherapy on 5/23.  Eventually on 6/2 developed fevers, leukocytosis and there is a fluid-filled collection where the necrotic mass was located.  IR guided drain was placed, but the patient decompensated and developed multi pressor shock and respiratory failure.  He is 2.6 L positive after resuscitation, scheduled to receive TPN.  Currently on norepinephrine and vasopressin.  On broad-spectrum antibiotics with vancomycin and Zosyn.  eICU Interventions  Change insulin to every 4 hours.  Maintain TPN.  Discontinue Dilaudid in the setting of as needed fentanyl.  GI prophylaxis with pantoprazole, discontinue famotidine.  No arterial line in place currently, would likely benefit from this.  Next pressor would be epinephrine, discontinue phenylephrine  Repeat VBG   2254 -markedly improved arterial gas..  Improved ventilation, oxygenation stable, can titrate FiO2 down to maintain SpO2 greater than 90%.   Intervention Category Major Interventions: Shock - evaluation and management  Juandedios Dudash 09/30/2022, 8:44 PM

## 2022-09-30 NOTE — Consult Note (Signed)
WOC Nurse ostomy consult note Stoma type/location: RLQ ileostomy Stomal assessment/size: 1 and 1/2 inch slightly oval Peristomal assessment: intact, some evidence of MARSI from 6-8 o'clock due to abdominal distention Treatment options for stomal/peristomal skin: skin barrier ring, liquid barrier film over area of MARSI Output : of brown/green effluent in pouch. In creased drainage from JP drain in area. Ostomy pouching: 2pc. 2 and 3/4 inch pouch ing system with skin barrier ring. Skin barrier is cut off center and trimmed slightly at midline to avoid placement into midline incision  Education provided: Support provided to wife who is emotional this morning. It is the patient's birthday, their wedding anniversary is in 5 days, the cancer diagnosis is still new, their 23 year old daughter made a visit over the weekend and it was upsetting for her, etc. The patient's wife is processing the results/information from the CT scan performed yesterday.  Peri Maris, PA-C from Interventional Radiology was in during my visit and explained the process of the drain placement, that there is an abscess in the abdomen (previously the patient's wife had only heard the word "fluid") and is now concerned that there is infection. Patient relayed to his wife that he "cannot take" the odor from the drains.  I place patient's ostomy pouch discussing/demonstrating steps with wife as I perform. She voices familiarity. I provide an additional. Pouch for patient to take to IR in the event that drain is under the footprint of the pouch and has to be removed/replaced.   Enrolled patient in [place] Secure Start D/C program: Yes, previously. Patient's wife relays that the Welcome kit has been received at their residence. I inform her that if the representatives contact her while her husband is stili in the hospital she should let them know that she will call them when they are discharged.   WOC nursing team will follow, and  will remain available to this patient, the nursing and medical teams.   Thank you for inviting [place] to participate in this patient's Plan of Care.  Ladona Mow, MSN, RN, CNS, GNP, Leda Min, [employer], [employer] phone:  [phone]

## 2022-09-30 NOTE — TOC Progression Note (Signed)
Transition of Care [facility]) - Progression Note    Patient Details  Name: Justin Cameron MRN: [id number] Date of Birth: 1955-11-11  Transition of [facility] CM/SW Contact  Coralyn Helling, [place] Phone Number: 09/30/2022, 11:23 AM  Clinical Narrative:   TOC spoke with patient spouse at bedside. Spouse reports patient was independent prior to hospitalization. She reports patient was not driving due to weakness. If recommendation continues at dc, patient and family agreeable to HHPT. TOC will follow and arrange closer to dc.    PLAN: HHPT      Expected Discharge Plan: Home/Self Care    Expected Discharge Plan and Services                                               Social Determinants of Health (SDOH) Interventions SDOH Screenings   Food Insecurity: No Food Insecurity (09/23/2022)  Housing: Low Risk  (09/23/2022)  Transportation Needs: No Transportation Needs (09/23/2022)  Utilities: Not At Risk (09/23/2022)  Tobacco Use: Low Risk  (09/25/2022)    Readmission Risk Interventions    09/20/2022    1:26 PM  Readmission Risk Prevention Plan  Transportation Screening Complete  PCP or Specialist Appt within 3-5 Days Complete  HRI or Home Care Consult Complete  Social Work Consult for Recovery Care Planning/Counseling Complete  Palliative Care Screening Not Applicable  Medication Review Oceanographer) Complete

## 2022-09-30 NOTE — Progress Notes (Signed)
CH responded to page for patient and family. Recent and rapid change in health. PCG/spouse was informed of patient's decline in health. Spiritual care will be provided through the ministry of presence, building rapport and prayer if requested. Members of patient/PCG's emotional support system are aware of patient's health and are present for support.   Rev. Casilda Carls, M.Div. Healthcare Chaplain

## 2022-09-30 NOTE — Progress Notes (Signed)
Called to bedside for patient growing rapidly unstable. On returning from CT-guided drain placement in IR this afternoon, the patient was encephalopathic and severely febrile, tachycardic, and hypotensive. Wife and family friend at bedside confirming full code, green light on continued aggressive measures. CCM also at bedside taking part in conversation. This patient's goals of care are clear, and his previous functional status was very good. There was plan for chemotherapy per Dr. Candise Che with curative intent. Unfortunately his hospitalization has been complicated by severe, life-threatening perforation and now recurrent septic shock. Given his recurrent decompensation, we have let family know his prognosis is worse. We could consider palliative care consultation to add a layer of support going forward.  We will give fluids, start levophed for MAP >65, and intubate shortly. CCM will become primary, though I will be here in any way that I may be of help to this sweet family and the critical care service.   Hazeline Junker, MD 09/30/2022 4:54 PM

## 2022-09-30 NOTE — Progress Notes (Signed)
RT sent ABG to lab at this time. Lab aware.

## 2022-09-30 NOTE — Progress Notes (Addendum)
PHARMACY - TOTAL PARENTERAL NUTRITION CONSULT NOTE   Indication: Prolonged ileus  Patient Measurements: Height: 5\' 8"  (172.7 cm) Weight: 90.8 kg (200 lb 2.8 oz) IBW/kg (Calculated) : 68.4 TPN AdjBW (KG): 81 Body mass index is 30.44 kg/m. 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  He was supposed to start chemo, but developed severe acute abdominal pain on 5/28.  NG tube placed, imaging to confirm placement shows possible free air and he was taken to OR on 5/28. Pharmacy consulted to dose TPN for prolonged ileus.    Glucose / Insulin: No hx DM, no PTA medications.   - CBGs < 150 last 24 hrs - 2u SSI Electrolytes: improving, corrected Ca 9.1 Renal: stable Hepatic: AST/ALT, Tbili WNL. TG previously with slight elevation in setting of propofol Intake / Output: -mIVF LR at 40 mL/hr -Output: Drain 635 mL, Urine 900 mL GI Imaging:  -6/2 CT abd/pelvis - large irregular fluid collection in the right abdomen lies in the location of the previous seen large necrotic mass -5/28  Lucency in the right upper quadrant was not seen on the previous images. Free intraperitoneal air is not excluded.  GI Surgeries / Procedures:  -5/28 Exploratory laparotomy with right hemicolectomy and end ileostomy   Central access: Port (09/20/22), Triple lumen PICC placed 5/29 TPN start date: 5/29  Nutritional Goals: Goal TPN rate of 100 mL/hr provides 127 g of protein and 2453 kcals per day  RD Assessment: Estimated Needs Total Energy Estimated Needs: 2400-2600 kcals Total Protein Estimated Needs: 120-130 gm Total Fluid Estimated Needs: >/= 2.4 L  Current Nutrition: NPO 6/3  Plan:  -Increase TPN to goal 100 mL/hr at 1800 -Electrolytes in TPN: Na 90 mEq/L, K 40 mEq/L, Ca 0 mEq/L, Mg 3 mEq/L, and Phos 15 mmol/L. Cl:Ac 1:1 -Add standard MVI and trace  elements to TPN -Thiamine 100 mg IV daily x 5 days through 6/3 -Continue sensitive SSI q6h and adjust as needed  -Stop MIVF at 1800 once TPN at goal -Monitor TPN labs on Mon/Thurs, daily until at goal   Pricilla Riffle, PharmD, BCPS Clinical Pharmacist 09/30/2022 9:37 AM

## 2022-09-30 NOTE — Progress Notes (Cosign Needed)
NAME:  Justin Cameron, MRN:  [id number], DOB:  09-20-1955, LOS: 11 ADMISSION DATE:  09/19/2022, CONSULTATION DATE:  09/24/22 REFERRING MD:  Jarvis Newcomer, CHIEF COMPLAINT:  Abd pain   History of Present Illness:  67 year old man w/ hx of OSA, recently diagnosed large B cell lymphoma causing threatened large bowel obstruction.  Came in after chemo b/c more bloating/pain.  NGT placed, O2 needs went from RA to 15LPM so PCCM consulted.  Denies any cough.  +dyspneic with minimal exertion.  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  Significant Hospital Events: Including procedures, antibiotic start and stop dates in addition to other pertinent events   09/24/22 Exploratory laparotomy with right hemicolectomy and end ileostomy, JP 5/29 Extubated after surgery  6/3 IR CT guided drainage of RLQ abscess, septic shock/rigors after, respiratory distress, intubated and started on pressors   Interim History / Subjective:  Patient in respiratory distress, lethargic, unable to follow commands    Objective   Blood pressure (!) 231/67, pulse (!) 151, temperature (!) 100.8 F (38.2 C), temperature source Axillary, resp. rate (!) 30, height 5\' 8"  (1.727 m), weight 90.8 kg, SpO2 100 %.    Vent Mode: PRVC FiO2 (%):  [100 %] 100 % Set Rate:  [16 bmp] 16 bmp Vt Set:  [540 mL] 540 mL PEEP:  [5 cmH20] 5 cmH20 Plateau Pressure:  [17 cmH20] 17 cmH20   Intake/Output Summary (Last 24 hours) at 09/30/2022 1701 Last data filed at 09/30/2022 1200 Gross per 24 hour  Intake 2622.79 ml  Output 2635 ml  Net -12.21 ml    Filed Weights   09/28/22 0439 09/29/22 0450 09/30/22 0500  Weight: 91.3 kg 92.3 kg 90.8 kg    Examination: General: chronically ill appearing adult, lying in ICU bed  HENT: Normocephalic, dry pale mm, poor dentition  Cardio: s1, s2, auscultated, irregular rhythm, tachy, no m/r/g Pulm: Diminished throughout lung fields, tachypnea, respiratory  distress, accessory muscle use  Abd: midline surgical incision with abdominal dressing. 2 right upper to mid quadrant JP drains (sanguineous dark output), ileostomy  GU: Foley in place Skin: cool, clammy, surgical incisions (abdominal pain)  Extremities: generalized edema, 1+, moves extremities to pain Neuro: PERRLA intact sluggish, responds only to pain, lethargic    Resolved Hospital Problem list   Postoperative vent management-extubated successfully  Assessment & Plan:  Septic Shock secondary to intraabdominal RLQ abscess in setting of diffuse large Bcell lymphoma mass with perforation status post ex lap 5/28 Exploratory laparotomy with right hemicolectomy and end ileostomy along with JP drain placement. 6/3 CT guided JP drain placed by IR on 6/3 in RLQ abscess measuring 23 x 10 x16 cm After placement, patient developed rigors and septic shock (febrile, hypotensive, AMS)  Temperature >104 P:  Start IV antibiotics vanc and continue Zosyn  1LR bolus MAP> 65 goal, start levophed and vasopressin  May consider Aline if increasing pressor requirements  2 AMPs of bicarb  Obtain / Trend lactic acid, DIC panel, CBC with diff, CMET  ABG STAT, 1 hour after intubation  May need to start Bicarb gtt  Monitor urine output Strict I/O  CVP q 4 Hold of steroids for now due to recent surgical procedures Insert foley  Start cooling via [employer], goal toward normothermia   Acute hypoxic and hypercarbic respiratory failure in setting of septic shock  Sleep Apnea hx  Pt in acute respiratory distress, tachypnea (RR >50s), accessory muscle use  P: STAT intubation, discussed with wife and family at beside  Continue ventilator support with lung protective strategies  Wean PEEP and FiO2 for sats greater than 90%. Head of bed elevated 30 degrees. Plateau pressures less than 30 cm H20.  Follow intermittent chest x-ray and ABG.   SAT/SBT starting tomorrow if tolerated Ensure adequate pulmonary  hygiene  VAP bundle in place along with PAD protocol Start Fentanyl and Precedex gtt, prn versed pushes    Acute Metabolic Encephalopathy multifactorial Hypoxia, septic shock, delirium P: Continue supportive care Initiate delirium precautions when appropriate  If possible limit use of sedating meds while intubated   Anemia in setting of s/p exlap and drain placement  Surgical jp drains output-dark sanguineous fluid, >1LR P: STAT CBC, trend  Transfuse <7  Continue to monitor output from JP drains Monitor for signs of bleeding   DVT Left Lower Extremity s/p IVC  IVC placed on 5/29  Not candidate at this time for anticoagulation per oncology  P:  Continue supportive care  Continue utilization of SCDS   Atrial Fib RVR Previously controlled  Suspecting uncontrolled due to septic shock and hypovolemia  P: Continue IV amio, may consider amio bolus if rate increases  Continue to monitor   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  Hx of gout  P: Continue po allopurinol  Oncology following and appreciate assistance Monitor daily CMETs for electrolyte imbalances   Severe Protein Malnutrition  P:  Continue TPN, appreciate pharmacy asssitance  Q 4 CBGs   Best Practice (right click and "Reselect all SmartList Selections" daily)   Best practice:  Diet: TPN DVT prophylaxis: hold for now, SCDs  GI prophylaxis: PPI Code Status: full  Family Communication: updated at bedside Disposition: To stepdown status on 5/31  CRITICAL CARE Performed by: Rochel Brome S-ACNP    Total critical care time: 50 minutes  Critical care time was exclusive of separately billable procedures and treating other patients.  Critical care was necessary to treat or prevent imminent or life-threatening deterioration.  Critical care was time spent personally by me on the following activities: development of treatment plan with patient and/or surrogate as well as nursing, discussions with consultants, evaluation of  patient's response to treatment, examination of patient, obtaining history from patient or surrogate, ordering and performing treatments and interventions, ordering and review of laboratory studies, ordering and review of radiographic studies, pulse oximetry and re-evaluation of patient's condition.

## 2022-10-01 DIAGNOSIS — C8330 Diffuse large B-cell lymphoma, unspecified site: Secondary | ICD-10-CM | POA: Diagnosis not present

## 2022-10-01 DIAGNOSIS — A419 Sepsis, unspecified organism: Secondary | ICD-10-CM | POA: Diagnosis not present

## 2022-10-01 DIAGNOSIS — R6521 Severe sepsis with septic shock: Secondary | ICD-10-CM

## 2022-10-01 DIAGNOSIS — E43 Unspecified severe protein-calorie malnutrition: Secondary | ICD-10-CM

## 2022-10-01 LAB — CBC
HCT: 27.6 % — ABNORMAL LOW (ref 39.0–52.0)
Hemoglobin: 8.4 g/dL — ABNORMAL LOW (ref 13.0–17.0)
MCH: 25.2 pg — ABNORMAL LOW (ref 26.0–34.0)
MCHC: 30.4 g/dL (ref 30.0–36.0)
MCV: 82.9 fL (ref 80.0–100.0)
Platelets: 212 10*3/uL (ref 150–400)
RBC: 3.33 MIL/uL — ABNORMAL LOW (ref 4.22–5.81)
RDW: 18.5 % — ABNORMAL HIGH (ref 11.5–15.5)
WBC: 31 10*3/uL — ABNORMAL HIGH (ref 4.0–10.5)
nRBC: 0 % (ref 0.0–0.2)

## 2022-10-01 LAB — BLOOD GAS, ARTERIAL
Acid-Base Excess: 1.1 mmol/L (ref 0.0–2.0)
Bicarbonate: 27.1 mmol/L (ref 20.0–28.0)
Drawn by: 331471
Mode: POSITIVE
O2 Saturation: 100 %
Patient temperature: 37
pCO2 arterial: 49 mmHg — ABNORMAL HIGH (ref 32–48)
pH, Arterial: 7.35 (ref 7.35–7.45)
pO2, Arterial: 147 mmHg — ABNORMAL HIGH (ref 83–108)

## 2022-10-01 LAB — DIFFERENTIAL
Abs Immature Granulocytes: 1.38 10*3/uL — ABNORMAL HIGH (ref 0.00–0.07)
Basophils Absolute: 0.1 10*3/uL (ref 0.0–0.1)
Basophils Relative: 0 %
Eosinophils Absolute: 0.6 10*3/uL — ABNORMAL HIGH (ref 0.0–0.5)
Eosinophils Relative: 2 %
Immature Granulocytes: 5 %
Lymphocytes Relative: 1 %
Lymphs Abs: 0.3 10*3/uL — ABNORMAL LOW (ref 0.7–4.0)
Monocytes Absolute: 0.8 10*3/uL (ref 0.1–1.0)
Monocytes Relative: 2 %
Neutro Abs: 27.9 10*3/uL — ABNORMAL HIGH (ref 1.7–7.7)
Neutrophils Relative %: 90 %

## 2022-10-01 LAB — CBC WITH DIFFERENTIAL/PLATELET
Abs Immature Granulocytes: UNDETERMINED 10*3/uL (ref 0.00–0.07)
Band Neutrophils: UNDETERMINED %
Basophils Absolute: UNDETERMINED 10*3/uL (ref 0.0–0.1)
Basophils Relative: UNDETERMINED %
Blasts: UNDETERMINED %
Eosinophils Absolute: UNDETERMINED 10*3/uL (ref 0.0–0.5)
Eosinophils Relative: UNDETERMINED %
HCT: UNDETERMINED % (ref 39.0–52.0)
Hemoglobin: UNDETERMINED g/dL (ref 13.0–17.0)
Immature Granulocytes: UNDETERMINED %
Lymphocytes Relative: UNDETERMINED %
Lymphs Abs: UNDETERMINED 10*3/uL (ref 0.7–4.0)
MCH: UNDETERMINED pg (ref 26.0–34.0)
MCHC: UNDETERMINED g/dL (ref 30.0–36.0)
MCV: UNDETERMINED fL (ref 80.0–100.0)
Metamyelocytes Relative: UNDETERMINED %
Monocytes Absolute: UNDETERMINED 10*3/uL (ref 0.1–1.0)
Monocytes Relative: UNDETERMINED %
Myelocytes: UNDETERMINED %
Neutro Abs: UNDETERMINED 10*3/uL (ref 1.7–7.7)
Neutrophils Relative %: UNDETERMINED %
Other: UNDETERMINED %
Platelets: UNDETERMINED 10*3/uL (ref 150–400)
Promyelocytes Relative: UNDETERMINED %
RBC Morphology: UNDETERMINED
RBC: UNDETERMINED MIL/uL (ref 4.22–5.81)
RDW: UNDETERMINED % (ref 11.5–15.5)
Smear Review: UNDETERMINED
WBC Morphology: UNDETERMINED
WBC: UNDETERMINED 10*3/uL (ref 4.0–10.5)
nRBC: UNDETERMINED % (ref 0.0–0.2)
nRBC: UNDETERMINED /100 WBC

## 2022-10-01 LAB — LACTIC ACID, PLASMA: Lactic Acid, Venous: 2.3 mmol/L (ref 0.5–1.9)

## 2022-10-01 LAB — GLUCOSE, CAPILLARY
Glucose-Capillary: 206 mg/dL — ABNORMAL HIGH (ref 70–99)
Glucose-Capillary: 182 mg/dL — ABNORMAL HIGH (ref 70–99)
Glucose-Capillary: 180 mg/dL — ABNORMAL HIGH (ref 70–99)
Glucose-Capillary: 165 mg/dL — ABNORMAL HIGH (ref 70–99)
Glucose-Capillary: 150 mg/dL — ABNORMAL HIGH (ref 70–99)
Glucose-Capillary: 143 mg/dL — ABNORMAL HIGH (ref 70–99)

## 2022-10-01 LAB — COMPREHENSIVE METABOLIC PANEL
ALT: UNDETERMINED U/L (ref 0–44)
ALT: 13 U/L (ref 0–44)
AST: UNDETERMINED U/L (ref 15–41)
AST: 27 U/L (ref 15–41)
Albumin: UNDETERMINED g/dL (ref 3.5–5.0)
Albumin: 1.6 g/dL — ABNORMAL LOW (ref 3.5–5.0)
Alkaline Phosphatase: UNDETERMINED U/L (ref 38–126)
Alkaline Phosphatase: 113 U/L (ref 38–126)
Anion gap: UNDETERMINED (ref 5–15)
Anion gap: 10 (ref 5–15)
BUN: UNDETERMINED mg/dL (ref 8–23)
BUN: 26 mg/dL — ABNORMAL HIGH (ref 8–23)
CO2: UNDETERMINED mmol/L (ref 22–32)
CO2: 23 mmol/L (ref 22–32)
Calcium: UNDETERMINED mg/dL (ref 8.9–10.3)
Calcium: 6.7 mg/dL — ABNORMAL LOW (ref 8.9–10.3)
Chloride: UNDETERMINED mmol/L (ref 98–111)
Chloride: 105 mmol/L (ref 98–111)
Creatinine, Ser: UNDETERMINED mg/dL (ref 0.61–1.24)
Creatinine, Ser: 0.78 mg/dL (ref 0.61–1.24)
GFR calc Af Amer: UNDETERMINED mL/min (ref >60)
GFR, Estimated: UNDETERMINED mL/min (ref >60)
GFR, Estimated: >60 mL/min (ref >60)
Glucose, Bld: UNDETERMINED mg/dL (ref 70–99)
Glucose, Bld: 240 mg/dL — ABNORMAL HIGH (ref 70–99)
Potassium: UNDETERMINED mmol/L (ref 3.5–5.1)
Potassium: 3.6 mmol/L (ref 3.5–5.1)
Sodium: UNDETERMINED mmol/L (ref 135–145)
Sodium: 138 mmol/L (ref 135–145)
Total Bilirubin: UNDETERMINED mg/dL (ref 0.3–1.2)
Total Bilirubin: 0.9 mg/dL (ref 0.3–1.2)
Total Protein: UNDETERMINED g/dL (ref 6.5–8.1)
Total Protein: 4.2 g/dL — ABNORMAL LOW (ref 6.5–8.1)

## 2022-10-01 LAB — TRIGLYCERIDES
Triglycerides: UNDETERMINED mg/dL (ref <150)
Triglycerides: 201 mg/dL — ABNORMAL HIGH (ref <150)

## 2022-10-01 LAB — SURGICAL PATHOLOGY

## 2022-10-01 LAB — PHOSPHORUS
Phosphorus: UNDETERMINED mg/dL (ref 2.5–4.6)
Phosphorus: 4.2 mg/dL (ref 2.5–4.6)

## 2022-10-01 LAB — BODY FLUID CULTURE W GRAM STAIN

## 2022-10-01 LAB — MAGNESIUM
Magnesium: UNDETERMINED mg/dL (ref 1.7–2.4)
Magnesium: 1.7 mg/dL (ref 1.7–2.4)

## 2022-10-01 MED ORDER — PHENYLEPHRINE 80 MCG/ML (10ML) SYRINGE FOR IV PUSH (FOR BLOOD PRESSURE SUPPORT)
PREFILLED_SYRINGE | INTRAVENOUS | Status: AC
  Administered 2022-10-01: 320 ug via INTRAVENOUS
  Filled 2022-10-01: qty 10

## 2022-10-01 MED ORDER — NAPHAZOLINE-GLYCERIN 0.012-0.25 % OP SOLN
1.0000 [drp] | Freq: Four times a day (QID) | OPHTHALMIC | Status: DC | PRN
  Administered 2022-10-01: 1 [drp] via OPHTHALMIC
  Administered 2022-10-02 (×2): 2 [drp] via OPHTHALMIC
  Filled 2022-10-01: qty 15

## 2022-10-01 MED ORDER — SODIUM CHLORIDE 0.9 % IV SOLN
1.0000 g | Freq: Three times a day (TID) | INTRAVENOUS | Status: DC
  Administered 2022-10-01 – 2022-10-03 (×5): 1 g via INTRAVENOUS
  Filled 2022-10-01 (×6): qty 20

## 2022-10-01 MED ORDER — ORAL CARE MOUTH RINSE
15.0000 mL | OROMUCOSAL | Status: DC
  Administered 2022-10-01 – 2022-10-03 (×32): 15 mL via OROMUCOSAL

## 2022-10-01 MED ORDER — ORAL CARE MOUTH RINSE: 15.0000 mL | OROMUCOSAL | Status: DC | PRN

## 2022-10-01 MED ORDER — EPINEPHRINE HCL 5 MG/250ML IV SOLN IN NS
INTRAVENOUS | Status: AC
  Filled 2022-10-01: qty 250

## 2022-10-01 MED ORDER — ACETAMINOPHEN 160 MG/5ML PO SOLN
650.0000 mg | Freq: Four times a day (QID) | ORAL | Status: DC | PRN
  Administered 2022-10-01 – 2022-10-11 (×9): 650 mg
  Filled 2022-10-01 (×8): qty 20.3

## 2022-10-01 MED ORDER — ALBUMIN HUMAN 25 % IV SOLN
12.5000 g | Freq: Once | INTRAVENOUS | Status: AC
  Administered 2022-10-01: 12.5 g via INTRAVENOUS
  Filled 2022-10-01: qty 50

## 2022-10-01 MED ORDER — NOREPINEPHRINE 16 MG/250ML-% IV SOLN
0.0000 ug/min | INTRAVENOUS | Status: DC
  Administered 2022-10-01: 25 ug/min via INTRAVENOUS
  Administered 2022-10-01: 34 ug/min via INTRAVENOUS
  Administered 2022-10-02: 21 ug/min via INTRAVENOUS
  Administered 2022-10-03: 8 ug/min via INTRAVENOUS
  Filled 2022-10-01 (×5): qty 250

## 2022-10-01 MED ORDER — TRACE MINERALS CU-MN-SE-ZN 300-55-60-3000 MCG/ML IV SOLN
INTRAVENOUS | Status: AC
  Filled 2022-10-01: qty 876

## 2022-10-01 MED ORDER — ALBUMIN HUMAN 5 % IV SOLN
25.0000 g | Freq: Once | INTRAVENOUS | Status: AC
  Administered 2022-10-01: 25 g via INTRAVENOUS
  Filled 2022-10-01: qty 500

## 2022-10-01 MED ORDER — ENOXAPARIN SODIUM 40 MG/0.4ML IJ SOSY
40.0000 mg | PREFILLED_SYRINGE | INTRAMUSCULAR | Status: DC
  Administered 2022-10-01 – 2022-10-06 (×6): 40 mg via SUBCUTANEOUS
  Filled 2022-10-01 (×6): qty 0.4

## 2022-10-01 MED ORDER — DEXMEDETOMIDINE HCL IN NACL 200 MCG/50ML IV SOLN
0.0000 ug/kg/h | INTRAVENOUS | Status: DC
  Administered 2022-10-01: 0.4 ug/kg/h via INTRAVENOUS
  Administered 2022-10-02: 0.5 ug/kg/h via INTRAVENOUS
  Administered 2022-10-02: 0.4 ug/kg/h via INTRAVENOUS
  Administered 2022-10-02 (×2): 0.5 ug/kg/h via INTRAVENOUS
  Administered 2022-10-02: 0.4 ug/kg/h via INTRAVENOUS
  Administered 2022-10-03: 0.6 ug/kg/h via INTRAVENOUS
  Administered 2022-10-03: 0.5 ug/kg/h via INTRAVENOUS
  Administered 2022-10-03: 0.2 ug/kg/h via INTRAVENOUS
  Filled 2022-10-01 (×9): qty 50

## 2022-10-01 NOTE — Progress Notes (Signed)
Chaplain checked in with nurse earlier today to offer support to Stella's family. No family was present at this time. Chaplains are available to be with family if needed.     10/01/22 1400  Spiritual Encounters  Type of Visit Initial  Care provided to: Pt not available  Conversation partners present during encounter Nurse

## 2022-10-01 NOTE — Progress Notes (Addendum)
Disregard original 10/01/22 labs for 0400 times; contaminated due to not stopping TPN for blood draw. Labs redrawn appropriately and reordered.

## 2022-10-01 NOTE — Progress Notes (Signed)
Nutrition Follow-up  DOCUMENTATION CODES:   Severe malnutrition in context of chronic illness  INTERVENTION:  - New TPN goal of 77mL/hr per pharmacy, provides: 131g protein, 342g dextrose, 540 IL kcals = 2228 kcals - TPN management per pharmacy.    - Monitor magnesium, potassium, and phosphorus BID for at least 3 days, MD to replete as needed.   - Monitor weight trends. Daily weights while on TPN.   NUTRITION DIAGNOSIS:   Severe Malnutrition related to chronic illness (non-Hodgkin's lymphoma) as evidenced by mild fat depletion, mild muscle depletion, energy intake < or equal to 75% for > or equal to 1 month, percent weight loss (11% in less than 1 month). *ongoing  GOAL:   Patient will meet greater than or equal to 90% of their needs *met with TPN  MONITOR:   Diet advancement, Labs, Weight trends, I & O's  REASON FOR ASSESSMENT:   Consult Assessment of nutrition requirement/status  ASSESSMENT:   67 y.o. male admits related to abdominal pain and poor oral intakes. PMH includes arthritis and high cholesterol. 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  5/23 Admit 5/28 ex-lap, right hemicolectomy and end ileostomy; TPN started 6/3 Intubated  Patient intubated and sedated at time of visit, no family at bedside. OGT in place to LIS.  Plan to continue TPN at this time. New goal rate of 55mL/hr as pharmacy working to concentrate TPN to help with volume status.    Admit weight: 178# Current weight: 200# Weight continues to trend up, patient is +12L and experiencing generalized edema which is likely affecting weight status.  Medications reviewed and include: Colace, Miralax, Insulin Fentanyl Levophed Vasopressin  Labs reviewed:  Triglycerides 201 (6/4)   Diet Order:   Diet Order             Diet NPO time specified  Diet effective now                   EDUCATION NEEDS:  Education needs have been  addressed  Skin:  Skin Assessment: Reviewed RN Assessment  Last BM:  6/4  Height:  Ht Readings from Last 1 Encounters:  09/24/22 5\' 8"  (1.727 m)   Weight:  Wt Readings from Last 1 Encounters:  09/30/22 90.8 kg   Ideal Body Weight:  70 kg  BMI:  Body mass index is 30.44 kg/m.  Estimated Nutritional Needs:  Kcal:  2250-2450 kcals Protein:  120-140 grams Fluid:  >/= 2.2L    Shelle Iron RD, LDN For contact information, refer to [facility].

## 2022-10-01 NOTE — Progress Notes (Signed)
PHARMACY - TOTAL PARENTERAL NUTRITION CONSULT NOTE   Indication: Prolonged ileus  Patient Measurements: Height: 5\' 8"  (172.7 cm) Weight:  (UTA at this time with accurate weight due to multiple devices on bed) IBW/kg (Calculated) : 68.4 TPN AdjBW (KG): 81 Body mass index is 30.44 kg/m.  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  He was supposed to start chemo, but developed severe acute abdominal pain on 5/28.  NG tube placed, imaging to confirm placement shows possible free air and he was taken to OR on 5/28. Pharmacy consulted to dose TPN for prolonged ileus.    CT with increased fluid collection in right abdomen around surgical drain. IR placed drain 6/3. Patient later intubated and started on vasopressors. Per discussion with CCM, patient with extravascular volume - patient given albumin 6/4.  Glucose / Insulin: No hx DM, no PTA medications.   - CBGs increasing last 24 hrs - 7u SSI Electrolytes: improved, corrected Ca 8.6 Renal: stable Hepatic: AST/ALT, Tbili WNL. TG 201 Intake / Output: -Drains 1390 mL -Urine 1875 mL GI Imaging:  -6/2 CT abd/pelvis - large irregular fluid collection in the right abdomen lies in the location of the previous seen large necrotic mass -5/28  Lucency in the right upper quadrant was not seen on the previous images. Free intraperitoneal air is not excluded.  GI Surgeries / Procedures:  -5/28 Exploratory laparotomy with right hemicolectomy and end ileostomy   Central access: Port (09/20/22), Triple lumen PICC placed 5/29 TPN start date: 5/29  Nutritional Goals: Goal TPN rate of 75 mL/hr provides 131 g of protein and 2228 kcals per day  RD Assessment: updated 6/4 following intubation Estimated Needs Total Energy Estimated Needs: 2250-2450 kcals Total Protein Estimated Needs: 120-140 grams Total Fluid Estimated  Needs: >/= 2.2L  Current Nutrition: NPO 6/3  Plan:  -Will attempt to concentrate TPN today to see if that may help with volume status  -Change TPN to 75 mL/hr at 1800 -Electrolytes in TPN: Na 90 mEq/L, K 60 mEq/L, Ca 3 mEq/L (add back today), Mg 5 mEq/L, and Phos 15 mmol/L. Cl:Ac 1:1 -Add standard MVI and trace elements to TPN -Thiamine 100 mg IV daily x 5 days through 6/3 -Continue sensitive SSI q6h and adjust as needed  -Stop MIVF at 1800 once TPN at goal -Monitor TPN labs on Mon/Thurs, daily until at goal   Pricilla Riffle, PharmD, BCPS Clinical Pharmacist 10/01/2022 11:17 AM

## 2022-10-01 NOTE — Progress Notes (Signed)
Referring Physician(s): Justin Cameron  Supervising Physician: Justin Cameron, Secondary school teacher  Patient Status:  [name] - In-pt  Chief Complaint: Abdominal pain/abscess/lymphoma   Subjective: Pt s/p placement of drainage cath into posterior pelvic fluid collection 09/30/22 with subsequent septic shock/bacteremia/SIRS afterwards; currently intubated; afebrile ; on vancomycin, vasopressin, zosyn, levophed, arctic sun; BP 118/53   Allergies: Patient has no allergy information on record.  Medications: Prior to Admission medications   Medication Sig Start Date End Date Taking? Authorizing Provider  ALPRAZolam (XANAX) 0.25 MG tablet Take 0.125-0.25 mg by mouth 3 (three) times daily as needed for anxiety.   Yes [provider]  colchicine 0.6 MG tablet Take 0.6 mg by mouth daily as needed (gout). 07/19/22  Yes [provider]  diphenhydramine-acetaminophen (TYLENOL PM) 25-500 MG TABS tablet Take 1 tablet by mouth at bedtime.   Yes [provider]  diphenhydrAMINE-zinc acetate (BENADRYL EXTRA STRENGTH) cream Apply 1 Application topically 3 (three) times daily as needed for itching. 09/18/22  Yes Charlynne Pander, MD  ondansetron (ZOFRAN) 4 MG tablet TAKE 1 TABLET(4 MG) BY MOUTH EVERY 8 HOURS AS NEEDED FOR NAUSEA OR VOMITING 08/19/22  Yes Jenel Lucks, MD  simvastatin (ZOCOR) 20 MG tablet Take 20 mg by mouth every evening.   Yes [provider]  traZODone (DESYREL) 50 MG tablet Take 50-100 mg by mouth at bedtime as needed for sleep. 07/26/22  Yes [provider]  amoxicillin-clavulanate (AUGMENTIN) 875-125 MG tablet Take 1 tablet by mouth every 12 (twelve) hours. 09/18/22   Charlynne Pander, MD  dicyclomine (BENTYL) 20 MG tablet TAKE 1 TABLET(20 MG) BY MOUTH EVERY 6 HOURS Patient not taking: Reported on 09/19/2022 08/19/22   Jenel Lucks, MD     Vital Signs: BP (!) 118/53 (BP Location: Left Arm)   Pulse 83   Temp 98.6 F (37 C)   Resp 16   Ht 5\' 8"   (1.727 m)   Wt 200 lb 2.8 oz (90.8 kg)   SpO2 99%   BMI 30.44 kg/m   Physical Exam intubated, rt abd surgical drain/TG drain in place with serosang fluid; outputs 783/265 cc past 24 hours  Imaging: DG CHEST PORT 1 VIEW  Result Date: 09/30/2022 CLINICAL DATA:  OG tube and endotracheal tube placement. EXAM: PORTABLE CHEST 1 VIEW COMPARISON:  09/24/2022 FINDINGS: An endotracheal tube with tip 2.5 cm above the carina, RIGHT PICC line with tip difficult to visualize but appears to overlie the RIGHT atrium and a RIGHT Port-A-Cath with tip overlying the RIGHT atrium as well. An enteric tube is noted with tip overlying the mid stomach. Bibasilar opacities/atelectasis noted. There is no evidence of pneumothorax or large pleural effusion. IMPRESSION: 1. RIGHT PICC line placement but otherwise unchanged appearance of the chest. Other support apparatus as described with mild bibasilar opacities/atelectasis. Electronically Signed   By: Harmon Pier M.D.   On: 09/30/2022 17:26   CT GUIDED PERITONEAL/RETROPERITONEAL FLUID DRAIN BY PERC CATH  Result Date: 09/30/2022 INDICATION: [id number] Postprocedural intraabdominal abscess [id number] EXAM: CT-guided pelvic drain placement TECHNIQUE: Multidetector CT imaging of the abdomen and pelvis was performed following the standard protocol without IV contrast. RADIATION DOSE REDUCTION: This exam was performed according to the departmental dose-optimization program which includes automated exposure control, adjustment of the mA and/or kV according to patient size and/or use of iterative reconstruction technique. MEDICATIONS: The patient is currently admitted to the hospital and receiving intravenous antibiotics. The antibiotics were administered within an appropriate time frame prior to the initiation of the  procedure. ANESTHESIA/SEDATION: Moderate (conscious) sedation was employed during this procedure. A total of Versed 2 mg and Fentanyl 100 mcg was administered intravenously by the  radiology nurse. Total intra-service moderate Sedation Time: 19 minutes. The patient's level of consciousness and vital signs were monitored continuously by radiology nursing throughout the procedure under my direct supervision. COMPLICATIONS: None immediate. PROCEDURE: Informed written consent was obtained from the patient after a thorough discussion of the procedural risks, benefits and alternatives. All questions were addressed. Maximal Sterile Barrier Technique was utilized including caps, mask, sterile gowns, sterile gloves, sterile drape, hand hygiene and skin antiseptic. A timeout was performed prior to the initiation of the procedure. The patient was placed supine on the exam table. Limited CT of the abdomen and pelvis was performed for planning purposes. This demonstrated significant improvement of gas and fluid collection in the right lower quadrant. The surgically placed drainage catheter courses through this collection. Also noted was unchanged appearance a sizable posterior pelvic collection. Given these findings, the decision was made to proceed with placement of a transgluteal pelvic drainage catheter. The patient was repositioned in the lateral decubitus position. Skin entry site was marked, and the overlying skin was prepped and draped in the standard sterile fashion. Local analgesia was obtained with 1% lidocaine. Using intermittent CT fluoroscopy, a 19 gauge Yueh catheter was advanced towards the identified posterior pelvic fluid collection using a right transgluteal approach. Location was confirmed with CT and return of purulent material. Over an Amplatz wire, the percutaneous tract was serially dilated to accommodate a 12 French locking drainage catheter. Location was again confirmed with CT and return of additional fluid. Locking loop was formed, the drainage catheter was secured to the skin using silk suture and a dressing. It was attached to bulb suction. The patient tolerated the procedure  well without immediate complication. IMPRESSION: 1. CT imaging demonstrated significant improvement in the right lower quadrant collection with surgical drainage catheter in place. Given these findings, the decision was made to proceed with drainage of the unchanged posterior pelvic collection. 2. Successful CT-guided placement of a 12 French locking drainage catheter into the posterior pelvic collection via a right transgluteal approach. Drainage catheter placed to bulb suction. Electronically Signed   By: Olive Bass M.D.   On: 09/30/2022 16:21   CT ABDOMEN PELVIS W CONTRAST  Result Date: 09/29/2022 CLINICAL DATA:  Abdominal pain.  Postop. EXAM: CT ABDOMEN AND PELVIS WITH CONTRAST TECHNIQUE: Multidetector CT imaging of the abdomen and pelvis was performed using the standard protocol following bolus administration of intravenous contrast. RADIATION DOSE REDUCTION: This exam was performed according to the departmental dose-optimization program which includes automated exposure control, adjustment of the mA and/or kV according to patient size and/or use of iterative reconstruction technique. CONTRAST:  80mL OMNIPAQUE IOHEXOL 300 MG/ML  SOLN COMPARISON:  09/18/2022. FINDINGS: Lower chest: Small right and trace left pleural effusions. Associated dependent lower lobe atelectasis, also greater on the right. Small focus of atelectasis at the base of the left upper lobe lingula. Hepatobiliary: Liver normal in size and attenuation. Multiple small low-attenuation liver masses consistent with cysts stable from the prior CT. Normal gallbladder. No bile duct dilation. Pancreas: Unremarkable. Spleen: Normal. Adrenals/Urinary Tract: Bilateral adrenal masses, unchanged from the recent prior CT, right 5.1 cm, 2 on the left 3.6 and 4.2 cm. Normal kidneys, ureters and bladder. Stomach/Bowel: Since the prior CT, abdominal surgery has been performed. There is a midline incision with the skin and subcutaneous fat open, fascia  closed. Colon  has been ligated at the level of the right mid transverse section. The ileum extends into a right anterior mid abdomen ostomy. There are postoperative collections. The largest is a heterogeneous right abdominal collection in the area of the necrotic mass. Soft tissue attenuation along some of the margins of this collection may reflect necrotic tissue or decompressed small bowel or a combination. The collection contains fluid and air. It measures approximately 23 cm from superior to inferior by 16 x 10 cm transversely. A drainage catheter lies within the mid to upper portion of this collection. Small subcapsular collection lies along the right liver margin. A subcapsular component of the larger collection lies along the anterior inferior aspect of the right liver lobe. Fluid is also seen distending the posterior pelvic recess. Remaining colon shows a generalized mild increase in the colonic stool burden, but no wall thickening or convincing inflammation. There is no small bowel dilation to suggest obstruction. Stomach is moderately distended, but without wall thickening. Tiny bubbles of free air lie just deep to the upper abdominal incision line. Vascular/Lymphatic: Well-positioned vena cava filter. Aortic atherosclerosis. Prominent gastrohepatic ligament lymph nodes. Mesenteric nodes noted on the prior CT are less well-defined due to postoperative fluid/edema. Reproductive: Unremarkable. Other: None. Musculoskeletal: No fracture or acute finding. No osteoblastic or osteolytic lesions. IMPRESSION: 1. Status post abdominal surgery since the previous CT scan. Large irregular fluid collection in the right abdomen lies in the location of the previous seen large necrotic mass. Some of this collection may reflect the residual mass. Collection extends from the subcapsular inferior right liver to the right pelvis measuring 23 x 10 x 16 cm. Surgical drain lies within the upper to mid aspect of this collection.  2. 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 No evidence of bowel obstruction. 3. Findings of metastatic disease noted on the recent prior CT are unchanged. Electronically Signed   By: Amie Portland M.D.   On: 09/29/2022 16:02    Labs:  CBC: Recent Labs    09/30/22 1150 09/30/22 1825 10/01/22 0414 10/01/22 0530  WBC 25.9* 33.5* SPECIMEN CONTAMINATED, UNABLE TO PERFORM TEST(S). 31.0*  HGB 8.4* 9.2* SPECIMEN CONTAMINATED, UNABLE TO PERFORM TEST(S). 8.4*  HCT 26.0* 29.7* SPECIMEN CONTAMINATED, UNABLE TO PERFORM TEST(S). 27.6*  PLT 198 208  219 SPECIMEN CONTAMINATED, UNABLE TO PERFORM TEST(S). 212    COAGS: Recent Labs    09/19/22 1713 09/30/22 1825  INR 1.1 1.2  APTT 29 27    BMP: Recent Labs    09/30/22 1148 09/30/22 1825 10/01/22 0414 10/01/22 0530  NA 137 139 SPECIMEN CONTAMINATED, UNABLE TO PERFORM TEST(S). 138  K 3.9 4.4 SPECIMEN CONTAMINATED, UNABLE TO PERFORM TEST(S). 3.6  CL 103 105 SPECIMEN CONTAMINATED, UNABLE TO PERFORM TEST(S). 105  CO2 25 26 SPECIMEN CONTAMINATED, UNABLE TO PERFORM TEST(S). 23  GLUCOSE 117* 161* SPECIMEN CONTAMINATED, UNABLE TO PERFORM TEST(S). 240*  BUN 16 24* SPECIMEN CONTAMINATED, UNABLE TO PERFORM TEST(S). 26*  CALCIUM 7.8* 7.3* SPECIMEN CONTAMINATED, UNABLE TO PERFORM TEST(S). 6.7*  CREATININE 0.66 0.93 SPECIMEN CONTAMINATED, UNABLE TO PERFORM TEST(S). 0.78  GFRNONAA >60 >60 SPECIMEN CONTAMINATED, UNABLE TO PERFORM TEST(S). >60  GFRAA  --   --  SPECIMEN CONTAMINATED, UNABLE TO PERFORM TEST(S).  --     LIVER FUNCTION TESTS: Recent Labs    09/30/22 0357 09/30/22 1825 10/01/22 0414 10/01/22 0530  BILITOT 1.2 1.5* SPECIMEN CONTAMINATED, UNABLE TO PERFORM TEST(S). 0.9  AST 27 36 SPECIMEN CONTAMINATED, UNABLE TO PERFORM TEST(S).  27  ALT 15 15 SPECIMEN CONTAMINATED, UNABLE TO PERFORM TEST(S). 13  ALKPHOS 105 141* SPECIMEN CONTAMINATED, UNABLE TO PERFORM TEST(S). 113  PROT 5.1* 4.6*  SPECIMEN CONTAMINATED, UNABLE TO PERFORM TEST(S). 4.2*  ALBUMIN 2.0* 1.8* SPECIMEN CONTAMINATED, UNABLE TO PERFORM TEST(S). 1.6*    Assessment and Plan: 67 y.o. male with PMH HLD, arthritis, OSA and newly diagnosed DLBCL who was admitted to [facility] from [facility] on 5/23 with persistent abd pain, fatigue, nausea, neck pain, lightheadedness, night sweats, weight loss, poor oral intake. He is s/p expl lap with rt hemicolectomy and end ileostomy on 5/28 for perforation of rt colon into RP due to extensive B cell lymphoma involvement . Also with hx BLE DVT. He is s/p port a cath placement on 5/24 and IVC filter placement on 09/25/22 ; s/p right TG approach drain placement (12 fr to JP) yesterday into post pelvic fluid collection ; CT at that time demonstrated significant improvement in the right lower quadrant collection with surgical drainage catheter in place; afebrile; creat 0.78, WBC 31(33.5), hgb 8.4(9.2); send drain fluid for cx; cont drain irrigation, close OP monitoring, f/u imaging if WBC worsens or output minimal; other plans as per CCS/CCM/TRH/oncology  Electronically Signed: D. Jeananne Rama, PA-C 10/01/2022, 11:50 AM   I spent a total of 15 Minutes at the the patient's bedside AND on the patient's hospital floor or unit, greater than 50% of which was counseling/coordinating care for pelvic fluid collection drain    Patient ID: Justin Cameron, male   DOB: 11-Jul-1955, 67 y.o.   MRN: [id number]

## 2022-10-01 NOTE — Progress Notes (Signed)
Pharmacy Antibiotic Note  Justin Cameron is a 67 y.o. male admitted on 09/19/2022 with perforation of right colon into retroperitoneum due to extensive B-cell lymphoma involving the hepatic flexure right colon and cecum and underwent exploratory laparotomy with right hemicolectomy and end ileostomy.  Has been on Zosyn for IAI. Vancomycin added yesterday for sepsis.   Today, Zosyn discontinued and Pharmacy has been consulted for meropenem dosing for sepsis, worsening despite Zosyn/vancomycin.  Plan: Continue vancomycin 1750 mg IV every 24 hours Begin Meropenem 1 g IV every 8 hours Monitor clinical progress, renal function, vancomycin levels as indicated F/U C&S, abx deescalation / LOT   Height: 5\' 8"  (172.7 cm) Weight:  (UTA at this time with accurate weight due to multiple devices on bed) IBW/kg (Calculated) : 68.4  Temp (24hrs), Avg:99.2 F (37.3 C), Min:98.2 F (36.8 C), Max:101.8 F (38.8 C)  Recent Labs  Lab 09/25/22 0439 09/26/22 0008 09/26/22 0407 09/30/22 0357 09/30/22 1148 09/30/22 1150 09/30/22 1825 09/30/22 2046 10/01/22 0414 10/01/22 0420 10/01/22 0530  WBC 36.9* 10.6*   < > 24.9*  --  25.9* 33.5*  --  SPECIMEN CONTAMINATED, UNABLE TO PERFORM TEST(S).  --  31.0*  CREATININE 1.34*  --    < > 0.67 0.66  --  0.93  --  SPECIMEN CONTAMINATED, UNABLE TO PERFORM TEST(S).  --  0.78  LATICACIDVEN 3.7* 2.1*  --   --   --   --  3.3* 3.5*  --  2.3*  --    < > = values in this interval not displayed.    Estimated Creatinine Clearance: 99.4 mL/min (by C-G formula based on SCr of 0.78 mg/dL).    Not on File  Antimicrobials this admission: 5/23 Augmentin >> 5/23  5/24 Ancef for surgical ppx 5/24 Zosyn >> 6/1, 6/3 >> 6/4 6/3 vancomycin >> 6/4 meropenem >>   Microbiology results: 5/22 BCx: NGTD 5/28 MRSA PCR: not detected 6/4 body fluid from right pelvic: sent  Thank you for allowing pharmacy to be a part of this patient's care.  Lynden Ang, PharmD,  BCPS 10/01/2022 7:10 PM

## 2022-10-01 NOTE — Progress Notes (Addendum)
NAME:  Justin Cameron, MRN:  [id number], DOB:  Oct 12, 1955, LOS: 12 ADMISSION DATE:  09/19/2022, CONSULTATION DATE:  09/24/22 REFERRING MD:  Jarvis Newcomer, CHIEF COMPLAINT:  Abd pain   History of Present Illness:  67 year old man w/ hx of OSA, recently diagnosed large B cell lymphoma causing threatened large bowel obstruction.  Came in after chemo b/c more bloating/pain.  NGT placed, O2 needs went from RA to 15LPM so PCCM consulted.  Denies any cough.  +dyspneic with minimal exertion.  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  Significant Hospital Events: Including procedures, antibiotic start and stop dates in addition to other pertinent events   09/24/22 Exploratory laparotomy with right hemicolectomy and end ileostomy, JP 5/29 Extubated after surgery  6/3 IR CT guided drainage of RLQ abscess, septic shock/rigors after, respiratory distress, intubated and started on pressors   Interim History / Subjective:  No acute events overnight Remains on arctic sun for fever control Levo , Vaso 0.03 1L + out from surgical drains in the last 24 hours.    Objective   Blood pressure (!) 109/55, pulse 89, temperature 99.3 F (37.4 C), resp. rate 20, height 5\' 8"  (1.727 m), weight 90.8 kg, SpO2 99 %. CVP:  [1 mmHg-11 mmHg] 7 mmHg  Vent Mode: PRVC FiO2 (%):  [50 %-100 %] 50 % Set Rate:  [16 bmp-24 bmp] 24 bmp Vt Set:  [540 mL] 540 mL PEEP:  [5 cmH20] 5 cmH20 Plateau Pressure:  [12 cmH20-19 cmH20] 15 cmH20   Intake/Output Summary (Last 24 hours) at 10/01/2022 0759 Last data filed at 10/01/2022 0741 Gross per 24 hour  Intake 7287.69 ml  Output 3099.5 ml  Net 4188.19 ml    Filed Weights   09/28/22 0439 09/29/22 0450 09/30/22 0500  Weight: 91.3 kg 92.3 kg 90.8 kg    Examination:  General: critically ill middle aged male in NAD HENT: [place]/AT, PERRL, no JVD  Cardio: RRR, no MRG Pulm: Clear bilateral vent assisted breaths.  Abd: midline  surgical incision with abdominal dressing. 2 right upper to mid quadrant JP drains (sanguineous dark output), ileostomy  GU: Foley Skin: grossly intact Extremities: generalized edema Neuro: Sedated RASS - 2   Resolved Hospital Problem list   Postoperative vent management-extubated successfully  Assessment & Plan:  Septic Shock secondary to intraabdominal RLQ abscess in setting of diffuse large Bcell lymphoma mass. 5/28 Exploratory laparotomy with right hemicolectomy and end ileostomy along with JP drain placement. 6/3 CT guided JP drain placed by IR on 6/3 in RLQ abscess measuring 23 x 10 x16 cm After placement, patient developed rigors and profound SIRS response  P:  Empiric antibiotics Zosyn/Vanco  MAP > 65 goal, levophed and vasopressin  Will place art line CVP  Active cooling with pads (temps as high as 104.5)  Appreciate CCS  Acute hypoxic and hypercarbic respiratory failure in setting of septic shock  Sleep Apnea hx  P: Full vent support ABG reviewed SBT ongoing now. Will not trial for too long.  VAP bundle Fentanyl and Versed infusions for RASS goal -2 to -3  Anemia in setting of s/p exlap and drain placement  Surgical jp drains output-dark sanguineous fluid, >1LR P: Transfuse if hemoglobin <7  Continue to monitor output from JP drains  DVT Left Lower Extremity s/p IVC  Not candidate at this time for anticoagulation per oncology  P:  IVC placed on 5/29  Continue supportive care  Continue utilization  of SCDS   Atrial Fib RVR Suspecting uncontrolled due to septic shock and hypovolemia > improving P: Continue IV amio Telemetry monitoring   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  Hx of gout  P: Continue po allopurinol  Oncology following and appreciate assistance Monitor daily CMETs for electrolyte imbalances   Severe Protein Malnutrition  P:  Continue TPN, appreciate pharmacy asssitance  Q 4 CBGs   Best Practice (right click and "Reselect all SmartList  Selections" daily)  Diet: TPN DVT prophylaxis: hold for now, SCDs  GI prophylaxis: PPI Code Status: full  Lines: Port, PICC, ETT, Foley, Drain x 2, to place Secretary/administrator.  Family Communication: updated at bedside Disposition: ICU  Best practice:   Critical care time 39 minutes   [name], AGACNP-BC [facility]  See Amion for personal pager PCCM on call pager [phone] until 7pm. Please call Elink 7p-7a. [phone]  10/01/2022 8:17 AM

## 2022-10-01 NOTE — Progress Notes (Signed)
7 Days Post-Op   Subjective/Chief Complaint: Events of yesterday noted.  On vent secondary to SIRS response post IR drain placement.  About 1L of output from surgical drain yesterday.  On artic sun this am, had Tmax yesterday of 104.5  Objective: Vital signs in last 24 hours: Temp:  [98.2 F (36.8 C)-104.5 F (40.3 C)] 99.3 F (37.4 C) (06/04 0735) Pulse Rate:  [82-152] 89 (06/04 0735) Resp:  [0-89] 20 (06/04 0735) BP: (83-245)/(40-231) 109/55 (06/04 0700) SpO2:  [80 %-100 %] 99 % (06/04 0735) FiO2 (%):  [50 %-100 %] 50 % (06/04 0735) Last BM Date : 10/01/22  Intake/Output from previous day: 06/03 0701 - 06/04 0700 In: 7015.2 [I.V.:5364.6; IV Piggyback:1650.6] Out: 3159.5 [Urine:1450; Emesis/NG output:510; Drains:1047.5; Stool:152] Intake/Output this shift: Total I/O In: 272.5 [I.V.:272.5] Out: 65 [Urine:50; Emesis/NG output:15]  PE: Abd: incision open with minimal granulation tissue, some necrotic tissue noted along inferior and superior portion of incision.  Loosening of the fascial sutures but not significant dehiscence at this time. Ostomy RLQ functional with mostly air.  Minimal bilious output Drain dark serosang today.  IR drain more lighter serosang    Lab Results:  Recent Labs    10/01/22 0414 10/01/22 0530  WBC SPECIMEN CONTAMINATED, UNABLE TO PERFORM TEST(S). 31.0*  HGB SPECIMEN CONTAMINATED, UNABLE TO PERFORM TEST(S). 8.4*  HCT SPECIMEN CONTAMINATED, UNABLE TO PERFORM TEST(S). 27.6*  PLT SPECIMEN CONTAMINATED, UNABLE TO PERFORM TEST(S). 212   BMET Recent Labs    10/01/22 0414 10/01/22 0530  NA SPECIMEN CONTAMINATED, UNABLE TO PERFORM TEST(S). 138  K SPECIMEN CONTAMINATED, UNABLE TO PERFORM TEST(S). 3.6  CL SPECIMEN CONTAMINATED, UNABLE TO PERFORM TEST(S). 105  CO2 SPECIMEN CONTAMINATED, UNABLE TO PERFORM TEST(S). 23  GLUCOSE SPECIMEN CONTAMINATED, UNABLE TO PERFORM TEST(S). 240*  BUN SPECIMEN CONTAMINATED, UNABLE TO PERFORM TEST(S). 26*  CREATININE  SPECIMEN CONTAMINATED, UNABLE TO PERFORM TEST(S). 0.78  CALCIUM SPECIMEN CONTAMINATED, UNABLE TO PERFORM TEST(S). 6.7*   PT/INR Recent Labs    09/30/22 1825  LABPROT 15.6*  INR 1.2   ABG Recent Labs    09/30/22 1755 09/30/22 2145  PHART 7.13* 7.32*  HCO3 25.5 24.7    Studies/Results: DG CHEST PORT 1 VIEW  Result Date: 09/30/2022 CLINICAL DATA:  OG tube and endotracheal tube placement. EXAM: PORTABLE CHEST 1 VIEW COMPARISON:  09/24/2022 FINDINGS: An endotracheal tube with tip 2.5 cm above the carina, RIGHT PICC line with tip difficult to visualize but appears to overlie the RIGHT atrium and a RIGHT Port-A-Cath with tip overlying the RIGHT atrium as well. An enteric tube is noted with tip overlying the mid stomach. Bibasilar opacities/atelectasis noted. There is no evidence of pneumothorax or large pleural effusion. IMPRESSION: 1. RIGHT PICC line placement but otherwise unchanged appearance of the chest. Other support apparatus as described with mild bibasilar opacities/atelectasis. Electronically Signed   By: Harmon Pier M.D.   On: 09/30/2022 17:26   CT GUIDED PERITONEAL/RETROPERITONEAL FLUID DRAIN BY PERC CATH  Result Date: 09/30/2022 INDICATION: [id number] Postprocedural intraabdominal abscess [id number] EXAM: CT-guided pelvic drain placement TECHNIQUE: Multidetector CT imaging of the abdomen and pelvis was performed following the standard protocol without IV contrast. RADIATION DOSE REDUCTION: This exam was performed according to the departmental dose-optimization program which includes automated exposure control, adjustment of the mA and/or kV according to patient size and/or use of iterative reconstruction technique. MEDICATIONS: The patient is currently admitted to the hospital and receiving intravenous antibiotics. The antibiotics were administered within an appropriate time frame prior to the initiation of the  procedure. ANESTHESIA/SEDATION: Moderate (conscious) sedation was employed during  this procedure. A total of Versed 2 mg and Fentanyl 100 mcg was administered intravenously by the radiology nurse. Total intra-service moderate Sedation Time: 19 minutes. The patient's level of consciousness and vital signs were monitored continuously by radiology nursing throughout the procedure under my direct supervision. COMPLICATIONS: None immediate. PROCEDURE: Informed written consent was obtained from the patient after a thorough discussion of the procedural risks, benefits and alternatives. All questions were addressed. Maximal Sterile Barrier Technique was utilized including caps, mask, sterile gowns, sterile gloves, sterile drape, hand hygiene and skin antiseptic. A timeout was performed prior to the initiation of the procedure. The patient was placed supine on the exam table. Limited CT of the abdomen and pelvis was performed for planning purposes. This demonstrated significant improvement of gas and fluid collection in the right lower quadrant. The surgically placed drainage catheter courses through this collection. Also noted was unchanged appearance a sizable posterior pelvic collection. Given these findings, the decision was made to proceed with placement of a transgluteal pelvic drainage catheter. The patient was repositioned in the lateral decubitus position. Skin entry site was marked, and the overlying skin was prepped and draped in the standard sterile fashion. Local analgesia was obtained with 1% lidocaine. Using intermittent CT fluoroscopy, a 19 gauge Yueh catheter was advanced towards the identified posterior pelvic fluid collection using a right transgluteal approach. Location was confirmed with CT and return of purulent material. Over an Amplatz wire, the percutaneous tract was serially dilated to accommodate a 12 French locking drainage catheter. Location was again confirmed with CT and return of additional fluid. Locking loop was formed, the drainage catheter was secured to the skin using  silk suture and a dressing. It was attached to bulb suction. The patient tolerated the procedure well without immediate complication. IMPRESSION: 1. CT imaging demonstrated significant improvement in the right lower quadrant collection with surgical drainage catheter in place. Given these findings, the decision was made to proceed with drainage of the unchanged posterior pelvic collection. 2. Successful CT-guided placement of a 12 French locking drainage catheter into the posterior pelvic collection via a right transgluteal approach. Drainage catheter placed to bulb suction. Electronically Signed   By: Olive Bass M.D.   On: 09/30/2022 16:21   CT ABDOMEN PELVIS W CONTRAST  Result Date: 09/29/2022 CLINICAL DATA:  Abdominal pain.  Postop. EXAM: CT ABDOMEN AND PELVIS WITH CONTRAST TECHNIQUE: Multidetector CT imaging of the abdomen and pelvis was performed using the standard protocol following bolus administration of intravenous contrast. RADIATION DOSE REDUCTION: This exam was performed according to the departmental dose-optimization program which includes automated exposure control, adjustment of the mA and/or kV according to patient size and/or use of iterative reconstruction technique. CONTRAST:  80mL OMNIPAQUE IOHEXOL 300 MG/ML  SOLN COMPARISON:  09/18/2022. FINDINGS: Lower chest: Small right and trace left pleural effusions. Associated dependent lower lobe atelectasis, also greater on the right. Small focus of atelectasis at the base of the left upper lobe lingula. Hepatobiliary: Liver normal in size and attenuation. Multiple small low-attenuation liver masses consistent with cysts stable from the prior CT. Normal gallbladder. No bile duct dilation. Pancreas: Unremarkable. Spleen: Normal. Adrenals/Urinary Tract: Bilateral adrenal masses, unchanged from the recent prior CT, right 5.1 cm, 2 on the left 3.6 and 4.2 cm. Normal kidneys, ureters and bladder. Stomach/Bowel: Since the prior CT, abdominal surgery has  been performed. There is a midline incision with the skin and subcutaneous fat open, fascia closed. Colon  has been ligated at the level of the right mid transverse section. The ileum extends into a right anterior mid abdomen ostomy. There are postoperative collections. The largest is a heterogeneous right abdominal collection in the area of the necrotic mass. Soft tissue attenuation along some of the margins of this collection may reflect necrotic tissue or decompressed small bowel or a combination. The collection contains fluid and air. It measures approximately 23 cm from superior to inferior by 16 x 10 cm transversely. A drainage catheter lies within the mid to upper portion of this collection. Small subcapsular collection lies along the right liver margin. A subcapsular component of the larger collection lies along the anterior inferior aspect of the right liver lobe. Fluid is also seen distending the posterior pelvic recess. Remaining colon shows a generalized mild increase in the colonic stool burden, but no wall thickening or convincing inflammation. There is no small bowel dilation to suggest obstruction. Stomach is moderately distended, but without wall thickening. Tiny bubbles of free air lie just deep to the upper abdominal incision line. Vascular/Lymphatic: Well-positioned vena cava filter. Aortic atherosclerosis. Prominent gastrohepatic ligament lymph nodes. Mesenteric nodes noted on the prior CT are less well-defined due to postoperative fluid/edema. Reproductive: Unremarkable. Other: None. Musculoskeletal: No fracture or acute finding. No osteoblastic or osteolytic lesions. IMPRESSION: 1. Status post abdominal surgery since the previous CT scan. Large irregular fluid collection in the right abdomen lies in the location of the previous seen large necrotic mass. Some of this collection may reflect the residual mass. Collection extends from the subcapsular inferior right liver to the right pelvis  measuring 23 x 10 x 16 cm. Surgical drain lies within the upper to mid aspect of this collection. 2. 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 No evidence of bowel obstruction. 3. Findings of metastatic disease noted on the recent prior CT are unchanged. Electronically Signed   By: Amie Portland M.D.   On: 09/29/2022 16:02    Anti-infectives: Anti-infectives (From admission, onward)    Start     Dose/Rate Route Frequency Ordered Stop   10/01/22 2000  vancomycin (VANCOREADY) IVPB 1750 mg/350 mL        1,750 mg 175 mL/hr over 120 Minutes Intravenous Every 24 hours 09/30/22 1748     09/30/22 1845  vancomycin (VANCOREADY) IVPB 2000 mg/400 mL        2,000 mg 200 mL/hr over 120 Minutes Intravenous  Once 09/30/22 1748 09/30/22 2317   09/30/22 0800  piperacillin-tazobactam (ZOSYN) IVPB 3.375 g        3.375 g 12.5 mL/hr over 240 Minutes Intravenous Every 8 hours 09/30/22 0701     09/20/22 1402  ceFAZolin (ANCEF) IVPB 2g/100 mL premix        over 30 Minutes Intravenous Continuous PRN 09/20/22 1411 09/20/22 1402   09/20/22 0600  piperacillin-tazobactam (ZOSYN) IVPB 3.375 g  Status:  Discontinued        3.375 g 12.5 mL/hr over 240 Minutes Intravenous Every 8 hours 09/19/22 1943 09/28/22 1134   09/19/22 1800  amoxicillin-clavulanate (AUGMENTIN) 875-125 MG per tablet 1 tablet  Status:  Discontinued        1 tablet Oral Every 12 hours 09/19/22 1659 09/19/22 1939       Assessment/Plan: POD 7, 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  Now on vent, per CCM. -apparently  no cultures were sent from IR sample yesterday -continue abx -continue wound care- hopefully this remains intact, but discussed with wife this morning that tissue is fragile at the base and this could start to separate more over time. -WBC stable around 31K  today, monitor  FEN - OGT to LIWS/IVFs/TNA VTE - ok for chemical prophylaxis from surgical standpoint, but has IVC filter in place ID - vanc/zosyn  Acute hypoxic respiratory failure - per CCM B-cell lymphoma SIRS - s/p IR drain placement LLE DVT SPCM - TNA ABL anemia  Letha Cape, PA-C  10/01/2022

## 2022-10-01 NOTE — Procedures (Signed)
Arterial Catheter Insertion Procedure Note  Justin Cameron  [id number]  11-Feb-1956  Date:10/01/22  Time:9:23 AM    Provider Performing: Duayne Cal    Procedure: Insertion of Arterial Line (40981) with US guidance (19147)   Indication(s) Blood pressure monitoring and/or need for frequent ABGs  Consent Risks of the procedure as well as the alternatives and risks of each were explained to the patient and/or caregiver.  Consent for the procedure was obtained and is signed in the bedside chart  Anesthesia None   Time Out Verified patient identification, verified procedure, site/side was marked, verified correct patient position, special equipment/implants available, medications/allergies/relevant history reviewed, required imaging and test results available.   Sterile Technique Maximal sterile technique including full sterile barrier drape, hand hygiene, sterile gown, sterile gloves, mask, hair covering, sterile ultrasound probe cover (if used).   Procedure Description Area of catheter insertion was cleaned with chlorhexidine and draped in sterile fashion. With real-time ultrasound guidance an arterial catheter was placed into the right radial artery.  Appropriate arterial tracings confirmed on monitor.     Complications/Tolerance None; patient tolerated the procedure well.   EBL Minimal   Specimen(s) None   [name], AGACNP-BC [facility]  See Amion for personal pager PCCM on call pager [phone] until 7pm. Please call Elink 7p-7a. [phone]  10/01/2022 9:23 AM

## 2022-10-01 NOTE — Progress Notes (Signed)
Chaplain followed up with Justin Cameron and his wife. Chaplain and Mrs. Schutt engaged in discussion about Ayven's healthcare journey and their faith. Chaplain is familiar with the Kurt family from a previous encounter.  Chaplain and Mrs. Tartaglia were also able to discuss grief and how it can show up, as well as what she has found comfort in throughout this process.  Chaplain provided compassionate and supportive presence with reflective listening. Chaplain also talked to [place] while on the ventilator letting him know of support and care around him.   Chaplains are available to follow-up.   10/01/22 1500  Spiritual Encounters  Type of Visit Follow up  Care provided to: Pt and family  Reason for visit Routine spiritual support

## 2022-10-02 ENCOUNTER — Inpatient Hospital Stay (HOSPITAL_COMMUNITY): Payer: Medicare Other

## 2022-10-02 DIAGNOSIS — C8330 Diffuse large B-cell lymphoma, unspecified site: Secondary | ICD-10-CM | POA: Diagnosis not present

## 2022-10-02 LAB — CBC WITH DIFFERENTIAL/PLATELET
Abs Immature Granulocytes: 0.69 10*3/uL — ABNORMAL HIGH (ref 0.00–0.07)
Basophils Absolute: 0.1 10*3/uL (ref 0.0–0.1)
Basophils Relative: 0 %
Eosinophils Absolute: 0.7 10*3/uL — ABNORMAL HIGH (ref 0.0–0.5)
Eosinophils Relative: 3 %
HCT: 23.5 % — ABNORMAL LOW (ref 39.0–52.0)
Hemoglobin: 7.2 g/dL — ABNORMAL LOW (ref 13.0–17.0)
Immature Granulocytes: 4 %
Lymphocytes Relative: 3 %
Lymphs Abs: 0.6 10*3/uL — ABNORMAL LOW (ref 0.7–4.0)
MCH: 25.4 pg — ABNORMAL LOW (ref 26.0–34.0)
MCHC: 30.6 g/dL (ref 30.0–36.0)
MCV: 82.7 fL (ref 80.0–100.0)
Monocytes Absolute: 1.2 10*3/uL — ABNORMAL HIGH (ref 0.1–1.0)
Monocytes Relative: 6 %
Neutro Abs: 16.5 10*3/uL — ABNORMAL HIGH (ref 1.7–7.7)
Neutrophils Relative %: 84 %
Platelets: 196 10*3/uL (ref 150–400)
RBC: 2.84 MIL/uL — ABNORMAL LOW (ref 4.22–5.81)
RDW: 18.6 % — ABNORMAL HIGH (ref 11.5–15.5)
WBC: 19.7 10*3/uL — ABNORMAL HIGH (ref 4.0–10.5)
nRBC: 0.1 % (ref 0.0–0.2)

## 2022-10-02 LAB — COMPREHENSIVE METABOLIC PANEL
ALT: 11 U/L (ref 0–44)
AST: 21 U/L (ref 15–41)
Albumin: 2.1 g/dL — ABNORMAL LOW (ref 3.5–5.0)
Alkaline Phosphatase: 90 U/L (ref 38–126)
Anion gap: 9 (ref 5–15)
BUN: 23 mg/dL (ref 8–23)
CO2: 25 mmol/L (ref 22–32)
Calcium: 7.3 mg/dL — ABNORMAL LOW (ref 8.9–10.3)
Chloride: 102 mmol/L (ref 98–111)
Creatinine, Ser: 0.65 mg/dL (ref 0.61–1.24)
GFR, Estimated: >60 mL/min (ref >60)
Glucose, Bld: 140 mg/dL — ABNORMAL HIGH (ref 70–99)
Potassium: 3.7 mmol/L (ref 3.5–5.1)
Sodium: 136 mmol/L (ref 135–145)
Total Bilirubin: 1.2 mg/dL (ref 0.3–1.2)
Total Protein: 4.9 g/dL — ABNORMAL LOW (ref 6.5–8.1)

## 2022-10-02 LAB — HEMOGLOBIN AND HEMATOCRIT, BLOOD
HCT: 24.7 % — ABNORMAL LOW (ref 39.0–52.0)
Hemoglobin: 7.7 g/dL — ABNORMAL LOW (ref 13.0–17.0)

## 2022-10-02 LAB — BODY FLUID CULTURE W GRAM STAIN

## 2022-10-02 LAB — GLUCOSE, CAPILLARY
Glucose-Capillary: 133 mg/dL — ABNORMAL HIGH (ref 70–99)
Glucose-Capillary: 134 mg/dL — ABNORMAL HIGH (ref 70–99)
Glucose-Capillary: 137 mg/dL — ABNORMAL HIGH (ref 70–99)
Glucose-Capillary: 140 mg/dL — ABNORMAL HIGH (ref 70–99)
Glucose-Capillary: 173 mg/dL — ABNORMAL HIGH (ref 70–99)
Glucose-Capillary: 176 mg/dL — ABNORMAL HIGH (ref 70–99)
Glucose-Capillary: 184 mg/dL — ABNORMAL HIGH (ref 70–99)

## 2022-10-02 MED ORDER — TRACE MINERALS CU-MN-SE-ZN 300-55-60-3000 MCG/ML IV SOLN
INTRAVENOUS | Status: AC
  Filled 2022-10-02: qty 876

## 2022-10-02 MED ORDER — POTASSIUM CHLORIDE 10 MEQ/50ML IV SOLN
10.0000 meq | INTRAVENOUS | Status: AC
  Administered 2022-10-02 (×3): 10 meq via INTRAVENOUS
  Filled 2022-10-02 (×3): qty 50

## 2022-10-02 MED ORDER — HYDROCORTISONE SOD SUC (PF) 100 MG IJ SOLR
100.0000 mg | Freq: Two times a day (BID) | INTRAMUSCULAR | Status: DC
  Administered 2022-10-02 – 2022-10-04 (×4): 100 mg via INTRAVENOUS
  Filled 2022-10-02 (×4): qty 2

## 2022-10-02 NOTE — Progress Notes (Signed)
   10/02/22 1630  Spiritual Encounters  Type of Visit Initial  Care provided to: Family  Referral source Chaplain team  Reason for visit Routine spiritual support  OnCall Visit No  Spiritual Framework  Presenting Themes Values and beliefs;Impactful experiences and emotions  Values/beliefs faith in god  Community/Connection Family  Patient Stress Factors Health changes  Family Stress Factors Major life changes  Interventions  Spiritual Care Interventions Made Established relationship of care and support;Compassionate presence;Reflective listening;Normalization of emotions;Explored values/beliefs/practices/strengths;Encouragement  Intervention Outcomes  Outcomes Connection to spiritual care;Awareness around self/spiritual resourses;Awareness of support;Reduced isolation;Patient family open to resources  Spiritual Care Plan  Spiritual Care Issues Still Outstanding Lunette Stands will continue to follow  Follow up plan  Chaplain will attempt to visit with the patient's wife i the am 10/03/2022   Chaplain stopped by the room to offer support for the patient and family. Patient's wife [name] had left for a little while. Her sister Bonita Quin and their brother Dannielle Huh were present at bedside. Chaplain provided spiritual and emotional support to the family. Bonita Quin stated she would let her sister the patient's wife know that a chaplain stopped by and that the chaplain would stop by to see her in the am on 10/03/2022.   Arlyce Dice, Chaplain Resident

## 2022-10-02 NOTE — Progress Notes (Signed)
PHARMACY - TOTAL PARENTERAL NUTRITION CONSULT NOTE   Indication: Prolonged ileus  Patient Measurements: Height: 5\' 8"  (172.7 cm) Weight: 98.4 kg (216 lb 14.9 oz) (not accurate due to TTM pads) IBW/kg (Calculated) : 68.4 TPN AdjBW (KG): 81 Body mass index is 32.98 kg/m.  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  He was supposed to start chemo, but developed severe acute abdominal pain on 5/28.  NG tube placed, imaging to confirm placement shows possible free air and he was taken to OR on 5/28. Pharmacy consulted to dose TPN for prolonged ileus.    CT with increased fluid collection in right abdomen around surgical drain. IR placed drain 6/3. Patient later intubated and started on vasopressors. Patient with extravascular volume, given albumin 6/4. TPN concentrated 6/4 in attempt to reduce fluid burden. Patient remains intubated but with reduced vasopressor requirements on 6/5. Fever curve and WBC starting to improve.  Glucose / Insulin: No hx DM, no PTA medications.   - CBGs improved, mostly within goal < 150 - 8u SSI Electrolytes: improved, corrected Ca 8.8 Renal: stable Hepatic: AST/ALT, Tbili WNL. TG 201 Intake / Output: -Drains 1390 mL -Urine 1875 mL GI Imaging:  -6/2 CT abd/pelvis - large irregular fluid collection in the right abdomen lies in the location of the previous seen large necrotic mass -5/28  Lucency in the right upper quadrant was not seen on the previous images. Free intraperitoneal air is not excluded.  GI Surgeries / Procedures:  -5/28 Exploratory laparotomy with right hemicolectomy and end ileostomy   Central access: Port (09/20/22), Triple lumen PICC placed 5/29 TPN start date: 5/29  Nutritional Goals: Goal TPN rate of 75 mL/hr provides 131 g of protein and 2228 kcals per day  (Note this is reduced in attempt to reduce  fluid burden)  RD Assessment: updated 6/4 following intubation Estimated Needs Total Energy Estimated Needs: 2250-2450 kcals Total Protein Estimated Needs: 120-140 grams Total Fluid Estimated Needs: >/= 2.2L  Current Nutrition: NPO 6/3  Plan:  -Will maintain concentrated TPN for now  -Change TPN to 75 mL/hr -Electrolytes in TPN: Na 90 mEq/L, K 60 mEq/L, Ca 3 mEq/L, Mg 5 mEq/L, and Phos 15 mmol/L. Cl:Ac 1:1 -Add standard MVI and trace elements to TPN -Continue sensitive SSI q4h and adjust as needed  -Monitor TPN labs on Mon/Thurs   Pricilla Riffle, PharmD, BCPS Clinical Pharmacist 10/02/2022 11:36 AM

## 2022-10-02 NOTE — Plan of Care (Signed)
  Problem: Education: Goal: Knowledge of General Education information will improve Description: Including pain rating scale, medication(s)/side effects and non-pharmacologic comfort measures Outcome: Progressing   Problem: Health Behavior/Discharge Planning: Goal: Ability to manage health-related needs will improve Outcome: Progressing   Problem: Clinical Measurements: Goal: Ability to maintain clinical measurements within normal limits will improve Outcome: Progressing Goal: Will remain free from infection Outcome: Progressing Goal: Diagnostic test results will improve Outcome: Progressing Goal: Respiratory complications will improve Outcome: Progressing Goal: Cardiovascular complication will be avoided Outcome: Progressing   Problem: Activity: Goal: Risk for activity intolerance will decrease Outcome: Progressing   Problem: Nutrition: Goal: Adequate nutrition will be maintained Outcome: Progressing   Problem: Coping: Goal: Level of anxiety will decrease Outcome: Progressing   Problem: Elimination: Goal: Will not experience complications related to bowel motility Outcome: Progressing Goal: Will not experience complications related to urinary retention Outcome: Progressing   Problem: Pain Managment: Goal: General experience of comfort will improve Outcome: Progressing   Problem: Safety: Goal: Ability to remain free from injury will improve Outcome: Progressing   Problem: Skin Integrity: Goal: Risk for impaired skin integrity will decrease Outcome: Progressing   Problem: Education: Goal: Knowledge of the prescribed therapeutic regimen will improve Outcome: Progressing   Problem: Activity: Goal: Ability to implement measures to reduce episodes of fatigue will improve Outcome: Progressing   Problem: Bowel/Gastric: Goal: Will not experience complications related to bowel motility Outcome: Progressing   Problem: Coping: Goal: Ability to identify and develop  effective coping behavior will improve Outcome: Progressing   Problem: Nutritional: Goal: Maintenance of adequate nutrition will improve Outcome: Progressing   Problem: Activity: Goal: Ability to tolerate increased activity will improve Outcome: Progressing   Problem: Respiratory: Goal: Ability to maintain a clear airway and adequate ventilation will improve Outcome: Progressing   Problem: Role Relationship: Goal: Method of communication will improve Outcome: Progressing   Problem: Education: Goal: Ability to describe self-care measures that may prevent or decrease complications (Diabetes Survival Skills Education) will improve Outcome: Progressing Goal: Individualized Educational Video(s) Outcome: Progressing   Problem: Coping: Goal: Ability to adjust to condition or change in health will improve Outcome: Progressing   Problem: Fluid Volume: Goal: Ability to maintain a balanced intake and output will improve Outcome: Progressing   Problem: Health Behavior/Discharge Planning: Goal: Ability to identify and utilize available resources and services will improve Outcome: Progressing Goal: Ability to manage health-related needs will improve Outcome: Progressing   Problem: Metabolic: Goal: Ability to maintain appropriate glucose levels will improve Outcome: Progressing   Problem: Nutritional: Goal: Maintenance of adequate nutrition will improve Outcome: Progressing Goal: Progress toward achieving an optimal weight will improve Outcome: Progressing   Problem: Skin Integrity: Goal: Risk for impaired skin integrity will decrease Outcome: Progressing   Problem: Tissue Perfusion: Goal: Adequacy of tissue perfusion will improve Outcome: Progressing   Problem: Education: Goal: Ability to manage disease process will improve Outcome: Progressing   Problem: Cardiac: Goal: Ability to achieve and maintain adequate cardiopulmonary perfusion will improve Outcome:  Progressing   Problem: Neurologic: Goal: Promote progressive neurologic recovery Outcome: Progressing   Problem: Skin Integrity: Goal: Risk for impaired skin integrity will be minimized. Outcome: Progressing

## 2022-10-02 NOTE — Progress Notes (Signed)
NAME:  Justin Cameron, MRN:  [id number], DOB:  06-10-1955, LOS: 13 ADMISSION DATE:  09/19/2022, CONSULTATION DATE:  09/24/22 REFERRING MD:  Jarvis Newcomer, CHIEF COMPLAINT:  Abd pain   History of Present Illness:  67 year old man w/ hx of OSA, recently diagnosed large B cell lymphoma causing threatened large bowel obstruction.  Came in after chemo b/c more bloating/pain.  NGT placed, O2 needs went from RA to 15LPM so PCCM consulted.  Denies any cough.  +dyspneic with minimal exertion.  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  Significant Hospital Events: Including procedures, antibiotic start and stop dates in addition to other pertinent events   09/24/22 Exploratory laparotomy with right hemicolectomy and end ileostomy, JP 5/29 Extubated after surgery  6/3 IR CT guided drainage of RLQ abscess, septic shock/rigors after, respiratory distress, intubated and started on pressors   Interim History / Subjective:  No acute events overnight Pressors weaning WBC 31 > 19 Fevers improved Drain output has improved significantly Making good urine  Objective   Blood pressure (!) 120/52, pulse 76, temperature 98.6 F (37 C), resp. rate (!) 21, height 5\' 8"  (1.727 m), weight 98.4 kg, SpO2 97 %. CVP:  [2 mmHg-15 mmHg] 13 mmHg  Vent Mode: PRVC FiO2 (%):  [30 %-40 %] 30 % Set Rate:  [24 bmp] 24 bmp Vt Set:  [540 mL] 540 mL PEEP:  [5 cmH20] 5 cmH20 Pressure Support:  [10 cmH20] 10 cmH20 Plateau Pressure:  [18 cmH20-20 cmH20] 18 cmH20   Intake/Output Summary (Last 24 hours) at 10/02/2022 0758 Last data filed at 10/02/2022 0756 Gross per 24 hour  Intake 5472.92 ml  Output 2577 ml  Net 2895.92 ml    Filed Weights   10/02/22 0500  Weight: 98.4 kg    Examination:  General: Critically ill middle aged male in NAD HENT: [place]/AT, PERRL, no JVD Cardio: RRR, no MRG Pulm: Clear bilateral breath sounds.  Abd: Midline surgical incision with abdominal  dressing. Ileostomy draining a small amount of dark liquid. Anterior surgical drain with a small amount of dark SS fluid. Posterior IR drain with a small amount of lighter SS fluid.  GU: Foley draining clear urine.  Skin: Surgical wound as above.  Extremities: generalized anasarca Neuro: RASS -1   Resolved Hospital Problem list   Postoperative vent management-extubated successfully  Assessment & Plan:  Septic Shock secondary to intraabdominal RLQ abscess in setting of diffuse large Bcell lymphoma mass. 5/28 Exploratory laparotomy with right hemicolectomy and end ileostomy along with JP drain placement. 6/3 CT guided JP drain placed by IR on 6/3 in RLQ abscess measuring 23 x 10 x16 cm After placement, patient developed rigors and profound SIRS response  P:  Empiric antibiotics. Zosyn broadened to [facility] 6/4. Continue Vancomycin MAP > 65 goal, levophed and vasopressin. NE dose improved from 33 to 22 today.   DC cooling pads Monitor drain output Appreciate CCS  Acute hypoxic and hypercarbic respiratory failure in setting of septic shock  Sleep Apnea hx  P: Full vent support VAP bundle Reasonable to SAT/SBT today Fentanyl and Versed infusions for RASS goal -1 to -2  Anemia in setting of s/p exlap and drain placement  P: Transfuse if hemoglobin <7  Continue to monitor output from JP drains  DVT Left Lower Extremity s/p IVC  Not candidate at this time for anticoagulation per oncology  P:  IVC placed 5/29  Continue supportive care  Continue SCDS  Atrial Fib RVR Suspecting uncontrolled due to septic shock and hypovolemia > improving P: Continue IV amio Telemetry monitoring   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  Hx of gout  P: Allopurinol on hold while NPO Oncology following loosely  Monitor daily CMETs for electrolyte imbalances   Severe Protein Malnutrition  P:  Continue TPN, appreciate pharmacy asssitance  Defer trickle TF initiation to surgery Q 4 CBGs   Best Practice  (right click and "Reselect all SmartList Selections" daily)  Diet: TPN DVT prophylaxis: hold for now, SCDs  GI prophylaxis: PPI Code Status: full  Lines: Port, PICC, ETT, Foley, Drain x 2, arterial line.   Family Communication: Wife updated at bedside this morning.  Disposition: ICU  Best practice:   Critical care time 37 minutes   [name], AGACNP-BC [facility]  See Amion for personal pager PCCM on call pager [phone] until 7pm. Please call Elink 7p-7a. [phone]  10/02/2022 7:58 AM

## 2022-10-02 NOTE — Progress Notes (Signed)
8 Days Post-Op   Subjective/Chief Complaint: Somewhat better this am   Objective: Vital signs in last 24 hours: Temp:  [98.2 F (36.8 C)-101.3 F (38.5 C)] 98.6 F (37 C) (06/05 0802) Pulse Rate:  [39-95] 75 (06/05 0802) Resp:  [12-31] 24 (06/05 0802) BP: (111-174)/(49-72) 131/50 (06/05 0802) SpO2:  [93 %-100 %] 98 % (06/05 0802) Arterial Line BP: (112-204)/(36-66) 138/45 (06/05 0802) FiO2 (%):  [30 %-40 %] 30 % (06/05 0802) Weight:  [98.4 kg] 98.4 kg (06/05 0500) Last BM Date : 10/01/22  Intake/Output from previous day: 06/04 0701 - 06/05 0700 In: 5586.5 [I.V.:4498.3; NG/GT:180; IV Piggyback:898.2] Out: 2642 [Urine:1625; Emesis/NG output:840; Drains:102; Stool:75] Intake/Output this shift: Total I/O In: 203 [I.V.:203] Out: -   Ab approp tender, drains as expected, wound with necrotic fascial edges and dehiscence with visible suture, no evisceration, ostomy functional    Lab Results:  Recent Labs    10/01/22 0530 10/02/22 0552  WBC 31.0* 19.7*  HGB 8.4* 7.2*  HCT 27.6* 23.5*  PLT 212 196   BMET Recent Labs    10/01/22 0414 10/01/22 0530  NA SPECIMEN CONTAMINATED, UNABLE TO PERFORM TEST(S). 138  K SPECIMEN CONTAMINATED, UNABLE TO PERFORM TEST(S). 3.6  CL SPECIMEN CONTAMINATED, UNABLE TO PERFORM TEST(S). 105  CO2 SPECIMEN CONTAMINATED, UNABLE TO PERFORM TEST(S). 23  GLUCOSE SPECIMEN CONTAMINATED, UNABLE TO PERFORM TEST(S). 240*  BUN SPECIMEN CONTAMINATED, UNABLE TO PERFORM TEST(S). 26*  CREATININE SPECIMEN CONTAMINATED, UNABLE TO PERFORM TEST(S). 0.78  CALCIUM SPECIMEN CONTAMINATED, UNABLE TO PERFORM TEST(S). 6.7*   PT/INR Recent Labs    09/30/22 1825  LABPROT 15.6*  INR 1.2   ABG Recent Labs    09/30/22 2145 10/01/22 1118  PHART 7.32* 7.35  HCO3 24.7 27.1    Studies/Results: DG CHEST PORT 1 VIEW  Result Date: 09/30/2022 CLINICAL DATA:  OG tube and endotracheal tube placement. EXAM: PORTABLE CHEST 1 VIEW COMPARISON:  09/24/2022 FINDINGS: An  endotracheal tube with tip 2.5 cm above the carina, RIGHT PICC line with tip difficult to visualize but appears to overlie the RIGHT atrium and a RIGHT Port-A-Cath with tip overlying the RIGHT atrium as well. An enteric tube is noted with tip overlying the mid stomach. Bibasilar opacities/atelectasis noted. There is no evidence of pneumothorax or large pleural effusion. IMPRESSION: 1. RIGHT PICC line placement but otherwise unchanged appearance of the chest. Other support apparatus as described with mild bibasilar opacities/atelectasis. Electronically Signed   By: Harmon Pier M.D.   On: 09/30/2022 17:26   CT GUIDED PERITONEAL/RETROPERITONEAL FLUID DRAIN BY PERC CATH  Result Date: 09/30/2022 INDICATION: [id number] Postprocedural intraabdominal abscess [id number] EXAM: CT-guided pelvic drain placement TECHNIQUE: Multidetector CT imaging of the abdomen and pelvis was performed following the standard protocol without IV contrast. RADIATION DOSE REDUCTION: This exam was performed according to the departmental dose-optimization program which includes automated exposure control, adjustment of the mA and/or kV according to patient size and/or use of iterative reconstruction technique. MEDICATIONS: The patient is currently admitted to the hospital and receiving intravenous antibiotics. The antibiotics were administered within an appropriate time frame prior to the initiation of the procedure. ANESTHESIA/SEDATION: Moderate (conscious) sedation was employed during this procedure. A total of Versed 2 mg and Fentanyl 100 mcg was administered intravenously by the radiology nurse. Total intra-service moderate Sedation Time: 19 minutes. The patient's level of consciousness and vital signs were monitored continuously by radiology nursing throughout the procedure under my direct supervision. COMPLICATIONS: None immediate. PROCEDURE: Informed written consent was obtained from the patient after  a thorough discussion of the procedural  risks, benefits and alternatives. All questions were addressed. Maximal Sterile Barrier Technique was utilized including caps, mask, sterile gowns, sterile gloves, sterile drape, hand hygiene and skin antiseptic. A timeout was performed prior to the initiation of the procedure. The patient was placed supine on the exam table. Limited CT of the abdomen and pelvis was performed for planning purposes. This demonstrated significant improvement of gas and fluid collection in the right lower quadrant. The surgically placed drainage catheter courses through this collection. Also noted was unchanged appearance a sizable posterior pelvic collection. Given these findings, the decision was made to proceed with placement of a transgluteal pelvic drainage catheter. The patient was repositioned in the lateral decubitus position. Skin entry site was marked, and the overlying skin was prepped and draped in the standard sterile fashion. Local analgesia was obtained with 1% lidocaine. Using intermittent CT fluoroscopy, a 19 gauge Yueh catheter was advanced towards the identified posterior pelvic fluid collection using a right transgluteal approach. Location was confirmed with CT and return of purulent material. Over an Amplatz wire, the percutaneous tract was serially dilated to accommodate a 12 French locking drainage catheter. Location was again confirmed with CT and return of additional fluid. Locking loop was formed, the drainage catheter was secured to the skin using silk suture and a dressing. It was attached to bulb suction. The patient tolerated the procedure well without immediate complication. IMPRESSION: 1. CT imaging demonstrated significant improvement in the right lower quadrant collection with surgical drainage catheter in place. Given these findings, the decision was made to proceed with drainage of the unchanged posterior pelvic collection. 2. Successful CT-guided placement of a 12 French locking drainage catheter  into the posterior pelvic collection via a right transgluteal approach. Drainage catheter placed to bulb suction. Electronically Signed   By: Olive Bass M.D.   On: 09/30/2022 16:21    Anti-infectives: Anti-infectives (From admission, onward)    Start     Dose/Rate Route Frequency Ordered Stop   10/01/22 2000  vancomycin (VANCOREADY) IVPB 1750 mg/350 mL        1,750 mg 175 mL/hr over 120 Minutes Intravenous Every 24 hours 09/30/22 1748     10/01/22 1945  meropenem (MERREM) 1 g in sodium chloride 0.9 % 100 mL IVPB        1 g 200 mL/hr over 30 Minutes Intravenous Every 8 hours 10/01/22 1851     09/30/22 1845  vancomycin (VANCOREADY) IVPB 2000 mg/400 mL        2,000 mg 200 mL/hr over 120 Minutes Intravenous  Once 09/30/22 1748 09/30/22 2317   09/30/22 0800  piperacillin-tazobactam (ZOSYN) IVPB 3.375 g  Status:  Discontinued        3.375 g 12.5 mL/hr over 240 Minutes Intravenous Every 8 hours 09/30/22 0701 10/01/22 1827   09/20/22 1402  ceFAZolin (ANCEF) IVPB 2g/100 mL premix        over 30 Minutes Intravenous Continuous PRN 09/20/22 1411 09/20/22 1402   09/20/22 0600  piperacillin-tazobactam (ZOSYN) IVPB 3.375 g  Status:  Discontinued        3.375 g 12.5 mL/hr over 240 Minutes Intravenous Every 8 hours 09/19/22 1943 09/28/22 1134   09/19/22 1800  amoxicillin-clavulanate (AUGMENTIN) 875-125 MG per tablet 1 tablet  Status:  Discontinued        1 tablet Oral Every 12 hours 09/19/22 1659 09/19/22 1939       Assessment/Plan: POD 8 s/p ex lap with right colectomy and end  ileostomy- TC B cell lymphoma -NPO/g-tube in place, continue TPN -SIRS after IR drain placement into pelvic collection.  Now on vent, per CCM. -continue abx -continue wound care- hopefully this remains without evisceration, don't really have good options at this point and going back to OR would not be ideal and likely no better solution -WBC better, fever curve better   FEN - OGT to LIWS/IVFs/TPN VTE - ok for  chemical prophylaxis from surgical standpoint, IVC filter ID - vanc/zosyn   Acute hypoxic respiratory failure - per CCM B-cell lymphoma SIRS - s/p IR drain placement LLE DVT SPCM - TNA ABL anemia   Discussed with his wife this will be difficult to get through and really will know more over next 2-3 days.  Ultimately he has to get chemotherapy for this disease and not sure when and if that will be possible.  Justin Cameron 10/02/2022

## 2022-10-03 ENCOUNTER — Inpatient Hospital Stay (HOSPITAL_COMMUNITY): Payer: Medicare Other

## 2022-10-03 DIAGNOSIS — J9601 Acute respiratory failure with hypoxia: Secondary | ICD-10-CM | POA: Diagnosis not present

## 2022-10-03 DIAGNOSIS — C8330 Diffuse large B-cell lymphoma, unspecified site: Secondary | ICD-10-CM | POA: Diagnosis not present

## 2022-10-03 LAB — COMPREHENSIVE METABOLIC PANEL
ALT: 13 U/L (ref 0–44)
AST: 18 U/L (ref 15–41)
Albumin: 2.1 g/dL — ABNORMAL LOW (ref 3.5–5.0)
Alkaline Phosphatase: 122 U/L (ref 38–126)
Anion gap: 7 (ref 5–15)
BUN: 21 mg/dL (ref 8–23)
CO2: 25 mmol/L (ref 22–32)
Calcium: 7.6 mg/dL — ABNORMAL LOW (ref 8.9–10.3)
Chloride: 101 mmol/L (ref 98–111)
Creatinine, Ser: 0.45 mg/dL — ABNORMAL LOW (ref 0.61–1.24)
GFR, Estimated: >60 mL/min (ref >60)
Glucose, Bld: 202 mg/dL — ABNORMAL HIGH (ref 70–99)
Potassium: 4.2 mmol/L (ref 3.5–5.1)
Sodium: 133 mmol/L — ABNORMAL LOW (ref 135–145)
Total Bilirubin: 0.8 mg/dL (ref 0.3–1.2)
Total Protein: 5.2 g/dL — ABNORMAL LOW (ref 6.5–8.1)

## 2022-10-03 LAB — CBC WITH DIFFERENTIAL/PLATELET
Abs Immature Granulocytes: 0.59 10*3/uL — ABNORMAL HIGH (ref 0.00–0.07)
Basophils Absolute: 0.1 10*3/uL (ref 0.0–0.1)
Basophils Relative: 0 %
Eosinophils Absolute: 0 10*3/uL (ref 0.0–0.5)
Eosinophils Relative: 0 %
HCT: 25.3 % — ABNORMAL LOW (ref 39.0–52.0)
Hemoglobin: 7.3 g/dL — ABNORMAL LOW (ref 13.0–17.0)
Immature Granulocytes: 3 %
Lymphocytes Relative: 2 %
Lymphs Abs: 0.5 10*3/uL — ABNORMAL LOW (ref 0.7–4.0)
MCH: 27.1 pg (ref 26.0–34.0)
MCHC: 28.9 g/dL — ABNORMAL LOW (ref 30.0–36.0)
MCV: 94.1 fL (ref 80.0–100.0)
Monocytes Absolute: 0.7 10*3/uL (ref 0.1–1.0)
Monocytes Relative: 3 %
Neutro Abs: 21.7 10*3/uL — ABNORMAL HIGH (ref 1.7–7.7)
Neutrophils Relative %: 92 %
Platelets: 222 10*3/uL (ref 150–400)
RBC: 2.69 MIL/uL — ABNORMAL LOW (ref 4.22–5.81)
RDW: 19.9 % — ABNORMAL HIGH (ref 11.5–15.5)
WBC: 23.5 10*3/uL — ABNORMAL HIGH (ref 4.0–10.5)
nRBC: 0.1 % (ref 0.0–0.2)

## 2022-10-03 LAB — PHOSPHORUS: Phosphorus: 2.8 mg/dL (ref 2.5–4.6)

## 2022-10-03 LAB — GLUCOSE, CAPILLARY
Glucose-Capillary: 122 mg/dL — ABNORMAL HIGH (ref 70–99)
Glucose-Capillary: 125 mg/dL — ABNORMAL HIGH (ref 70–99)
Glucose-Capillary: 126 mg/dL — ABNORMAL HIGH (ref 70–99)
Glucose-Capillary: 127 mg/dL — ABNORMAL HIGH (ref 70–99)
Glucose-Capillary: 131 mg/dL — ABNORMAL HIGH (ref 70–99)
Glucose-Capillary: 165 mg/dL — ABNORMAL HIGH (ref 70–99)
Glucose-Capillary: 184 mg/dL — ABNORMAL HIGH (ref 70–99)

## 2022-10-03 LAB — BODY FLUID CULTURE W GRAM STAIN: Special Requests: NORMAL

## 2022-10-03 LAB — MAGNESIUM: Magnesium: 1.9 mg/dL (ref 1.7–2.4)

## 2022-10-03 MED ORDER — VITAL HIGH PROTEIN PO LIQD
1000.0000 mL | ORAL | Status: DC
  Administered 2022-10-03: 1000 mL

## 2022-10-03 MED ORDER — FENTANYL CITRATE PF 50 MCG/ML IJ SOSY
25.0000 ug | PREFILLED_SYRINGE | INTRAMUSCULAR | Status: DC | PRN
  Administered 2022-10-03 – 2022-10-10 (×40): 50 ug via INTRAVENOUS
  Filled 2022-10-03 (×41): qty 1

## 2022-10-03 MED ORDER — OLANZAPINE 10 MG IM SOLR
5.0000 mg | Freq: Four times a day (QID) | INTRAMUSCULAR | Status: AC | PRN
  Administered 2022-10-03 – 2022-10-06 (×3): 5 mg via INTRAVENOUS
  Filled 2022-10-03 (×6): qty 10

## 2022-10-03 MED ORDER — TRACE MINERALS CU-MN-SE-ZN 300-55-60-3000 MCG/ML IV SOLN
INTRAVENOUS | Status: AC
  Filled 2022-10-03: qty 876

## 2022-10-03 MED ORDER — INSULIN ASPART 100 UNIT/ML IJ SOLN: 0.0000 [IU] | INTRAMUSCULAR | Status: DC

## 2022-10-03 MED ORDER — INSULIN ASPART 100 UNIT/ML IJ SOLN
0.0000 [IU] | INTRAMUSCULAR | Status: DC
  Administered 2022-10-03 – 2022-10-06 (×7): 1 [IU] via SUBCUTANEOUS

## 2022-10-03 MED ORDER — SODIUM CHLORIDE 0.9 % IV SOLN
2.0000 g | INTRAVENOUS | Status: DC
  Administered 2022-10-03 – 2022-10-10 (×8): 2 g via INTRAVENOUS
  Filled 2022-10-03 (×8): qty 20

## 2022-10-03 NOTE — Progress Notes (Signed)
Pt extubated per MD to 10lpm hfnc with no complications. No stridor or distress  noted

## 2022-10-03 NOTE — Progress Notes (Signed)
   10/03/22 1004  Spiritual Encounters  Type of Visit Follow up  Care provided to: Pt and family  Conversation partners present during encounter Nurse;Physician  Referral source Chaplain team  Reason for visit Routine spiritual support  OnCall Visit No  Spiritual Framework  Presenting Themes Meaning/purpose/sources of inspiration;Goals in life/care;Values and beliefs;Impactful experiences and emotions;Courage hope and growth  Values/beliefs Faith in God and that God is in control; "For this I have Jesus"  Community/Connection Family;Faith community;Spiritual leader  Patient Stress Factors Health changes  Family Stress Factors Exhausted;Major life changes  Goals  Self/Personal Goals complete healig for the patient; "baby steps" toward improvement  Interventions  Spiritual Care Interventions Made Established relationship of care and support;Compassionate presence;Reflective listening;Narrative/life review;Explored values/beliefs/practices/strengths;Meaning making;Prayer;Encouragement  Intervention Outcomes  Outcomes Connection to spiritual care;Awareness around self/spiritual resourses;Connection to values and goals of care;Awareness of support;Reduced isolation;Reduced anxiety;Patient family open to resources;Connected to spiritual community  Spiritual Care Plan  Spiritual Care Issues Still Outstanding Chaplain will continue to follow  Follow up plan  Chaplain will refer to chaplain team to continue to follow up   Chaplain provided spiritual and emotional support for the patient's wife. Wife [name] is hoping for improvement in the patient. She is looking for "baby steps" of progress in the patient's condition. Wife has a strong faith, family, faith community and spiritual leader support. Wife is aware that "decisions" may have to be made and is "leaning in" and "trusting God" to guide her if and when the time comes to make health care decisions for the patient. Chaplain will hand off care to  chaplain team.   Arlyce Dice, Chaplain Resident

## 2022-10-03 NOTE — Progress Notes (Signed)
NAME:  Justin Cameron, MRN:  [id number], DOB:  08/22/1955, LOS: 14 ADMISSION DATE:  09/19/2022, CONSULTATION DATE:  09/24/22 REFERRING MD:  Jarvis Newcomer, CHIEF COMPLAINT:  Abd pain   History of Present Illness:  67 year old man w/ hx of OSA, recently diagnosed large B cell lymphoma causing threatened large bowel obstruction.  Came in after chemo b/c more bloating/pain.  NGT placed, O2 needs went from RA to 15LPM so PCCM consulted.  Denies any cough.  +dyspneic with minimal exertion.  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  Interim History / Subjective:  No acute events overnight. Pressors and vent weaning well.  Able to communicate with head shakes and nods Denies pain Afebrile 16L positive for the admission, but likely significant insensible fluid loss.   Objective   Blood pressure (!) 148/65, pulse 60, temperature 98.4 F (36.9 C), resp. rate (!) 24, height 5\' 8"  (1.727 m), weight 96.9 kg, SpO2 97 %. CVP:  [9 mmHg-18 mmHg] 15 mmHg  Vent Mode: PRVC FiO2 (%):  [30 %] 30 % Set Rate:  [24 bmp] 24 bmp Vt Set:  [540 mL] 540 mL PEEP:  [5 cmH20] 5 cmH20 Plateau Pressure:  [16 cmH20-21 cmH20] 21 cmH20   Intake/Output Summary (Last 24 hours) at 10/03/2022 0806 Last data filed at 10/03/2022 2952 Gross per 24 hour  Intake 3985.03 ml  Output 2330 ml  Net 1655.03 ml    Filed Weights   10/02/22 0500 10/03/22 0500  Weight: 98.4 kg 96.9 kg    Examination:  General: Critically ill adult male in NAD on vent HENT: [place]/AT, PERRL, no JVD Cardio: RRR, no  MRG Pulm: Clear. Weaning well on 5/5 30 % Abd: Midline surgical incision with abdominal dressing. Ileostomy draining a small amount of dark liquid. Anterior surgical drain with a small amount of dark SS fluid. Posterior IR drain with a small amount of lighter SS fluid.  GU: [place] Skin: Surgical wound as above Extremities: Anasarca Neuro: RASS -0 to -1. Communicates by nodding and shaking head. Following commands in all 4 extremities.   Body fluid culture 6/4  growing GNR >>>  Resolved Hospital Problem list   Postoperative vent management-extubated successfully  Assessment & Plan:  Septic Shock secondary to intraabdominal RLQ abscess in setting of diffuse large Bcell lymphoma mass. Adrenal insufficiency: bilateral adrenal gland tumors.  5/28 Exploratory laparotomy with right hemicolectomy and end ileostomy along with JP drain placement. 6/3 CT guided JP drain placed by IR on 6/3 in RLQ abscess measuring 23 x 10 x16 cm After placement, patient developed rigors and profound SIRS response  P:  Continue Merrem. Can DC vancomycin (GNR) MAP > 65 goal, levophed and vasopressin. NE dose improved from 22 yesterday to today with the addition of steroids.  Stress dose hydrocortisone > significant response  Monitor drain output Appreciate CCS  Acute hypoxic and hypercarbic respiratory failure in setting of septic shock  Sleep Apnea hx  P: Full vent support VAP bundle Tolerating SBT. Could possibly extubate today.  Fentanyl and Precedex infusions for RASS goal 0 to -1.   Anemia  in setting of s/p exlap and drain placement  P: Transfuse if hemoglobin <7, borderline today.  Continue to monitor output from JP drains  DVT Left Lower Extremity s/p IVC  Not candidate at this time for anticoagulation per oncology  P:  IVC filter placed 5/29  Continue supportive care  Continue SCDS + lovenox added  Atrial Fib RVR Suspecting uncontrolled due to septic shock and hypovolemia >  improving P: DC amiodarone Telemetry monitoring   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  Hx of gout  P: Allopurinol on hold while NPO Oncology following peripherally  Severe Protein Malnutrition  P:  Continue TPN, appreciate pharmacy asssitance  Will start trickle feeds. Wait to see if he gets extubated.  Q 4 CBGs   Best Practice (right click and "Reselect all SmartList Selections" daily)  Diet: TPN DVT prophylaxis: hold for now, SCDs  GI prophylaxis: PPI Code Status: full  Lines: Port, PICC, ETT, Foley, Drain x 2, arterial line.   Family Communication: Wife updated at bedside this morning.  Disposition: ICU     Critical care time 43 minutes   [name], AGACNP-BC [facility]  See Amion for personal pager PCCM on call pager [phone] until 7pm. Please call Elink 7p-7a. [phone]  10/03/2022 8:06 AM

## 2022-10-03 NOTE — IPAL (Signed)
  Interdisciplinary Goals of Care Family Meeting   Date carried out: 10/03/2022  Location of the meeting: Bedside  Member's involved: Physician, Nurse Practitioner, and Family Member or next of kin  Durable Power of Attorney or acting medical decision maker: Wife - Gina Backstrom  Discussion: We discussed goals of care for [employer] .  [name] is hopeful he will recover to the extent he can receive chemotherapy, which is ultimately what he needs, but understands it will be a long road and may not be possible even with the best of medical care. As we prepare to extubate Vince, she understands that if he were to require re-intubation this would be a significant setback that further decreases the likelihood he will receive chemotherapy in a reasonable timeframe. With this in mind and considering her understanding of his prior wishes, she chooses to not re-intubate should he fail extubation and to not pursue CPR should he unfortunately suffer a cardiac arrest. Full medical care otherwise.   Code status:   Code Status: DNR   Disposition: Continue current acute care  Time spent for the meeting: 18 minutes    Duayne Cal, NP  10/03/2022, 11:18 AM

## 2022-10-03 NOTE — Progress Notes (Signed)
PHARMACY - TOTAL PARENTERAL NUTRITION CONSULT NOTE   Indication: Prolonged ileus  Patient Measurements: Height: 5\' 8"  (172.7 cm) Weight: 96.9 kg (213 lb 10 oz) IBW/kg (Calculated) : 68.4 TPN AdjBW (KG): 81 Body mass index is 32.48 kg/m.  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  He was supposed to start chemo, but developed severe acute abdominal pain on 5/28.  NG tube placed, imaging to confirm placement shows possible free air and he was taken to OR on 5/28. Pharmacy consulted to dose TPN for prolonged ileus.    CT with increased fluid collection in right abdomen around surgical drain. IR placed drain 6/3. Patient later intubated and started on vasopressors. Patient with extravascular volume, given albumin 6/4. TPN concentrated 6/4 in attempt to reduce fluid burden. Patient remains intubated but with reduced vasopressor requirements on 6/5. Fever curve and WBC starting to improve.  Glucose / Insulin: No hx DM, no PTA medications.   - Hydrocortisone 100 mg BID started on 6/5 - CBGs improved, mostly within goal < 150, range 140-184 - sensitive SSI: 10 units / 24 hrs Electrolytes: Na decreased to 133, others WNL including corrected Ca 9.12.  Phos 2.8 and Mag 1.9 on lower end. Renal: SCr low, BUN WNL Hepatic: AST/ALT, Tbili WNL. TG 201 Intake / Output: - Drains 155 mL - NG 450 mL - Urine 1725 mL - ostomy: none GI Imaging:  -6/2 CT abd/pelvis - large irregular fluid collection in the right abdomen lies in the location of the previous seen large necrotic mass -5/28  Lucency in the right upper quadrant was not seen on the previous images. Free intraperitoneal air is not excluded.  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  - 6/3 IR drainage catheter placed into posterior pelvic fluid  collection  Central access: Port (09/20/22), Triple lumen PICC placed 5/29 TPN start date: 5/29  Nutritional Goals: Goal TPN rate of 75 mL/hr provides 131 g of protein and 2228 kcals per day (Note rate reduction in attempt to reduce fluid burden)  RD Assessment: updated 6/4 following intubation Estimated Needs Total Energy Estimated Needs: 2250-2450 kcals Total Protein Estimated Needs: 120-140 grams Total Fluid Estimated Needs: >/= 2.2L  Current Nutrition: NPO 6/3  Plan:  -Maintain concentrated TPN for now  -Continue TPN at 75 mL/hr -Electrolytes in TPN: Na 125 mEq/L, K 60 mEq/L, Ca 3 mEq/L, Mg 8 mEq/L, and Phos 20 mmol/L. Cl:Ac 1:1 -Add standard MVI and trace elements to TPN -Continue sensitive SSI q4h and adjust as needed  -Monitor TPN labs on Mon/Thurs and PRN   Lynann Beaver PharmD, BCPS [facility] main pharmacy [phone] 10/03/2022 7:08 AM

## 2022-10-03 NOTE — Progress Notes (Addendum)
eLink Physician-Brief Progress Note Patient Name: Justin Cameron DOB: [birth year] MRN: [id number]   Date of Service  10/03/2022  HPI/Events of Note  67 year old male that was initially admitted with septic shock with E. coli intra-abdominal abscess complicated by respiratory failure requiring mechanical ventilation.  Currently has a DVT in the left lower extremity status post IVC filter.  Extubated earlier today.  Ongoing Agitation, qtc 504  eICU Interventions  Discontinued GI prophylaxis, sedation, analgesic drips, and vasoactive agents.  Patient is currently not requiring any continuous sedation or vasoactive agents.   Will add PRN olanzapine   0559 - K 3.2, Cr 0.52, on TPN.  KCl IV ordered.  TPN adjustment deferred to pharmacy/nutrition.  Intervention Category Intermediate Interventions: Best-practice therapies (e.g. DVT, beta blocker, etc.);Respiratory distress - evaluation and management  Jep Dyas 10/03/2022, 9:05 PM

## 2022-10-03 NOTE — Progress Notes (Signed)
9 Days Post-Op   Subjective/Chief Complaint: Opens eyes on vent   Objective: Vital signs in last 24 hours: Temp:  [98.2 F (36.8 C)-100.9 F (38.3 C)] 98.4 F (36.9 C) (06/06 0745) Pulse Rate:  [54-94] 60 (06/06 0745) Resp:  [19-36] 24 (06/06 0745) BP: (131-161)/(52-66) 148/65 (06/06 0715) SpO2:  [94 %-99 %] 98 % (06/06 0808) Arterial Line BP: (120-186)/(43-57) 149/51 (06/06 0745) FiO2 (%):  [30 %] 30 % (06/06 0808) Weight:  [96.9 kg] 96.9 kg (06/06 0500) Last BM Date : 10/01/22  Intake/Output from previous day: 06/05 0701 - 06/06 0700 In: 4143.9 [I.V.:3411.4; NG/GT:105; IV Piggyback:607.5] Out: 2330 [Urine:1725; Emesis/NG output:450; Drains:155] Intake/Output this shift: No intake/output data recorded.  Ab approp tender, drains as expected, wound with necrotic fascial edges and dehiscence with visible suture, no evisceration, ostomy functional    Lab Results:  Recent Labs    10/02/22 0552 10/02/22 1830 10/03/22 0614  WBC 19.7*  --  23.5*  HGB 7.2* 7.7* 7.3*  HCT 23.5* 24.7* 25.3*  PLT 196  --  222   BMET Recent Labs    10/02/22 0956 10/03/22 0702  NA 136 133*  K 3.7 4.2  CL 102 101  CO2 25 25  GLUCOSE 140* 202*  BUN 23 21  CREATININE 0.65 0.45*  CALCIUM 7.3* 7.6*   PT/INR Recent Labs    09/30/22 1825  LABPROT 15.6*  INR 1.2   ABG Recent Labs    09/30/22 2145 10/01/22 1118  PHART 7.32* 7.35  HCO3 24.7 27.1    Studies/Results: DG Chest Port 1 View  Result Date: 10/02/2022 CLINICAL DATA:  Dyspnea. EXAM: PORTABLE CHEST 1 VIEW COMPARISON:  September 30, 2022. FINDINGS: Stable cardiomediastinal silhouette. Endotracheal and nasogastric tubes are unchanged in position. Right internal jugular Port-A-Cath is unchanged. Right-sided PICC line is unchanged. Minimal bibasilar subsegmental atelectasis is noted. Bony thorax is unremarkable. IMPRESSION: Stable support apparatus. Minimal bibasilar subsegmental atelectasis. Electronically Signed   By: Lupita Raider  M.D.   On: 10/02/2022 11:22    Anti-infectives: Anti-infectives (From admission, onward)    Start     Dose/Rate Route Frequency Ordered Stop   10/01/22 2000  vancomycin (VANCOREADY) IVPB 1750 mg/350 mL        1,750 mg 175 mL/hr over 120 Minutes Intravenous Every 24 hours 09/30/22 1748     10/01/22 1945  meropenem (MERREM) 1 g in sodium chloride 0.9 % 100 mL IVPB        1 g 200 mL/hr over 30 Minutes Intravenous Every 8 hours 10/01/22 1851     09/30/22 1845  vancomycin (VANCOREADY) IVPB 2000 mg/400 mL        2,000 mg 200 mL/hr over 120 Minutes Intravenous  Once 09/30/22 1748 09/30/22 2317   09/30/22 0800  piperacillin-tazobactam (ZOSYN) IVPB 3.375 g  Status:  Discontinued        3.375 g 12.5 mL/hr over 240 Minutes Intravenous Every 8 hours 09/30/22 0701 10/01/22 1827   09/20/22 1402  ceFAZolin (ANCEF) IVPB 2g/100 mL premix        over 30 Minutes Intravenous Continuous PRN 09/20/22 1411 09/20/22 1402   09/20/22 0600  piperacillin-tazobactam (ZOSYN) IVPB 3.375 g  Status:  Discontinued        3.375 g 12.5 mL/hr over 240 Minutes Intravenous Every 8 hours 09/19/22 1943 09/28/22 1134   09/19/22 1800  amoxicillin-clavulanate (AUGMENTIN) 875-125 MG per tablet 1 tablet  Status:  Discontinued        1 tablet Oral Every 12  hours 09/19/22 1659 09/19/22 1939       Assessment/Plan: POD 9 s/p ex lap with right colectomy and end ileostomy- TC B cell lymphoma -NPO/g-tube in place, continue TPN -SIRS after IR drain placement into pelvic collection.  Now on vent, per CCM. Slowly getting better, on decreased pressors -continue abx -continue wound care- hopefully this remains without evisceration, don't really have good options at this point and going back to OR would not be ideal and likely no better solution, likely will place vac tomorrow with white sponge/black sponge   FEN - OGT to LIWS/IVFs/TPN, could feed enterally at this point from my standpoint VTE - ok for chemical prophylaxis from surgical  standpoint, IVC filter ID - vanc/zosyn   Acute hypoxic respiratory failure - per CCM B-cell lymphoma SIRS - s/p IR drain placement LLE DVT SPCM - TNA ABL anemia   Justin Cameron 10/03/2022

## 2022-10-03 NOTE — Progress Notes (Signed)
Referring Physician(s): Darnelle Spangle  Supervising Physician: Simonne Come  Patient Status:  [facility] - In-pt  Chief Complaint:  Abdominal pain/abscess/lymphoma   Subjective: Pt remains intubated but is opening eyes, following some commands, weaning pressors, considering extubation today per nurse   Allergies: Patient has no allergy information on record.  Medications: Prior to Admission medications   Medication Sig Start Date End Date Taking? Authorizing Provider  ALPRAZolam (XANAX) 0.25 MG tablet Take 0.125-0.25 mg by mouth 3 (three) times daily as needed for anxiety.   Yes [provider]  colchicine 0.6 MG tablet Take 0.6 mg by mouth daily as needed (gout). 07/19/22  Yes [provider]  diphenhydramine-acetaminophen (TYLENOL PM) 25-500 MG TABS tablet Take 1 tablet by mouth at bedtime.   Yes [provider]  diphenhydrAMINE-zinc acetate (BENADRYL EXTRA STRENGTH) cream Apply 1 Application topically 3 (three) times daily as needed for itching. 09/18/22  Yes Charlynne Pander, MD  ondansetron (ZOFRAN) 4 MG tablet TAKE 1 TABLET(4 MG) BY MOUTH EVERY 8 HOURS AS NEEDED FOR NAUSEA OR VOMITING 08/19/22  Yes Jenel Lucks, MD  simvastatin (ZOCOR) 20 MG tablet Take 20 mg by mouth every evening.   Yes [provider]  traZODone (DESYREL) 50 MG tablet Take 50-100 mg by mouth at bedtime as needed for sleep. 07/26/22  Yes [provider]  amoxicillin-clavulanate (AUGMENTIN) 875-125 MG tablet Take 1 tablet by mouth every 12 (twelve) hours. 09/18/22   Charlynne Pander, MD  dicyclomine (BENTYL) 20 MG tablet TAKE 1 TABLET(20 MG) BY MOUTH EVERY 6 HOURS Patient not taking: Reported on 09/19/2022 08/19/22   Jenel Lucks, MD     Vital Signs: BP (!) 148/65   Pulse 60   Temp 98.4 F (36.9 C)   Resp (!) 24   Ht 5\' 8"  (1.727 m)   Wt 213 lb 10 oz (96.9 kg)   SpO2 96%   BMI 32.48 kg/m   Physical Exam: intubated; rt TG drain intact, output about  15-20 cc serosang fluid; drain flushed without difficulty, return= flush amount; rt surgical drain with dark bloody fluid in JP  Imaging: DG Chest Port 1 View  Result Date: 10/02/2022 CLINICAL DATA:  Dyspnea. EXAM: PORTABLE CHEST 1 VIEW COMPARISON:  September 30, 2022. FINDINGS: Stable cardiomediastinal silhouette. Endotracheal and nasogastric tubes are unchanged in position. Right internal jugular Port-A-Cath is unchanged. Right-sided PICC line is unchanged. Minimal bibasilar subsegmental atelectasis is noted. Bony thorax is unremarkable. IMPRESSION: Stable support apparatus. Minimal bibasilar subsegmental atelectasis. Electronically Signed   By: Lupita Raider M.D.   On: 10/02/2022 11:22   DG CHEST PORT 1 VIEW  Result Date: 09/30/2022 CLINICAL DATA:  OG tube and endotracheal tube placement. EXAM: PORTABLE CHEST 1 VIEW COMPARISON:  09/24/2022 FINDINGS: An endotracheal tube with tip 2.5 cm above the carina, RIGHT PICC line with tip difficult to visualize but appears to overlie the RIGHT atrium and a RIGHT Port-A-Cath with tip overlying the RIGHT atrium as well. An enteric tube is noted with tip overlying the mid stomach. Bibasilar opacities/atelectasis noted. There is no evidence of pneumothorax or large pleural effusion. IMPRESSION: 1. RIGHT PICC line placement but otherwise unchanged appearance of the chest. Other support apparatus as described with mild bibasilar opacities/atelectasis. Electronically Signed   By: Harmon Pier M.D.   On: 09/30/2022 17:26   CT GUIDED PERITONEAL/RETROPERITONEAL FLUID DRAIN BY PERC CATH  Result Date: 09/30/2022 INDICATION: [id number] Postprocedural intraabdominal abscess [id number] EXAM: CT-guided pelvic drain placement TECHNIQUE: Multidetector CT imaging  of the abdomen and pelvis was performed following the standard protocol without IV contrast. RADIATION DOSE REDUCTION: This exam was performed according to the departmental dose-optimization program which includes automated exposure  control, adjustment of the mA and/or kV according to patient size and/or use of iterative reconstruction technique. MEDICATIONS: The patient is currently admitted to the hospital and receiving intravenous antibiotics. The antibiotics were administered within an appropriate time frame prior to the initiation of the procedure. ANESTHESIA/SEDATION: Moderate (conscious) sedation was employed during this procedure. A total of Versed 2 mg and Fentanyl 100 mcg was administered intravenously by the radiology nurse. Total intra-service moderate Sedation Time: 19 minutes. The patient's level of consciousness and vital signs were monitored continuously by radiology nursing throughout the procedure under my direct supervision. COMPLICATIONS: None immediate. PROCEDURE: Informed written consent was obtained from the patient after a thorough discussion of the procedural risks, benefits and alternatives. All questions were addressed. Maximal Sterile Barrier Technique was utilized including caps, mask, sterile gowns, sterile gloves, sterile drape, hand hygiene and skin antiseptic. A timeout was performed prior to the initiation of the procedure. The patient was placed supine on the exam table. Limited CT of the abdomen and pelvis was performed for planning purposes. This demonstrated significant improvement of gas and fluid collection in the right lower quadrant. The surgically placed drainage catheter courses through this collection. Also noted was unchanged appearance a sizable posterior pelvic collection. Given these findings, the decision was made to proceed with placement of a transgluteal pelvic drainage catheter. The patient was repositioned in the lateral decubitus position. Skin entry site was marked, and the overlying skin was prepped and draped in the standard sterile fashion. Local analgesia was obtained with 1% lidocaine. Using intermittent CT fluoroscopy, a 19 gauge Yueh catheter was advanced towards the identified  posterior pelvic fluid collection using a right transgluteal approach. Location was confirmed with CT and return of purulent material. Over an Amplatz wire, the percutaneous tract was serially dilated to accommodate a 12 French locking drainage catheter. Location was again confirmed with CT and return of additional fluid. Locking loop was formed, the drainage catheter was secured to the skin using silk suture and a dressing. It was attached to bulb suction. The patient tolerated the procedure well without immediate complication. IMPRESSION: 1. CT imaging demonstrated significant improvement in the right lower quadrant collection with surgical drainage catheter in place. Given these findings, the decision was made to proceed with drainage of the unchanged posterior pelvic collection. 2. Successful CT-guided placement of a 12 French locking drainage catheter into the posterior pelvic collection via a right transgluteal approach. Drainage catheter placed to bulb suction. Electronically Signed   By: Olive Bass M.D.   On: 09/30/2022 16:21   CT ABDOMEN PELVIS W CONTRAST  Result Date: 09/29/2022 CLINICAL DATA:  Abdominal pain.  Postop. EXAM: CT ABDOMEN AND PELVIS WITH CONTRAST TECHNIQUE: Multidetector CT imaging of the abdomen and pelvis was performed using the standard protocol following bolus administration of intravenous contrast. RADIATION DOSE REDUCTION: This exam was performed according to the departmental dose-optimization program which includes automated exposure control, adjustment of the mA and/or kV according to patient size and/or use of iterative reconstruction technique. CONTRAST:  80mL OMNIPAQUE IOHEXOL 300 MG/ML  SOLN COMPARISON:  09/18/2022. FINDINGS: Lower chest: Small right and trace left pleural effusions. Associated dependent lower lobe atelectasis, also greater on the right. Small focus of atelectasis at the base of the left upper lobe lingula. Hepatobiliary: Liver normal in size and  attenuation. Multiple small low-attenuation liver masses consistent with cysts stable from the prior CT. Normal gallbladder. No bile duct dilation. Pancreas: Unremarkable. Spleen: Normal. Adrenals/Urinary Tract: Bilateral adrenal masses, unchanged from the recent prior CT, right 5.1 cm, 2 on the left 3.6 and 4.2 cm. Normal kidneys, ureters and bladder. Stomach/Bowel: Since the prior CT, abdominal surgery has been performed. There is a midline incision with the skin and subcutaneous fat open, fascia closed. Colon has been ligated at the level of the right mid transverse section. The ileum extends into a right anterior mid abdomen ostomy. There are postoperative collections. The largest is a heterogeneous right abdominal collection in the area of the necrotic mass. Soft tissue attenuation along some of the margins of this collection may reflect necrotic tissue or decompressed small bowel or a combination. The collection contains fluid and air. It measures approximately 23 cm from superior to inferior by 16 x 10 cm transversely. A drainage catheter lies within the mid to upper portion of this collection. Small subcapsular collection lies along the right liver margin. A subcapsular component of the larger collection lies along the anterior inferior aspect of the right liver lobe. Fluid is also seen distending the posterior pelvic recess. Remaining colon shows a generalized mild increase in the colonic stool burden, but no wall thickening or convincing inflammation. There is no small bowel dilation to suggest obstruction. Stomach is moderately distended, but without wall thickening. Tiny bubbles of free air lie just deep to the upper abdominal incision line. Vascular/Lymphatic: Well-positioned vena cava filter. Aortic atherosclerosis. Prominent gastrohepatic ligament lymph nodes. Mesenteric nodes noted on the prior CT are less well-defined due to postoperative fluid/edema. Reproductive: Unremarkable. Other: None.  Musculoskeletal: No fracture or acute finding. No osteoblastic or osteolytic lesions. IMPRESSION: 1. Status post abdominal surgery since the previous CT scan. Large irregular fluid collection in the right abdomen lies in the location of the previous seen large necrotic mass. Some of this collection may reflect the residual mass. Collection extends from the subcapsular inferior right liver to the right pelvis measuring 23 x 10 x 16 cm. Surgical drain lies within the upper to mid aspect of this collection. 2. 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 No evidence of bowel obstruction. 3. Findings of metastatic disease noted on the recent prior CT are unchanged. Electronically Signed   By: Amie Portland M.D.   On: 09/29/2022 16:02    Labs:  CBC: Recent Labs    10/01/22 0414 10/01/22 0530 10/02/22 0552 10/02/22 1830 10/03/22 0614  WBC SPECIMEN CONTAMINATED, UNABLE TO PERFORM TEST(S). 31.0* 19.7*  --  23.5*  HGB SPECIMEN CONTAMINATED, UNABLE TO PERFORM TEST(S). 8.4* 7.2* 7.7* 7.3*  HCT SPECIMEN CONTAMINATED, UNABLE TO PERFORM TEST(S). 27.6* 23.5* 24.7* 25.3*  PLT SPECIMEN CONTAMINATED, UNABLE TO PERFORM TEST(S). 212 196  --  222    COAGS: Recent Labs    09/19/22 1713 09/30/22 1825  INR 1.1 1.2  APTT 29 27    BMP: Recent Labs    10/01/22 0414 10/01/22 0530 10/02/22 0956 10/03/22 0702  NA SPECIMEN CONTAMINATED, UNABLE TO PERFORM TEST(S). 138 136 133*  K SPECIMEN CONTAMINATED, UNABLE TO PERFORM TEST(S). 3.6 3.7 4.2  CL SPECIMEN CONTAMINATED, UNABLE TO PERFORM TEST(S). 105 102 101  CO2 SPECIMEN CONTAMINATED, UNABLE TO PERFORM TEST(S). 23 25 25   GLUCOSE SPECIMEN CONTAMINATED, UNABLE TO PERFORM TEST(S). 240* 140* 202*  BUN SPECIMEN CONTAMINATED, UNABLE TO PERFORM TEST(S). 26* 23 21  CALCIUM SPECIMEN CONTAMINATED, UNABLE TO  PERFORM TEST(S). 6.7* 7.3* 7.6*  CREATININE SPECIMEN CONTAMINATED, UNABLE TO PERFORM TEST(S). 0.78 0.65 0.45*  GFRNONAA  SPECIMEN CONTAMINATED, UNABLE TO PERFORM TEST(S). >60 >60 >60  GFRAA SPECIMEN CONTAMINATED, UNABLE TO PERFORM TEST(S).  --   --   --     LIVER FUNCTION TESTS: Recent Labs    10/01/22 0414 10/01/22 0530 10/02/22 0956 10/03/22 0702  BILITOT SPECIMEN CONTAMINATED, UNABLE TO PERFORM TEST(S). 0.9 1.2 0.8  AST SPECIMEN CONTAMINATED, UNABLE TO PERFORM TEST(S). 27 21 18   ALT SPECIMEN CONTAMINATED, UNABLE TO PERFORM TEST(S). 13 11 13   ALKPHOS SPECIMEN CONTAMINATED, UNABLE TO PERFORM TEST(S). 113 90 122  PROT SPECIMEN CONTAMINATED, UNABLE TO PERFORM TEST(S). 4.2* 4.9* 5.2*  ALBUMIN SPECIMEN CONTAMINATED, UNABLE TO PERFORM TEST(S). 1.6* 2.1* 2.1*    Assessment and Plan: 67 y.o. male with PMH HLD, arthritis, OSA and newly diagnosed DLBCL who was admitted to [facility] from [facility] on 5/23 with persistent abd pain, fatigue, nausea, neck pain, lightheadedness, night sweats, weight loss, poor oral intake. He is s/p expl lap with rt hemicolectomy and end ileostomy on 5/28 for perforation of rt colon into RP due to extensive B cell lymphoma involvement . Also with hx BLE DVT. He is s/p port a cath placement on 5/24 and IVC filter placement on 09/25/22 ; s/p right TG approach drain placement (12 fr to JP) yesterday into post pelvic fluid collection ; CT at that time demonstrated significant improvement in the right lower quadrant collection with surgical drainage catheter in place; afebrile ; WBC 23.7(19.7)- getting solucortef, hgb 7.3(7.7), creat 0.45, drain fl cx- e coli; cont current tx; consider f/u CT once drain outputs are minimal over 3-4 day span or if clinically worsens   Electronically Signed: D. Jeananne Rama, PA-C 10/03/2022, 11:11 AM   I spent a total of 15 Minutes at the the patient's bedside AND on the patient's hospital floor or unit, greater than 50% of which was counseling/coordinating care for pelvic abscess drain    Patient ID: Justin Cameron, male   DOB: Feb 22, 1956, 67 y.o.   MRN:  [id number]

## 2022-10-04 DIAGNOSIS — C8330 Diffuse large B-cell lymphoma, unspecified site: Secondary | ICD-10-CM | POA: Diagnosis not present

## 2022-10-04 LAB — CBC WITH DIFFERENTIAL/PLATELET
Abs Immature Granulocytes: 0.99 10*3/uL — ABNORMAL HIGH (ref 0.00–0.07)
Basophils Absolute: 0 10*3/uL (ref 0.0–0.1)
Basophils Relative: 0 %
Eosinophils Absolute: 0 10*3/uL (ref 0.0–0.5)
Eosinophils Relative: 0 %
HCT: 22.2 % — ABNORMAL LOW (ref 39.0–52.0)
Hemoglobin: 7.1 g/dL — ABNORMAL LOW (ref 13.0–17.0)
Immature Granulocytes: 5 %
Lymphocytes Relative: 3 %
Lymphs Abs: 0.5 10*3/uL — ABNORMAL LOW (ref 0.7–4.0)
MCH: 25.8 pg — ABNORMAL LOW (ref 26.0–34.0)
MCHC: 32 g/dL (ref 30.0–36.0)
MCV: 80.7 fL (ref 80.0–100.0)
Monocytes Absolute: 0.6 10*3/uL (ref 0.1–1.0)
Monocytes Relative: 3 %
Neutro Abs: 16.8 10*3/uL — ABNORMAL HIGH (ref 1.7–7.7)
Neutrophils Relative %: 89 %
Platelets: 250 10*3/uL (ref 150–400)
RBC: 2.75 MIL/uL — ABNORMAL LOW (ref 4.22–5.81)
RDW: 18.9 % — ABNORMAL HIGH (ref 11.5–15.5)
WBC: 18.9 10*3/uL — ABNORMAL HIGH (ref 4.0–10.5)
nRBC: 0.1 % (ref 0.0–0.2)

## 2022-10-04 LAB — GLUCOSE, CAPILLARY
Glucose-Capillary: 120 mg/dL — ABNORMAL HIGH (ref 70–99)
Glucose-Capillary: 115 mg/dL — ABNORMAL HIGH (ref 70–99)
Glucose-Capillary: 122 mg/dL — ABNORMAL HIGH (ref 70–99)
Glucose-Capillary: 109 mg/dL — ABNORMAL HIGH (ref 70–99)
Glucose-Capillary: 111 mg/dL — ABNORMAL HIGH (ref 70–99)
Glucose-Capillary: 119 mg/dL — ABNORMAL HIGH (ref 70–99)

## 2022-10-04 LAB — BASIC METABOLIC PANEL
Anion gap: 10 (ref 5–15)
BUN: 24 mg/dL — ABNORMAL HIGH (ref 8–23)
CO2: 26 mmol/L (ref 22–32)
Calcium: 8 mg/dL — ABNORMAL LOW (ref 8.9–10.3)
Chloride: 107 mmol/L (ref 98–111)
Creatinine, Ser: 0.52 mg/dL — ABNORMAL LOW (ref 0.61–1.24)
GFR, Estimated: >60 mL/min (ref >60)
Glucose, Bld: 128 mg/dL — ABNORMAL HIGH (ref 70–99)
Potassium: 3.3 mmol/L — ABNORMAL LOW (ref 3.5–5.1)
Sodium: 143 mmol/L (ref 135–145)

## 2022-10-04 LAB — PHOSPHORUS
Phosphorus: 2.6 mg/dL (ref 2.5–4.6)
Phosphorus: 3.5 mg/dL (ref 2.5–4.6)

## 2022-10-04 LAB — MAGNESIUM
Magnesium: 2 mg/dL (ref 1.7–2.4)
Magnesium: 2.2 mg/dL (ref 1.7–2.4)

## 2022-10-04 MED ORDER — HYDROCORTISONE SOD SUC (PF) 100 MG IJ SOLR
50.0000 mg | Freq: Two times a day (BID) | INTRAMUSCULAR | Status: DC
  Administered 2022-10-04 – 2022-10-06 (×5): 50 mg via INTRAVENOUS
  Filled 2022-10-04 (×5): qty 2

## 2022-10-04 MED ORDER — POTASSIUM CHLORIDE 10 MEQ/50ML IV SOLN
10.0000 meq | INTRAVENOUS | Status: AC
  Administered 2022-10-04 (×6): 10 meq via INTRAVENOUS
  Filled 2022-10-04 (×5): qty 50

## 2022-10-04 MED ORDER — PROSOURCE TF20 ENFIT COMPATIBL EN LIQD
60.0000 mL | Freq: Two times a day (BID) | ENTERAL | Status: DC
  Administered 2022-10-05 – 2022-10-07 (×6): 60 mL
  Filled 2022-10-04 (×6): qty 60

## 2022-10-04 MED ORDER — TRACE MINERALS CU-MN-SE-ZN 300-55-60-3000 MCG/ML IV SOLN
INTRAVENOUS | Status: AC
  Filled 2022-10-04: qty 876

## 2022-10-04 MED ORDER — POTASSIUM CHLORIDE 10 MEQ/50ML IV SOLN
INTRAVENOUS | Status: AC
  Administered 2022-10-04: 10 meq
  Filled 2022-10-04: qty 50

## 2022-10-04 MED ORDER — VITAL 1.5 CAL PO LIQD
1000.0000 mL | ORAL | Status: DC
  Administered 2022-10-04: 1000 mL
  Filled 2022-10-04 (×6): qty 1000

## 2022-10-04 NOTE — Progress Notes (Signed)
NAME:  Justin Cameron, MRN:  [id number], DOB:  Aug 14, 1955, LOS: 15 ADMISSION DATE:  09/02/2022, CONSULTATION DATE:  09/09/2022 REFERRING MD:  Jarvis Newcomer, CHIEF COMPLAINT:  Abd pain   History of Present Illness:  67 year old man w/ hx of OSA, recently diagnosed large B cell lymphoma causing threatened large bowel obstruction.  Came in after chemo b/c more bloating/pain.  NGT placed, O2 needs went from RA to 15LPM so PCCM consulted.  Denies any cough.  +dyspneic with minimal exertion.  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 6/6 GOC discussed and decision made to change code status to DNR, extubated  6/7 Sleepy but alert, appears severely deconditioned   Interim History / Subjective:  No acute issues overnight Wife at bedside and updated   Objective   Blood pressure 132/64, pulse 97, temperature 99.1 F (37.3 C), resp. rate (!) 31, height 5\' 8"  (1.727 m), weight 96 kg, SpO2 94 %. CVP:  [12 mmHg] 12 mmHg  Vent Mode: PSV FiO2 (%):  [30 %-60 %] 60 % Set Rate:  [24 bmp] 24 bmp Vt Set:  [540 mL] 540 mL PEEP:  [5 cmH20] 5 cmH20 Pressure Support:  [5 cmH20] 5 cmH20 Plateau Pressure:  [21 cmH20] 21 cmH20   Intake/Output Summary (Last 24 hours) at 10/04/2022 8295 Last data filed at 10/04/2022 6213 Gross per 24 hour  Intake 1882.22 ml  Output 2535 ml  Net -652.78 ml    Filed Weights   10/02/22 0500 10/03/22 0500 10/04/22 0500  Weight: 98.4 kg 96.9 kg 96 kg    Examination:  General: Acute on chronically severely deconditioned ill appearing middle  aged male lying in bed, in NAD HEENT: [place]ite/AT, MM pink/moist, PERRL,  Neuro: Alert and oriented x3, weak  CV: s1s2 regular rate and rhythm, no murmur, rubs, or gallops,  PULM:  Shallow respiration bilaterally, no increased work of breathing, no added breath sounds  GI: soft, bowel sounds hypoactive in all 4 quadrants, midline incision, non-tender, non-distended Extremities: warm/dry, generalized non-pitting edema  Skin: no rashes or lesions  Resolved Hospital Problem list   Postoperative vent management-extubated successfully Acute hypoxic and hypercarbic respiratory failure in setting of septic shock  -extubated 6/6 to [facility]  Assessment & Plan:  Septic Shock secondary to intraabdominal RLQ abscess secondary to right colon perforation in the setting of diffuse large Bcell lymphoma mass. -5/28 Exploratory laparotomy with right hemicolectomy and end ileostomy along with JP drain placement. -6/3 CT guided JP drain placed by IR on 6/3 in RLQ abscess measuring 23 x 10 x16 cm after placement, patient developed rigors and profound SIRS response  Adrenal insufficiency -Bilateral adrenal gland tumors.  P:  Continue Ceftriaxone Local wound care, wound vac to be added 6/7 JP drain care  Surgery continues to follow, appreciate assistance  Slowly decrease stress dose steroids starting 6/7  Anemia in setting of s/p exlap and drain placement  P: Transfuse per protocol Hgb goal > 7 Monitor JP output   DVT Left Lower Extremity s/p IVC  -Not candidate at this time for anticoagulation per  oncology  -IVC filter  placed 5/29  P:  Continue supportive care  Continue SCD and Lovenox   Atrial Fib RVR -Suspecting uncontrolled due to septic shock and hypovolemia > improving P: Continuous telemetry   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  Hx of gout  P: Oncology following peripherally  Resume allopurinol when able to safely use GI track   Vomiting  -Episode of vomiting early am 6/7 P: PRN  antiemetics Hold tube feeds this am, plan to resume trickle feeds this afternoon with slow increase back to goal   Severe Protein Malnutrition  P:  Tube feeds as above   Best Practice (right click and "Reselect all SmartList Selections" daily)  Diet: TPN DVT prophylaxis: hold for now, SCDs  GI prophylaxis: PPI Code Status: full  Lines: Port, PICC, ETT, Foley, Drain x 2, arterial line.   Family Communication: Wife updated at bedside this morning.  Disposition: ICU  Critical care NA   Tasheema Perrone D. Harris, NP-C [place] Personal contact information can be found on Amion  If no contact or response made please call [phone] 10/04/2022, 7:26 AM

## 2022-10-04 NOTE — Progress Notes (Signed)
10 Days Post-Op   Subjective/Chief Complaint: Extubated, awake, alert, had some emesis with tube feeds   Objective: Vital signs in last 24 hours: Temp:  [97 F (36.1 C)-99.1 F (37.3 C)] 99.1 F (37.3 C) (06/07 0300) Pulse Rate:  [56-97] 97 (06/07 0300) Resp:  [15-34] 31 (06/07 0300) BP: (115-152)/(49-64) 132/64 (06/07 0200) SpO2:  [93 %-99 %] 94 % (06/07 0300) Arterial Line BP: (116-161)/(39-65) 160/47 (06/07 0300) FiO2 (%):  [30 %-60 %] 60 % (06/06 1326) Weight:  [96 kg] 96 kg (06/07 0500) Last BM Date : 10/01/22  Intake/Output from previous day: 06/06 0701 - 06/07 0700 In: 1882.2 [I.V.:1454.5; NG/GT:240; IV Piggyback:182.8] Out: 2535 [Urine:2000; Drains:260; Stool:275] Intake/Output this shift: No intake/output data recorded.  Ab approp tender, drains as expected with minimal output, wound with necrotic fascial edges and dehiscence with visible suture, no evisceration, ostomy functional    Lab Results:  Recent Labs    10/03/22 0614 10/04/22 0436  WBC 23.5* 18.9*  HGB 7.3* 7.1*  HCT 25.3* 22.2*  PLT 222 250   BMET Recent Labs    10/03/22 0702 10/04/22 0436  NA 133* 143  K 4.2 3.3*  CL 101 107  CO2 25 26  GLUCOSE 202* 128*  BUN 21 24*  CREATININE 0.45* 0.52*  CALCIUM 7.6* 8.0*   PT/INR No results for input(s): "LABPROT", "INR" in the last 72 hours. ABG Recent Labs    10/01/22 1118  PHART 7.35  HCO3 27.1    Studies/Results: DG Abd Portable 1V  Result Date: 10/03/2022 CLINICAL DATA:  NG tube placement. EXAM: PORTABLE ABDOMEN - 1 VIEW COMPARISON:  09/16/2022. FINDINGS: Nasogastric tube passes below the diaphragm, tip in the mid stomach. Normal bowel gas pattern. IMPRESSION: Well-positioned nasal/orogastric tube. Electronically Signed   By: Amie Portland M.D.   On: 10/03/2022 12:56    Anti-infectives: Anti-infectives (From admission, onward)    Start     Dose/Rate Route Frequency Ordered Stop   10/03/22 1400  cefTRIAXone (ROCEPHIN) 2 g in sodium  chloride 0.9 % 100 mL IVPB        2 g 200 mL/hr over 30 Minutes Intravenous Every 24 hours 10/03/22 1151     10/01/22 2000  vancomycin (VANCOREADY) IVPB 1750 mg/350 mL  Status:  Discontinued        1,750 mg 175 mL/hr over 120 Minutes Intravenous Every 24 hours 09/30/22 1748 10/03/22 0851   10/01/22 1945  meropenem (MERREM) 1 g in sodium chloride 0.9 % 100 mL IVPB  Status:  Discontinued        1 g 200 mL/hr over 30 Minutes Intravenous Every 8 hours 10/01/22 1851 10/03/22 1151   09/30/22 1845  vancomycin (VANCOREADY) IVPB 2000 mg/400 mL        2,000 mg 200 mL/hr over 120 Minutes Intravenous  Once 09/30/22 1748 09/30/22 2317   09/30/22 0800  piperacillin-tazobactam (ZOSYN) IVPB 3.375 g  Status:  Discontinued        3.375 g 12.5 mL/hr over 240 Minutes Intravenous Every 8 hours 09/30/22 0701 10/01/22 1827   09/20/22 1402  ceFAZolin (ANCEF) IVPB 2g/100 mL premix        over 30 Minutes Intravenous Continuous PRN 09/20/22 1411 09/20/22 1402   09/20/22 0600  piperacillin-tazobactam (ZOSYN) IVPB 3.375 g  Status:  Discontinued        3.375 g 12.5 mL/hr over 240 Minutes Intravenous Every 8 hours 09/09/2022 1943 09/28/22 1134   09/25/2022 1800  amoxicillin-clavulanate (AUGMENTIN) 875-125 MG per tablet 1 tablet  Status:  Discontinued        1 tablet Oral Every 12 hours 09/13/2022 1659 09/17/2022 1939       Assessment/Plan: POD 10 s/p ex lap with right colectomy and end ileostomy- TC B cell lymphoma -can do tube feeds as tolerated, I wrote for ST assessment and he can have sips/chips if cleared, continue TPN -SIRS after IR drain placement into pelvic collection.  Much better, extubated and off pressors -continue abx -continue wound care- hopefully this remains without evisceration, don't really have good options at this point and going back to OR would not be ideal and likely no better solution, I think placing vac with white sponge at 75 reasonable today   FEN - TPN, ST VTE - ok for chemical prophylaxis  from surgical standpoint, IVC filter ID - vanc/zosyn   Acute hypoxic respiratory failure - per CCM B-cell lymphoma SIRS - s/p IR drain placement LLE DVT SPCM - TNA ABL anemia    Emelia Loron 10/04/2022

## 2022-10-04 NOTE — Progress Notes (Signed)
Oncology Short Note  Met with patient and his sister in law at bedside. He has been extubated again and is speaking a little more. Still quite lethargic. No fevers no pressor. Still with significant abdominal wound. -Would recommend PRBC transfusion to maintain hgb>8 in the context of sepsis, lymphoma, challenged respiratory status, risk of bleeding and mentation/lethargy. -oncology will f/u on Monday and will be vigilant regarding appropriate timing to attempt restarting his lymphoma treatments. -Appreciate excellent care by the pulmonary critical care medicine team surgical team and nursing staff.  Wyvonnia Lora MD MS Hematology/Oncology Physician [facility]

## 2022-10-04 NOTE — Progress Notes (Signed)
Referring Physician(s): Darnelle Spangle  Supervising Physician: Mir, Secondary school teacher  Patient Status:  [facility] - In-pt  Chief Complaint:  Abdominal pain/abscess/lymphoma   Subjective: Pt extubated ; family/friends in room; still a little lethargic; FC   Allergies: Patient has no allergy information on record.  Medications: Prior to Admission medications   Medication Sig Start Date End Date Taking? Authorizing Provider  ALPRAZolam (XANAX) 0.25 MG tablet Take 0.125-0.25 mg by mouth 3 (three) times daily as needed for anxiety.   Yes [provider]  colchicine 0.6 MG tablet Take 0.6 mg by mouth daily as needed (gout). 07/19/22  Yes [provider]  diphenhydramine-acetaminophen (TYLENOL PM) 25-500 MG TABS tablet Take 1 tablet by mouth at bedtime.   Yes [provider]  diphenhydrAMINE-zinc acetate (BENADRYL EXTRA STRENGTH) cream Apply 1 Application topically 3 (three) times daily as needed for itching. 09/18/22  Yes Charlynne Pander, MD  ondansetron (ZOFRAN) 4 MG tablet TAKE 1 TABLET(4 MG) BY MOUTH EVERY 8 HOURS AS NEEDED FOR NAUSEA OR VOMITING 08/19/22  Yes Jenel Lucks, MD  simvastatin (ZOCOR) 20 MG tablet Take 20 mg by mouth every evening.   Yes [provider]  traZODone (DESYREL) 50 MG tablet Take 50-100 mg by mouth at bedtime as needed for sleep. 07/26/22  Yes [provider]  amoxicillin-clavulanate (AUGMENTIN) 875-125 MG tablet Take 1 tablet by mouth every 12 (twelve) hours. 09/18/22   Charlynne Pander, MD  dicyclomine (BENTYL) 20 MG tablet TAKE 1 TABLET(20 MG) BY MOUTH EVERY 6 HOURS Patient not taking: Reported on 09/16/2022 08/19/22   Jenel Lucks, MD     Vital Signs: BP (!) 126/57 (BP Location: Left Arm)   Pulse 88   Temp 98.1 F (36.7 C) (Bladder)   Resp (!) 25   Ht 5\' 8"  (1.727 m)   Wt 211 lb 10.3 oz (96 kg)   SpO2 97%   BMI 32.18 kg/m   Physical Exam: drowsy but arousable; rt TG drain intact, output about 85 cc  serosang fluid;  rt surgical drain with dark bloody fluid in JP   Imaging: DG Abd Portable 1V  Result Date: 10/03/2022 CLINICAL DATA:  NG tube placement. EXAM: PORTABLE ABDOMEN - 1 VIEW COMPARISON:  09/16/2022. FINDINGS: Nasogastric tube passes below the diaphragm, tip in the mid stomach. Normal bowel gas pattern. IMPRESSION: Well-positioned nasal/orogastric tube. Electronically Signed   By: Amie Portland M.D.   On: 10/03/2022 12:56   DG Chest Port 1 View  Result Date: 10/02/2022 CLINICAL DATA:  Dyspnea. EXAM: PORTABLE CHEST 1 VIEW COMPARISON:  September 30, 2022. FINDINGS: Stable cardiomediastinal silhouette. Endotracheal and nasogastric tubes are unchanged in position. Right internal jugular Port-A-Cath is unchanged. Right-sided PICC line is unchanged. Minimal bibasilar subsegmental atelectasis is noted. Bony thorax is unremarkable. IMPRESSION: Stable support apparatus. Minimal bibasilar subsegmental atelectasis. Electronically Signed   By: Lupita Raider M.D.   On: 10/02/2022 11:22   DG CHEST PORT 1 VIEW  Result Date: 09/30/2022 CLINICAL DATA:  OG tube and endotracheal tube placement. EXAM: PORTABLE CHEST 1 VIEW COMPARISON:  08/28/2022 FINDINGS: An endotracheal tube with tip 2.5 cm above the carina, RIGHT PICC line with tip difficult to visualize but appears to overlie the RIGHT atrium and a RIGHT Port-A-Cath with tip overlying the RIGHT atrium as well. An enteric tube is noted with tip overlying the mid stomach. Bibasilar opacities/atelectasis noted. There is no evidence of pneumothorax or large pleural effusion. IMPRESSION: 1. RIGHT PICC line placement but otherwise unchanged appearance  of the chest. Other support apparatus as described with mild bibasilar opacities/atelectasis. Electronically Signed   By: Harmon Pier M.D.   On: 09/30/2022 17:26   CT GUIDED PERITONEAL/RETROPERITONEAL FLUID DRAIN BY PERC CATH  Result Date: 09/30/2022 INDICATION: [id number] Postprocedural intraabdominal abscess [id number] EXAM:  CT-guided pelvic drain placement TECHNIQUE: Multidetector CT imaging of the abdomen and pelvis was performed following the standard protocol without IV contrast. RADIATION DOSE REDUCTION: This exam was performed according to the departmental dose-optimization program which includes automated exposure control, adjustment of the mA and/or kV according to patient size and/or use of iterative reconstruction technique. MEDICATIONS: The patient is currently admitted to the hospital and receiving intravenous antibiotics. The antibiotics were administered within an appropriate time frame prior to the initiation of the procedure. ANESTHESIA/SEDATION: Moderate (conscious) sedation was employed during this procedure. A total of Versed 2 mg and Fentanyl 100 mcg was administered intravenously by the radiology nurse. Total intra-service moderate Sedation Time: 19 minutes. The patient's level of consciousness and vital signs were monitored continuously by radiology nursing throughout the procedure under my direct supervision. COMPLICATIONS: None immediate. PROCEDURE: Informed written consent was obtained from the patient after a thorough discussion of the procedural risks, benefits and alternatives. All questions were addressed. Maximal Sterile Barrier Technique was utilized including caps, mask, sterile gowns, sterile gloves, sterile drape, hand hygiene and skin antiseptic. A timeout was performed prior to the initiation of the procedure. The patient was placed supine on the exam table. Limited CT of the abdomen and pelvis was performed for planning purposes. This demonstrated significant improvement of gas and fluid collection in the right lower quadrant. The surgically placed drainage catheter courses through this collection. Also noted was unchanged appearance a sizable posterior pelvic collection. Given these findings, the decision was made to proceed with placement of a transgluteal pelvic drainage catheter. The patient was  repositioned in the lateral decubitus position. Skin entry site was marked, and the overlying skin was prepped and draped in the standard sterile fashion. Local analgesia was obtained with 1% lidocaine. Using intermittent CT fluoroscopy, a 19 gauge Yueh catheter was advanced towards the identified posterior pelvic fluid collection using a right transgluteal approach. Location was confirmed with CT and return of purulent material. Over an Amplatz wire, the percutaneous tract was serially dilated to accommodate a 12 French locking drainage catheter. Location was again confirmed with CT and return of additional fluid. Locking loop was formed, the drainage catheter was secured to the skin using silk suture and a dressing. It was attached to bulb suction. The patient tolerated the procedure well without immediate complication. IMPRESSION: 1. CT imaging demonstrated significant improvement in the right lower quadrant collection with surgical drainage catheter in place. Given these findings, the decision was made to proceed with drainage of the unchanged posterior pelvic collection. 2. Successful CT-guided placement of a 12 French locking drainage catheter into the posterior pelvic collection via a right transgluteal approach. Drainage catheter placed to bulb suction. Electronically Signed   By: Olive Bass M.D.   On: 09/30/2022 16:21    Labs:  CBC: Recent Labs    10/01/22 0530 10/02/22 0552 10/02/22 1830 10/03/22 0614 10/04/22 0436  WBC 31.0* 19.7*  --  23.5* 18.9*  HGB 8.4* 7.2* 7.7* 7.3* 7.1*  HCT 27.6* 23.5* 24.7* 25.3* 22.2*  PLT 212 196  --  222 250    COAGS: Recent Labs    09/20/2022 1713 09/30/22 1825  INR 1.1 1.2  APTT 29 27  BMP: Recent Labs    10/01/22 0414 10/01/22 0530 10/02/22 0956 10/03/22 0702 10/04/22 0436  NA SPECIMEN CONTAMINATED, UNABLE TO PERFORM TEST(S). 138 136 133* 143  K SPECIMEN CONTAMINATED, UNABLE TO PERFORM TEST(S). 3.6 3.7 4.2 3.3*  CL SPECIMEN  CONTAMINATED, UNABLE TO PERFORM TEST(S). 105 102 101 107  CO2 SPECIMEN CONTAMINATED, UNABLE TO PERFORM TEST(S). 23 25 25 26   GLUCOSE SPECIMEN CONTAMINATED, UNABLE TO PERFORM TEST(S). 240* 140* 202* 128*  BUN SPECIMEN CONTAMINATED, UNABLE TO PERFORM TEST(S). 26* 23 21 24*  CALCIUM SPECIMEN CONTAMINATED, UNABLE TO PERFORM TEST(S). 6.7* 7.3* 7.6* 8.0*  CREATININE SPECIMEN CONTAMINATED, UNABLE TO PERFORM TEST(S). 0.78 0.65 0.45* 0.52*  GFRNONAA SPECIMEN CONTAMINATED, UNABLE TO PERFORM TEST(S). >60 >60 >60 >60  GFRAA SPECIMEN CONTAMINATED, UNABLE TO PERFORM TEST(S).  --   --   --   --     LIVER FUNCTION TESTS: Recent Labs    10/01/22 0414 10/01/22 0530 10/02/22 0956 10/03/22 0702  BILITOT SPECIMEN CONTAMINATED, UNABLE TO PERFORM TEST(S). 0.9 1.2 0.8  AST SPECIMEN CONTAMINATED, UNABLE TO PERFORM TEST(S). 27 21 18   ALT SPECIMEN CONTAMINATED, UNABLE TO PERFORM TEST(S). 13 11 13   ALKPHOS SPECIMEN CONTAMINATED, UNABLE TO PERFORM TEST(S). 113 90 122  PROT SPECIMEN CONTAMINATED, UNABLE TO PERFORM TEST(S). 4.2* 4.9* 5.2*  ALBUMIN SPECIMEN CONTAMINATED, UNABLE TO PERFORM TEST(S). 1.6* 2.1* 2.1*    Assessment and Plan: 67 y.o. male with PMH HLD, arthritis, OSA and newly diagnosed DLBCL who was admitted to [facility] from [facility] on 5/23 with persistent abd pain, fatigue, nausea, neck pain, lightheadedness, night sweats, weight loss, poor oral intake. He is s/p expl lap with rt hemicolectomy and end ileostomy on October 11, 2022 for perforation of rt colon into RP due to extensive B cell lymphoma involvement . Also with hx BLE DVT. He is s/p port a cath placement on 5/24 and IVC filter placement on 09/25/22 ; s/p right TG approach drain placement (12 fr to JP) 6/3 into post pelvic fluid collection ; afebrile, WBC 18.9(23.5), hgb 7.1(7.3), creat 0.52, drain fl cx- e coli; cont current tx; consider f/u CT once drain outputs are minimal over 3-4 day span or if clinically worsens ; other plans as per  CCS/CCM/oncology   Electronically Signed: D. Jeananne Rama, PA-C 10/04/2022, 4:05 PM   I spent a total of 15 Minutes at the the patient's bedside AND on the patient's hospital floor or unit, greater than 50% of which was counseling/coordinating care for pelvic abscess drain     Patient ID: [name], male   DOB: 06/11/1955, 67 y.o.   MRN: [id number]

## 2022-10-04 NOTE — Evaluation (Signed)
SLP Cancellation Note  Patient Details Name: Justin Cameron MRN: [id number] DOB: Jan 23, 1956   Cancelled treatment:       Reason Eval/Treat Not Completed: Other (comment);SLP screened, no needs identified, will sign off (pt vomited this am per RN, will continue efforts) Justin Infante, MS [facility] SLP Acute Rehab Services Office [phone]  Chales Abrahams 10/04/2022, 9:56 AM

## 2022-10-04 NOTE — Progress Notes (Signed)
PT Cancellation Note  Patient Details Name: Justin Cameron MRN: [id number] DOB: June 18, 1955   Cancelled Treatment:     PT deferred this date at request of pt and family.  Pre-arranged visit with elderly mother pending.  Will follow.   Tujuana Kilmartin 10/04/2022, 1:35 PM

## 2022-10-04 NOTE — Progress Notes (Addendum)
Nutrition Follow-up  DOCUMENTATION CODES:   Severe malnutrition in context of chronic illness  INTERVENTION:  - Restarting tube feeds at 51mL/hr today at 1600 per discussion with Dr. Thora Lance. Planning for Q12H advancement towards goal as below:  Vital 1.5 at 60 ml/h (1440 ml per day) Prosource TF20 60 ml BID *Starting at 5mL/hr and advancing by 10mL Q12H Provides 2320 kcal, 137 gm protein, 1100 ml free water daily  - Monitor magnesium, potassium, and phosphorus BID for at least 3 days, MD to replete as needed.  - FWF per CCM/MD.    - Plan to continue TPN at goal of 8mL/hr per pharmacy, provides: 131g protein, 342g dextrose, 540 IL kcals = 2228 kcals - TPN management per pharmacy.   - Likely plan to start weaning TPN once tolerating TF.   - Monitor weight trends. Daily weights while on TPN.     NUTRITION DIAGNOSIS:   Severe Malnutrition related to chronic illness (non-Hodgkin's lymphoma) as evidenced by mild fat depletion, mild muscle depletion, energy intake < or equal to 75% for > or equal to 1 month, percent weight loss (11% in less than 1 month). *ongoing  GOAL:   Patient will meet greater than or equal to 90% of their needs *met with TPN  MONITOR:   Diet advancement, Labs, Weight trends, I & O's  REASON FOR ASSESSMENT:   Consult Enteral/tube feeding initiation and management  ASSESSMENT:   67 y.o. male admits related to abdominal pain and poor oral intakes. PMH includes arthritis and high cholesterol. 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  23 Admit 5/28 ex-lap, right hemicolectomy and end ileostomy; TPN started 6/3 Intubated 6/6 Extubated; trickle TF started  Patient laying in bed at time of visit, did not interact much with provider. Wife at bedside.   TPN continues to infuse at goal rate of 5mL/hr. Remains concentrated to reduce fluid burden.   Trickle tube feeds were started yesterday but patient  vomited early this AM and TF stopped.   Per discussion with CCM Dr. Thora Lance, plan to restart this afternoon. Plan for Vital 1.5 at 61mL/hr with Q12H advancements. Discussed with RN.  Plan to wean TPN as TF is advanced and tolerated.    Medications reviewed and include: Colace, Miralax  Labs reviewed:  K+ 3.3 Triglycerides 201 (6/4)   Diet Order:   Diet Order             Diet NPO time specified  Diet effective now                   EDUCATION NEEDS:  Education needs have been addressed  Skin:  Skin Assessment: Skin Integrity Issues: Skin Integrity Issues:: Incisions Incisions: Abdomen  Last BM:  6/4 - ileostomy  Height:  Ht Readings from Last 1 Encounters:  10/02/22 5\' 8"  (1.727 m)   Weight:  Wt Readings from Last 1 Encounters:  10/04/22 96 kg   Ideal Body Weight:  70 kg  BMI:  Body mass index is 32.18 kg/m.  Estimated Nutritional Needs:  Kcal:  2250-2450 kcals Protein:  120-140 grams Fluid:  >/= 2.2L    Shelle Iron RD, LDN For contact information, refer to [facility].

## 2022-10-04 NOTE — Consult Note (Addendum)
WOC Nurse ostomy follow-up consult note Pouch changed with assistance from Pt's wife.  Pt is critically ill and did not watch the procedure or participate.  Wife was able to stretch the barrier ring and apply to the back of the wafer, and snap together the 2 piece pouching system.  She was able to open and close the velcro to empty.  Stoma is red and viable, above skin level, 1 1/2 inches, cut to offset. 40cc dark brown liquid stool. Red rash to right of barrier and left lower abd appears to be moisture associated skin damage from previous sweating and a device that was in place to the abd. Ostomy powder applied to treat the affected area and orders placed for nurses to use PRN.  3 extra sets of supplies in the drawer in the room, along with educational materials. Use Supplies: 2 pc 2 3/4" pouch/2" skin barrier rings [place] # 2/[place] # 649/[place] [phone]) Enrolled patient in [facility] DC program: Yes, previously  WOC Nurse wound follow up Surgical team following for assessment and plan of care.  Requested to apply Vac dressing to midline post-op full thickness wound.  85% red, 15% yellow, sutures visible to interior wound, mod amt yellow drainage.  17X7X5.5cm. Pt was medicated for pain prior to the procedure and tolerated with mod amt discomfort.  Applied Mepitel, I piece white foam, I piece black foam to 75mm cont suction.  WOC will plan to change again on Mon.  Thank-you,  Cammie Mcgee MSN, RN, CWOCN, [place], CNS [phone]

## 2022-10-04 NOTE — Progress Notes (Signed)
PHARMACY - TOTAL PARENTERAL NUTRITION CONSULT NOTE   Indication: Prolonged ileus  Patient Measurements: Height: 5\' 8"  (172.7 cm) Weight: 96 kg (211 lb 10.3 oz) IBW/kg (Calculated) : 68.4 TPN AdjBW (KG): 81 Body mass index is 32.18 kg/m.  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  He was supposed to start chemo, but developed severe acute abdominal pain on 5/28.  NG tube placed, imaging to confirm placement shows possible free air and he was taken to OR on 5/28. Pharmacy consulted to dose TPN for prolonged ileus.    CT with increased fluid collection in right abdomen around surgical drain. IR placed drain 6/3. Patient later intubated and started on vasopressors. Patient with extravascular volume, given albumin 6/4. TPN concentrated 6/4 in attempt to reduce fluid burden. Patient remains intubated but with reduced vasopressor requirements on 6/5. Fever curve and WBC improved. Patient extubated 6/6 and off of vasopressors. Tube feeds started at trickle 6/6, but patient with episode of vomiting 6/7 am and tube feeds subsequently held.  Glucose / Insulin: No hx DM, no PTA medications.   - Hydrocortisone 100 mg BID started on 6/5 - CBGs improved, mostly within goal < 150 - sensitive SSI: 6 units / 24 hrs Electrolytes: K 3.3, corrected Ca 9.5 Renal: SCr low, BUN WNL Hepatic: AST/ALT, Tbili WNL. TG 201 Intake / Output: - Drains 310 mL - NG/emesis 150 mL - Urine 1775 mL - ostomy: 275 mL GI Imaging:  -6/2 CT abd/pelvis - large irregular fluid collection in the right abdomen lies in the location of the previous seen large necrotic mass -5/28  Lucency in the right upper quadrant was not seen on the previous images. Free intraperitoneal air is not excluded.  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  -  6/3 IR drainage catheter placed into posterior pelvic fluid collection  Central access: Port (09/20/22), Triple lumen PICC placed 5/29 TPN start date: 5/29  Nutritional Goals: Goal TPN rate of 75 mL/hr provides 131 g of protein and 2228 kcals per day (Note rate reduction in attempt to reduce fluid burden)  RD Assessment: updated 6/4 following intubation Estimated Needs Total Energy Estimated Needs: 2250-2450 kcals Total Protein Estimated Needs: 120-140 grams Total Fluid Estimated Needs: >/= 2.2L  Current Nutrition: tube feeds started 6/6  Plan:  -Patient started on tube feeds 6/6 - episode of vomiting 6/7 am, tube feeds held with plan to potentially restart later today and advance slowly -Maintain concentrated TPN for now  -Potassium 10 mEq IV x 6 -Continue TPN at 75 mL/hr -Once tolerating tube feeds and able to advance, will plan to wean off TPN -Electrolytes in TPN: Na 100 mEq/L, K 70 mEq/L, Ca 3 mEq/L, Mg 8 mEq/L, and Phos 20 mmol/L. Cl:Ac 1:1 -Add standard MVI and trace elements to TPN -Continue sensitive SSI q4h and adjust as needed  -Monitor TPN labs on Mon/Thurs and PRN   Pricilla Riffle, PharmD, BCPS Clinical Pharmacist 10/04/2022 8:36 AM

## 2022-10-05 DIAGNOSIS — C8330 Diffuse large B-cell lymphoma, unspecified site: Secondary | ICD-10-CM | POA: Diagnosis not present

## 2022-10-05 LAB — BPAM RBC

## 2022-10-05 LAB — BASIC METABOLIC PANEL
Anion gap: 9 (ref 5–15)
BUN: 26 mg/dL — ABNORMAL HIGH (ref 8–23)
CO2: 27 mmol/L (ref 22–32)
Calcium: 8.2 mg/dL — ABNORMAL LOW (ref 8.9–10.3)
Chloride: 111 mmol/L (ref 98–111)
Creatinine, Ser: 0.6 mg/dL — ABNORMAL LOW (ref 0.61–1.24)
GFR, Estimated: >60 mL/min (ref >60)
Glucose, Bld: 134 mg/dL — ABNORMAL HIGH (ref 70–99)
Potassium: 3.5 mmol/L (ref 3.5–5.1)
Sodium: 147 mmol/L — ABNORMAL HIGH (ref 135–145)

## 2022-10-05 LAB — CBC WITH DIFFERENTIAL/PLATELET
Abs Immature Granulocytes: 1.07 10*3/uL — ABNORMAL HIGH (ref 0.00–0.07)
Basophils Absolute: 0.1 10*3/uL (ref 0.0–0.1)
Basophils Relative: 1 %
Eosinophils Absolute: 0 10*3/uL (ref 0.0–0.5)
Eosinophils Relative: 0 %
HCT: 21.8 % — ABNORMAL LOW (ref 39.0–52.0)
Hemoglobin: 6.8 g/dL — CL (ref 13.0–17.0)
Immature Granulocytes: 9 %
Lymphocytes Relative: 5 %
Lymphs Abs: 0.6 10*3/uL — ABNORMAL LOW (ref 0.7–4.0)
MCH: 25.7 pg — ABNORMAL LOW (ref 26.0–34.0)
MCHC: 31.2 g/dL (ref 30.0–36.0)
MCV: 82.3 fL (ref 80.0–100.0)
Monocytes Absolute: 0.9 10*3/uL (ref 0.1–1.0)
Monocytes Relative: 7 %
Neutro Abs: 9.6 10*3/uL — ABNORMAL HIGH (ref 1.7–7.7)
Neutrophils Relative %: 78 %
Platelets: 284 10*3/uL (ref 150–400)
RBC: 2.65 MIL/uL — ABNORMAL LOW (ref 4.22–5.81)
RDW: 19 % — ABNORMAL HIGH (ref 11.5–15.5)
WBC: 12.1 10*3/uL — ABNORMAL HIGH (ref 4.0–10.5)
nRBC: 0.3 % — ABNORMAL HIGH (ref 0.0–0.2)

## 2022-10-05 LAB — HEMOGLOBIN AND HEMATOCRIT, BLOOD
HCT: 28.4 % — ABNORMAL LOW (ref 39.0–52.0)
Hemoglobin: 8.8 g/dL — ABNORMAL LOW (ref 13.0–17.0)

## 2022-10-05 LAB — GLUCOSE, CAPILLARY
Glucose-Capillary: 117 mg/dL — ABNORMAL HIGH (ref 70–99)
Glucose-Capillary: 108 mg/dL — ABNORMAL HIGH (ref 70–99)
Glucose-Capillary: 103 mg/dL — ABNORMAL HIGH (ref 70–99)
Glucose-Capillary: 132 mg/dL — ABNORMAL HIGH (ref 70–99)
Glucose-Capillary: 107 mg/dL — ABNORMAL HIGH (ref 70–99)
Glucose-Capillary: 107 mg/dL — ABNORMAL HIGH (ref 70–99)

## 2022-10-05 LAB — TYPE AND SCREEN: Unit division: 0

## 2022-10-05 LAB — PHOSPHORUS: Phosphorus: 5.3 mg/dL — ABNORMAL HIGH (ref 2.5–4.6)

## 2022-10-05 LAB — PREPARE RBC (CROSSMATCH)

## 2022-10-05 LAB — MAGNESIUM: Magnesium: 2.2 mg/dL (ref 1.7–2.4)

## 2022-10-05 MED ORDER — TRACE MINERALS CU-MN-SE-ZN 300-55-60-3000 MCG/ML IV SOLN
INTRAVENOUS | Status: AC
  Filled 2022-10-05: qty 876

## 2022-10-05 MED ORDER — STERILE WATER FOR INJECTION IJ SOLN
INTRAMUSCULAR | Status: AC
  Filled 2022-10-05: qty 10

## 2022-10-05 MED ORDER — ONDANSETRON HCL 4 MG/2ML IJ SOLN
4.0000 mg | Freq: Four times a day (QID) | INTRAMUSCULAR | Status: DC | PRN
  Administered 2022-10-05 – 2022-10-06 (×4): 4 mg via INTRAVENOUS
  Filled 2022-10-05 (×4): qty 2

## 2022-10-05 MED ORDER — SODIUM CHLORIDE 0.9% IV SOLUTION: Freq: Once | INTRAVENOUS | Status: AC

## 2022-10-05 NOTE — Evaluation (Signed)
SLP Cancellation Note  Patient Details Name: Justin Cameron MRN: [id number] DOB: December 04, 1955   Cancelled treatment:       Reason Eval/Treat Not Completed: Medical issues which prohibited therapy (RN reports pt not tolerating ice chips/sips; will continue efforts) Rolena Infante, MS [facility] SLP Acute Rehab Services Office [phone]   Chales Abrahams 10/05/2022, 2:38 PM

## 2022-10-05 NOTE — Progress Notes (Signed)
eLink Physician-Brief Progress Note Patient Name: DONDI AIME DOB: [birth year] MRN: [id number]   Date of Service  10/05/2022  HPI/Events of Note  67 year old male who is postop day 11 from right-sided colectomy and end ileostomy in the setting of B-cell lymphoma.  Previously had difficulty with tube feeds on 6/6 where he developed nausea and emesis.  Tube feeds were restarted at a lower rate and anticipated slow advance.  Unfortunately, the patient developed significant nausea again at a rate of 20.  Compazine administered around midnight.  Patient is having stool output through the ileostomy and having small amounts of mucus and solid output from rectum.  No bleeding or pain.  eICU Interventions  Hold tube feeds again until nausea resolves.  Do not aspirate unless he develops emesis at which point we can do low intermittent suction.     Intervention Category Minor Interventions: Routine modifications to care plan (e.g. PRN medications for pain, fever)  Palmer Fahrner 10/05/2022, 2:48 AM

## 2022-10-05 NOTE — Progress Notes (Signed)
PROGRESS NOTE    Justin Cameron  ZOX:[id number] DOB: 02/07/1956 DOA: 09/05/2022 PCP: Daisy Floro, MD    Brief Narrative:   Justin Cameron is a 67 y.o. male with past medical history significant for HLD, OA, anxiety/depression, OSA, recent diagnosis of diffuse large B-cell lymphoma who presented to [facility] as a direct admission by his oncology clinic with progressive abdominal pain, generalized fatigue, poor oral intake and unintentional weight loss.  Patient followed by [facility] with colonoscopy on 08/21/2022 that was indeterminant with repeat colonoscopy 09/05/2022 that showed an aggressive large B-cell lymphoma.  Patient was seen by general surgery, Dr. Maisie Fus who recommended medical oncology and no role for surgery.   Day prior patient was seen in the ED and CT abdomen/pelvis on admission notable for enlarging mass hepatic flexure, increased pericolonic fat stranding with loss of fat plane between the mass in the right lobe of the liver, increasing mesenteric lymphadenopathy, new soft tissue mass along the crus of the left abdomen enlarging bilateral adrenal masses consistent with progressive intra-abdominal metastases, new and enlarging left lower lobe pulmonary nodules consistent with progressive metastatic pulmonary involvement.  Patient was given IV fluid hydration and discharged for follow-up with medical oncology.  Plan to follow-up with medical oncology clinic day of admission he was noted be in moderate distress due to abdominal pain and bloating and was accepted for direct admission by the hospitalist service.  Significant Hospital events: 5/23: Direct admit from home per medical oncology to [facility] 5/23: IR consulted and Port-A-Cath placed 5/24: Started rituximab per oncology 5/28: General surgery consulted for large bowel obstruction in setting of stage IV DLBCL; exploratory laparotomy with right hemicolectomy and end ileostomy by Dr. Luisa Hart; transfer to ICU  under PCCM service as remained on ventilatory support following surgery; started on TPN 5/29: IR consulted for IVC filter placement 5/30: Transfused 1 unit PRBC 5/31: Transferred back to hospitalist service 6/3: IR consulted for CT drain placement due to repeat CT findings of large collection in right abdomen and surgical bed; following drain placement patient became tachycardic, hypotensive febrile, PCCM reconsulted and patient was intubated and transferred back to ICU with septic shock with broadening of antibiotics 6/6: GOC discussed, CODE STATUS changed to DNR, extubated 6/7: Weaning stress dose steroids 6/8: Transferred back to [facility]   Assessment & Plan:   Septic shock secondary to intra-abdominal right lower quadrant abscess secondary to right colon perforation in the setting of diffuse large B-cell lymphoma E. coli intra-abdominal abscess Patient presenting as a direct admission per oncology request with diffuse abdominal pain with findings of progressive mass hepatic flexure, increased mesenteric lymphadenopathy, adrenal masses progressive intra-abdominal metastasis and new pulmonary nodules consistent with metastatic process.  Oncology was initially consulted for initiation of chemotherapy given his lymphoma and patient was started on rituximab on 5/24.  Patient's condition continued decline and general surgery was consulted on 5/28 and underwent exploratory laparotomy with right hemicolectomy/end ileostomy by Dr. Luisa Hart.  Hospitalization was further complicated septic shock due to large intra-abdominal abscess postoperatively and IR drain was placed and patient was placed on broadened antibiotics.  Culture from drain placement with moderate to E. coli. -- General surgery, medical oncology following; appreciate assistance -- WBC 10.6>>>42.6>>>10.4>>>33.5>>19.7>23.5>18.9>12.1 -- Ceftriaxone 2 g IV every 24 hours -- Continue JP drain, continue to monitor output; management per general  surgery/IR -- Continue local wound care, has wound VAC to abdominal surgical incision that was started on 6/7 -- Not a candidate for restarting chemotherapy until  wound heals per medical oncology -- CBC daily  Adrenal insufficiency -- Continue to slowly wean hydrocortisone, currently on 50 mg IV every 12  Acute hypoxic/hypercarbic respiratory failure in the setting of septic shock Following IR drain placement, patient became febrile, tachycardic, tachypneic and was subsequently intubated in the setting of septic shock from intra-abdominal abscess complicated by his progressive diffuse large B-cell lymphoma and large bowel obstruction requiring hemicolectomy and end ileostomy.  Patient was intubated on 6/3 and successfully extubated on 6/6. -- Continue supplemental oxygen, maintain SpO2 greater than 92%, currently on 6 L nasal cannula  At risk for tumor lysis syndrome History of gout Medical oncology following, plan to resume allopurinol when able to safely use GI tract, currently not tolerating tube feeds  DVT left lower extremity s/p IVC Not a candidate for anticoagulation per oncology.  IR was consulted and IVC filter placed 5/29  Atrial fibrillation with RVR Etiology likely secondary to septic shock, hypovolemia.  Initially treated with IV metoprolol followed by amiodarone. --Monitor on telemetry  Anemia in the setting of critical illness -- Hgb 11.8>>>6.9>>>9.2>>7.7>7.3>7.1>6.8 -- transfused 2 unit PRBC 5/30 -- Transfused 2 unit PRBC 6/8  Severe protein calorie malnutrition Nutrition Status: Nutrition Problem: Severe Malnutrition Etiology: chronic illness (non-Hodgkin's lymphoma) Signs/Symptoms: mild fat depletion, mild muscle depletion, energy intake < or equal to 75% for > or equal to 1 month, percent weight loss (11% in less than 1 month) Percent weight loss: 11 % (in less than 1 month) Interventions: Refer to RD note for recommendations, TPN -- Dietitian following, on TPN,  currently intolerant of tube feeds, continue to challenge daily -- Continue TPN, pharmacy consulted for management  Significant weakness/debility/deconditioning: -- Continue to encourage aggressive therapy PT/OT   DVT prophylaxis: enoxaparin (LOVENOX) injection 40 mg Start: 10/01/22 1100    Code Status: DNR Family Communication: No family present at bedside this morning  Disposition Plan:  Level of care: ICU Status is: Inpatient Remains inpatient appropriate because: IV antibiotics, on TPN, needs further advancement of tube feeds with toleration before able to consider discharge, pending general surgery/oncology sign off, overall prognosis extremely poor in the setting of open abdominal wound in the setting of diffuse large B-cell lymphoma currently not on chemotherapy    Consultants:  General surgery Medical oncology Interventional radiology  Procedures:  Port-A-Cath placement 10-14-2022 Right hemicolectomy with end ileostomy, Dr. Luisa Hart 5/28 IVC filter placement by IR 5/29 IR drain placement 6/3 Intubation 6/3 Extubation 6/6 Wound VAC placement 6/7  Antimicrobials:  Zosyn 5/22 - 5/31 Cefazolin 5/24 - 5/24 Vancomycin 6/3 - 6/5 Meropenem 6/4 - 6/5 Ceftriaxone 6/6>>   Subjective: Patient seen examined bedside, lying in bed.  Ill in appearance.  More alert today after discussion with nursing and general surgery Dr. Dwain Sarna today.  Hemoglobin slightly down to 6.8 today, will transfuse.  Overall very guarded prognosis given his diffuse large B-cell lymphoma currently not on chemotherapy with concern for open abdominal wound with potential of evisceration per general surgery.  Objective: Vitals:   10/05/22 0600 10/05/22 0700 10/05/22 0800 10/05/22 0812  BP: 112/69 (!) 130/57 (!) 126/59   Pulse: 89 88 86   Resp: (!) 25 (!) 22 (!) 24   Temp:   98.3 F (36.8 C) 98.3 F (36.8 C)  TempSrc:   Oral Oral  SpO2: 98% 96% 98%   Weight:      Height:        Intake/Output Summary  (Last 24 hours) at 10/05/2022 1013 Last data filed at 10/05/2022  0800 Gross per 24 hour  Intake 1692.87 ml  Output 3320 ml  Net -1627.13 ml   Filed Weights   10/03/22 0500 10/04/22 0500 10/05/22 0236  Weight: 96.9 kg 96 kg 95 kg    Examination:  Physical Exam: GEN: NAD, alert and oriented x 3, ill in appearance, appears older than stated age HEENT: NCAT, PERRL, EOMI, sclera clear, dry mucous membranes PULM: CTAB w/o wheezes/crackles, normal respiratory effort CV: RRR w/o M/G/R GI: abd soft, mild distention, mild TTP abd, faint BS MSK: no peripheral edema, moves all extremities independently NEURO: No focal neurological deficits appreciated PSYCH: Depressed mood, flat affect     Data Reviewed: I have personally reviewed following labs and imaging studies  CBC: Recent Labs  Lab 10/01/22 0530 10/02/22 0552 10/02/22 1830 10/03/22 0614 10/04/22 0436 10/05/22 0554  WBC 31.0* 19.7*  --  23.5* 18.9* 12.1*  NEUTROABS 27.9* 16.5*  --  21.7* 16.8* 9.6*  HGB 8.4* 7.2* 7.7* 7.3* 7.1* 6.8*  HCT 27.6* 23.5* 24.7* 25.3* 22.2* 21.8*  MCV 82.9 82.7  --  94.1 80.7 82.3  PLT 212 196  --  222 250 284   Basic Metabolic Panel: Recent Labs  Lab 10/01/22 0414 10/01/22 0530 10/02/22 0956 10/03/22 0702 10/04/22 0436 10/04/22 1710 10/05/22 0554  NA SPECIMEN CONTAMINATED, UNABLE TO PERFORM TEST(S). 138 136 133* 143  --  147*  K SPECIMEN CONTAMINATED, UNABLE TO PERFORM TEST(S). 3.6 3.7 4.2 3.3*  --  3.5  CL SPECIMEN CONTAMINATED, UNABLE TO PERFORM TEST(S). 105 102 101 107  --  111  CO2 SPECIMEN CONTAMINATED, UNABLE TO PERFORM TEST(S). 23 25 25 26   --  27  GLUCOSE SPECIMEN CONTAMINATED, UNABLE TO PERFORM TEST(S). 240* 140* 202* 128*  --  134*  BUN SPECIMEN CONTAMINATED, UNABLE TO PERFORM TEST(S). 26* 23 21 24*  --  26*  CREATININE SPECIMEN CONTAMINATED, UNABLE TO PERFORM TEST(S). 0.78 0.65 0.45* 0.52*  --  0.60*  CALCIUM SPECIMEN CONTAMINATED, UNABLE TO PERFORM TEST(S). 6.7* 7.3* 7.6* 8.0*   --  8.2*  MG SPECIMEN CONTAMINATED, UNABLE TO PERFORM TEST(S). 1.7  --  1.9 2.0 2.2  --   PHOS SPECIMEN CONTAMINATED, UNABLE TO PERFORM TEST(S). 4.2  --  2.8 2.6 3.5  --    GFR: Estimated Creatinine Clearance: 101.5 mL/min (A) (by C-G formula based on SCr of 0.6 mg/dL (L)). Liver Function Tests: Recent Labs  Lab 09/30/22 1825 10/01/22 0414 10/01/22 0530 10/02/22 0956 10/03/22 0702  AST 36 SPECIMEN CONTAMINATED, UNABLE TO PERFORM TEST(S). 27 21 18   ALT 15 SPECIMEN CONTAMINATED, UNABLE TO PERFORM TEST(S). 13 11 13   ALKPHOS 141* SPECIMEN CONTAMINATED, UNABLE TO PERFORM TEST(S). 113 90 122  BILITOT 1.5* SPECIMEN CONTAMINATED, UNABLE TO PERFORM TEST(S). 0.9 1.2 0.8  PROT 4.6* SPECIMEN CONTAMINATED, UNABLE TO PERFORM TEST(S). 4.2* 4.9* 5.2*  ALBUMIN 1.8* SPECIMEN CONTAMINATED, UNABLE TO PERFORM TEST(S). 1.6* 2.1* 2.1*   No results for input(s): "LIPASE", "AMYLASE" in the last 168 hours. No results for input(s): "AMMONIA" in the last 168 hours. Coagulation Profile: Recent Labs  Lab 09/30/22 1825  INR 1.2   Cardiac Enzymes: Recent Labs  Lab 09/30/22 2046  CKTOTAL 26*   BNP (last 3 results) No results for input(s): "PROBNP" in the last 8760 hours. HbA1C: No results for input(s): "HGBA1C" in the last 72 hours. CBG: Recent Labs  Lab 10/04/22 1651 10/04/22 1944 10/04/22 2304 10/05/22 0307 10/05/22 0810  GLUCAP 109* 111* 119* 117* 108*   Lipid Profile: No results for input(s): "CHOL", "HDL", "LDLCALC", "  TRIG", "CHOLHDL", "LDLDIRECT" in the last 72 hours. Thyroid Function Tests: No results for input(s): "TSH", "T4TOTAL", "FREET4", "T3FREE", "THYROIDAB" in the last 72 hours. Anemia Panel: No results for input(s): "VITAMINB12", "FOLATE", "FERRITIN", "TIBC", "IRON", "RETICCTPCT" in the last 72 hours. Sepsis Labs: Recent Labs  Lab 09/30/22 1825 09/30/22 2046 10/01/22 0420  LATICACIDVEN 3.3* 3.5* 2.3*    Recent Results (from the past 240 hour(s))  Body fluid culture w  Gram Stain     Status: None   Collection Time: 10/01/22  5:40 PM   Specimen: JP Drain; Body Fluid  Result Value Ref Range Status   Specimen Description   Final    JP DRAINAGE RIGHT PELVIC Performed at [facility], [address] [place], [place] [address]    Special Requests   Final    Normal Performed at [facility], [address], [place], [place] [address]    Gram Stain   Final    MODERATE WBC PRESENT, PREDOMINANTLY PMN FEW GRAM NEGATIVE RODS RARE GRAM POSITIVE COCCI RARE GRAM POSITIVE RODS Performed at [facility], [address] [place], [place] [address]    Culture MODERATE ESCHERICHIA COLI  Final   Report Status 10/03/2022 FINAL  Final   Organism ID, Bacteria ESCHERICHIA COLI  Final      Susceptibility   Escherichia coli - MIC*    AMPICILLIN >=32 RESISTANT Resistant     CEFEPIME <=0.12 SENSITIVE Sensitive     CEFTAZIDIME <=1 SENSITIVE Sensitive     CEFTRIAXONE 0.5 SENSITIVE Sensitive     CIPROFLOXACIN <=0.25 SENSITIVE Sensitive     GENTAMICIN <=1 SENSITIVE Sensitive     IMIPENEM <=0.25 SENSITIVE Sensitive     TRIMETH/SULFA <=20 SENSITIVE Sensitive     AMPICILLIN/SULBACTAM >=32 RESISTANT Resistant     PIP/TAZO 8 SENSITIVE Sensitive     * MODERATE ESCHERICHIA COLI         Radiology Studies: DG Abd Portable 1V  Result Date: 10/03/2022 CLINICAL DATA:  NG tube placement. EXAM: PORTABLE ABDOMEN - 1 VIEW COMPARISON:  09/16/2022. FINDINGS: Nasogastric tube passes below the diaphragm, tip in the mid stomach. Normal bowel gas pattern. IMPRESSION: Well-positioned nasal/orogastric tube. Electronically Signed   By: Amie Portland M.D.   On: 10/03/2022 12:56        Scheduled Meds:  sodium chloride   Intravenous Once   Chlorhexidine Gluconate Cloth  6 each Topical Daily   docusate  100 mg Per Tube BID   enoxaparin (LOVENOX) injection  40 mg Subcutaneous Q24H   feeding supplement (PROSource TF20)  60 mL Per Tube BID    hydrocortisone sod succinate (SOLU-CORTEF) inj  50 mg Intravenous Q12H   insulin aspart  0-9 Units Subcutaneous Q4H   polyethylene glycol  17 g Per Tube Daily   sodium chloride flush  10-40 mL Intracatheter Q12H   sodium chloride flush  5 mL Intracatheter Q8H   Continuous Infusions:  sodium chloride Stopped (10/02/22 2010)   sodium chloride 10 mL/hr at 10/05/22 0800   cefTRIAXone (ROCEPHIN)  IV Stopped (10/04/22 1649)   feeding supplement (VITAL 1.5 CAL) Stopped (10/05/22 0230)   TPN ADULT (ION) 75 mL/hr at 10/05/22 0800   TPN ADULT (ION)       LOS: 16 days    Time spent: 56 minutes spent on chart review, discussion with nursing staff, consultants, updating family and interview/physical exam; more than 50% of that time was spent in counseling and/or coordination of care.    Alvira Philips  [place], DO [facility] Available via Epic secure chat 7am-7pm After these hours, please refer to coverage provider listed on [email or web address] 10/05/2022, 10:13 AM

## 2022-10-05 NOTE — Progress Notes (Signed)
PHARMACY - TOTAL PARENTERAL NUTRITION CONSULT NOTE   Indication: Prolonged ileus  Patient Measurements: Height: 5\' 8"  (172.7 cm) Weight: 95 kg (209 lb 7 oz) IBW/kg (Calculated) : 68.4 TPN AdjBW (KG): 81 Body mass index is 31.84 kg/m.  Assessment: 10 yoM admitted on 5/23 with abdominal pain related to newly diagnosed Stage IV large B cell lymphoma with a large hepatic flexure mass, partial large bowel obstruction.  He was supposed to start chemo, but developed severe acute abdominal pain on 5/28.  NG tube placed, imaging to confirm placement shows possible free air and he was taken to OR on 5/28. Pharmacy consulted to dose TPN for prolonged ileus.    CT with increased fluid collection in right abdomen around surgical drain. IR placed drain 6/3. Patient later intubated and started on vasopressors. Patient with extravascular volume, given albumin 6/4. TPN concentrated 6/4 in attempt to reduce fluid burden. Patient remains intubated but with reduced vasopressor requirements on 6/5. Fever curve and WBC improved. Patient extubated 6/6 and off of vasopressors. Tube feeds started at trickle 6/6, but patient with episode of vomiting 6/7 am and tube feeds subsequently held.  Glucose / Insulin: No hx DM, no PTA medications.   - Tapering steroids - CBGs improved, mostly within goal < 150 - sensitive SSI: 1 units / 24 hrs Electrolytes: corrected Ca 9.72 Renal: SCr low, BUN WNL Hepatic: AST/ALT, Tbili WNL. TG 201 Intake / Output: - Drains 240 mL - NG/emesis 490 mL - Urine 2175 mL - ostomy: 890 mL GI Imaging:  -6/2 CT abd/pelvis - large irregular fluid collection in the right abdomen lies in the location of the previous seen large necrotic mass -5/28  Lucency in the right upper quadrant was not seen on the previous images. Free intraperitoneal air is not excluded.  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  - 6/3 IR drainage catheter placed  into posterior pelvic fluid collection  Central access: Port (09/20/22), Triple lumen PICC placed 5/29 TPN start date: 5/29  Nutritional Goals: Goal TPN rate of 75 mL/hr provides 131 g of protein and 2228 kcals per day (Note rate reduction in attempt to reduce fluid burden)  RD Assessment: updated 6/4 following intubation Estimated Needs Total Energy Estimated Needs: 2250-2450 kcals Total Protein Estimated Needs: 120-140 grams Total Fluid Estimated Needs: >/= 2.2L  Current Nutrition: tube feeds started 6/6  Plan:  -Patient started on tube feeds 6/6 - episode of vomiting 6/7 am, tube feeds held and then restarted later in day with plan for slow titration to goal -Patient with nausea early 6/8 with tube feeds at 20 ml/hr, subsequently held again -Maintain concentrated TPN at goal 75 ml/hr for now -Once tolerating tube feeds and able to advance, will plan to wean off TPN -Electrolytes in TPN: Na 50 mEq/L, K 70 mEq/L, Ca 3 mEq/L, Mg 5 mEq/L, and Phos 20 mmol/L. Cl:Ac 1:1 -Add standard MVI and trace elements to TPN -Continue sensitive SSI q4h and adjust as needed  -Monitor TPN labs on Mon/Thurs and PRN   Pricilla Riffle, PharmD, BCPS Clinical Pharmacist 10/05/2022 9:57 AM

## 2022-10-05 NOTE — Progress Notes (Signed)
PT Cancellation Note  Patient Details Name: Justin Cameron MRN: [id number] DOB: January 24, 1956   Cancelled Treatment:    Reason Eval/Treat Not Completed: Medical issues which prohibited therapy; Hgb 6.8, defer at this time.   [facility] 10/05/2022, 9:00 AM

## 2022-10-05 NOTE — Progress Notes (Signed)
Patients wound vac alarming occluded, clot found in tubing, and dressing leaked around wound vac tubing.  Large clot found on surface of black foam another clot in top of canister. Wound vac dressing and canister exchanged.

## 2022-10-05 NOTE — Progress Notes (Signed)
11 Days Post-Op   Subjective/Chief Complaint: Awake and alert, no complaints this am   Objective: Vital signs in last 24 hours: Temp:  [97.6 F (36.4 C)-98.3 F (36.8 C)] 98.3 F (36.8 C) (06/08 0812) Pulse Rate:  [85-97] 86 (06/08 0800) Resp:  [19-29] 24 (06/08 0800) BP: (97-146)/(46-82) 126/59 (06/08 0800) SpO2:  [86 %-99 %] 98 % (06/08 0800) Arterial Line BP: (147)/(52) 147/52 (06/07 1000) Weight:  [95 kg] 95 kg (06/08 0236) Last BM Date : 10/05/22  Intake/Output from previous day: 06/07 0701 - 06/08 0700 In: 1607.9 [I.V.:1229.3; NG/GT:258.7; IV Piggyback:100] Out: 3360 [Urine:1750; Emesis/NG output:490; Drains:355; Stool:765] Intake/Output this shift: Total I/O In: 85 [I.V.:85] Out: -   Ab approp tender, drains as expected with minimal output, vac in place    Lab Results:  Recent Labs    10/04/22 0436 10/05/22 0554  WBC 18.9* 12.1*  HGB 7.1* 6.8*  HCT 22.2* 21.8*  PLT 250 284   BMET Recent Labs    10/04/22 0436 10/05/22 0554  NA 143 147*  K 3.3* 3.5  CL 107 111  CO2 26 27  GLUCOSE 128* 134*  BUN 24* 26*  CREATININE 0.52* 0.60*  CALCIUM 8.0* 8.2*   PT/INR No results for input(s): "LABPROT", "INR" in the last 72 hours. ABG No results for input(s): "PHART", "HCO3" in the last 72 hours.  Invalid input(s): "PCO2", "PO2"  Studies/Results: DG Abd Portable 1V  Result Date: 10/03/2022 CLINICAL DATA:  NG tube placement. EXAM: PORTABLE ABDOMEN - 1 VIEW COMPARISON:  09/16/2022. FINDINGS: Nasogastric tube passes below the diaphragm, tip in the mid stomach. Normal bowel gas pattern. IMPRESSION: Well-positioned nasal/orogastric tube. Electronically Signed   By: Amie Portland M.D.   On: 10/03/2022 12:56    Anti-infectives: Anti-infectives (From admission, onward)    Start     Dose/Rate Route Frequency Ordered Stop   10/03/22 1400  cefTRIAXone (ROCEPHIN) 2 g in sodium chloride 0.9 % 100 mL IVPB        2 g 200 mL/hr over 30 Minutes Intravenous Every 24 hours  10/03/22 1151     10/01/22 2000  vancomycin (VANCOREADY) IVPB 1750 mg/350 mL  Status:  Discontinued        1,750 mg 175 mL/hr over 120 Minutes Intravenous Every 24 hours 09/30/22 1748 10/03/22 0851   10/01/22 1945  meropenem (MERREM) 1 g in sodium chloride 0.9 % 100 mL IVPB  Status:  Discontinued        1 g 200 mL/hr over 30 Minutes Intravenous Every 8 hours 10/01/22 1851 10/03/22 1151   09/30/22 1845  vancomycin (VANCOREADY) IVPB 2000 mg/400 mL        2,000 mg 200 mL/hr over 120 Minutes Intravenous  Once 09/30/22 1748 09/30/22 2317   09/30/22 0800  piperacillin-tazobactam (ZOSYN) IVPB 3.375 g  Status:  Discontinued        3.375 g 12.5 mL/hr over 240 Minutes Intravenous Every 8 hours 09/30/22 0701 10/01/22 1827   09/20/22 1402  ceFAZolin (ANCEF) IVPB 2g/100 mL premix        over 30 Minutes Intravenous Continuous PRN 09/20/22 1411 09/20/22 1402   09/20/22 0600  piperacillin-tazobactam (ZOSYN) IVPB 3.375 g  Status:  Discontinued        3.375 g 12.5 mL/hr over 240 Minutes Intravenous Every 8 hours 09/03/2022 1943 09/28/22 1134   08/28/2022 1800  amoxicillin-clavulanate (AUGMENTIN) 875-125 MG per tablet 1 tablet  Status:  Discontinued        1 tablet Oral Every 12 hours  09/26/2022 1659 09/01/2022 1939       Assessment/Plan: POD 11 s/p ex lap with right colectomy and end ileostomy- TC B cell lymphoma -can do tube feeds as tolerated, TPN, can have sips with meds and ice chips -SIRS after IR drain placement into pelvic collection.  Much better, extubated and off pressors -continue abx -continue wound care- hopefully this remains without evisceration, don't really have good options at this point and going back to OR would not be ideal and likely no better solution, white sponge and vac 75 placed yesterday, change monday   FEN - TPN, sips/chips VTE - ok for chemical prophylaxis from surgical standpoint, IVC filter ID - vanc/zosyn   Acute hypoxic respiratory failure - per CCM B-cell lymphoma SIRS  - s/p IR drain placement LLE DVT SPCM - TNA ABL anemia Justin Cameron 10/05/2022

## 2022-10-05 NOTE — Plan of Care (Signed)
  Problem: Education: Goal: Knowledge of General Education information will improve Description: Including pain rating scale, medication(s)/side effects and non-pharmacologic comfort measures Outcome: Progressing   Problem: Health Behavior/Discharge Planning: Goal: Ability to manage health-related needs will improve Outcome: Progressing   Problem: Clinical Measurements: Goal: Ability to maintain clinical measurements within normal limits will improve Outcome: Progressing Goal: Will remain free from infection Outcome: Progressing Goal: Diagnostic test results will improve Outcome: Progressing Goal: Respiratory complications will improve Outcome: Progressing Goal: Cardiovascular complication will be avoided Outcome: Progressing   Problem: Activity: Goal: Risk for activity intolerance will decrease Outcome: Progressing   Problem: Nutrition: Goal: Adequate nutrition will be maintained Outcome: Progressing   Problem: Coping: Goal: Level of anxiety will decrease Outcome: Progressing   Problem: Elimination: Goal: Will not experience complications related to bowel motility Outcome: Progressing Goal: Will not experience complications related to urinary retention Outcome: Progressing   Problem: Pain Managment: Goal: General experience of comfort will improve Outcome: Progressing   Problem: Safety: Goal: Ability to remain free from injury will improve Outcome: Progressing   Problem: Skin Integrity: Goal: Risk for impaired skin integrity will decrease Outcome: Progressing   Problem: Education: Goal: Knowledge of the prescribed therapeutic regimen will improve Outcome: Progressing   Problem: Activity: Goal: Ability to implement measures to reduce episodes of fatigue will improve Outcome: Progressing   Problem: Bowel/Gastric: Goal: Will not experience complications related to bowel motility Outcome: Progressing   Problem: Coping: Goal: Ability to identify and develop  effective coping behavior will improve Outcome: Progressing   Problem: Nutritional: Goal: Maintenance of adequate nutrition will improve Outcome: Progressing   Problem: Nutritional: Goal: Progress toward achieving an optimal weight will improve Outcome: Progressing   Problem: Skin Integrity: Goal: Risk for impaired skin integrity will decrease Outcome: Progressing   Problem: Tissue Perfusion: Goal: Adequacy of tissue perfusion will improve Outcome: Progressing   Problem: Activity: Goal: Ability to tolerate increased activity will improve Outcome: Completed/Met   Problem: Respiratory: Goal: Ability to maintain a clear airway and adequate ventilation will improve Outcome: Completed/Met   Problem: Role Relationship: Goal: Method of communication will improve Outcome: Completed/Met   Problem: Education: Goal: Ability to manage disease process will improve Outcome: Completed/Met   Problem: Cardiac: Goal: Ability to achieve and maintain adequate cardiopulmonary perfusion will improve Outcome: Completed/Met   Problem: Neurologic: Goal: Promote progressive neurologic recovery Outcome: Completed/Met   Problem: Skin Integrity: Goal: Risk for impaired skin integrity will be minimized. Outcome: Completed/Met   Problem: Education: Goal: Ability to describe self-care measures that may prevent or decrease complications (Diabetes Survival Skills Education) will improve Outcome: Not Applicable Goal: Individualized Educational Video(s) Outcome: Not Applicable   Problem: Coping: Goal: Ability to adjust to condition or change in health will improve Outcome: Not Applicable   Problem: Fluid Volume: Goal: Ability to maintain a balanced intake and output will improve Outcome: Not Applicable   Problem: Health Behavior/Discharge Planning: Goal: Ability to identify and utilize available resources and services will improve Outcome: Not Applicable Goal: Ability to manage  health-related needs will improve Outcome: Not Applicable   Problem: Metabolic: Goal: Ability to maintain appropriate glucose levels will improve Outcome: Not Applicable   Problem: Nutritional: Goal: Maintenance of adequate nutrition will improve Outcome: Not Applicable   Cindy S. Clelia Croft BSN, RN, CCRP 10/05/2022 6:15 AM

## 2022-10-06 ENCOUNTER — Inpatient Hospital Stay (HOSPITAL_COMMUNITY): Payer: Medicare Other

## 2022-10-06 DIAGNOSIS — C8330 Diffuse large B-cell lymphoma, unspecified site: Secondary | ICD-10-CM | POA: Diagnosis not present

## 2022-10-06 LAB — CBC
HCT: 28.3 % — ABNORMAL LOW (ref 39.0–52.0)
Hemoglobin: 8.8 g/dL — ABNORMAL LOW (ref 13.0–17.0)
MCH: 25.9 pg — ABNORMAL LOW (ref 26.0–34.0)
MCHC: 31.1 g/dL (ref 30.0–36.0)
MCV: 83.2 fL (ref 80.0–100.0)
Platelets: 293 10*3/uL (ref 150–400)
RBC: 3.4 MIL/uL — ABNORMAL LOW (ref 4.22–5.81)
RDW: 18.6 % — ABNORMAL HIGH (ref 11.5–15.5)
WBC: 13.6 10*3/uL — ABNORMAL HIGH (ref 4.0–10.5)
nRBC: 0.1 % (ref 0.0–0.2)

## 2022-10-06 LAB — BASIC METABOLIC PANEL
Anion gap: 8 (ref 5–15)
BUN: 28 mg/dL — ABNORMAL HIGH (ref 8–23)
CO2: 25 mmol/L (ref 22–32)
Calcium: 8.1 mg/dL — ABNORMAL LOW (ref 8.9–10.3)
Chloride: 114 mmol/L — ABNORMAL HIGH (ref 98–111)
Creatinine, Ser: 0.5 mg/dL — ABNORMAL LOW (ref 0.61–1.24)
GFR, Estimated: >60 mL/min (ref >60)
Glucose, Bld: 125 mg/dL — ABNORMAL HIGH (ref 70–99)
Potassium: 3.5 mmol/L (ref 3.5–5.1)
Sodium: 147 mmol/L — ABNORMAL HIGH (ref 135–145)

## 2022-10-06 LAB — GLUCOSE, CAPILLARY
Glucose-Capillary: 125 mg/dL — ABNORMAL HIGH (ref 70–99)
Glucose-Capillary: 109 mg/dL — ABNORMAL HIGH (ref 70–99)
Glucose-Capillary: 124 mg/dL — ABNORMAL HIGH (ref 70–99)
Glucose-Capillary: 122 mg/dL — ABNORMAL HIGH (ref 70–99)
Glucose-Capillary: 113 mg/dL — ABNORMAL HIGH (ref 70–99)

## 2022-10-06 LAB — PHOSPHORUS: Phosphorus: 4.5 mg/dL (ref 2.5–4.6)

## 2022-10-06 LAB — MAGNESIUM: Magnesium: 2.3 mg/dL (ref 1.7–2.4)

## 2022-10-06 MED ORDER — HYDROCORTISONE SOD SUC (PF) 100 MG IJ SOLR
50.0000 mg | Freq: Every day | INTRAMUSCULAR | Status: AC
  Administered 2022-10-07 – 2022-10-08 (×2): 50 mg via INTRAVENOUS
  Filled 2022-10-06 (×2): qty 2

## 2022-10-06 MED ORDER — MELATONIN 5 MG PO TABS
5.0000 mg | ORAL_TABLET | Freq: Once | ORAL | Status: AC
  Administered 2022-10-06: 5 mg via ORAL
  Filled 2022-10-06: qty 1

## 2022-10-06 MED ORDER — STERILE WATER FOR INJECTION IJ SOLN
INTRAMUSCULAR | Status: AC
  Administered 2022-10-06: 10 mL
  Filled 2022-10-06: qty 10

## 2022-10-06 MED ORDER — FENTANYL CITRATE PF 50 MCG/ML IJ SOSY
50.0000 ug | PREFILLED_SYRINGE | INTRAMUSCULAR | Status: AC
  Administered 2022-10-06: 50 ug via INTRAVENOUS
  Filled 2022-10-06: qty 1

## 2022-10-06 MED ORDER — TRACE MINERALS CU-MN-SE-ZN 300-55-60-3000 MCG/ML IV SOLN
INTRAVENOUS | Status: AC
  Filled 2022-10-06: qty 876

## 2022-10-06 NOTE — Progress Notes (Signed)
PHARMACY - TOTAL PARENTERAL NUTRITION CONSULT NOTE   Indication: Prolonged ileus  Patient Measurements: Height: 5\' 8"  (172.7 cm) Weight: 93.1 kg (205 lb 4 oz) IBW/kg (Calculated) : 68.4 TPN AdjBW (KG): 81 Body mass index is 31.21 kg/m.  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  He was supposed to start chemo, but developed severe acute abdominal pain on 5/28.  NG tube placed, imaging to confirm placement shows possible free air and he was taken to OR on 5/28. Pharmacy consulted to dose TPN for prolonged ileus.    CT with increased fluid collection in right abdomen around surgical drain. IR placed drain 6/3. Patient later intubated and started on vasopressors. Patient with extravascular volume, given albumin 6/4. TPN concentrated 6/4 in attempt to reduce fluid burden. Patient remains intubated but with reduced vasopressor requirements on 6/5. Fever curve and WBC improved. Patient extubated 6/6 and off of vasopressors. Tube feeds started at trickle 6/6, but patient with episode of vomiting 6/7 am and tube feeds subsequently held.  So far patient has been unable to tolerate tube feeds secondary to N/V and NGT has been to suction.  Glucose / Insulin: No hx DM, no PTA medications.   - Tapering steroids - CBGs improved, mostly within goal < 150 - sensitive SSI: 2 units / 24 hrs Electrolytes: corrected Ca 9.62 Renal: SCr low, BUN slightly elevated Hepatic: AST/ALT, Tbili WNL. TG 201 Intake / Output: 24 hr - Drains 187 mL - NG/emesis 75 mL so far today - Urine 1550 mL - ostomy: 750 mL GI Imaging:  -6/2 CT abd/pelvis - large irregular fluid collection in the right abdomen lies in the location of the previous seen large necrotic mass -5/28  Lucency in the right upper quadrant was not seen on the previous images. Free intraperitoneal air is not excluded.   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  - 6/3 IR drainage catheter placed into posterior pelvic fluid collection  Central access: Port (09/20/22), Triple lumen PICC placed 5/29 TPN start date: 5/29  Nutritional Goals: Goal TPN rate of 75 mL/hr provides 131 g of protein and 2228 kcals per day (Note rate reduction in attempt to reduce fluid burden)  RD Assessment: updated 6/4 Estimated Needs Total Energy Estimated Needs: 2250-2450 kcals Total Protein Estimated Needs: 120-140 grams Total Fluid Estimated Needs: >/= 2.2L  Current Nutrition: tube feeds started 6/6, however patient has yet to tolerate and is currently not on TF  Plan:  -Patient yet to tolerate tube feeds, currently off with NGT to suction -Maintain concentrated TPN at goal 75 ml/hr for now -Once tolerating tube feeds and able to advance, will plan to wean off TPN -Electrolytes in TPN: Na 50 mEq/L, K 70 mEq/L, Ca 3 mEq/L, Mg 5 mEq/L, and Phos 20 mmol/L. Cl:Ac 1:1 *Mild hypernatremia - Na previously reduced, will consider further reduction if remains slightly up -Add standard MVI and trace elements to TPN -Continue sensitive SSI q4h and adjust as needed  -Monitor TPN labs on Mon/Thurs and PRN   Pricilla Riffle, PharmD, BCPS Clinical Pharmacist 10/06/2022 10:21 AM

## 2022-10-06 NOTE — Progress Notes (Signed)
12 Days Post-Op   Subjective/Chief Complaint: Still with some n/v, otherwise alert/oriented   Objective: Vital signs in last 24 hours: Temp:  [97.7 F (36.5 C)-99.1 F (37.3 C)] 98 F (36.7 C) (06/09 0400) Pulse Rate:  [69-95] 83 (06/09 0700) Resp:  [15-27] 19 (06/09 0700) BP: (117-151)/(48-89) 138/58 (06/09 0700) SpO2:  [92 %-100 %] 92 % (06/09 0700) Weight:  [93.1 kg] 93.1 kg (06/09 0318) Last BM Date : 10/05/22  Intake/Output from previous day: 06/08 0701 - 06/09 0700 In: 2967.7 [I.V.:2023.2; Blood:686.8; NG/GT:150; IV Piggyback:97.7] Out: 1922 [Urine:1000; Emesis/NG output:75; Drains:122; Stool:725] Intake/Output this shift: No intake/output data recorded.    Lab Results:  Ab approp tender, drains as expected with minimal output, vac in place with some clot    BMET Recent Labs    10/05/22 0554 10/06/22 0305  NA 147* 147*  K 3.5 3.5  CL 111 114*  CO2 27 25  GLUCOSE 134* 125*  BUN 26* 28*  CREATININE 0.60* 0.50*  CALCIUM 8.2* 8.1*   PT/INR No results for input(s): "LABPROT", "INR" in the last 72 hours. ABG No results for input(s): "PHART", "HCO3" in the last 72 hours.  Invalid input(s): "PCO2", "PO2"  Studies/Results: No results found.  Anti-infectives: Anti-infectives (From admission, onward)    Start     Dose/Rate Route Frequency Ordered Stop   10/03/22 1400  cefTRIAXone (ROCEPHIN) 2 g in sodium chloride 0.9 % 100 mL IVPB        2 g 200 mL/hr over 30 Minutes Intravenous Every 24 hours 10/03/22 1151     10/01/22 2000  vancomycin (VANCOREADY) IVPB 1750 mg/350 mL  Status:  Discontinued        1,750 mg 175 mL/hr over 120 Minutes Intravenous Every 24 hours 09/30/22 1748 10/03/22 0851   10/01/22 1945  meropenem (MERREM) 1 g in sodium chloride 0.9 % 100 mL IVPB  Status:  Discontinued        1 g 200 mL/hr over 30 Minutes Intravenous Every 8 hours 10/01/22 1851 10/03/22 1151   09/30/22 1845  vancomycin (VANCOREADY) IVPB 2000 mg/400 mL        2,000  mg 200 mL/hr over 120 Minutes Intravenous  Once 09/30/22 1748 09/30/22 2317   09/30/22 0800  piperacillin-tazobactam (ZOSYN) IVPB 3.375 g  Status:  Discontinued        3.375 g 12.5 mL/hr over 240 Minutes Intravenous Every 8 hours 09/30/22 0701 10/01/22 1827   09/20/22 1402  ceFAZolin (ANCEF) IVPB 2g/100 mL premix        over 30 Minutes Intravenous Continuous PRN 09/20/22 1411 09/20/22 1402   09/20/22 0600  piperacillin-tazobactam (ZOSYN) IVPB 3.375 g  Status:  Discontinued        3.375 g 12.5 mL/hr over 240 Minutes Intravenous Every 8 hours 09/23/2022 1943 09/28/22 1134   09/23/2022 1800  amoxicillin-clavulanate (AUGMENTIN) 875-125 MG per tablet 1 tablet  Status:  Discontinued        1 tablet Oral Every 12 hours 09/13/2022 1659 09/18/2022 1939       Assessment/Plan: POD 12 s/p ex lap with right colectomy and end ileostomy- TC B cell lymphoma -can do tube feeds as tolerated, TPN, can have sips with meds and ice chips-still with some n/v at this point, will check plain film today -SIRS after IR drain placement into pelvic collection.  Much better, extubated and off pressors -continue abx -continue wound care- hopefully this remains without evisceration, don't really have good options at this point and going back to  OR would not be ideal and likely no better solution, white sponge and vac 75 will need change Monday -at some point needs repeat imaging given IR drain likely early this week   FEN - TPN, sips/chips VTE - lovenox IVC filter ID - rocephin   Acute hypoxic respiratory failure - per CCM B-cell lymphoma SIRS - s/p IR drain placement LLE DVT PCM - TPN ABL anemia   Emelia Loron 10/06/2022

## 2022-10-06 NOTE — Progress Notes (Signed)
Wound vac changed this AM. Mepitel not available at this time to place for base of wound vac. Wet to dry dressing placed. CCS okay with wet to dry today as WOC scheduled to replace wound vac tomorrow.

## 2022-10-06 NOTE — Progress Notes (Signed)
PROGRESS NOTE    Justin Cameron  OZH:[id number] DOB: [birth year] DOA: 09/12/2022 PCP: Daisy Floro, MD    Brief Narrative:   Justin Cameron is a 67 y.o. male with past medical history significant for HLD, OA, anxiety/depression, OSA, recent diagnosis of diffuse large B-cell lymphoma who presented to [facility] as a direct admission by his oncology clinic with progressive abdominal pain, generalized fatigue, poor oral intake and unintentional weight loss.  Patient followed by [facility] with colonoscopy on 08/21/2022 that was indeterminant with repeat colonoscopy 09/05/2022 that showed an aggressive large B-cell lymphoma.  Patient was seen by general surgery, Dr. Maisie Fus who recommended medical oncology and no role for surgery.   Day prior patient was seen in the ED and CT abdomen/pelvis on admission notable for enlarging mass hepatic flexure, increased pericolonic fat stranding with loss of fat plane between the mass in the right lobe of the liver, increasing mesenteric lymphadenopathy, new soft tissue mass along the crus of the left abdomen enlarging bilateral adrenal masses consistent with progressive intra-abdominal metastases, new and enlarging left lower lobe pulmonary nodules consistent with progressive metastatic pulmonary involvement.  Patient was given IV fluid hydration and discharged for follow-up with medical oncology.  Plan to follow-up with medical oncology clinic day of admission he was noted be in moderate distress due to abdominal pain and bloating and was accepted for direct admission by the hospitalist service.  Significant Hospital events: 5/23: Direct admit from home per medical oncology to [facility] 5/23: IR consulted and Port-A-Cath placed 5/24: Started rituximab per oncology 5/28: General surgery consulted for large bowel obstruction in setting of stage IV DLBCL; exploratory laparotomy with right hemicolectomy and end ileostomy by Dr. Luisa Hart; transfer to ICU  under PCCM service as remained on ventilatory support following surgery; started on TPN 5/29: IR consulted for IVC filter placement 5/30: Transfused 1 unit PRBC 5/31: Transferred back to hospitalist service 6/3: IR consulted for CT drain placement due to repeat CT findings of large collection in right abdomen and surgical bed; following drain placement patient became tachycardic, hypotensive febrile, PCCM reconsulted and patient was intubated and transferred back to ICU with septic shock with broadening of antibiotics 6/6: GOC discussed, CODE STATUS changed to DNR, extubated 6/7: Weaning stress dose steroids 6/8: Transferred back to [facility]   Assessment & Plan:   Septic shock secondary to intra-abdominal right lower quadrant abscess secondary to right colon perforation in the setting of diffuse large B-cell lymphoma E. coli intra-abdominal abscess Patient presenting as a direct admission per oncology request with diffuse abdominal pain with findings of progressive mass hepatic flexure, increased mesenteric lymphadenopathy, adrenal masses progressive intra-abdominal metastasis and new pulmonary nodules consistent with metastatic process.  Oncology was initially consulted for initiation of chemotherapy given his lymphoma and patient was started on rituximab on 5/24.  Patient's condition continued decline and general surgery was consulted on 5/28 and underwent exploratory laparotomy with right hemicolectomy/end ileostomy by Dr. Luisa Hart.  Hospitalization was further complicated septic shock due to large intra-abdominal abscess postoperatively and IR drain was placed and patient was placed on broadened antibiotics.  Culture from drain placement with moderate to E. coli. -- General surgery, medical oncology following; appreciate assistance -- WBC 10.6>>>42.6>>>10.4>>>33.5>>19.7>23.5>18.9>12.1>13.6 -- Ceftriaxone 2 g IV every 24 hours -- Continue JP drain, continue to monitor output; management per general  surgery/IR -- Continue local wound care, has wound VAC to abdominal surgical incision that was started on 6/7 -- Not a candidate for restarting chemotherapy until  wound heals per medical oncology -- Repeat abdominal x-ray today per general surgery -- CBC daily  Adrenal insufficiency Reported adrenal insufficiency with adrenal lesions, cortisol level 16.3 on 10-19-2022, within normal limits.  Started on IV hydrocortisone during his intensive care unit stay due to underlying septic shock. -- Continue to slowly wean hydrocortisone, currently on 50 mg IV q12; decreased to 50 mg IV daily for 3 additional doses and discontinue  Acute hypoxic/hypercarbic respiratory failure in the setting of septic shock Following IR drain placement, patient became febrile, tachycardic, tachypneic and was subsequently intubated in the setting of septic shock from intra-abdominal abscess complicated by his progressive diffuse large B-cell lymphoma and large bowel obstruction requiring hemicolectomy and end ileostomy.  Patient was intubated on 6/3 and successfully extubated on 6/6. -- Continue supplemental oxygen, maintain SpO2 greater than 92%, currently on 6 L nasal cannula  At risk for tumor lysis syndrome History of gout Medical oncology following, plan to resume allopurinol when able to safely use GI tract, currently not tolerating tube feeds  DVT left lower extremity s/p IVC Not a candidate for anticoagulation per oncology.  IR was consulted and IVC filter placed 5/29  Atrial fibrillation with RVR Etiology likely secondary to septic shock, hypovolemia.  Initially treated with IV metoprolol followed by amiodarone. --Monitor on telemetry  Anemia in the setting of critical illness -- Hgb 11.8>>>6.9>>>9.2>>7.7>7.3>7.1>6.8>8.8 -- transfused 2 unit PRBC 5/30 -- Transfused 2 unit PRBC 6/8 -- CBC daily  Severe protein calorie malnutrition Nutrition Status: Nutrition Problem: Severe Malnutrition Etiology: chronic  illness (non-Hodgkin's lymphoma) Signs/Symptoms: mild fat depletion, mild muscle depletion, energy intake < or equal to 75% for > or equal to 1 month, percent weight loss (11% in less than 1 month) Percent weight loss: 11 % (in less than 1 month) Interventions: Refer to RD note for recommendations, TPN -- Dietitian following, on TPN, currently intolerant of tube feeds, continue to challenge daily -- Continue TPN, pharmacy consulted for management  Significant weakness/debility/deconditioning: -- Continue to encourage aggressive therapy PT/OT   DVT prophylaxis: enoxaparin (LOVENOX) injection 40 mg Start: 10/01/22 1100    Code Status: DNR Family Communication: No family present at bedside this morning  Disposition Plan:  Level of care: ICU Status is: Inpatient Remains inpatient appropriate because: IV antibiotics, on TPN, needs further advancement of tube feeds with toleration before able to consider discharge, pending general surgery/oncology sign off, overall prognosis extremely poor in the setting of open abdominal wound in the setting of diffuse large B-cell lymphoma currently not on chemotherapy    Consultants:  General surgery Medical oncology Interventional radiology  Procedures:  Port-A-Cath placement 19-Oct-2022 Right hemicolectomy with end ileostomy, Dr. Luisa Hart 5/28 IVC filter placement by IR 5/29 IR drain placement 6/3 Intubation 6/3 Extubation 6/6 Wound VAC placement 6/7  Antimicrobials:  Zosyn 5/22 - 5/31 Cefazolin 5/24 - 5/24 Vancomycin 6/3 - 6/5 Meropenem 6/4 - 6/5 Ceftriaxone 6/6>>   Subjective: Patient seen examined bedside, lying in bed.  Ill in appearance.  Continues to be alert and oriented, has not been tolerating tube feeds.  Asking for something to drink this morning, dry mouth.  Seen by general surgery, Dr. Dwain Sarna this morning.  Repeating abdominal x-ray.  Hemoglobin up to 8.8 today following transfusion yesterday.  Overall very guarded prognosis given  his diffuse large B-cell lymphoma currently not on chemotherapy with concern for open abdominal wound with potential of evisceration per general surgery.  Objective: Vitals:   10/06/22 0800 10/06/22 0900 10/06/22 1000 10/06/22 1100  BP: (!) 128/56 (!) 141/54 (!) 150/80 (!) 151/54  Pulse: 81 71 73 84  Resp: 18 16 20  (!) 21  Temp:      TempSrc:      SpO2: 95% 97% 97% 99%  Weight:      Height:        Intake/Output Summary (Last 24 hours) at 10/06/2022 1157 Last data filed at 10/06/2022 1117 Gross per 24 hour  Intake 2456.06 ml  Output 1272 ml  Net 1184.06 ml   Filed Weights   10/04/22 0500 10/05/22 0236 10/06/22 0318  Weight: 96 kg 95 kg 93.1 kg    Examination:  Physical Exam: GEN: NAD, alert and oriented x 3, ill in appearance, appears older than stated age HEENT: NCAT, PERRL, EOMI, sclera clear, dry mucous membranes PULM: CTAB w/o wheezes/crackles, normal respiratory effort CV: RRR w/o M/G/R GI: abd soft, mild distention, mild TTP abd, faint BS; midline abdominal wound VAC noted, drains noted. MSK: no peripheral edema, moves all extremities independently NEURO: No focal neurological deficits appreciated PSYCH: Depressed mood, flat affect     Data Reviewed: I have personally reviewed following labs and imaging studies  CBC: Recent Labs  Lab 10/01/22 0530 10/02/22 0552 10/02/22 1830 10/03/22 0614 10/04/22 0436 10/05/22 0554 10/05/22 1837 10/06/22 0305  WBC 31.0* 19.7*  --  23.5* 18.9* 12.1*  --  13.6*  NEUTROABS 27.9* 16.5*  --  21.7* 16.8* 9.6*  --   --   HGB 8.4* 7.2*   < > 7.3* 7.1* 6.8* 8.8* 8.8*  HCT 27.6* 23.5*   < > 25.3* 22.2* 21.8* 28.4* 28.3*  MCV 82.9 82.7  --  94.1 80.7 82.3  --  83.2  PLT 212 196  --  222 250 284  --  293   < > = values in this interval not displayed.   Basic Metabolic Panel: Recent Labs  Lab 10/02/22 0956 10/03/22 0702 10/04/22 0436 10/04/22 1710 10/05/22 0554 10/05/22 1837 10/06/22 0305  NA 136 133* 143  --  147*  --   147*  K 3.7 4.2 3.3*  --  3.5  --  3.5  CL 102 101 107  --  111  --  114*  CO2 25 25 26   --  27  --  25  GLUCOSE 140* 202* 128*  --  134*  --  125*  BUN 23 21 24*  --  26*  --  28*  CREATININE 0.65 0.45* 0.52*  --  0.60*  --  0.50*  CALCIUM 7.3* 7.6* 8.0*  --  8.2*  --  8.1*  MG  --  1.9 2.0 2.2  --  2.2 2.3  PHOS  --  2.8 2.6 3.5  --  5.3* 4.5   GFR: Estimated Creatinine Clearance: 100.6 mL/min (A) (by C-G formula based on SCr of 0.5 mg/dL (L)). Liver Function Tests: Recent Labs  Lab 09/30/22 1825 10/01/22 0414 10/01/22 0530 10/02/22 0956 10/03/22 0702  AST 36 SPECIMEN CONTAMINATED, UNABLE TO PERFORM TEST(S). 27 21 18   ALT 15 SPECIMEN CONTAMINATED, UNABLE TO PERFORM TEST(S). 13 11 13   ALKPHOS 141* SPECIMEN CONTAMINATED, UNABLE TO PERFORM TEST(S). 113 90 122  BILITOT 1.5* SPECIMEN CONTAMINATED, UNABLE TO PERFORM TEST(S). 0.9 1.2 0.8  PROT 4.6* SPECIMEN CONTAMINATED, UNABLE TO PERFORM TEST(S). 4.2* 4.9* 5.2*  ALBUMIN 1.8* SPECIMEN CONTAMINATED, UNABLE TO PERFORM TEST(S). 1.6* 2.1* 2.1*   No results for input(s): "LIPASE", "AMYLASE" in the last 168 hours. No results for input(s): "AMMONIA" in the last 168 hours.  Coagulation Profile: Recent Labs  Lab 09/30/22 1825  INR 1.2   Cardiac Enzymes: Recent Labs  Lab 09/30/22 2046  CKTOTAL 26*   BNP (last 3 results) No results for input(s): "PROBNP" in the last 8760 hours. HbA1C: No results for input(s): "HGBA1C" in the last 72 hours. CBG: Recent Labs  Lab 10/05/22 2053 10/05/22 2320 10/06/22 0404 10/06/22 0809 10/06/22 1150  GLUCAP 107* 107* 125* 109* 124*   Lipid Profile: No results for input(s): "CHOL", "HDL", "LDLCALC", "TRIG", "CHOLHDL", "LDLDIRECT" in the last 72 hours. Thyroid Function Tests: No results for input(s): "TSH", "T4TOTAL", "FREET4", "T3FREE", "THYROIDAB" in the last 72 hours. Anemia Panel: No results for input(s): "VITAMINB12", "FOLATE", "FERRITIN", "TIBC", "IRON", "RETICCTPCT" in the last 72  hours. Sepsis Labs: Recent Labs  Lab 09/30/22 1825 09/30/22 2046 10/01/22 0420  LATICACIDVEN 3.3* 3.5* 2.3*    Recent Results (from the past 240 hour(s))  Body fluid culture w Gram Stain     Status: None   Collection Time: 10/01/22  5:40 PM   Specimen: JP Drain; Body Fluid  Result Value Ref Range Status   Specimen Description   Final    JP DRAINAGE RIGHT PELVIC Performed at [facility], [address], [place], [place] [address]    Special Requests   Final    Normal Performed at [facility], [address] [place], [place] [address]    Gram Stain   Final    MODERATE WBC PRESENT, PREDOMINANTLY PMN FEW GRAM NEGATIVE RODS RARE GRAM POSITIVE COCCI RARE GRAM POSITIVE RODS Performed at [facility], [address] [place], [place] [address]    Culture MODERATE ESCHERICHIA COLI  Final   Report Status 10/03/2022 FINAL  Final   Organism ID, Bacteria ESCHERICHIA COLI  Final      Susceptibility   Escherichia coli - MIC*    AMPICILLIN >=32 RESISTANT Resistant     CEFEPIME <=0.12 SENSITIVE Sensitive     CEFTAZIDIME <=1 SENSITIVE Sensitive     CEFTRIAXONE 0.5 SENSITIVE Sensitive     CIPROFLOXACIN <=0.25 SENSITIVE Sensitive     GENTAMICIN <=1 SENSITIVE Sensitive     IMIPENEM <=0.25 SENSITIVE Sensitive     TRIMETH/SULFA <=20 SENSITIVE Sensitive     AMPICILLIN/SULBACTAM >=32 RESISTANT Resistant     PIP/TAZO 8 SENSITIVE Sensitive     * MODERATE ESCHERICHIA COLI         Radiology Studies: No results found.      Scheduled Meds:  Chlorhexidine Gluconate Cloth  6 each Topical Daily   docusate  100 mg Per Tube BID   enoxaparin (LOVENOX) injection  40 mg Subcutaneous Q24H   feeding supplement (PROSource TF20)  60 mL Per Tube BID   hydrocortisone sod succinate (SOLU-CORTEF) inj  50 mg Intravenous Q12H   insulin aspart  0-9 Units Subcutaneous Q4H   polyethylene glycol  17 g Per Tube Daily   sodium chloride flush  10-40 mL  Intracatheter Q12H   sodium chloride flush  5 mL Intracatheter Q8H   Continuous Infusions:  sodium chloride Stopped (10/02/22 2010)   sodium chloride 10 mL/hr at 10/06/22 0600   cefTRIAXone (ROCEPHIN)  IV Stopped (10/05/22 1414)   feeding supplement (VITAL 1.5 CAL) Stopped (10/05/22 0230)   TPN ADULT (ION) 75 mL/hr at 10/06/22 0600   TPN ADULT (ION)       LOS: 17 days    Time spent: 56 minutes spent on chart review, discussion with nursing staff, consultants, updating family and interview/physical  exam; more than 50% of that time was spent in counseling and/or coordination of care.    Alvira Philips [place], DO [facility] Available via Epic secure chat 7am-7pm After these hours, please refer to coverage provider listed on [email or web address] 10/06/2022, 11:57 AM

## 2022-10-06 NOTE — Plan of Care (Signed)
  Problem: Education: Goal: Knowledge of General Education information will improve Description: Including pain rating scale, medication(s)/side effects and non-pharmacologic comfort measures Outcome: Progressing   Problem: Health Behavior/Discharge Planning: Goal: Ability to manage health-related needs will improve Outcome: Progressing   Problem: Clinical Measurements: Goal: Ability to maintain clinical measurements within normal limits will improve Outcome: Progressing Goal: Will remain free from infection Outcome: Progressing Goal: Diagnostic test results will improve Outcome: Progressing Goal: Respiratory complications will improve Outcome: Progressing Goal: Cardiovascular complication will be avoided Outcome: Progressing   Problem: Activity: Goal: Risk for activity intolerance will decrease Outcome: Progressing   Problem: Nutrition: Goal: Adequate nutrition will be maintained Outcome: Progressing   Problem: Coping: Goal: Level of anxiety will decrease Outcome: Progressing   Problem: Elimination: Goal: Will not experience complications related to bowel motility Outcome: Progressing Goal: Will not experience complications related to urinary retention Outcome: Progressing   Problem: Pain Managment: Goal: General experience of comfort will improve Outcome: Progressing   Problem: Safety: Goal: Ability to remain free from injury will improve Outcome: Progressing   Problem: Skin Integrity: Goal: Risk for impaired skin integrity will decrease Outcome: Progressing   Problem: Education: Goal: Knowledge of the prescribed therapeutic regimen will improve Outcome: Progressing   Problem: Activity: Goal: Ability to implement measures to reduce episodes of fatigue will improve Outcome: Progressing   Problem: Bowel/Gastric: Goal: Will not experience complications related to bowel motility Outcome: Progressing   Problem: Coping: Goal: Ability to identify and develop  effective coping behavior will improve Outcome: Progressing   Problem: Nutritional: Goal: Maintenance of adequate nutrition will improve Outcome: Progressing   Cindy S. Clelia Croft BSN, RN, CCRP 10/06/2022 1:57 AM

## 2022-10-07 ENCOUNTER — Inpatient Hospital Stay (HOSPITAL_COMMUNITY): Payer: Medicare Other

## 2022-10-07 DIAGNOSIS — C8330 Diffuse large B-cell lymphoma, unspecified site: Secondary | ICD-10-CM | POA: Diagnosis not present

## 2022-10-07 DIAGNOSIS — F05 Delirium due to known physiological condition: Secondary | ICD-10-CM | POA: Diagnosis present

## 2022-10-07 DIAGNOSIS — I82409 Acute embolism and thrombosis of unspecified deep veins of unspecified lower extremity: Secondary | ICD-10-CM | POA: Clinically undetermined

## 2022-10-07 DIAGNOSIS — Z95828 Presence of other vascular implants and grafts: Secondary | ICD-10-CM

## 2022-10-07 DIAGNOSIS — R9089 Other abnormal findings on diagnostic imaging of central nervous system: Secondary | ICD-10-CM | POA: Diagnosis not present

## 2022-10-07 LAB — CBC
HCT: 30.4 % — ABNORMAL LOW (ref 39.0–52.0)
Hemoglobin: 9.2 g/dL — ABNORMAL LOW (ref 13.0–17.0)
MCH: 25.8 pg — ABNORMAL LOW (ref 26.0–34.0)
MCHC: 30.3 g/dL (ref 30.0–36.0)
MCV: 85.2 fL (ref 80.0–100.0)
Platelets: 291 10*3/uL (ref 150–400)
RBC: 3.57 MIL/uL — ABNORMAL LOW (ref 4.22–5.81)
RDW: 18.9 % — ABNORMAL HIGH (ref 11.5–15.5)
WBC: 11.9 10*3/uL — ABNORMAL HIGH (ref 4.0–10.5)
nRBC: 0 % (ref 0.0–0.2)

## 2022-10-07 LAB — GLUCOSE, CAPILLARY
Glucose-Capillary: 117 mg/dL — ABNORMAL HIGH (ref 70–99)
Glucose-Capillary: 128 mg/dL — ABNORMAL HIGH (ref 70–99)
Glucose-Capillary: 137 mg/dL — ABNORMAL HIGH (ref 70–99)
Glucose-Capillary: 138 mg/dL — ABNORMAL HIGH (ref 70–99)
Glucose-Capillary: 91 mg/dL (ref 70–99)
Glucose-Capillary: 98 mg/dL (ref 70–99)
Glucose-Capillary: 99 mg/dL (ref 70–99)

## 2022-10-07 LAB — TYPE AND SCREEN
ABO/RH(D): O POS
Antibody Screen: NEGATIVE
Unit division: 0

## 2022-10-07 LAB — COMPREHENSIVE METABOLIC PANEL
ALT: 19 U/L (ref 0–44)
AST: 25 U/L (ref 15–41)
Albumin: 1.9 g/dL — ABNORMAL LOW (ref 3.5–5.0)
Alkaline Phosphatase: 116 U/L (ref 38–126)
Anion gap: 7 (ref 5–15)
BUN: 28 mg/dL — ABNORMAL HIGH (ref 8–23)
CO2: 25 mmol/L (ref 22–32)
Calcium: 8.1 mg/dL — ABNORMAL LOW (ref 8.9–10.3)
Chloride: 113 mmol/L — ABNORMAL HIGH (ref 98–111)
Creatinine, Ser: 0.57 mg/dL — ABNORMAL LOW (ref 0.61–1.24)
GFR, Estimated: >60 mL/min (ref >60)
Glucose, Bld: 103 mg/dL — ABNORMAL HIGH (ref 70–99)
Potassium: 3.4 mmol/L — ABNORMAL LOW (ref 3.5–5.1)
Sodium: 145 mmol/L (ref 135–145)
Total Bilirubin: 1.1 mg/dL (ref 0.3–1.2)
Total Protein: 5 g/dL — ABNORMAL LOW (ref 6.5–8.1)

## 2022-10-07 LAB — BPAM RBC
Blood Product Expiration Date: 202407112359
Blood Product Expiration Date: 202407112359
ISSUE DATE / TIME: 202406081120
ISSUE DATE / TIME: 202406081410
Unit Type and Rh: 5100
Unit Type and Rh: 5100

## 2022-10-07 LAB — MAGNESIUM: Magnesium: 2 mg/dL (ref 1.7–2.4)

## 2022-10-07 LAB — PHOSPHORUS: Phosphorus: 3.2 mg/dL (ref 2.5–4.6)

## 2022-10-07 LAB — TRIGLYCERIDES: Triglycerides: 134 mg/dL (ref <150)

## 2022-10-07 MED ORDER — INSULIN ASPART 100 UNIT/ML IJ SOLN
0.0000 [IU] | Freq: Four times a day (QID) | INTRAMUSCULAR | Status: DC
  Administered 2022-10-07 (×2): 1 [IU] via SUBCUTANEOUS

## 2022-10-07 MED ORDER — IOHEXOL 300 MG/ML  SOLN
75.0000 mL | Freq: Once | INTRAMUSCULAR | Status: AC | PRN
  Administered 2022-10-07: 75 mL via INTRAVENOUS

## 2022-10-07 MED ORDER — ENOXAPARIN SODIUM 40 MG/0.4ML IJ SOSY
40.0000 mg | PREFILLED_SYRINGE | INTRAMUSCULAR | Status: DC
  Administered 2022-10-07: 40 mg via SUBCUTANEOUS
  Filled 2022-10-07: qty 0.4

## 2022-10-07 MED ORDER — SODIUM CHLORIDE (PF) 0.9 % IJ SOLN
INTRAMUSCULAR | Status: AC
  Filled 2022-10-07: qty 50

## 2022-10-07 MED ORDER — TRAVASOL 10 % IV SOLN
INTRAVENOUS | Status: AC
  Filled 2022-10-07: qty 998.4

## 2022-10-07 NOTE — Progress Notes (Addendum)
Patient ID: Justin Cameron, male   DOB: 06-23-1955, 67 y.o.   MRN: [id number] Pt currently off floor getting head CT; afebrile, WBC 11.9(13.6), HGB 9.2(8.8), creat 0.57, minimal output from rt pelvic drain; plans noted for f/u CT A/P later this week

## 2022-10-07 NOTE — Evaluation (Signed)
Clinical/Bedside Swallow Evaluation Patient Details  Name: Justin Cameron MRN: [id number] Date of Birth: 02/03/1956  Today's Date: 10/07/2022 Time: SLP Start Time (ACUTE ONLY): 0935 SLP Stop Time (ACUTE ONLY): 0950 SLP Time Calculation (min) (ACUTE ONLY): 15 min  Past Medical History:  Past Medical History:  Diagnosis Date   Arthritis    High cholesterol    Past Surgical History:  Past Surgical History:  Procedure Laterality Date   IR IMAGING GUIDED PORT INSERTION  09/20/2022   IR IVC FILTER PLMT / S&I /IMG GUID/MOD SED  09/25/2022   LAPAROTOMY N/A 09/12/2022   Procedure: EXPLORATORY LAPAROTOMY, BOWEL RESECTION, ILEOSTOMY;  Surgeon: Harriette Bouillon, MD;  Location: [facility] ORS;  Service: General;  Laterality: N/A;   REPLACEMENT TOTAL KNEE  10/2018   ROTATOR CUFF REPAIR     HPI:  Patient is a 67 y.o. male with PMH: OSA,  newly diagnosed diffuse large B-cell lymphoma who initially presented to [facility] medical oncology clinic with complaints of intermittent and progressive abdominal discomfort, generalized fatigue, night sweats, poor oral intake, unintentional weight loss of 30 pounds since February 2024. He presented to [facility] ED on 09/18/22 with c/o night sweats and worsening abdominal pain, nausea and lightheadedness. IV contrast CT scan of the abdomen and pelvis revealed the following findings: Enlarging mass at the hepatic flexure increased pericolonic fat stranding with loss of fat plane between the mass in the right lobe of the liver.  Increasing mesenteric lymphadenopathy and new soft tissue mass along the crus of the left diaphragm and enlarging bilateral adrenal masses consistent with progressive intra-abdominal metastases.  New and enlarging left lower lobe pulmonary nodules consistent with progressive metastatic pulmonary involvement.  On 5/28, he underwent ex-lap and remained intubated following surgery, started on TPN. He has NG for drainage. He was extubated 5/29. CT drain placed in IR  on 6/3 and he became tachycardic and hypotensive following drain placement, was intubated and transferred back to ICU. On 6/6, GOC discussion with family and code status changed to DNR and patient was extubated.    Assessment / Plan / Recommendation  Clinical Impression  Patient is presenting with clinical s/s of dysphagia as per this bedside swallow evaluation. His oral mucosa was very dry and during oral care, SLP did remove some dried/sticky secretions on teeth and palate. Patient also had periodic cough which generally led to expectoration of thick, sticky yellowish secretions. Patient tolerated ice chips as well as water sips (via cup, straw) with no overt s/s aspiration observed. He did exhibit suspected swallow initiation delay. After swallowing water, he did state that it was "better" as compared to attempt on previous day. SLP recommending continue NPO status and continue to allow ice and/or water sips after oral care when patient awake and alert. SLP will follow and will follow for ability to advance PO consistencies pending approval from surgery. SLP Visit Diagnosis: Dysphagia, unspecified (R13.10)    Aspiration Risk  Mild aspiration risk    Diet Recommendation Ice chips PRN after oral care;Free water protocol after oral care;NPO   Liquid Administration via: Cup;Straw Medication Administration: Via alternative means Supervision: Patient able to self feed;Intermittent supervision to cue for compensatory strategies Compensations: Slow rate;Small sips/bites Postural Changes: Seated upright at 90 degrees    Other  Recommendations Oral Care Recommendations: Oral care prior to ice chip/H20;Oral care before and after PO;Oral care BID;Staff/trained caregiver to provide oral care    Recommendations for follow up therapy are one component of a multi-disciplinary discharge planning process,  led by the attending physician.  Recommendations may be updated based on patient status, additional  functional criteria and insurance authorization.  Follow up Recommendations Other (comment) (TBD, likely SLP at next venue of care)      Assistance Recommended at Discharge    Functional Status Assessment Patient has had a recent decline in their functional status and demonstrates the ability to make significant improvements in function in a reasonable and predictable amount of time.  Frequency and Duration min 2x/week  2 weeks       Prognosis Barriers to Reach Goals: Severity of deficits      Swallow Study   General Date of Onset: 09/08/2022 HPI: Patient is a 67 y.o. male with PMH: OSA,  newly diagnosed diffuse large B-cell lymphoma who initially presented to [facility] medical oncology clinic with complaints of intermittent and progressive abdominal discomfort, generalized fatigue, night sweats, poor oral intake, unintentional weight loss of 30 pounds since February 2024. He presented to [facility] ED on 09/18/22 with c/o night sweats and worsening abdominal pain, nausea and lightheadedness. IV contrast CT scan of the abdomen and pelvis revealed the following findings: Enlarging mass at the hepatic flexure increased pericolonic fat stranding with loss of fat plane between the mass in the right lobe of the liver.  Increasing mesenteric lymphadenopathy and new soft tissue mass along the crus of the left diaphragm and enlarging bilateral adrenal masses consistent with progressive intra-abdominal metastases.  New and enlarging left lower lobe pulmonary nodules consistent with progressive metastatic pulmonary involvement.  On 10/14/22, he underwent ex-lap and remained intubated following surgery, started on TPN. He has NG for drainage. He was extubated 5/29. CT drain placed in IR on 6/3 and he became tachycardic and hypotensive following drain placement, was intubated and transferred back to ICU. On 6/6, GOC discussion with family and code status changed to DNR and patient was extubated. Type of Study: Bedside  Swallow Evaluation Previous Swallow Assessment: none found Diet Prior to this Study: NPO Temperature Spikes Noted: No Respiratory Status: Nasal cannula History of Recent Intubation: Yes Total duration of intubation (days): 6 days Date extubated: 10/03/22 Behavior/Cognition: Alert;Cooperative;Pleasant mood;Confused Oral Cavity Assessment: Dry Oral Care Completed by SLP: Yes Oral Cavity - Dentition: Adequate natural dentition Vision: Functional for self-feeding Self-Feeding Abilities: Able to feed self Patient Positioning: Upright in bed Baseline Vocal Quality: Normal Volitional Cough: Strong;Congested Volitional Swallow: Unable to elicit    Oral/Motor/Sensory Function Overall Oral Motor/Sensory Function: Within functional limits   Ice Chips Ice chips: Within functional limits Presentation: Self Fed;Spoon   Thin Liquid Thin Liquid: Impaired Presentation: Straw;Cup;Self Fed Pharyngeal  Phase Impairments: Suspected delayed Swallow    Nectar Thick     Honey Thick     Puree Puree: Not tested   Solid     Solid: Not tested      Angela Nevin, MA, CCC-SLP Speech Therapy

## 2022-10-07 NOTE — Hospital Course (Signed)
67yo male with h/o HLD, anxiety/depression, OSA, and recent diagnosis of large B-cell lymphoma who presented on 5/23 with progressive abdominal pain and FTT.  He was found to have an aggressive malignancy complicated by colon perforation with large abscess and suffered a severe SIRS response on 6/3 after IR placed a drain, requiring intubation and pressors.  He is being treated with Ceftriaxone.  He has not tolerated tube feeds and is on TPN.  Surgery and oncology are following.

## 2022-10-07 NOTE — Progress Notes (Signed)
PHARMACY - TOTAL PARENTERAL NUTRITION CONSULT NOTE   Indication: Prolonged ileus  Patient Measurements: Height: 5\' 8"  (172.7 cm) Weight: 93.1 kg (205 lb 4 oz) IBW/kg (Calculated) : 68.4 TPN AdjBW (KG): 81 Body mass index is 31.21 kg/m.  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  He was supposed to start chemo, but developed severe acute abdominal pain on 5/28.  NG tube placed, imaging to confirm placement shows possible free air and he was taken to OR on 5/28. Pharmacy consulted to dose TPN for prolonged ileus.    CT with increased fluid collection in right abdomen around surgical drain. IR placed drain 6/3. Patient later intubated and started on vasopressors. Patient with extravascular volume, given albumin 6/4. TPN concentrated 6/4 in attempt to reduce fluid burden. Patient remains intubated but with reduced vasopressor requirements on 6/5. Fever curve and WBC improved. Patient extubated 6/6 and off of vasopressors. Tube feeds started at trickle 6/6, but patient with episode of vomiting 6/7 am and tube feeds subsequently held.  So far patient has been unable to tolerate tube feeds secondary to N/V and NGT has been to suction.  Glucose / Insulin: No hx DM, no PTA medications. Tapering steroids. CBGs at goal < 150 and stable. Sensitive SSI: 2 units / 24 hrs Electrolytes: wnl exc K borderline low at 3.4, Cl elevated at 113, and bicarb elevated at 28. Phos wnl but down to 3.2. Renal: SCr low, BUN increasing slightly over last few days. Hepatic: AST/ALT, Tbili WNL. TG wnl. Albumin low at 1.9. Intake / Output: 24 hr - Drains 205 mL - Urine 1700 mL - ostomy: 1,050 mL - Noted weight gain over past couple weeks, is fluid net positive this admit. GI Imaging:  -6/2 CT abd/pelvis - large irregular fluid collection in the right abdomen lies in the location of  the previous seen large necrotic mass -5/28  Lucency in the right upper quadrant was not seen on the previous images. Free intraperitoneal air is not excluded.  6/9 Abd xray shows no obstructive gas pattern.= 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  - 6/3 IR drainage catheter placed into posterior pelvic fluid collection  Central access: Port (09/20/22), Triple lumen PICC placed 5/29 TPN start date: 5/29  Nutritional Goals: Vital 1.5 at 58ml/hr and Prosource TF20 60mL BID (provides 2320 kcal, 137g of protein)  RD Assessment: updated 6/7 Estimated Needs Total Energy Estimated Needs: 2250-2450 kcals Total Protein Estimated Needs: 120-140 grams Total Fluid Estimated Needs: >/= 2.2L  Current Nutrition:  TPN TFs remain on hold Prosource TF20 BID per tube (40g of protein and 160 kcal daily) NPO  Plan:  Continue full TPN but will decrease based on Prosource.  Change TPN to 22ml/hr this evening (provides 100g of protein and 2123 kcal per day) Continue Prosource TF20 BID per RD (40g of protein and 160 kcal per day) -Electrolytes in TPN: Na 25 mEq/L, K 70 mEq/L, Ca 3 mEq/L, Mg 5 mEq/L, and Phos 20 mmol/L. Cl:Ac 1:1 Add standard MVI and trace elements to TPN Change sensitive SSI to q6h and adjust as needed  Monitor TPN labs on Mon/Thurs, Bmet and Phos tomorrow F/U ability to tolerate TFs and wean off TPN  Enzo Bi, PharmD, BCPS, BCIDP Clinical Pharmacist 10/07/2022 8:28 AM

## 2022-10-07 NOTE — Evaluation (Signed)
Physical Therapy Re-Evaluation Patient Details Name: Justin Cameron MRN: [id number] DOB: 1955/11/28 Today's Date: 10/07/2022  History of Present Illness  67 y.o. male with a history of OSA, newly diagnosed metastatic DLBCL who [facility] for ongoing abdominal pain, bloating, unintentional weight loss, night sweats. He had been seen in the ED 5/22 where CT showed an enlarging mass at the hepatic flexure, increased pericolonic ft stranding, increased mesenteric lymphadenopathy, enlarging bilateral adrenal masses, and new mass along crus of left diaphragm.  On 09/17/2022, pt s/p Exploratory laparotomy with right hemicolectomy and end ileostomy.  On 09/25/22, pt s/p IVC filter placement  Clinical Impression  Pt admitted with above diagnosis. Mod assist for sidelying to sit, min assist of 2 to stand and pivot to recliner with a RW. Vital signs stable on 4L O2.  Pt currently with functional limitations due to the deficits listed below (see PT Problem List). Pt will benefit from acute skilled PT to increase their independence and safety with mobility to allow discharge.          Recommendations for follow up therapy are one component of a multi-disciplinary discharge planning process, led by the attending physician.  Recommendations may be updated based on patient status, additional functional criteria and insurance authorization.  Follow Up Recommendations Can patient physically be transported by private vehicle: No     Assistance Recommended at Discharge Intermittent Supervision/Assistance  Patient can return home with the following  A little help with walking and/or transfers;Assistance with cooking/housework;Help with stairs or ramp for entrance;A lot of help with bathing/dressing/bathroom;Assist for transportation    Equipment Recommendations Rolling walker (2 wheels)  Recommendations for Other Services       Functional Status Assessment Patient has had a  recent decline in their functional status and demonstrates the ability to make significant improvements in function in a reasonable and predictable amount of time.     Precautions / Restrictions Precautions Precaution Comments: multiple lines/leads, JP drain, ileostomy Restrictions Weight Bearing Restrictions: No      Mobility  Bed Mobility   Bed Mobility: Rolling, Sidelying to Sit, Sit to Sidelying Rolling: Min assist Sidelying to sit: Mod assist       General bed mobility comments: verbal and manual cues for log roll, assist to raise trunk    Transfers Overall transfer level: Needs assistance Equipment used: Rolling walker (2 wheels) Transfers: Sit to/from Stand, Bed to chair/wheelchair/BSC Sit to Stand: Min assist, +2 safety/equipment, +2 physical assistance   Step pivot transfers: Min guard, +2 physical assistance, +2 safety/equipment       General transfer comment: Verbal cues for hand position, min A to power up, VCs for safety; SpO2 94% on 4L O2 with activity    Ambulation/Gait                  Stairs            Wheelchair Mobility    Modified Rankin (Stroke Patients Only)       Balance Overall balance assessment: Needs assistance Sitting-balance support: No upper extremity supported, Feet supported Sitting balance-Leahy Scale: Fair     Standing balance support: Bilateral upper extremity supported, During functional activity, Reliant on assistive device for balance Standing balance-Leahy Scale: Poor                               Pertinent Vitals/Pain Pain Assessment Faces Pain Scale: Hurts little more  Pain Location: abdomen    Home Living                          Prior Function                       Hand Dominance        Extremity/Trunk Assessment   Upper Extremity Assessment Upper Extremity Assessment: Overall WFL for tasks assessed    Lower Extremity Assessment Lower Extremity  Assessment: Overall WFL for tasks assessed    Cervical / Trunk Assessment Cervical / Trunk Assessment: Normal  Communication      Cognition Arousal/Alertness: Awake/alert Behavior During Therapy: WFL for tasks assessed/performed Overall Cognitive Status: Impaired/Different from baseline Area of Impairment: Orientation, Memory                 Orientation Level: Time, Situation   Memory: Decreased short-term memory                  General Comments      Exercises General Exercises - Lower Extremity Ankle Circles/Pumps: AROM, Both, 10 reps, Supine   Assessment/Plan    PT Assessment Patient needs continued PT services  PT Problem List Decreased strength;Cardiopulmonary status limiting activity;Decreased activity tolerance;Decreased balance;Decreased mobility;Decreased knowledge of use of DME;Decreased knowledge of precautions       PT Treatment Interventions Gait training;DME instruction;Therapeutic exercise;Functional mobility training;Therapeutic activities;Patient/family education;Balance training    PT Goals (Current goals can be found in the Care Plan section)  Acute Rehab PT Goals Patient Stated Goal: to get stronger PT Goal Formulation: With patient Time For Goal Achievement: 10/10/22 Potential to Achieve Goals: Good    Frequency Min 1X/week     Co-evaluation               AM-PAC PT "6 Clicks" Mobility  Outcome Measure Help needed turning from your back to your side while in a flat bed without using bedrails?: A Little Help needed moving from lying on your back to sitting on the side of a flat bed without using bedrails?: A Lot Help needed moving to and from a bed to a chair (including a wheelchair)?: A Little Help needed standing up from a chair using your arms (e.g., wheelchair or bedside chair)?: A Lot Help needed to walk in hospital room?: Total Help needed climbing 3-5 steps with a railing? : Total 6 Click Score: 12    End of Session  Equipment Utilized During Treatment: Gait belt Activity Tolerance: Patient limited by fatigue Patient left: in chair;with call bell/phone within reach;with family/visitor present Nurse Communication: Mobility status PT Visit Diagnosis: Difficulty in walking, not elsewhere classified (R26.2)    Time: 2725-3664 PT Time Calculation (min) (ACUTE ONLY): 29 min   Charges:   PT Evaluation $PT Re-evaluation: 1 Re-eval PT Treatments $Therapeutic Activity: 8-22 mins       Ralene Bathe Kistler PT 10/07/2022  Acute Rehabilitation Services  Office [phone]

## 2022-10-07 NOTE — Progress Notes (Signed)
Assessment & Plan: POD#13 - s/p ex lap with right colectomy and end ileostomy - TC, 09/15/2022  - TNA, sips  - Rocephin  - dressing change by WOC - wound with partial dehiscence, minimal granulation, no fistula  - WBC 11.9, improved  - plan to repeat CT later this week; IR drain in place   FEN - TPN, sips/chips VTE - lovenox IVC filter ID - rocephin  B-cell lymphoma SIRS - s/p IR drain placement LLE DVT        [name], MD [facility] practice Office: [phone]        Chief Complaint: Colonic perforation secondary to B-cell lymphoma, chemo Rx  Subjective: Patient in bed, responsive; WOC at bedside changing midline wound  Objective: Vital signs in last 24 hours: Temp:  [97.7 F (36.5 C)-99.1 F (37.3 C)] 98.5 F (36.9 C) (06/10 0815) Pulse Rate:  [71-96] 96 (06/10 0600) Resp:  [16-25] 18 (06/10 0600) BP: (119-151)/(49-80) 145/52 (06/10 0600) SpO2:  [93 %-100 %] 96 % (06/10 0600) Weight:  [92.3 kg] 92.3 kg (06/10 0705) Last BM Date : 10/06/22  Intake/Output from previous day: 06/09 0701 - 06/10 0700 In: 2130.6 [I.V.:2038.3; IV Piggyback:92.3] Out: 2955 [Urine:1700; Drains:205; Stool:1050] Intake/Output this shift: No intake/output data recorded.  Physical Exam: HEENT - sclerae clear, mucous membranes moist Abdomen - soft, protuberant; midline wound with partial fascial dehiscence, small serous drainage   Lab Results:  Recent Labs    10/06/22 0305 10/07/22 0615  WBC 13.6* 11.9*  HGB 8.8* 9.2*  HCT 28.3* 30.4*  PLT 293 291   BMET Recent Labs    10/06/22 0305 10/07/22 0615  NA 147* 145  K 3.5 3.4*  CL 114* 113*  CO2 25 25  GLUCOSE 125* 103*  BUN 28* 28*  CREATININE 0.50* 0.57*  CALCIUM 8.1* 8.1*   PT/INR No results for input(s): "LABPROT", "INR" in the last 72 hours. Comprehensive Metabolic Panel:    Component Value Date/Time   NA 145 10/07/2022 0615   NA 147 (H) 10/06/2022 0305   K 3.4 (L) 10/07/2022  0615   K 3.5 10/06/2022 0305   CL 113 (H) 10/07/2022 0615   CL 114 (H) 10/06/2022 0305   CO2 25 10/07/2022 0615   CO2 25 10/06/2022 0305   BUN 28 (H) 10/07/2022 0615   BUN 28 (H) 10/06/2022 0305   CREATININE 0.57 (L) 10/07/2022 0615   CREATININE 0.50 (L) 10/06/2022 0305   GLUCOSE 103 (H) 10/07/2022 0615   GLUCOSE 125 (H) 10/06/2022 0305   CALCIUM 8.1 (L) 10/07/2022 0615   CALCIUM 8.1 (L) 10/06/2022 0305   AST 25 10/07/2022 0615   AST 18 10/03/2022 0702   ALT 19 10/07/2022 0615   ALT 13 10/03/2022 0702   ALKPHOS 116 10/07/2022 0615   ALKPHOS 122 10/03/2022 0702   BILITOT 1.1 10/07/2022 0615   BILITOT 0.8 10/03/2022 0702   PROT 5.0 (L) 10/07/2022 0615   PROT 5.2 (L) 10/03/2022 0702   ALBUMIN 1.9 (L) 10/07/2022 0615   ALBUMIN 2.1 (L) 10/03/2022 0702    Studies/Results: DG Abd 1 View  Result Date: 10/06/2022 CLINICAL DATA:  Inpatient, abdominal pain, small-bowel obstruction EXAM: ABDOMEN - 1 VIEW COMPARISON:  10/03/2022 abdominal radiograph FINDINGS: IVC filter again noted in the medial right abdomen. Transgluteal right pelvic pigtail catheter overlies the midline lower sacrum. Surgical drain overlies the right abdomen. Right lower quadrant ostomy. No disproportionately dilated small bowel loops. Decreased mild to moderate stool in the remnant  colon. No evidence of pneumatosis or pneumoperitoneum on this supine view. Marked lumbar spondylosis. IMPRESSION: Nonobstructive bowel gas pattern. Decreased mild to moderate stool in the remnant colon. Electronically Signed   By: Delbert Phenix M.D.   On: 10/06/2022 16:13      [name] 10/07/2022  Patient ID: Justin Cameron, male   DOB: [birth year], 67 y.o.   MRN: [id number]

## 2022-10-07 NOTE — Consult Note (Addendum)
WOC Nurse ostomy follow-up consult note Pouch changed; wife is not present and Pt is critically ill and did not watch the procedure or participate.  Stoma is red and viable, slightly above skin level, 1 1/2 inches, Red rash to abd previously noted has improved. Applied barrier ring and 2 piece pouch.  Emptied 50cc dark brown liquid stool. 2 extra sets of supplies in the drawer in the room, along with educational materials. Use Supplies: 2 pc 2 3/4" pouch/2" skin barrier rings [place] # 2/[place] # [phone]/[place] [phone]) Enrolled patient in [facility] [place] program: Yes, previously   WOC Nurse wound follow up Surgical team following for assessment and plan of care. Surgeon at the bedside to assess wound appearance.  Remains unchanged since previous notes. Vac was discontinued this weekend related to clogging. Re-applied Vac dressing to midline post-op full thickness wound.  85% red, 15% yellow, sutures visible to interior wound, mod amt yellow drainage. Pt was medicated for pain prior to the procedure and tolerated with mod amt discomfort.  Applied Mepitel, I piece white foam, I piece black foam to 75mm cont suction. WOC will plan to change again on Wed Thank-you,  Bryant Lipps MSN, RN, [place], [place], CNS [phone]

## 2022-10-07 NOTE — Progress Notes (Signed)
Chaplain met Justin Cameron's wife as well as his brother-in-law and sister-in-law in the waiting area.  They are in good spirits and trying to stay positive.  They are celebrating baby steps and his wife found comfort in a nurse telling her that this is not a sprint, it is a marathon.  They have a very supportive family and faith community.  She requested continued prayers.  Chaplain provided listening as well as emotional support.  [address] [place] Pager, [phone]

## 2022-10-07 NOTE — Progress Notes (Signed)
Progress Note   Patient: Justin Cameron ZOX:[id number] DOB: 1956/02/22 DOA: 09/27/2022     18 DOS: the patient was seen and examined on 10/07/2022   Brief hospital course: 67yo male with h/o HLD, anxiety/depression, OSA, and recent diagnosis of large B-cell lymphoma who presented on 5/23 with progressive abdominal pain and FTT.  He was found to have an aggressive malignancy complicated by colon perforation with large abscess and suffered a severe SIRS response on 6/3 after IR placed a drain, requiring intubation and pressors.  He is being treated with Ceftriaxone.  He has not tolerated tube feeds and is on TPN.  Surgery and oncology are following.    Assessment and Plan:  Septic shock secondary to RLQ E. Coli abscess with colon perforation in the setting of diffuse large B-cell lymphoma -Presented as direct admission from oncology due to diffuse abdominal pain with findings of progressive mass hepatic flexure, increased mesenteric lymphadenopathy, adrenal masses progressive intra-abdominal metastasis and new pulmonary nodules consistent with metastatic process.   -Oncology was initially consulted for initiation of chemotherapy given his lymphoma and patient was started on rituximab on 5/24.   -Patient's condition continued decline and general surgery was consulted on 5/28 and underwent exploratory laparotomy with right hemicolectomy/end ileostomy by Dr. Luisa Hart.   -Hospitalization was further complicated septic shock due to large intra-abdominal abscess postoperatively and IR drain was placed and patient was placed on broadened antibiotics.   -Culture from drain placement with moderate to E. coli. 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 Candise Che notified and asked to consult   Adrenal insufficiency -Reported adrenal insufficiency with adrenal lesions, cortisol level on 5/23 within normal limits.   -Started on IV hydrocortisone during his intensive care unit stay due to underlying septic shock. -Continue to slowly wean hydrocortisone, currently on 50 mg IV q12; decreased to 50 mg IV daily for 3 additional doses and discontinue after 6/11 dose   Acute hypoxic/hypercarbic respiratory failure in the setting of septic shock -Following IR drain placement, patient developed septic shock requiring intubation on 6/3  -Successfully extubated on 6/6. -Continue supplemental oxygen, maintain SpO2 greater than 92% -Currently on 4 L nasal cannula   At risk for tumor lysis syndrome History of gout -Medical oncology following  -Plan to resume allopurinol when able to safely use GI tract -Currently not tolerating tube feeds   DVT left lower extremity s/p IVC -Not a candidate for anticoagulation per oncology.   -IR was consulted and IVC filter placed 5/29   Atrial fibrillation with RVR -Etiology likely secondary to septic shock, hypovolemia.   -Initially treated with IV metoprolol followed by amiodarone - currently off medications -Monitor on telemetry in SDU   Anemia in the setting of critical illness -Transfused 2 unit PRBC 5/30 and 6/8 -Trend CBC daily   Severe protein calorie malnutrition Nutrition Status: Nutrition Problem: Severe Malnutrition Etiology: chronic illness (non-Hodgkin's lymphoma) Signs/Symptoms: mild fat depletion, mild muscle depletion, energy intake < or equal to 75% for > or equal to 1 month, percent weight loss (11% in less than 1 month) Percent weight  loss: 11 % (in  less than 1 month) Interventions: Refer to RD note for recommendations, TPN -- Dietitian following, on TPN, currently intolerant of tube feeds, continue to challenge daily -- Continue TPN, pharmacy consulted for management   Significant weakness/debility/deconditioning: -Continue to encourage aggressive therapy PT/OT     Subjective: He was confused this AM.  Speech would be seemingly normal (I am waiting for the nurse to come clean me up) and then would not be appropriate (I went to [place] last night and it was really cold when I got back).  He noted that he was hoping to go home today and asked if he will be able to travel and leave the country.  I called his wife.  She reports that "a lot has gone on."  They went in to place a drain, lots of fluid and bacteria.  Placing the drain stirred up the bacteria and caused septic shock.  He ended up on the ventilator, the ICU doctor reported that they need to see progression/stability.  He has improved, was previously not doing well at all. Near the end of the week, they planned to move to SDU.  He was supposed to have PT on Friday but he had a visit with his mother so it was deferred until today.  Awaiting swallow evaluation, gradually having ice chips.  +BS with output.  Wound vac had a clot on top, redid with plan to change vac, wet dressing until wound care comes today.  He had mild confusion last night, coherent during the day but was agitated last evening.  No prior delirium reported.  His wife acknowledges that they were admitted for lymphoma - ER 5/21, Dr. Candise Che on 5/23, had chemo the following day and did well, did great on Memorial Day but that night had excruciating pain, went in to the hospital and had surgery the following day.   The cancer is still present, hoping to start chemo again later this week.  She is looking forward to seeing Dr. Candise Che today.    Physical Exam: Vitals:   10/07/22 1100 10/07/22 1200 10/07/22 1239 10/07/22 1500  BP:  (!) 119/54 121/63    Pulse:  98    Resp: 19 (!) 22    Temp:   (!) 97.5 F (36.4 C) 97.7 F (36.5 C)  TempSrc:   Axillary Axillary  SpO2:  94%    Weight:      Height:       General:  Appears calm and comfortable and is in NAD, confused as above Eyes:  EOMI, normal lids, iris ENT:  grossly normal hearing, lips & tongue, mmm Neck:  no LAD, masses or thyromegaly Cardiovascular:  RRR, no m/r/g. No LE edema.  Respiratory:   CTA bilaterally with no wheezes/rales/rhonchi.  Normal respiratory effort. Abdomen:  soft, appropriately tender; midline abdominal wound vac in place with drains Skin:  no rash or induration seen on limited exam Musculoskeletal:  grossly normal tone BUE/BLE, good ROM, no bony abnormality Psychiatric:  grossly normal mood and affect, speech fluent but somewhat inappropriate, AOx3 Neurologic:  CN 2-12 grossly intact, moves all extremities in coordinated fashion   Radiological Exams on Admission: Independently reviewed - see discussion in A/P where applicable  CT HEAD W & WO CONTRAST ( )  Result Date: 10/07/2022 CLINICAL DATA:  Brain metastases suspected. Diffuse large cell lymphoma. EXAM: CT HEAD WITHOUT AND WITH CONTRAST TECHNIQUE: Contiguous axial images were obtained from the base of the skull through the vertex without  and with intravenous contrast. RADIATION DOSE REDUCTION: This exam was performed according to the departmental dose-optimization program which includes automated exposure control, adjustment of the mA and/or kV according to patient size and/or use of iterative reconstruction technique. CONTRAST:  75mL OMNIPAQUE IOHEXOL 300 MG/ML  SOLN COMPARISON:  None Available. FINDINGS: Brain: Leptomeningeal and gyral enhancement along the posterior right temporal lobe, extending into the inferior parietal and occipital lobes with subjacent hypoattenuation of the gray and white matter, suspicious for CNS involvement of lymphoma. No significant mass effect or midline  shift. No acute hemorrhage. No hydrocephalus or extra-axial collection. Vascular: No hyperdense vessel or unexpected calcification. Visible vessels are patent. Skull: No calvarial fracture or suspicious bone lesion. Skull base is unremarkable. Sinuses/Orbits: Unremarkable. Other: None. IMPRESSION: Leptomeningeal and gyral enhancement along the posterior right temporal lobe, extending into the inferior right parietal and occipital lobes with subjacent hypoattenuation of the gray and white matter, suspicious for CNS involvement of lymphoma. No significant mass effect or midline shift. Electronically Signed   By: Orvan Falconer M.D.   On: 10/07/2022 12:57   DG Abd 1 View  Result Date: 10/06/2022 CLINICAL DATA:  Inpatient, abdominal pain, small-bowel obstruction EXAM: ABDOMEN - 1 VIEW COMPARISON:  10/03/2022 abdominal radiograph FINDINGS: IVC filter again noted in the medial right abdomen. Transgluteal right pelvic pigtail catheter overlies the midline lower sacrum. Surgical drain overlies the right abdomen. Right lower quadrant ostomy. No disproportionately dilated small bowel loops. Decreased mild to moderate stool in the remnant colon. No evidence of pneumatosis or pneumoperitoneum on this supine view. Marked lumbar spondylosis. IMPRESSION: Nonobstructive bowel gas pattern. Decreased mild to moderate stool in the remnant colon. Electronically Signed   By: Delbert Phenix M.D.   On: 10/06/2022 16:13    EKG: none since 5/31   Labs on Admission: I have personally reviewed the available labs and imaging studies at the time of the admission.  Pertinent labs:    WBC 11.9 Hgb 9.2  Family Communication: None present; I spoke with his wife by telephone at the time of evaluation   Total critical care time: 55 minutes Critical care time was exclusive of separately billable procedures and treating other patients. Critical care was necessary to treat or prevent imminent or life-threatening  deterioration. Critical care was time spent personally by me on the following activities: development of treatment plan with patient and/or surrogate as well as nursing, discussions with consultants, evaluation of patient's response to treatment, examination of patient, obtaining history from patient or surrogate, ordering and performing treatments and interventions, ordering and review of laboratory studies, ordering and review of radiographic studies, pulse oximetry and re-evaluation of patient's condition.   Disposition: Status is: Inpatient Remains inpatient appropriate because: critically ill  Planned Discharge Destination:  to be determined      Author: Jonah Blue, MD 10/07/2022 4:34 PM  For on call review [email or web address].

## 2022-10-08 ENCOUNTER — Inpatient Hospital Stay (HOSPITAL_COMMUNITY): Payer: Medicare Other

## 2022-10-08 DIAGNOSIS — R9089 Other abnormal findings on diagnostic imaging of central nervous system: Secondary | ICD-10-CM

## 2022-10-08 DIAGNOSIS — C8330 Diffuse large B-cell lymphoma, unspecified site: Secondary | ICD-10-CM | POA: Diagnosis not present

## 2022-10-08 DIAGNOSIS — Z7189 Other specified counseling: Secondary | ICD-10-CM

## 2022-10-08 LAB — GLUCOSE, CAPILLARY
Glucose-Capillary: 103 mg/dL — ABNORMAL HIGH (ref 70–99)
Glucose-Capillary: 106 mg/dL — ABNORMAL HIGH (ref 70–99)
Glucose-Capillary: 114 mg/dL — ABNORMAL HIGH (ref 70–99)
Glucose-Capillary: 89 mg/dL (ref 70–99)
Glucose-Capillary: 92 mg/dL (ref 70–99)

## 2022-10-08 LAB — CBC WITH DIFFERENTIAL/PLATELET
Abs Immature Granulocytes: 0.37 10*3/uL — ABNORMAL HIGH (ref 0.00–0.07)
Basophils Absolute: 0 10*3/uL (ref 0.0–0.1)
Basophils Relative: 0 %
Eosinophils Absolute: 0.2 10*3/uL (ref 0.0–0.5)
Eosinophils Relative: 2 %
HCT: 28.2 % — ABNORMAL LOW (ref 39.0–52.0)
Hemoglobin: 8.7 g/dL — ABNORMAL LOW (ref 13.0–17.0)
Immature Granulocytes: 4 %
Lymphocytes Relative: 6 %
Lymphs Abs: 0.6 10*3/uL — ABNORMAL LOW (ref 0.7–4.0)
MCH: 26.3 pg (ref 26.0–34.0)
MCHC: 30.9 g/dL (ref 30.0–36.0)
MCV: 85.2 fL (ref 80.0–100.0)
Monocytes Absolute: 0.5 10*3/uL (ref 0.1–1.0)
Monocytes Relative: 5 %
Neutro Abs: 8.8 10*3/uL — ABNORMAL HIGH (ref 1.7–7.7)
Neutrophils Relative %: 83 %
Platelets: 257 10*3/uL (ref 150–400)
RBC: 3.31 MIL/uL — ABNORMAL LOW (ref 4.22–5.81)
RDW: 18.8 % — ABNORMAL HIGH (ref 11.5–15.5)
WBC: 10.5 10*3/uL (ref 4.0–10.5)
nRBC: 0 % (ref 0.0–0.2)

## 2022-10-08 LAB — BASIC METABOLIC PANEL
Anion gap: 8 (ref 5–15)
BUN: 30 mg/dL — ABNORMAL HIGH (ref 8–23)
CO2: 24 mmol/L (ref 22–32)
Calcium: 8 mg/dL — ABNORMAL LOW (ref 8.9–10.3)
Chloride: 112 mmol/L — ABNORMAL HIGH (ref 98–111)
Creatinine, Ser: 0.51 mg/dL — ABNORMAL LOW (ref 0.61–1.24)
GFR, Estimated: >60 mL/min (ref >60)
Glucose, Bld: 104 mg/dL — ABNORMAL HIGH (ref 70–99)
Potassium: 3 mmol/L — ABNORMAL LOW (ref 3.5–5.1)
Sodium: 144 mmol/L (ref 135–145)

## 2022-10-08 LAB — PHOSPHORUS: Phosphorus: 3.4 mg/dL (ref 2.5–4.6)

## 2022-10-08 MED ORDER — GADOBUTROL 1 MMOL/ML IV SOLN
9.0000 mL | Freq: Once | INTRAVENOUS | Status: AC | PRN
  Administered 2022-10-08: 9 mL via INTRAVENOUS

## 2022-10-08 MED ORDER — POTASSIUM CHLORIDE 20 MEQ PO PACK
40.0000 meq | PACK | Freq: Once | ORAL | Status: AC
  Administered 2022-10-08: 40 meq via ORAL
  Filled 2022-10-08: qty 2

## 2022-10-08 MED ORDER — TRAVASOL 10 % IV SOLN: INTRAVENOUS | Status: DC

## 2022-10-08 MED ORDER — POTASSIUM CHLORIDE 10 MEQ/50ML IV SOLN
10.0000 meq | INTRAVENOUS | Status: AC
  Administered 2022-10-08 (×3): 10 meq via INTRAVENOUS
  Filled 2022-10-08 (×3): qty 50

## 2022-10-08 MED ORDER — DEXTROSE 10 % IV SOLN: INTRAVENOUS | Status: DC

## 2022-10-08 MED ORDER — HEPARIN SOD (PORK) LOCK FLUSH 100 UNIT/ML IV SOLN
500.0000 [IU] | INTRAVENOUS | Status: AC | PRN
  Administered 2022-10-08: 500 [IU]

## 2022-10-08 MED ORDER — TRAVASOL 10 % IV SOLN
INTRAVENOUS | Status: AC
  Filled 2022-10-08: qty 1320

## 2022-10-08 MED ORDER — ALLOPURINOL 100 MG PO TABS
100.0000 mg | ORAL_TABLET | Freq: Two times a day (BID) | ORAL | Status: DC
  Administered 2022-10-08 – 2022-10-11 (×6): 100 mg via ORAL
  Filled 2022-10-08 (×7): qty 1

## 2022-10-08 NOTE — Progress Notes (Signed)
Progress Note   Patient: Justin Cameron ZOX:[id number] DOB: 1956-01-31 DOA: 09/22/2022     19 DOS: the patient was seen and examined on 10/08/2022   Brief hospital course: 67yo male with h/o HLD, anxiety/depression, OSA, and recent diagnosis of large B-cell lymphoma who presented on 5/23 with progressive abdominal pain and FTT.  He was found to have an aggressive malignancy complicated by colon perforation with large abscess and suffered a severe SIRS response on 6/3 after IR placed a drain, requiring intubation and pressors.  He is being treated with Ceftriaxone.  He has not tolerated tube feeds and is on TPN.  Surgery and oncology are following.    Assessment and Plan:   Septic shock secondary to RLQ E. Coli abscess with colon perforation in the setting of diffuse large B-cell lymphoma -Presented as direct admission from oncology due to diffuse abdominal pain with findings of progressive mass hepatic flexure, increased mesenteric lymphadenopathy, adrenal masses progressive intra-abdominal metastasis and new pulmonary nodules consistent with metastatic process.   -Oncology was initially consulted for initiation of chemotherapy given his lymphoma and patient was started on rituximab on 5/24.   -Patient's condition continued decline and general surgery was consulted on 5/28 and underwent exploratory laparotomy with right hemicolectomy/end ileostomy by Dr. Luisa Hart.   -Hospitalization was further complicated septic shock due to large intra-abdominal abscess postoperatively and IR drain was placed and patient was placed on broadened antibiotics.   -Culture from drain placement with moderate E. coli. -General surgery, medical oncology following; appreciate assistance -Ceftriaxone 2 g IV every 24 hours -Continue JP drain, continue to monitor output; management per general surgery/IR -Continue local wound care, wound VAC to abdominal surgical incision was discontinued on 6/11 due to persistent clot  formation causing machine malfunction -Not a candidate for restarting chemotherapy until wound heals per medical oncology (at least 2-3 weeks) -Providing some sips and chips, able to clamp NG tube -Will defer to surgery, but may be able to progress diet to clears and eventually pull NG tube -Would consider palliative care consultation   Delirium -Patient with apparently acute onset of delirium with agitation 6/9 and delusions 6/10 -Imaging performed and 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 Candise Che notified and asked to consult -Current plan is for MRI brain, LP for further evaluation, and intrathecal methotrexate for treatment/prevention   Adrenal insufficiency -Reported adrenal insufficiency with adrenal lesions, cortisol level on 5/23 within normal limits.   -Started on IV hydrocortisone during his intensive care unit stay due to underlying septic shock. -Continue to slowly wean hydrocortisone, currently on 50 mg IV q12; decreased to 50 mg IV daily for 3 additional doses and discontinue after 6/11 dose   Acute hypoxic/hypercarbic respiratory failure in the setting of septic shock -Following IR drain placement, patient developed septic shock requiring intubation on 6/3  -Successfully extubated on 6/6. -Continue supplemental oxygen, maintain SpO2 greater than 92% -Currently on 2 L nasal cannula   At risk for tumor lysis syndrome History of gout -Medical oncology following  -Plan to resume allopurinol when able to safely use GI tract -Currently not tolerating tube feeds   DVT left lower extremity s/p IVC -Not a candidate for anticoagulation per oncology.   -IR was consulted and IVC filter placed 5/29   Atrial fibrillation with RVR -Etiology likely secondary to septic shock, hypovolemia.   -Initially treated with IV metoprolol followed by amiodarone - currently off medications -Monitor on telemetry in SDU   Anemia in the setting of critical illness -Transfused  2 unit PRBC  5/30 and 6/8 -Trend CBC daily   Severe protein calorie malnutrition Nutrition Status: Nutrition Problem: Severe Malnutrition Etiology: chronic illness (non-Hodgkin's lymphoma) Signs/Symptoms: mild fat depletion, mild muscle depletion, energy intake < or equal to 75% for > or equal to 1 month, percent weight loss (11% in less than 1 month) Percent weight loss: 11 % (in less than 1 month) Interventions: Refer to RD note for recommendations, TPN -- Dietitian following, on TPN, currently intolerant of tube feeds, continue to challenge daily -- Continue TPN, pharmacy consulted for management   Significant weakness/debility/deconditioning: -Continue to encourage aggressive therapy PT/OT    Subjective: He looks and feels some better today.  He is not confused/delusional.  His wound vac has been failing and so has been removed.  He was able to tolerate sips and chips yesterday, currently NPO for planned LP and MRI today.  Physical Exam: Vitals:   10/08/22 0400 10/08/22 0421 10/08/22 0500 10/08/22 0600  BP: (!) 117/50  (!) 129/51 (!) 125/48  Pulse: 84  81 79  Resp: 16  14 15   Temp:  97.8 F (36.6 C)    TempSrc:  Oral    SpO2: 98%  97% 98%  Weight:   87.8 kg   Height:       General:  Appears chronically ill but more perky today Eyes:  EOMI, normal lids, iris ENT:  grossly normal hearing, dry lips & tongue, NG tube in place Neck:  no LAD, masses or thyromegaly Cardiovascular:  RRR, no m/r/g. No LE edema.  Respiratory:   CTA bilaterally with no wheezes/rales/rhonchi.  Normal respiratory effort. Abdomen:  diffuse dressings, drains, ostomy Skin:  no rash or induration seen on limited exam Musculoskeletal:  grossly normal tone BUE/BLE, good ROM, no bony abnormality Psychiatric:  blunted mood and affect, speech fluent and appropriate, AOx3 Neurologic:  CN 2-12 grossly intact, moves all extremities in coordinated fashion  Radiological Exams on Admission: Independently reviewed - see  discussion in A/P where applicable  CT HEAD W & WO CONTRAST ( )  Result Date: 10/07/2022 CLINICAL DATA:  Brain metastases suspected. Diffuse large cell lymphoma. EXAM: CT HEAD WITHOUT AND WITH CONTRAST TECHNIQUE: Contiguous axial images were obtained from the base of the skull through the vertex without and with intravenous contrast. RADIATION DOSE REDUCTION: This exam was performed according to the departmental dose-optimization program which includes automated exposure control, adjustment of the mA and/or kV according to patient size and/or use of iterative reconstruction technique. CONTRAST:  75mL OMNIPAQUE IOHEXOL 300 MG/ML  SOLN COMPARISON:  None Available. FINDINGS: Brain: Leptomeningeal and gyral enhancement along the posterior right temporal lobe, extending into the inferior parietal and occipital lobes with subjacent hypoattenuation of the gray and white matter, suspicious for CNS involvement of lymphoma. No significant mass effect or midline shift. No acute hemorrhage. No hydrocephalus or extra-axial collection. Vascular: No hyperdense vessel or unexpected calcification. Visible vessels are patent. Skull: No calvarial fracture or suspicious bone lesion. Skull base is unremarkable. Sinuses/Orbits: Unremarkable. Other: None. IMPRESSION: Leptomeningeal and gyral enhancement along the posterior right temporal lobe, extending into the inferior right parietal and occipital lobes with subjacent hypoattenuation of the gray and white matter, suspicious for CNS involvement of lymphoma. No significant mass effect or midline shift. Electronically Signed   By: Orvan Falconer M.D.   On: 10/07/2022 12:57   DG Abd 1 View  Result Date: 10/06/2022 CLINICAL DATA:  Inpatient, abdominal pain, small-bowel obstruction EXAM: ABDOMEN - 1 VIEW COMPARISON:  10/03/2022 abdominal  radiograph FINDINGS: IVC filter again noted in the medial right abdomen. Transgluteal right pelvic pigtail catheter overlies the midline lower  sacrum. Surgical drain overlies the right abdomen. Right lower quadrant ostomy. No disproportionately dilated small bowel loops. Decreased mild to moderate stool in the remnant colon. No evidence of pneumatosis or pneumoperitoneum on this supine view. Marked lumbar spondylosis. IMPRESSION: Nonobstructive bowel gas pattern. Decreased mild to moderate stool in the remnant colon. Electronically Signed   By: Delbert Phenix M.D.   On: 10/06/2022 16:13    EKG: last on 5/31   Labs on Admission: I have personally reviewed the available labs and imaging studies at the time of the admission.  Pertinent labs:    K+ 3.0 Glucose 104 WBC 10.5 Hgb 8.7, 9.2 on 6/10  Family Communication: Wife was present throughout evaluation  Disposition: Status is: Inpatient Remains inpatient appropriate because: critically ill  Planned Discharge Destination:  TBD    Total critical care time: 40 minutes Critical care time was exclusive of separately billable procedures and treating other patients. Critical care was necessary to treat or prevent imminent or life-threatening deterioration. Critical care was time spent personally by me on the following activities: development of treatment plan with patient and/or surrogate as well as nursing, discussions with consultants, evaluation of patient's response to treatment, examination of patient, obtaining history from patient or surrogate, ordering and performing treatments and interventions, ordering and review of laboratory studies, ordering and review of radiographic studies, pulse oximetry and re-evaluation of patient's condition.   Author: Jonah Blue, MD 10/08/2022 7:10 AM  For on call review [email or web address].

## 2022-10-08 NOTE — Consult Note (Addendum)
WOC Nurse Consult Note: Reason for Consult: Consult requested for Vac which is alarming.  Cannister is full with 400cc dark bloody drainage.  Notified surgical team and arranged to meet to remove Vac and assess wound. Mod amt brown-red drainage, sutures in place to inner wound; 17X8X4cm.  Applied Mepitel and moist gauze dressing and Vac discontinued. Refer to surgical team notes for further description and plan of care. Ostomy leaking large amt green liquid. Stoma is red and viable, 1 1/4 inches, slightly above skin level.  Applied barrier ring and one piece convex Gigi Gin # [id number].   WOC team will continue to follow. Wife at bedside to discuss plan of care.  Thank-you,  Cammie Mcgee MSN, RN, CWOCN, [place], CNS [phone]

## 2022-10-08 NOTE — Progress Notes (Signed)
14 Days Post-Op   Subjective/Chief Complaint: MRI results reviewed from yesterday.  Confusion seems improved today.  VAC alarming and significant old bloody output.  Nausea seems improved.  Wanting an icee.   Objective: Vital signs in last 24 hours: Temp:  [97.5 F (36.4 C)-99.2 F (37.3 C)] 97.8 F (36.6 C) (06/11 0421) Pulse Rate:  [79-99] 79 (06/11 0600) Resp:  [14-25] 15 (06/11 0600) BP: (112-133)/(45-73) 125/48 (06/11 0600) SpO2:  [88 %-100 %] 98 % (06/11 0600) Weight:  [87.8 kg] 87.8 kg (06/11 0500) Last BM Date : 10/06/22  Intake/Output from previous day: 06/10 0701 - 06/11 0700 In: 2264.1 [I.V.:1939.1; NG/GT:210; IV Piggyback:100] Out: 3405 [Urine:2000; Drains:605; Stool:800] Intake/Output this shift: No intake/output data recorded.    Lab Results:  Ab approp tender, surgical drain with old hematoma like output.  IR drain with serous output.  Midline wound with significant dehiscence.  No enteric leakage noted.  Vac removed due to alarming from clogging from old bloody drainage.    BMET Recent Labs    10/07/22 0615 10/08/22 0212  NA 145 144  K 3.4* 3.0*  CL 113* 112*  CO2 25 24  GLUCOSE 103* 104*  BUN 28* 30*  CREATININE 0.57* 0.51*  CALCIUM 8.1* 8.0*   PT/INR No results for input(s): "LABPROT", "INR" in the last 72 hours. ABG No results for input(s): "PHART", "HCO3" in the last 72 hours.  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 EXAM: CT HEAD WITHOUT AND WITH CONTRAST TECHNIQUE: Contiguous axial images were obtained from the base of the skull through the vertex without and with intravenous contrast. RADIATION DOSE REDUCTION: This exam was performed according to the departmental dose-optimization program which includes automated exposure control, adjustment of the mA and/or kV according to patient size and/or use of  iterative reconstruction technique. CONTRAST:  75mL OMNIPAQUE IOHEXOL 300 MG/ML  SOLN COMPARISON:  None Available. FINDINGS: Brain: Leptomeningeal and gyral enhancement along the posterior right temporal lobe, extending into the inferior parietal and occipital lobes with subjacent hypoattenuation of the gray and white matter, suspicious for CNS involvement of lymphoma. No significant mass effect or midline shift. No acute hemorrhage. No hydrocephalus or extra-axial collection. Vascular: No hyperdense vessel or unexpected calcification. Visible vessels are patent. Skull: No calvarial fracture or suspicious bone lesion. Skull base is unremarkable. Sinuses/Orbits: Unremarkable. Other: None. IMPRESSION: Leptomeningeal and gyral enhancement along the posterior right temporal lobe, extending into the inferior right parietal and occipital lobes with subjacent hypoattenuation of the gray and white matter, suspicious for CNS involvement of lymphoma. No significant mass effect or midline shift. Electronically Signed   By: Orvan Falconer M.D.   On: 10/07/2022 12:57   DG Abd 1 View  Result Date: 10/06/2022 CLINICAL DATA:  Inpatient, abdominal pain, small-bowel obstruction EXAM: ABDOMEN - 1 VIEW COMPARISON:  10/03/2022 abdominal radiograph FINDINGS: IVC filter again noted in the medial right abdomen. Transgluteal right pelvic pigtail catheter overlies the midline lower sacrum. Surgical drain overlies the right abdomen. Right lower quadrant ostomy. No disproportionately dilated small bowel loops. Decreased mild to moderate stool in the remnant colon. No evidence of pneumatosis or pneumoperitoneum on this supine view. Marked lumbar spondylosis. IMPRESSION: Nonobstructive bowel gas pattern. Decreased mild to moderate stool in the remnant colon. Electronically Signed   By: Delbert Phenix M.D.   On: 10/06/2022 16:13    Anti-infectives: Anti-infectives (From admission, onward)  Start     Dose/Rate Route Frequency Ordered Stop    10/03/22 1400  cefTRIAXone (ROCEPHIN) 2 g in sodium chloride 0.9 % 100 mL IVPB        2 g 200 mL/hr over 30 Minutes Intravenous Every 24 hours 10/03/22 1151     10/01/22 2000  vancomycin (VANCOREADY) IVPB 1750 mg/350 mL  Status:  Discontinued        1,750 mg 175 mL/hr over 120 Minutes Intravenous Every 24 hours 09/30/22 1748 10/03/22 0851   10/01/22 1945  meropenem (MERREM) 1 g in sodium chloride 0.9 % 100 mL IVPB  Status:  Discontinued        1 g 200 mL/hr over 30 Minutes Intravenous Every 8 hours 10/01/22 1851 10/03/22 1151   09/30/22 1845  vancomycin (VANCOREADY) IVPB 2000 mg/400 mL        2,000 mg 200 mL/hr over 120 Minutes Intravenous  Once 09/30/22 1748 09/30/22 2317   09/30/22 0800  piperacillin-tazobactam (ZOSYN) IVPB 3.375 g  Status:  Discontinued        3.375 g 12.5 mL/hr over 240 Minutes Intravenous Every 8 hours 09/30/22 0701 10/01/22 1827   09/20/22 1402  ceFAZolin (ANCEF) IVPB 2g/100 mL premix        over 30 Minutes Intravenous Continuous PRN 09/20/22 1411 09/20/22 1402   09/20/22 0600  piperacillin-tazobactam (ZOSYN) IVPB 3.375 g  Status:  Discontinued        3.375 g 12.5 mL/hr over 240 Minutes Intravenous Every 8 hours 2022-09-22 1943 09/28/22 1134   Sep 22, 2022 1800  amoxicillin-clavulanate (AUGMENTIN) 875-125 MG per tablet 1 tablet  Status:  Discontinued        1 tablet Oral Every 12 hours Sep 22, 2022 1659 Sep 22, 2022 1939       Assessment/Plan: POD 14 s/p ex lap with right colectomy and end ileostomy- TC B cell lymphoma -can do CLD after his LP today. -SIRS after IR drain placement into pelvic collection.  Much better, extubated and off pressors -continue abx -continue wound care- DC VAC due to clogging.  Mepitel to base, WD dressing BID, and abdominal binder at all times -repeat imaging given IR drain this week.  They are following for timing.   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

## 2022-10-08 NOTE — Progress Notes (Addendum)
PHARMACY - TOTAL PARENTERAL NUTRITION CONSULT NOTE   Indication: Prolonged ileus  Patient Measurements: Height: 5\' 8"  (172.7 cm) Weight: 87.8 kg (193 lb 9 oz) IBW/kg (Calculated) : 68.4 TPN AdjBW (KG): 81 Body mass index is 29.43 kg/m.  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  He was supposed to start chemo, but developed severe acute abdominal pain on 5/28.  NG tube placed, imaging to confirm placement shows possible free air and he was taken to OR on 5/28. Pharmacy consulted to dose TPN for prolonged ileus.    CT with increased fluid collection in right abdomen around surgical drain. IR placed drain 6/3. Patient later intubated and started on vasopressors. Patient with extravascular volume, given albumin 6/4. TPN concentrated 6/4 in attempt to reduce fluid burden. Patient remains intubated but with reduced vasopressor requirements on 6/5. Fever curve and WBC improved. Patient extubated 6/6 and off of vasopressors. Tube feeds started at trickle 6/6, but patient with episode of vomiting 6/7 am and tube feeds subsequently held.   Glucose / Insulin: No hx DM, no PTA medications. Tapering steroids. CBGs at goal < 150 and stable. Sensitive SSI: 2 units / 24 hrs Electrolytes: K (3) is low. All other electrolytes WNL including CorrCa (9.7) -Cl remains slightly above normal,bicarb WNL Renal: SCr low, BUN increasing slightly over last few days. Hepatic: AST/ALT, Tbili WNL. TG wnl. Albumin low at 1.9. Intake / Output: 24 hr - Drains 605 mL - Urine 2000 mL - ostomy: 800 mL GI Imaging:  -6/2 CT abd/pelvis - large irregular fluid collection in the right abdomen lies in the location of the previous seen large necrotic mass -5/28  Lucency in the right upper quadrant was not seen on the previous images. Free intraperitoneal air is not excluded.  6/9 Abd xray  shows no obstructive gas pattern.= 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  - 6/3 IR drainage catheter placed into posterior pelvic fluid collection  Central access: Port (09/20/22), Triple lumen PICC placed 5/29 TPN start date: 5/29  Nutritional Goals: Vital 1.5 at 9ml/hr and Prosource TF20 60mL BID (provides 2320 kcal, 137g of protein)  RD Assessment: updated 6/7 Estimated Needs Total Energy Estimated Needs: 2250-2450 kcals Total Protein Estimated Needs: 120-140 grams Total Fluid Estimated Needs: >/= 2.2L  Current Nutrition:  TPN TFs remain on hold Prosource TF20 BID per tube (40g of protein and 160 kcal daily) NPO  Plan:  Continue full TPN but will decrease based on Prosource.  Now: KCl 40 mEq per tube given this morning. Order additional 10 mEq x 3 runs IV  At 1800: Continue TPN at 80 mL/hr this evening  provides 100g of protein and 2123 kcal per day Electrolytes in TPN: Increase K Na 25 mEq/L, K 75 mEq/L, Ca 3 mEq/L, Mg 5 mEq/L, and Phos 20 mmol/L.  Cl:Ac 1:2 Add standard MVI and trace elements to TPN Continue sensitive SSI q6h and adjust as needed  Monitor TPN labs on Mon/Thurs, recheck Bmet tomorrow F/U ability to tolerate TFs and wean off TPN  Cindi Carbon, PharmD, BCPS 10/08/22 7:31 AM  Addendum: Prosource discontinued. TF remain on hold. Will increase TPN goals back to meet 100% of needs.   TPN at 100 mL/hr will provide 132 g protein, 2318 kcal.   Concentration of electrolytes in TPN: Na 20 mEq/L, K  60 mEq/L, Ca 2 mEq/L, Mg 4 mEq/L, and Phos 16 mmol/L  Cindi Carbon, PharmD 10/08/22 10:30 AM

## 2022-10-08 NOTE — Progress Notes (Signed)
HEMATOLOGY/ONCOLOGY INPATIENT PROGRESS NOTE  Date of Service: 10/07/2022  Inpatient Attending: .Jonah Blue, MD   SUBJECTIVE Patient was seen in oncology follow-up with his wife at bedside and a few additional family members.  He looked more awake and was more verbal today with episodes of some confusion where he digresses and talks about HVAC systems.  These appear to be old memories since he did work with HVAC systems for a long time.  He is able to be redirected. He did have a CT of the head this morning which showed some concerns about CNS involvement by lymphoma.  OBJECTIVE:  NAD  PHYSICAL EXAMINATION: . Vitals:   10/08/22 0421 10/08/22 0500 10/08/22 0600 10/08/22 0800  BP:  (!) 129/51 (!) 125/48   Pulse:  81 79   Resp:  14 15   Temp: 97.8 F (36.6 C)   98.1 F (36.7 C)  TempSrc: Oral   Oral  SpO2:  97% 98%   Weight:  193 lb 9 oz (87.8 kg)    Height:       GENERAL: Sleepy but easily arousable. wife at bedside OROPHARYNX: NG tube in situ NECK: supple, no JVD LYMPH:  no palpable lymphadenopathy in the cervical, axillary or inguinal regions LUNGS: clear to auscultation b/l with normal respiratory effort HEART: regular rate & rhythm, mild tachy ABDOMEN: hypoactive bowel sounds Extremity: trace b/l pedal edema PSYCH: sleepy but easily arounsable NEURO: moving all 4 extremities   MEDICAL HISTORY:  Past Medical History:  Diagnosis Date   Arthritis    High cholesterol     SURGICAL HISTORY: Past Surgical History:  Procedure Laterality Date   IR IMAGING GUIDED PORT INSERTION  09/20/2022   IR IVC FILTER PLMT / S&I /IMG GUID/MOD SED  09/25/2022   LAPAROTOMY N/A 09/24/2022   Procedure: EXPLORATORY LAPAROTOMY, BOWEL RESECTION, ILEOSTOMY;  Surgeon: Harriette Bouillon, MD;  Location: [facility];  Service: General;  Laterality: N/A;   REPLACEMENT TOTAL KNEE  10/2018   ROTATOR CUFF REPAIR      SOCIAL HISTORY: Social History   Socioeconomic History   Marital status:  Married    Spouse name: Not on file   Number of children: Not on file   Years of education: Not on file   Highest education level: Not on file  Occupational History   Not on file  Tobacco Use   Smoking status: Never   Smokeless tobacco: Never  Vaping Use   Vaping Use: Never used  Substance and Sexual Activity   Alcohol use: Not Currently   Drug use: Not Currently   Sexual activity: Not on file  Other Topics Concern   Not on file  Social History Narrative   Not on file   Social Determinants of Health   Financial Resource Strain: Not on file  Food Insecurity: No Food Insecurity (09/23/2022)   Hunger Vital Sign    Worried About Running Out of Food in the Last Year: Never true    Ran Out of Food in the Last Year: Never true  Transportation Needs: No Transportation Needs (09/23/2022)   PRAPARE - Administrator, Civil Service (Medical): No    Lack of Transportation (Non-Medical): No  Physical Activity: Not on file  Stress: Not on file  Social Connections: Not on file  Intimate Partner Violence: Not At Risk (09/23/2022)   Humiliation, Afraid, Rape, and Kick questionnaire    Fear of Current or Ex-Partner: No    Emotionally Abused: No    Physically  Abused: No    Sexually Abused: No    FAMILY HISTORY: Family History  Problem Relation Age of Onset   Atrial fibrillation Mother    Alzheimer's disease Mother    Leukemia Father    Diabetes Father    Colon cancer Neg Hx    Rectal cancer Neg Hx    Esophageal cancer Neg Hx    Stomach cancer Neg Hx     ALLERGIES:  has no allergies on file.  MEDICATIONS:  Scheduled Meds:  allopurinol  100 mg Oral BID   Chlorhexidine Gluconate Cloth  6 each Topical Daily   docusate  100 mg Per Tube BID   insulin aspart  0-9 Units Subcutaneous Q6H   polyethylene glycol  17 g Per Tube Daily   sodium chloride flush  10-40 mL Intracatheter Q12H   sodium chloride flush  5 mL Intracatheter Q8H   Continuous Infusions:  sodium chloride  Stopped (10/02/22 2010)   sodium chloride Stopped (10/08/22 0943)   cefTRIAXone (ROCEPHIN)  IV Stopped (10/07/22 1359)   TPN ADULT (ION) 80 mL/hr at 10/08/22 1000   TPN ADULT (ION)     PRN Meds:.sodium chloride, sodium chloride, acetaminophen (TYLENOL) oral liquid 160 mg/5 mL, acetaminophen, diphenhydrAMINE, fentaNYL (SUBLIMAZE) injection, naphazoline-glycerin, ondansetron (ZOFRAN) IV, mouth rinse, prochlorperazine, sodium chloride flush  REVIEW OF SYSTEMS:   .10 Point review of Systems was done is negative except as noted above.  LABORATORY DATA:  I have reviewed the data as listed .    Latest Ref Rng & Units 10/07/2022    6:15 AM 10/06/2022    3:05 AM  CBC  WBC 4.0 - 10.5 K/uL 11.9  13.6   Hemoglobin 13.0 - 17.0 g/dL 9.2  8.8   Hematocrit 16.1 - 52.0 % 30.4  28.3   Platelets 150 - 400 K/uL 291  293    .    Latest Ref Rng & Units 10/07/2022    6:15 AM 10/06/2022    3:05 AM  CMP  Glucose 70 - 99 mg/dL 096  045   BUN 8 - 23 mg/dL 28  28   Creatinine 4.09 - 1.24 mg/dL 8.11  9.14   Sodium 782 - 145 mmol/L 145  147   Potassium 3.5 - 5.1 mmol/L 3.4  3.5   Chloride 98 - 111 mmol/L 113  114   CO2 22 - 32 mmol/L 25  25   Calcium 8.9 - 10.3 mg/dL 8.1  8.1   Total Protein 6.5 - 8.1 g/dL 5.0    Total Bilirubin 0.3 - 1.2 mg/dL 1.1    Alkaline Phos 38 - 126 U/L 116    AST 15 - 41 U/L 25    ALT 0 - 44 U/L 19     Component     Latest Ref Rng 09/19/2022 09/20/2022 09/21/2022  HCV Ab     NON REACTIVE  NON REACTIVE     Hepatitis B Surface Ag     NON REACTIVE  NON REACTIVE     Hep B Core Total Ab     NON REACTIVE  NON REACTIVE     Procalcitonin     ng/mL 0.22     Cortisol, Plasma     ug/dL 95.6     HIV Screen 4th Generation wRfx     Non Reactive   Non Reactive    Uric Acid, Serum     3.7 - 8.6 mg/dL   5.4     I have personally reviewed the radiological images  as listed and agreed with the findings in the report. 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 EXAM: CT HEAD WITHOUT AND WITH CONTRAST TECHNIQUE: Contiguous axial images were obtained from the base of the skull through the vertex without and with intravenous contrast. RADIATION DOSE REDUCTION: This exam was performed according to the departmental dose-optimization program which includes automated exposure control, adjustment of the mA and/or kV according to patient size and/or use of iterative reconstruction technique. CONTRAST:  75mL OMNIPAQUE IOHEXOL 300 MG/ML  SOLN COMPARISON:  None Available. FINDINGS: Brain: Leptomeningeal and gyral enhancement along the posterior right temporal lobe, extending into the inferior parietal and occipital lobes with subjacent hypoattenuation of the gray and white matter, suspicious for CNS involvement of lymphoma. No significant mass effect or midline shift. No acute hemorrhage. No hydrocephalus or extra-axial collection. Vascular: No hyperdense vessel or unexpected calcification. Visible vessels are patent. Skull: No calvarial fracture or suspicious bone lesion. Skull base is unremarkable. Sinuses/Orbits: Unremarkable. Other: None. IMPRESSION: Leptomeningeal and gyral enhancement along the posterior right temporal lobe, extending into the inferior right parietal and occipital lobes with subjacent hypoattenuation of the gray and white matter, suspicious for CNS involvement of lymphoma. No significant mass effect or midline shift. Electronically Signed   By: Orvan Falconer M.D.   On: 10/07/2022 12:57   DG Abd 1 View  Result Date: 10/06/2022 CLINICAL DATA:  Inpatient, abdominal pain, small-bowel obstruction EXAM: ABDOMEN - 1 VIEW COMPARISON:  10/03/2022 abdominal radiograph FINDINGS: IVC filter again noted in the medial right abdomen. Transgluteal right pelvic pigtail catheter overlies the midline lower sacrum. Surgical drain overlies the right abdomen. Right lower quadrant ostomy. No disproportionately  dilated small bowel loops. Decreased mild to moderate stool in the remnant colon. No evidence of pneumatosis or pneumoperitoneum on this supine view. Marked lumbar spondylosis. IMPRESSION: Nonobstructive bowel gas pattern. Decreased mild to moderate stool in the remnant colon. Electronically Signed   By: Delbert Phenix M.D.   On: 10/06/2022 16:13   DG Abd Portable 1V  Result Date: 10/03/2022 CLINICAL DATA:  NG tube placement. EXAM: PORTABLE ABDOMEN - 1 VIEW COMPARISON:  09/16/2022. FINDINGS: Nasogastric tube passes below the diaphragm, tip in the mid stomach. Normal bowel gas pattern. IMPRESSION: Well-positioned nasal/orogastric tube. Electronically Signed   By: Amie Portland M.D.   On: 10/03/2022 12:56   DG Chest Port 1 View  Result Date: 10/02/2022 CLINICAL DATA:  Dyspnea. EXAM: PORTABLE CHEST 1 VIEW COMPARISON:  September 30, 2022. FINDINGS: Stable cardiomediastinal silhouette. Endotracheal and nasogastric tubes are unchanged in position. Right internal jugular Port-A-Cath is unchanged. Right-sided PICC line is unchanged. Minimal bibasilar subsegmental atelectasis is noted. Bony thorax is unremarkable. IMPRESSION: Stable support apparatus. Minimal bibasilar subsegmental atelectasis. Electronically Signed   By: Lupita Raider M.D.   On: 10/02/2022 11:22   DG CHEST PORT 1 VIEW  Result Date: 09/30/2022 CLINICAL DATA:  OG tube and endotracheal tube placement. EXAM: PORTABLE CHEST 1 VIEW COMPARISON:  09/24/2022 FINDINGS: An endotracheal tube with tip 2.5 cm above the carina, RIGHT PICC line with tip difficult to visualize but appears to overlie the RIGHT atrium and a RIGHT Port-A-Cath with tip overlying the RIGHT atrium as well. An enteric tube is noted with tip overlying the mid stomach. Bibasilar opacities/atelectasis noted. There is no evidence of pneumothorax or large pleural effusion. IMPRESSION: 1. RIGHT PICC line placement but otherwise unchanged appearance of the chest. Other support apparatus  as described  with mild bibasilar opacities/atelectasis. Electronically Signed   By: Harmon Pier M.D.   On: 09/30/2022 17:26   CT GUIDED PERITONEAL/RETROPERITONEAL FLUID DRAIN BY PERC CATH  Result Date: 09/30/2022 INDICATION: [id number] Postprocedural intraabdominal abscess [id number] EXAM: CT-guided pelvic drain placement TECHNIQUE: Multidetector CT imaging of the abdomen and pelvis was performed following the standard protocol without IV contrast. RADIATION DOSE REDUCTION: This exam was performed according to the departmental dose-optimization program which includes automated exposure control, adjustment of the mA and/or kV according to patient size and/or use of iterative reconstruction technique. MEDICATIONS: The patient is currently admitted to the hospital and receiving intravenous antibiotics. The antibiotics were administered within an appropriate time frame prior to the initiation of the procedure. ANESTHESIA/SEDATION: Moderate (conscious) sedation was employed during this procedure. A total of Versed 2 mg and Fentanyl 100 mcg was administered intravenously by the radiology nurse. Total intra-service moderate Sedation Time: 19 minutes. The patient's level of consciousness and vital signs were monitored continuously by radiology nursing throughout the procedure under my direct supervision. COMPLICATIONS: None immediate. PROCEDURE: Informed written consent was obtained from the patient after a thorough discussion of the procedural risks, benefits and alternatives. All questions were addressed. Maximal Sterile Barrier Technique was utilized including caps, mask, sterile gowns, sterile gloves, sterile drape, hand hygiene and skin antiseptic. A timeout was performed prior to the initiation of the procedure. The patient was placed supine on the exam table. Limited CT of the abdomen and pelvis was performed for planning purposes. This demonstrated significant improvement of gas and fluid collection in the right lower quadrant. The  surgically placed drainage catheter courses through this collection. Also noted was unchanged appearance a sizable posterior pelvic collection. Given these findings, the decision was made to proceed with placement of a transgluteal pelvic drainage catheter. The patient was repositioned in the lateral decubitus position. Skin entry site was marked, and the overlying skin was prepped and draped in the standard sterile fashion. Local analgesia was obtained with 1% lidocaine. Using intermittent CT fluoroscopy, a 19 gauge Yueh catheter was advanced towards the identified posterior pelvic fluid collection using a right transgluteal approach. Location was confirmed with CT and return of purulent material. Over an Amplatz wire, the percutaneous tract was serially dilated to accommodate a 12 French locking drainage catheter. Location was again confirmed with CT and return of additional fluid. Locking loop was formed, the drainage catheter was secured to the skin using silk suture and a dressing. It was attached to bulb suction. The patient tolerated the procedure well without immediate complication. IMPRESSION: 1. CT imaging demonstrated significant improvement in the right lower quadrant collection with surgical drainage catheter in place. Given these findings, the decision was made to proceed with drainage of the unchanged posterior pelvic collection. 2. Successful CT-guided placement of a 12 French locking drainage catheter into the posterior pelvic collection via a right transgluteal approach. Drainage catheter placed to bulb suction. Electronically Signed   By: Olive Bass M.D.   On: 09/30/2022 16:21   CT ABDOMEN PELVIS W CONTRAST  Result Date: 09/29/2022 CLINICAL DATA:  Abdominal pain.  Postop. EXAM: CT ABDOMEN AND PELVIS WITH CONTRAST TECHNIQUE: Multidetector CT imaging of the abdomen and pelvis was performed using the standard protocol following bolus administration of intravenous contrast. RADIATION DOSE  REDUCTION: This exam was performed according to the departmental dose-optimization program which includes automated exposure control, adjustment of the mA and/or kV according to patient size and/or use of iterative reconstruction technique. CONTRAST:  80mL OMNIPAQUE IOHEXOL 300 MG/ML  SOLN COMPARISON:  09/18/2022. FINDINGS: Lower chest: Small right and trace left pleural effusions. Associated dependent lower lobe atelectasis, also greater on the right. Small focus of atelectasis at the base of the left upper lobe lingula. Hepatobiliary: Liver normal in size and attenuation. Multiple small low-attenuation liver masses consistent with cysts stable from the prior CT. Normal gallbladder. No bile duct dilation. Pancreas: Unremarkable. Spleen: Normal. Adrenals/Urinary Tract: Bilateral adrenal masses, unchanged from the recent prior CT, right 5.1 cm, 2 on the left 3.6 and 4.2 cm. Normal kidneys, ureters and bladder. Stomach/Bowel: Since the prior CT, abdominal surgery has been performed. There is a midline incision with the skin and subcutaneous fat open, fascia closed. Colon has been ligated at the level of the right mid transverse section. The ileum extends into a right anterior mid abdomen ostomy. There are postoperative collections. The largest is a heterogeneous right abdominal collection in the area of the necrotic mass. Soft tissue attenuation along some of the margins of this collection may reflect necrotic tissue or decompressed small bowel or a combination. The collection contains fluid and air. It measures approximately 23 cm from superior to inferior by 16 x 10 cm transversely. A drainage catheter lies within the mid to upper portion of this collection. Small subcapsular collection lies along the right liver margin. A subcapsular component of the larger collection lies along the anterior inferior aspect of the right liver lobe. Fluid is also seen distending the posterior pelvic recess. Remaining colon shows a  generalized mild increase in the colonic stool burden, but no wall thickening or convincing inflammation. There is no small bowel dilation to suggest obstruction. Stomach is moderately distended, but without wall thickening. Tiny bubbles of free air lie just deep to the upper abdominal incision line. Vascular/Lymphatic: Well-positioned vena cava filter. Aortic atherosclerosis. Prominent gastrohepatic ligament lymph nodes. Mesenteric nodes noted on the prior CT are less well-defined due to postoperative fluid/edema. Reproductive: Unremarkable. Other: None. Musculoskeletal: No fracture or acute finding. No osteoblastic or osteolytic lesions. IMPRESSION: 1. Status post abdominal surgery since the previous CT scan. Large irregular fluid collection in the right abdomen lies in the location of the previous seen large necrotic mass. Some of this collection may reflect the residual mass. Collection extends from the subcapsular inferior right liver to the right pelvis measuring 23 x 10 x 16 cm. Surgical drain lies within the upper to mid aspect of this collection. 2. 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 No evidence of bowel obstruction. 3. Findings of metastatic disease noted on the recent prior CT are unchanged. Electronically Signed   By: Amie Portland M.D.   On: 09/29/2022 16:02   VAS [place] LOWER EXTREMITY VENOUS (DVT)  Result Date: 09/25/2022  Lower Venous DVT Study Patient Name:  XSAVIOR AUPPERLE  Date of Exam:   09/25/2022 Medical Rec #: [id number]            Accession #:    [id number] Date of Birth: [birth year]             Patient Gender: M Patient Age:   67 years Exam Location:  [facility] Procedure:      VAS [place] LOWER EXTREMITY VENOUS (DVT) Referring Phys: Hosie Spangle --------------------------------------------------------------------------------  Indications: Edema.  Risk Factors: Chemotherapy Cancer Lymphoma. Comparison Study: No previous exams  Performing Technologist: Jody Hill RVT, RDMS  Examination Guidelines: A complete evaluation includes B-mode imaging, spectral Doppler, color Doppler, and  power Doppler as needed of all accessible portions of each vessel. Bilateral testing is considered an integral part of a complete examination. Limited examinations for reoccurring indications may be performed as noted. The reflux portion of the exam is performed with the patient in reverse Trendelenburg.  +---------+---------------+---------+-----------+----------+--------------+ RIGHT    CompressibilityPhasicitySpontaneityPropertiesThrombus Aging +---------+---------------+---------+-----------+----------+--------------+ CFV      Full           Yes      Yes                                 +---------+---------------+---------+-----------+----------+--------------+ SFJ      Full                                                        +---------+---------------+---------+-----------+----------+--------------+ FV Prox  Full           Yes      Yes                                 +---------+---------------+---------+-----------+----------+--------------+ FV Mid   Full           Yes      Yes                                 +---------+---------------+---------+-----------+----------+--------------+ FV DistalFull           Yes      Yes                                 +---------+---------------+---------+-----------+----------+--------------+ PFV      Full                                                        +---------+---------------+---------+-----------+----------+--------------+ POP      Full           Yes      Yes                                 +---------+---------------+---------+-----------+----------+--------------+ PTV      None           No       No                   Acute          +---------+---------------+---------+-----------+----------+--------------+ PERO     None           No       No                    Acute          +---------+---------------+---------+-----------+----------+--------------+   +---------+---------------+---------+-----------+----------+--------------+ LEFT     CompressibilityPhasicitySpontaneityPropertiesThrombus Aging +---------+---------------+---------+-----------+----------+--------------+ CFV      Full           Yes      Yes                                 +---------+---------------+---------+-----------+----------+--------------+  SFJ      Full                                                        +---------+---------------+---------+-----------+----------+--------------+ FV Prox  Full           Yes      Yes                                 +---------+---------------+---------+-----------+----------+--------------+ FV Mid   Full           Yes      Yes                                 +---------+---------------+---------+-----------+----------+--------------+ FV DistalFull           Yes      Yes                                 +---------+---------------+---------+-----------+----------+--------------+ PFV      Full                                                        +---------+---------------+---------+-----------+----------+--------------+ POP      Full           Yes      Yes                                 +---------+---------------+---------+-----------+----------+--------------+ PTV      Full                                                        +---------+---------------+---------+-----------+----------+--------------+ PERO     None           No       No                   Acute          +---------+---------------+---------+-----------+----------+--------------+     Summary: BILATERAL: -No evidence of popliteal cyst, bilaterally. RIGHT: - Findings consistent with acute deep vein thrombosis involving the right peroneal veins, and right posterior tibial veins.  LEFT: - Findings consistent with acute deep  vein thrombosis involving the left peroneal veins.  *See table(s) above for measurements and observations. Electronically signed by Coral Else MD on 09/25/2022 at 5:07:37 PM.    Final    IR IVC FILTER PLMT / S&I Lenise Arena GUID/MOD SED  Result Date: 09/25/2022 CLINICAL DATA:  67 year old male with history of diffuse large B-cell lymphoma and lower extremity deep vein thrombosis with contraindication to anticoagulation due to high risk of lymphoma hemorrhagic transformation. EXAM: 1. ULTRASOUND GUIDANCE FOR VASCULAR ACCESS OF THE RIGHT internal jugular VEIN. 2. IVC VENOGRAM. 3. PERCUTANEOUS IVC FILTER PLACEMENT. ANESTHESIA/SEDATION: Moderate (conscious)  sedation was employed during this procedure. A total of Versed 1 mg and Fentanyl 50 mcg was administered intravenously. Moderate Sedation Time: 12 minutes. The patient's level of consciousness and vital signs were monitored continuously by radiology nursing throughout the procedure under my direct supervision. CONTRAST:  30mL OMNIPAQUE IOHEXOL 300 MG/ML SOLN, 7mL OMNIPAQUE IOHEXOL 300 MG/ML SOLN FLUOROSCOPY TIME:  One hundred five mGy. PROCEDURE: The procedure, risks, benefits, and alternatives were explained to the patient. Questions regarding the procedure were encouraged and answered. The patient understands and consents to the procedure. The patient was prepped with Betadine in a sterile fashion, and a sterile drape was applied covering the operative field. A sterile gown and sterile gloves were used for the procedure. Local anesthesia was provided with 1% Lidocaine. Under direct ultrasound guidance, a 21 gauge needle was advanced into the right internal jugular vein with ultrasound image documentation performed. After securing access with a micropuncture dilator, a guidewire was advanced into the inferior vena cava. A deployment sheath was advanced over the guidewire. This was utilized to perform IVC venography. The deployment sheath was further positioned in an  appropriate location for filter deployment. A Denali IVC filter was then advanced in the sheath. This was then fully deployed in the infrarenal IVC. Final filter position was confirmed with a fluoroscopic spot image. Contrast injection was also performed through the sheath under fluoroscopy to confirm patency of the IVC at the level of the filter. After the procedure the sheath was removed and hemostasis obtained with manual compression. COMPLICATIONS: None. FINDINGS: IVC venography demonstrates a normal caliber IVC with no evidence of thrombus. Renal veins are identified bilaterally. The IVC filter was successfully positioned below the level of the renal veins and is appropriately oriented. This IVC filter has both permanent and retrievable indications. IMPRESSION: Placement of percutaneous IVC filter in infrarenal IVC. IVC venogram shows no evidence of IVC thrombus and normal caliber of the inferior vena cava. This filter does have both permanent and retrievable indications. PLAN: This IVC filter is potentially retrievable. The patient will be assessed for filter retrieval by Interventional Radiology in approximately 8-12 weeks. Further recommendations regarding filter retrieval, continued surveillance or declaration of device permanence, will be made at that time. Marliss Coots, MD [facility] Radiology Electronically Signed   By: Marliss Coots M.D.   On: 09/25/2022 15:20   ECHOCARDIOGRAM LIMITED  Result Date: 09/25/2022    ECHOCARDIOGRAM LIMITED REPORT   Patient Name:   RAIDEL HANEY Date of Exam: 09/25/2022 Medical Rec #:  [id number]           Height:       68.0 in Accession #:    [id number]          Weight:       178.8 lb Date of Birth:  10-01-1955            BSA:          1.949 m Patient Age:    66 years            BP:           94/56 mmHg Patient Gender: M                   HR:           100 bpm. Exam Location:  Inpatient Procedure: Limited Echo, Cardiac  Doppler, Color Doppler and Intracardiac            Opacification  Agent Indications:    Pulmonary embolis  History:        Patient has prior history of Echocardiogram examinations, most                 recent 09/20/2022.  Sonographer:    Lucy Antigua Referring Phys: [id number] Adam Phenix IMPRESSIONS  1. Left ventricular ejection fraction, by estimation, is 45 to 50%. The left ventricle has mildly decreased function. There is the interventricular septum is flattened in systole, consistent with right ventricular pressure overload.  2. Right ventricular systolic function is mildly reduced. The right ventricular size is mildly enlarged.  3. Mild mitral valve regurgitation.  4. The aortic valve is tricuspid. Aortic valve sclerosis is present, with no evidence of aortic valve stenosis. Comparison(s): Prior images reviewed side by side. Compared to recent prior study, LVEF is mildly decreased, with evidence of new RV strain. This study uses echo contrast. FINDINGS  Left Ventricle: Left ventricular ejection fraction, by estimation, is 45 to 50%. The left ventricle has mildly decreased function. Definity contrast agent was given IV to delineate the left ventricular endocardial borders. The interventricular septum is  flattened in systole, consistent with right ventricular pressure overload. Right Ventricle: The right ventricular size is mildly enlarged. Right ventricular systolic function is mildly reduced. Mitral Valve: Mild mitral valve regurgitation. Tricuspid Valve: Tricuspid valve regurgitation is mild. Aortic Valve: The aortic valve is tricuspid. Aortic valve sclerosis is present, with no evidence of aortic valve stenosis. Pulmonic Valve: The pulmonic valve was normal in structure. Pulmonic valve regurgitation is trivial. No evidence of pulmonic stenosis. Additional Comments: Spectral Doppler performed. Color Doppler performed.   LV Volumes (MOD) LV vol d, MOD A2C: 77.5 ml LV vol d, MOD A4C: 70.7 ml LV vol s, MOD  A2C: 44.8 ml LV vol s, MOD A4C: 37.7 ml LV SV MOD A2C:     32.7 ml LV SV MOD A4C:     70.7 ml LV SV MOD BP:      35.1 ml Riley Lam MD Electronically signed by Riley Lam MD Signature Date/Time: 09/25/2022/1:37:02 PM    Final    [place] EKG SITE RITE  Result Date: 09/25/2022 If Site Rite image not attached, placement could not be confirmed due to current cardiac rhythm.  DG Chest 1 View  Result Date: 09/24/2022 CLINICAL DATA:  Check endotracheal tube placement EXAM: PORTABLE CHEST 1 VIEW COMPARISON:  Film from earlier in the same day. FINDINGS: Gastric catheter is noted within the stomach. Endotracheal tube is noted in satisfactory position. Right chest wall port is seen and stable. Lungs are well aerated bilaterally. Minimal right effusion is seen. No acute bony abnormality is noted. IMPRESSION: Minimal right-sided effusion. Electronically Signed   By: Alcide Clever M.D.   On: 09/24/2022 20:25   DG CHEST PORT 1 VIEW  Result Date: 09/24/2022 CLINICAL DATA:  Evaluate gastric catheter placement EXAM: PORTABLE CHEST 1 VIEW COMPARISON:  09/18/2022 FINDINGS: Cardiac shadow is stable but accentuated by the portable technique. The overall inspiratory effort is poor with right basilar atelectasis. Right chest wall port is seen in satisfactory position. No pneumothorax is noted. Gastric catheter is noted with the tip superimposed over the left mainstem bronchus. This may still lie within the mid esophagus. This should be withdrawn and readvanced. IMPRESSION: Gastric catheter as described. This should be withdrawn and readvanced into the stomach. Electronically Signed   By: Alcide Clever M.D.   On: 09/24/2022 15:09   DG Abd Portable 1V  Result  Date: 09/24/2022 CLINICAL DATA:  Check gastric catheter placement EXAM: PORTABLE ABDOMEN - 1 VIEW COMPARISON:  Film from earlier in the same day. FINDINGS: Scattered large and small bowel gas is noted. Retained fecal material within the colon is seen. No free  air is noted. Gastric catheter is not visualized. Chest x-ray may be helpful for further evaluation. IMPRESSION: Gastric catheter is not visualized on this exam. Electronically Signed   By: Alcide Clever M.D.   On: 09/24/2022 15:07   DG Abd 1 View  Result Date: 09/24/2022 CLINICAL DATA:  Nasogastric placement EXAM: ABDOMEN - 1 VIEW COMPARISON:  Earlier same day FINDINGS: Nasogastric tube enters the stomach and coils in the fundus. Less intestinal gas than was noted on the prior image. Lucency in the right upper quadrant was not seen on the previous images. Free intraperitoneal air is not excluded. If there is clinical concern regarding this possibility, either left-side-down decubitus imaging or CT abdomen would be suggested. IMPRESSION: 1. Nasogastric tube enters the stomach and coils in the fundus. 2. Less intestinal gas than was noted on the prior image. 3. Lucency in the right upper quadrant was not seen on the previous images. Free intraperitoneal air is not excluded. See above discussion. Electronically Signed   By: Paulina Fusi M.D.   On: 09/24/2022 12:14   DG Abd Portable 1V  Result Date: 09/24/2022 CLINICAL DATA:  Abdominal pain. EXAM: PORTABLE ABDOMEN - 1 VIEW COMPARISON:  None Available. FINDINGS: Mildly dilated air-filled small bowel loops are noted concerning for ileus or possibly distal small bowel obstruction. No colonic dilatation is noted. No abnormal calcifications are noted. IMPRESSION: Mildly dilated air-filled small bowel loops are noted concerning for ileus or possibly distal small bowel obstruction. Electronically Signed   By: Lupita Raider M.D.   On: 09/24/2022 09:05   IR IMAGING GUIDED PORT INSERTION  Result Date: 09/20/2022 INDICATION: Colonic mass.  New diagnosis of lymphoma. EXAM: IMPLANTED PORT A CATH PLACEMENT WITH ULTRASOUND AND FLUOROSCOPIC GUIDANCE MEDICATIONS: Ancef 2 gm IV; The antibiotic was administered within an appropriate time interval prior to skin puncture.  ANESTHESIA/SEDATION: Moderate (conscious) sedation was employed during this procedure. A total of Versed 2 mg and Fentanyl 100 mcg was administered intravenously. Moderate Sedation Time: 20 minutes. The patient's level of consciousness and vital signs were monitored continuously by radiology nursing throughout the procedure under my direct supervision. FLUOROSCOPY TIME:  Fluoroscopic dose; 0 mGy COMPLICATIONS: None immediate. PROCEDURE: The procedure, risks, benefits, and alternatives were explained to the patient. Questions regarding the procedure were encouraged and answered. The patient understands and consents to the procedure. The RIGHT neck and chest were prepped with chlorhexidine in a sterile fashion, and a sterile drape was applied covering the operative field. Maximum barrier sterile technique with sterile gowns and gloves were used for the procedure. A timeout was performed prior to the initiation of the procedure. Local anesthesia was provided with 1% lidocaine with epinephrine. After creating a small venotomy incision, a micropuncture kit was utilized to access the internal jugular vein under direct, real-time ultrasound guidance. Ultrasound image documentation was performed. The microwire was kinked to measure appropriate catheter length. A subcutaneous port pocket was then created along the upper chest wall utilizing a combination of sharp and blunt dissection. The pocket was irrigated with sterile saline. A single lumen Non-ISP power injectable port was chosen for placement. The 8 Fr catheter was tunneled from the port pocket site to the venotomy incision. The port was placed in the pocket.  The external catheter was trimmed to appropriate length. At the venotomy, an 8 Fr peel-away sheath was placed over a guidewire under fluoroscopic guidance. The catheter was then placed through the sheath and the sheath was removed. Final catheter positioning was confirmed and documented with a fluoroscopic spot  radiograph. The port was accessed with a Huber needle, aspirated and flushed with heparinized saline. The port pocket incision was closed with interrupted 3-0 Vicryl suture then Dermabond was applied, including at the venotomy incision. Dressings were placed. The patient tolerated the procedure well without immediate post procedural complication. IMPRESSION: Successful placement of a RIGHT internal jugular approach power injectable Port-A-Cath. The tip of the catheter is positioned within the proximal RIGHT atrium. The catheter is ready for immediate use. Roanna Banning, MD [facility] Radiology Electronically Signed   By: Roanna Banning M.D.   On: 09/20/2022 14:44   ECHOCARDIOGRAM COMPLETE  Result Date: 09/20/2022    ECHOCARDIOGRAM REPORT   Patient Name:   BOE GOFFREDO Date of Exam: 09/20/2022 Medical Rec #:  [id number]           Height:       68.0 in Accession #:    [id number]          Weight:       177.9 lb Date of Birth:  [birth year]            BSA:          1.945 m Patient Age:    66 years            BP:           100/63 mmHg Patient Gender: M                   HR:           80 bpm. Exam Location:  Inpatient Procedure: 2D Echo, Cardiac Doppler and Color Doppler Indications:    Chemo  History:        Patient has no prior history of Echocardiogram examinations.  Sonographer:    Lucy Antigua Referring Phys: [id number] [name]  Sonographer Comments: Strain was attempted. Echo machine @ [facility] doesn't have strain Neurosurgeon. IMPRESSIONS  1. Left ventricular ejection fraction, by estimation, is 55%. The left ventricle has low normal function. The left ventricle has no regional wall motion abnormalities.  2. Right ventricular systolic function is normal. The right ventricular size is normal. There is normal pulmonary artery systolic pressure.  3. The mitral valve is normal in structure. Trivial mitral valve regurgitation. No evidence of mitral stenosis.  4. The  aortic valve is normal in structure. Aortic valve regurgitation is not visualized. No aortic stenosis is present.  5. The inferior vena cava is normal in size with greater than 50% respiratory variability, suggesting right atrial pressure of 3 mmHg. FINDINGS  Left Ventricle: Left ventricular ejection fraction, by estimation, is 50 to 55%. The left ventricle has low normal function. The left ventricle has no regional wall motion abnormalities. The left ventricular internal cavity size was normal in size. There is borderline left ventricular hypertrophy. Left ventricular diastolic parameters were normal. Right Ventricle: The right ventricular size is normal. No increase in right ventricular wall thickness. Right ventricular systolic function is normal. There is normal pulmonary artery systolic pressure. The tricuspid regurgitant velocity is 1.94 m/s, and  with an assumed right atrial pressure of 3 mmHg, the estimated right ventricular systolic pressure is 18.1 mmHg. Left Atrium:  Left atrial size was normal in size. Right Atrium: Right atrial size was normal in size. Pericardium: There is no evidence of pericardial effusion. Mitral Valve: The mitral valve is normal in structure. Trivial mitral valve regurgitation. No evidence of mitral valve stenosis. Tricuspid Valve: The tricuspid valve is normal in structure. Tricuspid valve regurgitation is trivial. No evidence of tricuspid stenosis. Aortic Valve: The aortic valve is normal in structure. Aortic valve regurgitation is not visualized. No aortic stenosis is present. Aortic valve mean gradient measures 2.0 mmHg. Aortic valve peak gradient measures 3.6 mmHg. Aortic valve area, by VTI measures 3.80 cm. Pulmonic Valve: The pulmonic valve was normal in structure. Pulmonic valve regurgitation is not visualized. No evidence of pulmonic stenosis. Aorta: The aortic root is normal in size and structure. Venous: The inferior vena cava is normal in size with greater than 50%  respiratory variability, suggesting right atrial pressure of 3 mmHg. IAS/Shunts: No atrial level shunt detected by color flow Doppler.  LEFT VENTRICLE PLAX 2D LVIDd:         3.60 cm   Diastology LVIDs:         2.70 cm   LV e' medial:    10.00 cm/s LV PW:         1.20 cm   LV E/e' medial:  4.6 LV IVS:        1.00 cm   LV e' lateral:   9.36 cm/s LVOT diam:     2.20 cm   LV E/e' lateral: 4.9 LV SV:         74 LV SV Index:   38 LVOT Area:     3.80 cm  RIGHT VENTRICLE RV S prime:     10.90 cm/s TAPSE (M-mode): 1.5 cm LEFT ATRIUM             Index        RIGHT ATRIUM           Index LA Vol (A2C):   61.4 ml 31.58 ml/m  RA Area:     13.90 cm LA Vol (A4C):   58.4 ml 30.03 ml/m  RA Volume:   32.80 ml  16.87 ml/m LA Biplane Vol: 59.6 ml 30.65 ml/m  AORTIC VALVE AV Area (Vmax):    3.46 cm AV Area (Vmean):   3.17 cm AV Area (VTI):     3.80 cm AV Vmax:           94.70 cm/s AV Vmean:          64.800 cm/s AV VTI:            0.195 m AV Peak Grad:      3.6 mmHg AV Mean Grad:      2.0 mmHg LVOT Vmax:         86.20 cm/s LVOT Vmean:        54.000 cm/s LVOT VTI:          0.195 m LVOT/AV VTI ratio: 1.00  AORTA Ao Root diam: 2.90 cm Ao Asc diam:  3.60 cm MITRAL VALVE               TRICUSPID VALVE MV Area (PHT): 3.08 cm    TR Peak grad:   15.1 mmHg MV Decel Time: 246 msec    TR Vmax:        194.00 cm/s MR Peak grad: 32.5 mmHg MR Vmax:      285.00 cm/s  SHUNTS MV E velocity: 46.30 cm/s  Systemic VTI:  0.20  m MV A velocity: 61.30 cm/s  Systemic Diam: 2.20 cm MV E/A ratio:  0.76 Aditya Sabharwal Electronically signed by Dorthula Nettles Signature Date/Time: 09/20/2022/1:15:18 PM    Final    CT ABDOMEN PELVIS W CONTRAST  Result Date: 09/18/2022 CLINICAL DATA:  Colon cancer, anorexia, weakness, fever EXAM: CT ABDOMEN AND PELVIS WITH CONTRAST TECHNIQUE: Multidetector CT imaging of the abdomen and pelvis was performed using the standard protocol following bolus administration of intravenous contrast. RADIATION DOSE REDUCTION: This  exam was performed according to the departmental dose-optimization program which includes automated exposure control, adjustment of the mA and/or kV according to patient size and/or use of iterative reconstruction technique. CONTRAST:  OMNIPAQUE IOHEXOL 300 MG/ML  SOLN COMPARISON:  08/12/2022 FINDINGS: Lower chest: There are 2 left lower lobe pulmonary nodules consistent with progressive pulmonary metastases. 1.4 cm nodule image 12/2 and a 0.8 cm nodule image 15/2. No airspace disease or effusion. Hepatobiliary: Gallbladder is moderately distended without cholelithiasis or cholecystitis. Stable cysts are seen within the liver. Since the prior exam, there is loss of normal fat plane between the inferior right lobe liver and and enlarging colonic mass at the hepatic flexure. Direct invasion of the liver cannot be excluded. Pancreas: Unremarkable. No pancreatic ductal dilatation or surrounding inflammatory changes. Spleen: Normal in size without focal abnormality. Adrenals/Urinary Tract: Bilateral adrenal masses seen previously have enlarged, consistent with metastatic disease. Right adrenal mass measures up to 5.1 cm, previously 3.5 cm. The left adrenal mass measures up to 4.7 cm, previously 3.5 cm. The kidneys enhance normally and symmetrically. No urinary tract calculi or obstructive uropathy. Bladder is unremarkable. Stomach/Bowel: A large necrotic mass at the hepatic flexure of the colon has increased in size, measuring up to 12.5 x 10.1 cm. There is increasing pericolonic fat stranding and mesenteric nodularity consistent with locally invasive disease. As discussed above, there is loss of normal fat plane between the hepatic mass and the inferior margin right lobe liver and direct invasion of the liver cannot be excluded. There is no evidence of bowel obstruction or ileus. Normal appendix right lower quadrant. Vascular/Lymphatic: Increase in the size and number of mesenteric lymph nodes surrounding the  hepatic flexure mass, consistent with local nodal metastases. Largest lymph node measures 0.8 cm reference image 56/2. There is also a soft tissue mass contiguous with the left crus of the diaphragm, which appears separate from the enlarging left adrenal mass. This measures 3.5 x 3.0 cm, new since prior study and consistent with metastatic deposit. Stable atherosclerosis of the aorta and its branches. Reproductive: Prostate is unremarkable. Other: Trace pelvic free fluid. No free intraperitoneal gas. No abdominal wall hernia. Musculoskeletal: There are no acute or destructive bony abnormalities. Reconstructed images demonstrate no additional findings. IMPRESSION: 1. Enlarging mass at the hepatic flexure of the colon consistent with progressive colonic malignancy. There is increasing pericolonic fat stranding with loss of fat plane between the mass and the right lobe liver concerning for direct hepatic involvement. 2. Increasing mesenteric adenopathy, new soft tissue mass along the crus of the left hemidiaphragm, and enlarging bilateral adrenal masses consistent with progressive intra-abdominal metastases. 3. New and enlarging left lower lobe pulmonary nodules consistent with progressive pulmonary metastatic disease. 4. Trace pelvic free fluid. 5.  Aortic Atherosclerosis (ICD10-I70.0). Electronically Signed   By: Sharlet Salina M.D.   On: 09/18/2022 18:46   DG Chest Port 1 View  Result Date: 09/18/2022 CLINICAL DATA:  Fever and vomiting EXAM: PORTABLE CHEST 1 VIEW COMPARISON:  Radiograph 09/04/2016 and  CT chest 08/22/2022 FINDINGS: Stable cardiomediastinal silhouette. Aortic atherosclerotic calcification. No focal consolidation, pleural effusion, or pneumothorax. No displaced rib fractures. IMPRESSION: No active disease. Electronically Signed   By: Minerva Fester M.D.   On: 09/18/2022 17:37    ASSESSMENT & PLAN:   67 y.o. male with:  Large bowel perforation from large right hepatic flexure colonic tumor  from diffuse large B-cell lymphoma patient is status post right hemicolectomy and IVC filter placement currently on TPN status post extubation. Newly diagnosed stage IVb large B-cell lymphoma ECHo done today showed normal EF of 55% -port a cath placed by IR -Hepatitis BC and HIV testing negative Large hepatic flexure mass from large B-cell lymphoma with impending obstruction and high risk for perforation.  Possible infection of the necrotic tissue in the tumor or bowel wall currently on antibiotics. 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  Patient is a lightheadedness and some orthostatic symptoms and severe fatigue.  Rule out adrenal insufficiency. Failure to thrive due to poor p.o. intake bowel issues and new diagnosis of large B-cell lymphoma. Dehydration due to poor p.o. intake Significant abdominal discomfort from his large colonic mass with impending obstruction. Arthritis Dyslipidemia History of gout 11.  History of obstructive sleep apnea not using CPAP at home. 12. 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  Patient appears to have some intermittent confusion and had a CT of the head which showed some changes concerning for possible CNS lymphoma versus CVA. -I discussed the concerning findings with the patient his wife and other family members at bedside. -We discussed getting an MRI of the brain with and without contrast for more details. -If the MRI still shows suspicion for lymphoma be a consulting radiology/IR for guided lumbar puncture for diagnostic purposes and possible intrathecal methotrexate. -I discussed with the patient and the family that the intrathecal methotrexate would be for possible therapeutic intervention for CNS involvement versus prophylaxis pending LP results. -We discussed that he would not be a candidate  for high-dose methotrexate that would typically be needed if this were CNS involvement by lymphoma. -Based on his MRI he might need official neurology involvement to weigh in regarding whether this is a CVA since he is also had A-fib episodes and also had some hypotensive episodes that could cause watershed infarcts. -We discussed that we will have to take his treatment considerations for continued treatment of lymphoma on a day-to-day basis based on how he is doing from a perspective of his abdominal wound as well as his CNS status. -Appreciate hospital medicine involvement. -Case was discussed in details with Dr. Ophelia Charter  .The total time spent in the appointment was 35 minutes* .  All of the patient's questions were answered with apparent satisfaction. The patient knows to call the clinic with any problems, questions or concerns.   Wyvonnia Lora MD MS AAHIVMS [facility] Hematology/Oncology Physician [facility]  .*Total Encounter Time as defined by the Centers for Medicare and Medicaid Services includes, in addition to the face-to-face time of a patient visit (documented in the note above) non-face-to-face time: obtaining and reviewing outside history, ordering and reviewing medications, tests or procedures, care coordination (communications with other health care professionals or caregivers) and documentation in the medical record.

## 2022-10-08 NOTE — Progress Notes (Addendum)
Nutrition Follow-up  DOCUMENTATION CODES:   Severe malnutrition in context of chronic illness  INTERVENTION:  - Plan to continue TPN. Goal of 135mL/hr per pharmacy, which provides: 132g protein, 336g dextrose, 648 [place] kcals = 2318 kcals - TPN management per pharmacy.   - Plan to start Clear Liquid diet today per Surgery.   - Recommend Juven BID once medically appropriate. Each packet provides 95 calories, 2.5 grams of protein (collagen), and 9.8 grams of carbohydrate (3 grams sugar); also contains 7 grams of L-arginine and L-glutamine, 300 mg vitamin C, 15 mg vitamin E, 1.2 mcg vitamin B-12, 9.5 mg zinc, 200 mg calcium, and 1.5 g  Calcium Beta-hydroxy-Beta-methylbutyrate to support wound healing  - TF discontinued per Surgery.   - Monitor weight trends. Daily weights while on TPN.   NUTRITION DIAGNOSIS:   Severe Malnutrition related to chronic illness (non-Hodgkin's lymphoma) as evidenced by mild fat depletion, mild muscle depletion, energy intake < or equal to 75% for > or equal to 1 month, percent weight loss (11% in less than 1 month). *ongoing  GOAL:   Patient will meet greater than or equal to 90% of their needs *met with TPN  MONITOR:   Diet advancement, Labs, Weight trends, I & O's  REASON FOR ASSESSMENT:   Consult Enteral/tube feeding initiation and management  ASSESSMENT:   67 y.o. male admits related to abdominal pain and poor oral intakes. PMH includes arthritis and high cholesterol. 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  5/23 Admit 5/28 ex-lap, right hemicolectomy and end ileostomy; TPN started 6/3 Intubated 6/6 Extubated; trickle TF started 6/8 TF stopped d/t patient complaints of nausea  TF has remained off since patient's complaints of nausea. Has been receiving ProSource TF20 BID.   Per Surgery, tube feed and ProSource orders discontinued today. NGT remains in place. Plan for Clear Liquid diet after  LP today.  Of note, SLP evaluated patient yesterday and recommended NPO.  TPN remains infusing at time of visit. Was decreased due to patient getting ProSource but plan to increase back up (goal 131mL/hr) today.  Patient awake and alert at time of visit. Reports he is excited to be able to have clear liquids as his mouth has been very dry. Notes another provider had mentioned eventually advancing to soft solids but he wants to take it slow and "day by day" on increasing his intake.     Admit weight: 178# Current weight: 193# Patient continues to experience edema and is +13L today which is still likely affecting weight status. Will continue to monitor trends.  Medications reviewed and include: Colace, Miralax  Labs reviewed:  K+ 3.0   Diet Order:   Diet Order             Diet NPO time specified Except for: Sips with Meds, Ice Chips  Diet effective now                   EDUCATION NEEDS:  Education needs have been addressed  Skin:  Skin Assessment: Reviewed RN Assessment Skin Integrity Issues:: Incisions Incisions: Abdomen  Last BM:  6/9 - ileostomy  Height:  Ht Readings from Last 1 Encounters:  10/02/22 5\' 8"  (1.727 m)   Weight:  Wt Readings from Last 1 Encounters:  10/08/22 87.8 kg   Ideal Body Weight:  70 kg  BMI:  Body mass index is 29.43 kg/m.  Estimated Nutritional Needs:  Kcal:  2250-2450 kcals Protein:  120-140 grams Fluid:  >/=  2.2L    Shelle Iron RD, LDN For contact information, refer to [facility]

## 2022-10-09 DIAGNOSIS — F19982 Other psychoactive substance use, unspecified with psychoactive substance-induced sleep disorder: Secondary | ICD-10-CM | POA: Diagnosis not present

## 2022-10-09 DIAGNOSIS — G893 Neoplasm related pain (acute) (chronic): Secondary | ICD-10-CM | POA: Diagnosis not present

## 2022-10-09 DIAGNOSIS — Z7189 Other specified counseling: Secondary | ICD-10-CM

## 2022-10-09 DIAGNOSIS — C8330 Diffuse large B-cell lymphoma, unspecified site: Secondary | ICD-10-CM | POA: Diagnosis not present

## 2022-10-09 DIAGNOSIS — I639 Cerebral infarction, unspecified: Secondary | ICD-10-CM

## 2022-10-09 DIAGNOSIS — R9089 Other abnormal findings on diagnostic imaging of central nervous system: Secondary | ICD-10-CM

## 2022-10-09 DIAGNOSIS — J9601 Acute respiratory failure with hypoxia: Secondary | ICD-10-CM

## 2022-10-09 DIAGNOSIS — K5903 Drug induced constipation: Secondary | ICD-10-CM | POA: Diagnosis not present

## 2022-10-09 LAB — CBC WITH DIFFERENTIAL/PLATELET
Abs Immature Granulocytes: 0.27 10*3/uL — ABNORMAL HIGH (ref 0.00–0.07)
Basophils Absolute: 0.1 10*3/uL (ref 0.0–0.1)
Basophils Relative: 1 %
Eosinophils Absolute: 0.4 10*3/uL (ref 0.0–0.5)
Eosinophils Relative: 5 %
HCT: 29.8 % — ABNORMAL LOW (ref 39.0–52.0)
Hemoglobin: 9.1 g/dL — ABNORMAL LOW (ref 13.0–17.0)
Immature Granulocytes: 3 %
Lymphocytes Relative: 9 %
Lymphs Abs: 0.8 10*3/uL (ref 0.7–4.0)
MCH: 25.4 pg — ABNORMAL LOW (ref 26.0–34.0)
MCHC: 30.5 g/dL (ref 30.0–36.0)
MCV: 83.2 fL (ref 80.0–100.0)
Monocytes Absolute: 0.4 10*3/uL (ref 0.1–1.0)
Monocytes Relative: 4 %
Neutro Abs: 6.7 10*3/uL (ref 1.7–7.7)
Neutrophils Relative %: 78 %
Platelets: 235 10*3/uL (ref 150–400)
RBC: 3.58 MIL/uL — ABNORMAL LOW (ref 4.22–5.81)
RDW: 18.4 % — ABNORMAL HIGH (ref 11.5–15.5)
WBC: 8.6 10*3/uL (ref 4.0–10.5)
nRBC: 0 % (ref 0.0–0.2)

## 2022-10-09 LAB — BASIC METABOLIC PANEL
Anion gap: 9 (ref 5–15)
BUN: 23 mg/dL (ref 8–23)
CO2: 22 mmol/L (ref 22–32)
Calcium: 7.9 mg/dL — ABNORMAL LOW (ref 8.9–10.3)
Chloride: 105 mmol/L (ref 98–111)
Creatinine, Ser: 0.52 mg/dL — ABNORMAL LOW (ref 0.61–1.24)
GFR, Estimated: >60 mL/min (ref >60)
Glucose, Bld: 92 mg/dL (ref 70–99)
Potassium: 3.3 mmol/L — ABNORMAL LOW (ref 3.5–5.1)
Sodium: 136 mmol/L (ref 135–145)

## 2022-10-09 LAB — GLUCOSE, CAPILLARY
Glucose-Capillary: 105 mg/dL — ABNORMAL HIGH (ref 70–99)
Glucose-Capillary: 86 mg/dL (ref 70–99)
Glucose-Capillary: 88 mg/dL (ref 70–99)
Glucose-Capillary: 92 mg/dL (ref 70–99)

## 2022-10-09 LAB — MAGNESIUM: Magnesium: 1.9 mg/dL (ref 1.7–2.4)

## 2022-10-09 LAB — PHOSPHORUS: Phosphorus: 3 mg/dL (ref 2.5–4.6)

## 2022-10-09 MED ORDER — POTASSIUM CHLORIDE 10 MEQ/50ML IV SOLN
10.0000 meq | INTRAVENOUS | Status: AC
  Administered 2022-10-09 (×4): 10 meq via INTRAVENOUS
  Filled 2022-10-09 (×4): qty 50

## 2022-10-09 MED ORDER — PROSOURCE TF20 ENFIT COMPATIBL EN LIQD
60.0000 mL | Freq: Two times a day (BID) | ENTERAL | Status: DC
  Administered 2022-10-09 – 2022-10-11 (×5): 60 mL
  Filled 2022-10-09 (×5): qty 60

## 2022-10-09 MED ORDER — TRAVASOL 10 % IV SOLN
INTRAVENOUS | Status: AC
  Filled 2022-10-09: qty 998.4

## 2022-10-09 MED ORDER — ENSURE ENLIVE PO LIQD
237.0000 mL | Freq: Two times a day (BID) | ORAL | Status: DC
  Administered 2022-10-10: 237 mL via ORAL

## 2022-10-09 NOTE — Consult Note (Signed)
Neurology Consultation Reason for Consult: Abnormal MRI Referring Physician: Frankey Poot  CC: Altered mental status  History is obtained from: Chart review  HPI: Justin Cameron is a 67 y.o. male who was admitted on 5/23 for abdominal pain and found to have an enlarging mass who underwent bowel resection on 5/28.  Following this he was intubated but was extubated on 5/29.  He was apparently still somewhat groggy after extubation.  He had several episodes of A-fib with RVR.  He was also found to have bilateral lower extremity DVT.  He has not been a candidate for anticoagulation due to his tumors..  He was confused during this time, and it was felt to be an acute metabolic encephalopathy, which is understandable given the large number of potential contributing culprits.  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  LP was going to be attempted but an MRI was obtained which strongly suggestive of stroke as etiology instead.   LKW: Unclear tnk given?: no, recent surgery Past Medical History:  Diagnosis Date   Arthritis    High cholesterol      Family History  Problem Relation Age of Onset   Atrial fibrillation Mother    Alzheimer's disease Mother    Leukemia Father    Diabetes Father    Colon cancer Neg Hx    Rectal cancer Neg Hx    Esophageal cancer Neg Hx    Stomach cancer Neg Hx      Social History:  reports that he has never smoked. He has never used smokeless tobacco. He reports that he does not currently use alcohol. He reports that he does not currently use drugs.   Exam: Current vital signs: BP (!) 112/59   Pulse 90   Temp 98.3 F (36.8 C) (Oral)   Resp (!) 25   Ht 5\' 8"  (1.727 m)   Wt 90.5 kg   SpO2 98%   BMI 30.34 kg/m  Vital signs in last 24 hours: Temp:  [98.1 F (36.7 C)-98.4 F (36.9 C)] 98.3 F (36.8 C) (06/11 2100) Pulse Rate:  [79-101] 90 (06/12  0100) Resp:  [13-25] 25 (06/12 0100) BP: (111-152)/(48-72) 112/59 (06/12 0100) SpO2:  [92 %-98 %] 98 % (06/12 0100) Weight:  [90.5 kg] 90.5 kg (06/12 0500)   Physical Exam  Appears well-developed and well-nourished.   Neuro: Mental Status: Patient is awake, alert, oriented to person, place, month, year, and situation. Patient is able to give a clear and coherent history. No signs of aphasia or neglect Cranial Nerves: II: Visual Fields are full. Pupils are equal, round, and reactive to light.   III,IV, VI: EOMI without ptosis or diploplia.  V: Facial sensation is symmetric to temperature VII: Facial movement is symmetric.  VIII: hearing is intact to voice X: Uvula elevates symmetrically XI: Shoulder shrug is symmetric. XII: tongue is midline without atrophy or fasciculations.  Motor: Tone is normal. Bulk is normal. 5/5 strength was present on right side he has very minimal drift in his left upper extremity, and 4/5 weakness of his left lower extremity, good on the right Sensory: Sensation is symmetric to light touch and temperature in the arms and legs.  He does not extinguish to double simultaneous stimulation Cerebellar: No clear ataxia     I have reviewed labs in epic and the results pertinent to this consultation are: Creatinine 0.52 Hemoglobin 9.1  Echo from 5/24-EF 55%, no clear embolic source He does have lower extremity DVT status post IVC filter placement  I have reviewed the images obtained: MRI brain-large area of T2 >diffusion change in the right parietal region with cortical enhancement.  There is also diffuse petechial edema throughout this area.  I strongly suspect that this represents posterior division MCA infarct, however it does appear to be in encroaching on the PCA territory some, though this could be posterior watershed.  The appearance does appear more consistent with a subacute stroke, however the radiology report did note the lymphomatous involvement  could appear similar.  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  The appearance I do feel is much more characteristic of stroke, but I would expect him to have significantly more symptoms than I was able to detect on my bedside exam.  If this were lymphoma, it could be more slowly growing and insidious to explain the mild exam, but the appearance would be very unusual.  I do think an LP would be reasonable, though given the amount of cortically-based hemorrhage, I think there is likely some degree of irritation and do not think a pleocytosis would be very helpful.  Cytology, however, could be quite helpful if it does end up being positive.  When I expressed this recommendation to the patient, he is adamant that he does not think he needs an LP, and I do not feel strongly enough about it to override him on that.  Repeating MRI in a few weeks to ensure expected evolution I think would be the alternative.  Recommendations: 1) if patient agrees, continue to think LP with cytology, cells, protein, glucose could be helpful. 2) start ASA 81 mg daily when okay from surgical perspective 3) CT angiogram head and neck 4) continue telemetry 5) PT, OT, ST    Ritta Slot, MD [facility] [phone]  If 7pm- 7am, please page neurology on call as listed in [facility].

## 2022-10-09 NOTE — Progress Notes (Signed)
HEMATOLOGY/ONCOLOGY INPATIENT PROGRESS NOTE  Date of Service: 10/08/2022  Inpatient Attending: .Dr Ophelia Charter   SUBJECTIVE Patient was seen in medical oncology follow-up with his wife and other family members at bedside.  He is more awake but on neurological examination has some possible left-sided visual field deficits and left-sided defects in 2 point tactile discrimination in his upper extremity.  MRI was concerning for possible subacute CVA with less likely possibility of lymphoma.  Neurology has been officially consulted to weigh in. Lumbar puncture is still pending.  Radiology and IR were not able to get the patient down today for lumbar puncture and recommended neurology to the LP at bedside since the patient cannot lie down on his abdomen. No overt fevers.  Wound VAC has been removed at this time.  Patient was noting some soreness in his throat which is a little better today.  OBJECTIVE:  NAD  PHYSICAL EXAMINATION: . Vitals:   10/09/22 0400 10/09/22 0500 10/09/22 0600 10/09/22 0700  BP: (!) 152/58 (!) 141/67 (!) 111/46 (!) 121/46  Pulse: (!) 109 (!) 101 (!) 101 100  Resp: (!) 26 (!) 30 16 17   Temp: 99.8 F (37.7 C)     TempSrc: Oral     SpO2: 98% 95% 92% 94%  Weight:  199 lb 8.3 oz (90.5 kg)    Height:       GENERAL: More awake today with fairly clear speech with some episodes of confusion which is easily cleared. OROPHARYNX: NG tube in situ NECK: supple, no JVD LYMPH:  no palpable lymphadenopathy in the cervical, axillary or inguinal regions LUNGS: clear to auscultation b/l with normal respiratory effort HEART: regular rate & rhythm, mild tachy ABDOMEN: hypoactive bowel sounds Extremity: trace b/l pedal edema PSYCH: More awake and verbal today. NEURO: moving all 4 extremities   MEDICAL HISTORY:  Past Medical History:  Diagnosis Date   Arthritis    High cholesterol     SURGICAL HISTORY: Past Surgical History:  Procedure Laterality Date   IR IMAGING GUIDED  PORT INSERTION  09/20/2022   IR IVC FILTER PLMT / S&I /IMG GUID/MOD SED  09/25/2022   LAPAROTOMY N/A 08/28/2022   Procedure: EXPLORATORY LAPAROTOMY, BOWEL RESECTION, ILEOSTOMY;  Surgeon: Harriette Bouillon, MD;  Location: [facility];  Service: General;  Laterality: N/A;   REPLACEMENT TOTAL KNEE  10/2018   ROTATOR CUFF REPAIR      SOCIAL HISTORY: Social History   Socioeconomic History   Marital status: Married    Spouse name: Not on file   Number of children: Not on file   Years of education: Not on file   Highest education level: Not on file  Occupational History   Not on file  Tobacco Use   Smoking status: Never   Smokeless tobacco: Never  Vaping Use   Vaping Use: Never used  Substance and Sexual Activity   Alcohol use: Not Currently   Drug use: Not Currently   Sexual activity: Not on file  Other Topics Concern   Not on file  Social History Narrative   Not on file   Social Determinants of Health   Financial Resource Strain: Not on file  Food Insecurity: No Food Insecurity (09/23/2022)   Hunger Vital Sign    Worried About Running Out of Food in the Last Year: Never true    Ran Out of Food in the Last Year: Never true  Transportation Needs: No Transportation Needs (09/23/2022)   PRAPARE - Administrator, Civil Service (Medical):  No    Lack of Transportation (Non-Medical): No  Physical Activity: Not on file  Stress: Not on file  Social Connections: Not on file  Intimate Partner Violence: Not At Risk (09/23/2022)   Humiliation, Afraid, Rape, and Kick questionnaire    Fear of Current or Ex-Partner: No    Emotionally Abused: No    Physically Abused: No    Sexually Abused: No    FAMILY HISTORY: Family History  Problem Relation Age of Onset   Atrial fibrillation Mother    Alzheimer's disease Mother    Leukemia Father    Diabetes Father    Colon cancer Neg Hx    Rectal cancer Neg Hx    Esophageal cancer Neg Hx    Stomach cancer Neg Hx     ALLERGIES:  has no  allergies on file.  MEDICATIONS:  Scheduled Meds:  allopurinol  100 mg Oral BID   Chlorhexidine Gluconate Cloth  6 each Topical Daily   docusate  100 mg Per Tube BID   insulin aspart  0-9 Units Subcutaneous Q6H   polyethylene glycol  17 g Per Tube Daily   sodium chloride flush  10-40 mL Intracatheter Q12H   sodium chloride flush  5 mL Intracatheter Q8H   Continuous Infusions:  sodium chloride Stopped (10/02/22 2010)   sodium chloride 10 mL/hr at 10/09/22 0700   cefTRIAXone (ROCEPHIN)  IV Stopped (10/08/22 1703)   dextrose Stopped (10/08/22 1848)   potassium chloride 10 mEq (10/09/22 0802)   TPN ADULT (ION) 100 mL/hr at 10/09/22 0700   PRN Meds:.sodium chloride, sodium chloride, acetaminophen (TYLENOL) oral liquid 160 mg/5 mL, acetaminophen, diphenhydrAMINE, fentaNYL (SUBLIMAZE) injection, naphazoline-glycerin, ondansetron (ZOFRAN) IV, mouth rinse, prochlorperazine, sodium chloride flush  REVIEW OF SYSTEMS:   .10 Point review of Systems was done is negative except as noted above.  LABORATORY DATA:  I have reviewed the data as listed .Marland Kitchen    Latest Ref Rng & Units 10/08/2022    2:12 AM 10/07/2022    6:15 AM  CBC  WBC 4.0 - 10.5 K/uL 10.5  11.9   Hemoglobin 13.0 - 17.0 g/dL 8.7  9.2   Hematocrit 16.1 - 52.0 % 28.2  30.4   Platelets 150 - 400 K/uL 257  291    .    Latest Ref Rng & Units 10/08/2022    2:12 AM 10/07/2022    6:15 AM  CMP  Glucose 70 - 99 mg/dL 096  045   BUN 8 - 23 mg/dL 30  28   Creatinine 4.09 - 1.24 mg/dL 8.11  9.14   Sodium 782 - 145 mmol/L 144  145   Potassium 3.5 - 5.1 mmol/L 3.0  3.4   Chloride 98 - 111 mmol/L 112  113   CO2 22 - 32 mmol/L 24  25   Calcium 8.9 - 10.3 mg/dL 8.0  8.1   Total Protein 6.5 - 8.1 g/dL  5.0   Total Bilirubin 0.3 - 1.2 mg/dL  1.1   Alkaline Phos 38 - 126 U/L  116   AST 15 - 41 U/L  25   ALT 0 - 44 U/L  19      Component     Latest Ref Rng 09/20/22 09/20/2022 09/21/2022  HCV Ab     NON REACTIVE  NON REACTIVE      Hepatitis B Surface Ag     NON REACTIVE  NON REACTIVE     Hep B Core Total Ab     NON REACTIVE  NON REACTIVE     Procalcitonin     ng/mL 0.22     Cortisol, Plasma     ug/dL 40.9     HIV Screen 4th Generation wRfx     Non Reactive   Non Reactive    Uric Acid, Serum     3.7 - 8.6 mg/dL   5.4     I have personally reviewed the radiological images as listed and agreed with the findings in the report. MR BRAIN W WO CONTRAST  Result Date: 10/08/2022 CLINICAL DATA:  Concern for CNS involvement from high-grade lymphoma EXAM: MRI HEAD WITHOUT AND WITH CONTRAST TECHNIQUE: Multiplanar, multiecho pulse sequences of the brain and surrounding structures were obtained without and with intravenous contrast. CONTRAST:  9mL GADAVIST GADOBUTROL 1 MMOL/ML IV SOLN COMPARISON:  No prior MRI available, correlation is made with CT head 610 2024 FINDINGS: Brain: Gyral enhancement in a sharply demarcated, contiguous area involving the right temporal lobe, occipital lobe, and inferior parietal lobe. These areas are associated with increased T2 hyperintense signal and gyral swelling, as well as hemosiderin deposition, likely petechial hemorrhage. Some areas of cortex to have increased signal on diffusion-weighted imaging with ADC correlates (for example series 5, image 19 and series 6, image 19), which may be related to susceptibility. Mild mass effect on the right occipital and temporal horns of the lateral ventricle, but no significant midline shift. No other restricted diffusion to suggest acute or subacute infarct. No other parenchymal hemorrhage or midline shift. No hydrocephalus or extra-axial collection. Vascular: Normal arterial flow voids. Normal arterial and venous enhancement. Skull and upper cervical spine: Normal marrow signal. Sinuses/Orbits: Clear paranasal sinuses. No acute finding in the orbits. Other: The mastoid air cells are well aerated. IMPRESSION: Gyral enhancement in a sharply demarcated, contiguous  area involving the right temporal lobe, occipital lobe, and inferior parietal lobe, with associated gyral swelling and hemosiderin deposition, likely petechial hemorrhage. This appears localized to the posterior MCA territory, and is favored to represent a subacute infarct, although lymphomatous involvement could appear similar. These results will be called to the ordering clinician or representative by the Radiologist Assistant, and communication documented in the PACS or [employer]. Electronically Signed   By: Wiliam Ke M.D.   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 EXAM: CT HEAD WITHOUT AND WITH CONTRAST TECHNIQUE: Contiguous axial images were obtained from the base of the skull through the vertex without and with intravenous contrast. RADIATION DOSE REDUCTION: This exam was performed according to the departmental dose-optimization program which includes automated exposure control, adjustment of the mA and/or kV according to patient size and/or use of iterative reconstruction technique. CONTRAST:  75mL OMNIPAQUE IOHEXOL 300 MG/ML  SOLN COMPARISON:  None Available. FINDINGS: Brain: Leptomeningeal and gyral enhancement along the posterior right temporal lobe, extending into the inferior parietal and occipital lobes with subjacent hypoattenuation of the gray and white matter, suspicious for CNS involvement of lymphoma. No significant mass effect or midline shift. No acute hemorrhage. No hydrocephalus or extra-axial collection. Vascular: No hyperdense vessel or unexpected calcification. Visible vessels are patent. Skull: No calvarial fracture or suspicious bone lesion. Skull base is unremarkable. Sinuses/Orbits: Unremarkable. Other: None. IMPRESSION: Leptomeningeal and gyral enhancement along the posterior right temporal lobe, extending into the inferior right parietal and occipital lobes with  subjacent hypoattenuation of the gray and white matter, suspicious for CNS involvement of lymphoma. No  significant mass effect or midline shift. Electronically Signed   By: Orvan Falconer M.D.   On: 10/07/2022 12:57   DG Abd 1 View  Result Date: 10/06/2022 CLINICAL DATA:  Inpatient, abdominal pain, small-bowel obstruction EXAM: ABDOMEN - 1 VIEW COMPARISON:  10/03/2022 abdominal radiograph FINDINGS: IVC filter again noted in the medial right abdomen. Transgluteal right pelvic pigtail catheter overlies the midline lower sacrum. Surgical drain overlies the right abdomen. Right lower quadrant ostomy. No disproportionately dilated small bowel loops. Decreased mild to moderate stool in the remnant colon. No evidence of pneumatosis or pneumoperitoneum on this supine view. Marked lumbar spondylosis. IMPRESSION: Nonobstructive bowel gas pattern. Decreased mild to moderate stool in the remnant colon. Electronically Signed   By: Delbert Phenix M.D.   On: 10/06/2022 16:13   DG Abd Portable 1V  Result Date: 10/03/2022 CLINICAL DATA:  NG tube placement. EXAM: PORTABLE ABDOMEN - 1 VIEW COMPARISON:  09/16/2022. FINDINGS: Nasogastric tube passes below the diaphragm, tip in the mid stomach. Normal bowel gas pattern. IMPRESSION: Well-positioned nasal/orogastric tube. Electronically Signed   By: Amie Portland M.D.   On: 10/03/2022 12:56   DG Chest Port 1 View  Result Date: 10/02/2022 CLINICAL DATA:  Dyspnea. EXAM: PORTABLE CHEST 1 VIEW COMPARISON:  September 30, 2022. FINDINGS: Stable cardiomediastinal silhouette. Endotracheal and nasogastric tubes are unchanged in position. Right internal jugular Port-A-Cath is unchanged. Right-sided PICC line is unchanged. Minimal bibasilar subsegmental atelectasis is noted. Bony thorax is unremarkable. IMPRESSION: Stable support apparatus. Minimal bibasilar subsegmental atelectasis. Electronically Signed   By: Lupita Raider M.D.   On: 10/02/2022 11:22   DG CHEST PORT 1 VIEW  Result Date:  09/30/2022 CLINICAL DATA:  OG tube and endotracheal tube placement. EXAM: PORTABLE CHEST 1 VIEW COMPARISON:  09/01/2022 FINDINGS: An endotracheal tube with tip 2.5 cm above the carina, RIGHT PICC line with tip difficult to visualize but appears to overlie the RIGHT atrium and a RIGHT Port-A-Cath with tip overlying the RIGHT atrium as well. An enteric tube is noted with tip overlying the mid stomach. Bibasilar opacities/atelectasis noted. There is no evidence of pneumothorax or large pleural effusion. IMPRESSION: 1. RIGHT PICC line placement but otherwise unchanged appearance of the chest. Other support apparatus as described with mild bibasilar opacities/atelectasis. Electronically Signed   By: Harmon Pier M.D.   On: 09/30/2022 17:26   CT GUIDED PERITONEAL/RETROPERITONEAL FLUID DRAIN BY PERC CATH  Result Date: 09/30/2022 INDICATION: [id number] Postprocedural intraabdominal abscess [id number] EXAM: CT-guided pelvic drain placement TECHNIQUE: Multidetector CT imaging of the abdomen and pelvis was performed following the standard protocol without IV contrast. RADIATION DOSE REDUCTION: This exam was performed according to the departmental dose-optimization program which includes automated exposure control, adjustment of the mA and/or kV according to patient size and/or use of iterative reconstruction technique. MEDICATIONS: The patient is currently admitted to the hospital and receiving intravenous antibiotics. The antibiotics were administered within an appropriate time frame prior to the initiation of the procedure. ANESTHESIA/SEDATION: Moderate (conscious) sedation was employed during this procedure. A total of Versed 2 mg and Fentanyl 100 mcg was administered intravenously by the radiology nurse. Total intra-service moderate Sedation Time: 19 minutes. The patient's level of consciousness and vital signs were monitored continuously by radiology nursing throughout the procedure under my direct supervision. COMPLICATIONS:  None immediate. PROCEDURE: Informed written consent was obtained from the patient after a thorough discussion of the procedural risks, benefits and alternatives. All questions were addressed. Maximal Sterile Barrier Technique was utilized including caps, mask, sterile gowns,  sterile gloves, sterile drape, hand hygiene and skin antiseptic. A timeout was performed prior to the initiation of the procedure. The patient was placed supine on the exam table. Limited CT of the abdomen and pelvis was performed for planning purposes. This demonstrated significant improvement of gas and fluid collection in the right lower quadrant. The surgically placed drainage catheter courses through this collection. Also noted was unchanged appearance a sizable posterior pelvic collection. Given these findings, the decision was made to proceed with placement of a transgluteal pelvic drainage catheter. The patient was repositioned in the lateral decubitus position. Skin entry site was marked, and the overlying skin was prepped and draped in the standard sterile fashion. Local analgesia was obtained with 1% lidocaine. Using intermittent CT fluoroscopy, a 19 gauge Yueh catheter was advanced towards the identified posterior pelvic fluid collection using a right transgluteal approach. Location was confirmed with CT and return of purulent material. Over an Amplatz wire, the percutaneous tract was serially dilated to accommodate a 12 French locking drainage catheter. Location was again confirmed with CT and return of additional fluid. Locking loop was formed, the drainage catheter was secured to the skin using silk suture and a dressing. It was attached to bulb suction. The patient tolerated the procedure well without immediate complication. IMPRESSION: 1. CT imaging demonstrated significant improvement in the right lower quadrant collection with surgical drainage catheter in place. Given these findings, the decision was made to proceed with  drainage of the unchanged posterior pelvic collection. 2. Successful CT-guided placement of a 12 French locking drainage catheter into the posterior pelvic collection via a right transgluteal approach. Drainage catheter placed to bulb suction. Electronically Signed   By: Olive Bass M.D.   On: 09/30/2022 16:21   CT ABDOMEN PELVIS W CONTRAST  Result Date: 09/29/2022 CLINICAL DATA:  Abdominal pain.  Postop. EXAM: CT ABDOMEN AND PELVIS WITH CONTRAST TECHNIQUE: Multidetector CT imaging of the abdomen and pelvis was performed using the standard protocol following bolus administration of intravenous contrast. RADIATION DOSE REDUCTION: This exam was performed according to the departmental dose-optimization program which includes automated exposure control, adjustment of the mA and/or kV according to patient size and/or use of iterative reconstruction technique. CONTRAST:  80mL OMNIPAQUE IOHEXOL 300 MG/ML  SOLN COMPARISON:  09/18/2022. FINDINGS: Lower chest: Small right and trace left pleural effusions. Associated dependent lower lobe atelectasis, also greater on the right. Small focus of atelectasis at the base of the left upper lobe lingula. Hepatobiliary: Liver normal in size and attenuation. Multiple small low-attenuation liver masses consistent with cysts stable from the prior CT. Normal gallbladder. No bile duct dilation. Pancreas: Unremarkable. Spleen: Normal. Adrenals/Urinary Tract: Bilateral adrenal masses, unchanged from the recent prior CT, right 5.1 cm, 2 on the left 3.6 and 4.2 cm. Normal kidneys, ureters and bladder. Stomach/Bowel: Since the prior CT, abdominal surgery has been performed. There is a midline incision with the skin and subcutaneous fat open, fascia closed. Colon has been ligated at the level of the right mid transverse section. The ileum extends into a right anterior mid abdomen ostomy. There are postoperative collections. The largest is a heterogeneous right abdominal collection in the  area of the necrotic mass. Soft tissue attenuation along some of the margins of this collection may reflect necrotic tissue or decompressed small bowel or a combination. The collection contains fluid and air. It measures approximately 23 cm from superior to inferior by 16 x 10 cm transversely. A drainage catheter lies within the mid to upper portion  of this collection. Small subcapsular collection lies along the right liver margin. A subcapsular component of the larger collection lies along the anterior inferior aspect of the right liver lobe. Fluid is also seen distending the posterior pelvic recess. Remaining colon shows a generalized mild increase in the colonic stool burden, but no wall thickening or convincing inflammation. There is no small bowel dilation to suggest obstruction. Stomach is moderately distended, but without wall thickening. Tiny bubbles of free air lie just deep to the upper abdominal incision line. Vascular/Lymphatic: Well-positioned vena cava filter. Aortic atherosclerosis. Prominent gastrohepatic ligament lymph nodes. Mesenteric nodes noted on the prior CT are less well-defined due to postoperative fluid/edema. Reproductive: Unremarkable. Other: None. Musculoskeletal: No fracture or acute finding. No osteoblastic or osteolytic lesions. IMPRESSION: 1. Status post abdominal surgery since the previous CT scan. Large irregular fluid collection in the right abdomen lies in the location of the previous seen large necrotic mass. Some of this collection may reflect the residual mass. Collection extends from the subcapsular inferior right liver to the right pelvis measuring 23 x 10 x 16 cm. Surgical drain lies within the upper to mid aspect of this collection. 2. 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 No evidence of bowel obstruction. 3. Findings of metastatic disease noted on the recent prior CT are unchanged. Electronically Signed   By: Amie Portland M.D.   On: 09/29/2022 16:02   VAS [place] LOWER EXTREMITY VENOUS (DVT)  Result Date: 09/25/2022  Lower Venous DVT Study Patient Name:  IMON SALAMON  Date of Exam:   09/25/2022 Medical Rec #: [id number]            Accession #:    [id number] Date of Birth: [birth year]             Patient Gender: M Patient Age:   80 years Exam Location:  [facility] Procedure:      VAS [place] LOWER EXTREMITY VENOUS (DVT) Referring Phys: Hosie Spangle --------------------------------------------------------------------------------  Indications: Edema.  Risk Factors: Chemotherapy Cancer Lymphoma. Comparison Study: No previous exams Performing Technologist: Jody Hill RVT, RDMS  Examination Guidelines: A complete evaluation includes B-mode imaging, spectral Doppler, color Doppler, and power Doppler as needed of all accessible portions of each vessel. Bilateral testing is considered an integral part of a complete examination. Limited examinations for reoccurring indications may be performed as noted. The reflux portion of the exam is performed with the patient in reverse Trendelenburg.  +---------+---------------+---------+-----------+----------+--------------+ RIGHT    CompressibilityPhasicitySpontaneityPropertiesThrombus Aging +---------+---------------+---------+-----------+----------+--------------+ CFV      Full           Yes      Yes                                 +---------+---------------+---------+-----------+----------+--------------+ SFJ      Full                                                        +---------+---------------+---------+-----------+----------+--------------+ FV Prox  Full           Yes      Yes                                 +---------+---------------+---------+-----------+----------+--------------+  FV Mid   Full           Yes      Yes                                 +---------+---------------+---------+-----------+----------+--------------+ FV DistalFull            Yes      Yes                                 +---------+---------------+---------+-----------+----------+--------------+ PFV      Full                                                        +---------+---------------+---------+-----------+----------+--------------+ POP      Full           Yes      Yes                                 +---------+---------------+---------+-----------+----------+--------------+ PTV      None           No       No                   Acute          +---------+---------------+---------+-----------+----------+--------------+ PERO     None           No       No                   Acute          +---------+---------------+---------+-----------+----------+--------------+   +---------+---------------+---------+-----------+----------+--------------+ LEFT     CompressibilityPhasicitySpontaneityPropertiesThrombus Aging +---------+---------------+---------+-----------+----------+--------------+ CFV      Full           Yes      Yes                                 +---------+---------------+---------+-----------+----------+--------------+ SFJ      Full                                                        +---------+---------------+---------+-----------+----------+--------------+ FV Prox  Full           Yes      Yes                                 +---------+---------------+---------+-----------+----------+--------------+ FV Mid   Full           Yes      Yes                                 +---------+---------------+---------+-----------+----------+--------------+ FV DistalFull           Yes      Yes                                 +---------+---------------+---------+-----------+----------+--------------+  PFV      Full                                                        +---------+---------------+---------+-----------+----------+--------------+ POP      Full           Yes      Yes                                  +---------+---------------+---------+-----------+----------+--------------+ PTV      Full                                                        +---------+---------------+---------+-----------+----------+--------------+ PERO     None           No       No                   Acute          +---------+---------------+---------+-----------+----------+--------------+     Summary: BILATERAL: -No evidence of popliteal cyst, bilaterally. RIGHT: - Findings consistent with acute deep vein thrombosis involving the right peroneal veins, and right posterior tibial veins.  LEFT: - Findings consistent with acute deep vein thrombosis involving the left peroneal veins.  *See table(s) above for measurements and observations. Electronically signed by Coral Else MD on 09/25/2022 at 5:07:37 PM.    Final    IR IVC FILTER PLMT / S&I Lenise Arena GUID/MOD SED  Result Date: 09/25/2022 CLINICAL DATA:  67 year old male with history of diffuse large B-cell lymphoma and lower extremity deep vein thrombosis with contraindication to anticoagulation due to high risk of lymphoma hemorrhagic transformation. EXAM: 1. ULTRASOUND GUIDANCE FOR VASCULAR ACCESS OF THE RIGHT internal jugular VEIN. 2. IVC VENOGRAM. 3. PERCUTANEOUS IVC FILTER PLACEMENT. ANESTHESIA/SEDATION: Moderate (conscious) sedation was employed during this procedure. A total of Versed 1 mg and Fentanyl 50 mcg was administered intravenously. Moderate Sedation Time: 12 minutes. The patient's level of consciousness and vital signs were monitored continuously by radiology nursing throughout the procedure under my direct supervision. CONTRAST:  30mL OMNIPAQUE IOHEXOL 300 MG/ML SOLN, 7mL OMNIPAQUE IOHEXOL 300 MG/ML SOLN FLUOROSCOPY TIME:  One hundred five mGy. PROCEDURE: The procedure, risks, benefits, and alternatives were explained to the patient. Questions regarding the procedure were encouraged and answered. The patient understands and consents to the procedure. The  patient was prepped with Betadine in a sterile fashion, and a sterile drape was applied covering the operative field. A sterile gown and sterile gloves were used for the procedure. Local anesthesia was provided with 1% Lidocaine. Under direct ultrasound guidance, a 21 gauge needle was advanced into the right internal jugular vein with ultrasound image documentation performed. After securing access with a micropuncture dilator, a guidewire was advanced into the inferior vena cava. A deployment sheath was advanced over the guidewire. This was utilized to perform IVC venography. The deployment sheath was further positioned in an appropriate location for filter deployment. A Denali IVC filter was then advanced in the sheath. This was then fully deployed in the infrarenal IVC. Final filter position was confirmed with a  fluoroscopic spot image. Contrast injection was also performed through the sheath under fluoroscopy to confirm patency of the IVC at the level of the filter. After the procedure the sheath was removed and hemostasis obtained with manual compression. COMPLICATIONS: None. FINDINGS: IVC venography demonstrates a normal caliber IVC with no evidence of thrombus. Renal veins are identified bilaterally. The IVC filter was successfully positioned below the level of the renal veins and is appropriately oriented. This IVC filter has both permanent and retrievable indications. IMPRESSION: Placement of percutaneous IVC filter in infrarenal IVC. IVC venogram shows no evidence of IVC thrombus and normal caliber of the inferior vena cava. This filter does have both permanent and retrievable indications. PLAN: This IVC filter is potentially retrievable. The patient will be assessed for filter retrieval by Interventional Radiology in approximately 8-12 weeks. Further recommendations regarding filter retrieval, continued surveillance or declaration of device permanence, will be made at that time. Marliss Coots, MD [facility] Radiology Electronically Signed   By: Marliss Coots M.D.   On: 09/25/2022 15:20   ECHOCARDIOGRAM LIMITED  Result Date: 09/25/2022    ECHOCARDIOGRAM LIMITED REPORT   Patient Name:   SHAMEL ABIDI Date of Exam: 09/25/2022 Medical Rec #:  [id number]           Height:       68.0 in Accession #:    [id number]          Weight:       178.8 lb Date of Birth:  Nov 04, 1955            BSA:          1.949 m Patient Age:    66 years            BP:           94/56 mmHg Patient Gender: M                   HR:           100 bpm. Exam Location:  Inpatient Procedure: Limited Echo, Cardiac Doppler, Color Doppler and Intracardiac            Opacification Agent Indications:    Pulmonary embolis  History:        Patient has prior history of Echocardiogram examinations, most                 recent 09/20/2022.  Sonographer:    Lucy Antigua Referring Phys: [id number] Adam Phenix IMPRESSIONS  1. Left ventricular ejection fraction, by estimation, is 45 to 50%. The left ventricle has mildly decreased function. There is the interventricular septum is flattened in systole, consistent with right ventricular pressure overload.  2. Right ventricular systolic function is mildly reduced. The right ventricular size is mildly enlarged.  3. Mild mitral valve regurgitation.  4. The aortic valve is tricuspid. Aortic valve sclerosis is present, with no evidence of aortic valve stenosis. Comparison(s): Prior images reviewed side by side. Compared to recent prior study, LVEF is mildly decreased, with evidence of new RV strain. This study uses echo contrast. FINDINGS  Left Ventricle: Left ventricular ejection fraction, by estimation, is 45 to 50%. The left ventricle has mildly decreased function. Definity contrast agent was given IV to delineate the left ventricular endocardial borders. The interventricular septum is  flattened in systole, consistent with right ventricular pressure overload. Right  Ventricle: The right ventricular size is mildly enlarged. Right ventricular systolic function is mildly reduced. Mitral  Valve: Mild mitral valve regurgitation. Tricuspid Valve: Tricuspid valve regurgitation is mild. Aortic Valve: The aortic valve is tricuspid. Aortic valve sclerosis is present, with no evidence of aortic valve stenosis. Pulmonic Valve: The pulmonic valve was normal in structure. Pulmonic valve regurgitation is trivial. No evidence of pulmonic stenosis. Additional Comments: Spectral Doppler performed. Color Doppler performed.   LV Volumes (MOD) LV vol d, MOD A2C: 77.5 ml LV vol d, MOD A4C: 70.7 ml LV vol s, MOD A2C: 44.8 ml LV vol s, MOD A4C: 37.7 ml LV SV MOD A2C:     32.7 ml LV SV MOD A4C:     70.7 ml LV SV MOD BP:      35.1 ml Riley Lam MD Electronically signed by Riley Lam MD Signature Date/Time: 09/25/2022/1:37:02 PM    Final    [place] EKG SITE RITE  Result Date: 09/25/2022 If Site Rite image not attached, placement could not be confirmed due to current cardiac rhythm.  DG Chest 1 View  Result Date: 09/20/2022 CLINICAL DATA:  Check endotracheal tube placement EXAM: PORTABLE CHEST 1 VIEW COMPARISON:  Film from earlier in the same day. FINDINGS: Gastric catheter is noted within the stomach. Endotracheal tube is noted in satisfactory position. Right chest wall port is seen and stable. Lungs are well aerated bilaterally. Minimal right effusion is seen. No acute bony abnormality is noted. IMPRESSION: Minimal right-sided effusion. Electronically Signed   By: Alcide Clever M.D.   On: 08/30/2022 20:25   DG CHEST PORT 1 VIEW  Result Date: 09/08/2022 CLINICAL DATA:  Evaluate gastric catheter placement EXAM: PORTABLE CHEST 1 VIEW COMPARISON:  09/18/2022 FINDINGS: Cardiac shadow is stable but accentuated by the portable technique. The overall inspiratory effort is poor with right basilar atelectasis. Right chest wall port is seen in satisfactory position. No pneumothorax is  noted. Gastric catheter is noted with the tip superimposed over the left mainstem bronchus. This may still lie within the mid esophagus. This should be withdrawn and readvanced. IMPRESSION: Gastric catheter as described. This should be withdrawn and readvanced into the stomach. Electronically Signed   By: Alcide Clever M.D.   On: 09/27/2022 15:09   DG Abd Portable 1V  Result Date: 09/01/2022 CLINICAL DATA:  Check gastric catheter placement EXAM: PORTABLE ABDOMEN - 1 VIEW COMPARISON:  Film from earlier in the same day. FINDINGS: Scattered large and small bowel gas is noted. Retained fecal material within the colon is seen. No free air is noted. Gastric catheter is not visualized. Chest x-ray may be helpful for further evaluation. IMPRESSION: Gastric catheter is not visualized on this exam. Electronically Signed   By: Alcide Clever M.D.   On: 09/05/2022 15:07   DG Abd 1 View  Result Date: 09/23/2022 CLINICAL DATA:  Nasogastric placement EXAM: ABDOMEN - 1 VIEW COMPARISON:  Earlier same day FINDINGS: Nasogastric tube enters the stomach and coils in the fundus. Less intestinal gas than was noted on the prior image. Lucency in the right upper quadrant was not seen on the previous images. Free intraperitoneal air is not excluded. If there is clinical concern regarding this possibility, either left-side-down decubitus imaging or CT abdomen would be suggested. IMPRESSION: 1. Nasogastric tube enters the stomach and coils in the fundus. 2. Less intestinal gas than was noted on the prior image. 3. Lucency in the right upper quadrant was not seen on the previous images. Free intraperitoneal air is not excluded. See above discussion. Electronically Signed   By: Paulina Fusi M.D.   On:  09/14/2022 12:14   DG Abd Portable 1V  Result Date: 09/21/2022 CLINICAL DATA:  Abdominal pain. EXAM: PORTABLE ABDOMEN - 1 VIEW COMPARISON:  None Available. FINDINGS: Mildly dilated air-filled small bowel loops are noted concerning for  ileus or possibly distal small bowel obstruction. No colonic dilatation is noted. No abnormal calcifications are noted. IMPRESSION: Mildly dilated air-filled small bowel loops are noted concerning for ileus or possibly distal small bowel obstruction. Electronically Signed   By: Lupita Raider M.D.   On: 09/06/2022 09:05   IR IMAGING GUIDED PORT INSERTION  Result Date: 09/20/2022 INDICATION: Colonic mass.  New diagnosis of lymphoma. EXAM: IMPLANTED PORT A CATH PLACEMENT WITH ULTRASOUND AND FLUOROSCOPIC GUIDANCE MEDICATIONS: Ancef 2 gm IV; The antibiotic was administered within an appropriate time interval prior to skin puncture. ANESTHESIA/SEDATION: Moderate (conscious) sedation was employed during this procedure. A total of Versed 2 mg and Fentanyl 100 mcg was administered intravenously. Moderate Sedation Time: 20 minutes. The patient's level of consciousness and vital signs were monitored continuously by radiology nursing throughout the procedure under my direct supervision. FLUOROSCOPY TIME:  Fluoroscopic dose; 0 mGy COMPLICATIONS: None immediate. PROCEDURE: The procedure, risks, benefits, and alternatives were explained to the patient. Questions regarding the procedure were encouraged and answered. The patient understands and consents to the procedure. The RIGHT neck and chest were prepped with chlorhexidine in a sterile fashion, and a sterile drape was applied covering the operative field. Maximum barrier sterile technique with sterile gowns and gloves were used for the procedure. A timeout was performed prior to the initiation of the procedure. Local anesthesia was provided with 1% lidocaine with epinephrine. After creating a small venotomy incision, a micropuncture kit was utilized to access the internal jugular vein under direct, real-time ultrasound guidance. Ultrasound image documentation was performed. The microwire was kinked to measure appropriate catheter length. A subcutaneous port pocket was then  created along the upper chest wall utilizing a combination of sharp and blunt dissection. The pocket was irrigated with sterile saline. A single lumen Non-ISP power injectable port was chosen for placement. The 8 Fr catheter was tunneled from the port pocket site to the venotomy incision. The port was placed in the pocket. The external catheter was trimmed to appropriate length. At the venotomy, an 8 Fr peel-away sheath was placed over a guidewire under fluoroscopic guidance. The catheter was then placed through the sheath and the sheath was removed. Final catheter positioning was confirmed and documented with a fluoroscopic spot radiograph. The port was accessed with a Huber needle, aspirated and flushed with heparinized saline. The port pocket incision was closed with interrupted 3-0 Vicryl suture then Dermabond was applied, including at the venotomy incision. Dressings were placed. The patient tolerated the procedure well without immediate post procedural complication. IMPRESSION: Successful placement of a RIGHT internal jugular approach power injectable Port-A-Cath. The tip of the catheter is positioned within the proximal RIGHT atrium. The catheter is ready for immediate use. Roanna Banning, MD [facility]iology Special[facility] Radiology Electronically Signed   By: Roanna Banning M.D.   On: 09/20/2022 14:44   ECHOCARDIOGRAM COMPLETE  Result Date: 09/20/2022    ECHOCARDIOGRAM REPORT   Patient Name:   HIPOLITO ALLTOP Date of Exam: 09/20/2022 Medical Rec #:  [id number]           Height:       68.0 in Accession #:    [id number]          Weight:  177.9 lb Date of Birth:  1955-10-26            BSA:          1.945 m Patient Age:    66 years            BP:           100/63 mmHg Patient Gender: M                   HR:           80 bpm. Exam Location:  Inpatient Procedure: 2D Echo, Cardiac Doppler and Color Doppler Indications:    Chemo  History:        Patient has no prior history of  Echocardiogram examinations.  Sonographer:    Lucy Antigua Referring Phys: [id number] [name]  Sonographer Comments: Strain was attempted. Echo machine @ [facility] doesn't have strain Neurosurgeon. IMPRESSIONS  1. Left ventricular ejection fraction, by estimation, is 55%. The left ventricle has low normal function. The left ventricle has no regional wall motion abnormalities.  2. Right ventricular systolic function is normal. The right ventricular size is normal. There is normal pulmonary artery systolic pressure.  3. The mitral valve is normal in structure. Trivial mitral valve regurgitation. No evidence of mitral stenosis.  4. The aortic valve is normal in structure. Aortic valve regurgitation is not visualized. No aortic stenosis is present.  5. The inferior vena cava is normal in size with greater than 50% respiratory variability, suggesting right atrial pressure of 3 mmHg. FINDINGS  Left Ventricle: Left ventricular ejection fraction, by estimation, is 50 to 55%. The left ventricle has low normal function. The left ventricle has no regional wall motion abnormalities. The left ventricular internal cavity size was normal in size. There is borderline left ventricular hypertrophy. Left ventricular diastolic parameters were normal. Right Ventricle: The right ventricular size is normal. No increase in right ventricular wall thickness. Right ventricular systolic function is normal. There is normal pulmonary artery systolic pressure. The tricuspid regurgitant velocity is 1.94 m/s, and  with an assumed right atrial pressure of 3 mmHg, the estimated right ventricular systolic pressure is 18.1 mmHg. Left Atrium: Left atrial size was normal in size. Right Atrium: Right atrial size was normal in size. Pericardium: There is no evidence of pericardial effusion. Mitral Valve: The mitral valve is normal in structure. Trivial mitral valve regurgitation. No evidence of mitral valve stenosis. Tricuspid Valve: The tricuspid valve  is normal in structure. Tricuspid valve regurgitation is trivial. No evidence of tricuspid stenosis. Aortic Valve: The aortic valve is normal in structure. Aortic valve regurgitation is not visualized. No aortic stenosis is present. Aortic valve mean gradient measures 2.0 mmHg. Aortic valve peak gradient measures 3.6 mmHg. Aortic valve area, by VTI measures 3.80 cm. Pulmonic Valve: The pulmonic valve was normal in structure. Pulmonic valve regurgitation is not visualized. No evidence of pulmonic stenosis. Aorta: The aortic root is normal in size and structure. Venous: The inferior vena cava is normal in size with greater than 50% respiratory variability, suggesting right atrial pressure of 3 mmHg. IAS/Shunts: No atrial level shunt detected by color flow Doppler.  LEFT VENTRICLE PLAX 2D LVIDd:         3.60 cm   Diastology LVIDs:         2.70 cm   LV e' medial:    10.00 cm/s LV PW:         1.20 cm   LV E/e' medial:  4.6 LV IVS:        1.00 cm   LV e' lateral:   9.36 cm/s LVOT diam:     2.20 cm   LV E/e' lateral: 4.9 LV SV:         74 LV SV Index:   38 LVOT Area:     3.80 cm  RIGHT VENTRICLE RV S prime:     10.90 cm/s TAPSE (M-mode): 1.5 cm LEFT ATRIUM             Index        RIGHT ATRIUM           Index LA Vol (A2C):   61.4 ml 31.58 ml/m  RA Area:     13.90 cm LA Vol (A4C):   58.4 ml 30.03 ml/m  RA Volume:   32.80 ml  16.87 ml/m LA Biplane Vol: 59.6 ml 30.65 ml/m  AORTIC VALVE AV Area (Vmax):    3.46 cm AV Area (Vmean):   3.17 cm AV Area (VTI):     3.80 cm AV Vmax:           94.70 cm/s AV Vmean:          64.800 cm/s AV VTI:            0.195 m AV Peak Grad:      3.6 mmHg AV Mean Grad:      2.0 mmHg LVOT Vmax:         86.20 cm/s LVOT Vmean:        54.000 cm/s LVOT VTI:          0.195 m LVOT/AV VTI ratio: 1.00  AORTA Ao Root diam: 2.90 cm Ao Asc diam:  3.60 cm MITRAL VALVE               TRICUSPID VALVE MV Area (PHT): 3.08 cm    TR Peak grad:   15.1 mmHg MV Decel Time: 246 msec    TR Vmax:        194.00 cm/s  MR Peak grad: 32.5 mmHg MR Vmax:      285.00 cm/s  SHUNTS MV E velocity: 46.30 cm/s  Systemic VTI:  0.20 m MV A velocity: 61.30 cm/s  Systemic Diam: 2.20 cm MV E/A ratio:  0.76 Aditya Sabharwal Electronically signed by Dorthula Nettles Signature Date/Time: 09/20/2022/1:15:18 PM    Final    CT ABDOMEN PELVIS W CONTRAST  Result Date: 09/18/2022 CLINICAL DATA:  Colon cancer, anorexia, weakness, fever EXAM: CT ABDOMEN AND PELVIS WITH CONTRAST TECHNIQUE: Multidetector CT imaging of the abdomen and pelvis was performed using the standard protocol following bolus administration of intravenous contrast. RADIATION DOSE REDUCTION: This exam was performed according to the departmental dose-optimization program which includes automated exposure control, adjustment of the mA and/or kV according to patient size and/or use of iterative reconstruction technique. CONTRAST:  OMNIPAQUE IOHEXOL 300 MG/ML  SOLN COMPARISON:  08/12/2022 FINDINGS: Lower chest: There are 2 left lower lobe pulmonary nodules consistent with progressive pulmonary metastases. 1.4 cm nodule image 12/2 and a 0.8 cm nodule image 15/2. No airspace disease or effusion. Hepatobiliary: Gallbladder is moderately distended without cholelithiasis or cholecystitis. Stable cysts are seen within the liver. Since the prior exam, there is loss of normal fat plane between the inferior right lobe liver and and enlarging colonic mass at the hepatic flexure. Direct invasion of the liver cannot be excluded. Pancreas: Unremarkable. No pancreatic ductal dilatation or surrounding inflammatory changes. Spleen: Normal in size without focal  abnormality. Adrenals/Urinary Tract: Bilateral adrenal masses seen previously have enlarged, consistent with metastatic disease. Right adrenal mass measures up to 5.1 cm, previously 3.5 cm. The left adrenal mass measures up to 4.7 cm, previously 3.5 cm. The kidneys enhance normally and symmetrically. No urinary tract calculi or obstructive  uropathy. Bladder is unremarkable. Stomach/Bowel: A large necrotic mass at the hepatic flexure of the colon has increased in size, measuring up to 12.5 x 10.1 cm. There is increasing pericolonic fat stranding and mesenteric nodularity consistent with locally invasive disease. As discussed above, there is loss of normal fat plane between the hepatic mass and the inferior margin right lobe liver and direct invasion of the liver cannot be excluded. There is no evidence of bowel obstruction or ileus. Normal appendix right lower quadrant. Vascular/Lymphatic: Increase in the size and number of mesenteric lymph nodes surrounding the hepatic flexure mass, consistent with local nodal metastases. Largest lymph node measures 0.8 cm reference image 56/2. There is also a soft tissue mass contiguous with the left crus of the diaphragm, which appears separate from the enlarging left adrenal mass. This measures 3.5 x 3.0 cm, new since prior study and consistent with metastatic deposit. Stable atherosclerosis of the aorta and its branches. Reproductive: Prostate is unremarkable. Other: Trace pelvic free fluid. No free intraperitoneal gas. No abdominal wall hernia. Musculoskeletal: There are no acute or destructive bony abnormalities. Reconstructed images demonstrate no additional findings. IMPRESSION: 1. Enlarging mass at the hepatic flexure of the colon consistent with progressive colonic malignancy. There is increasing pericolonic fat stranding with loss of fat plane between the mass and the right lobe liver concerning for direct hepatic involvement. 2. Increasing mesenteric adenopathy, new soft tissue mass along the crus of the left hemidiaphragm, and enlarging bilateral adrenal masses consistent with progressive intra-abdominal metastases. 3. New and enlarging left lower lobe pulmonary nodules consistent with progressive pulmonary metastatic disease. 4. Trace pelvic free fluid. 5.  Aortic Atherosclerosis (ICD10-I70.0).  Electronically Signed   By: Sharlet Salina M.D.   On: 09/18/2022 18:46   DG Chest Port 1 View  Result Date: 09/18/2022 CLINICAL DATA:  Fever and vomiting EXAM: PORTABLE CHEST 1 VIEW COMPARISON:  Radiograph 09/04/2016 and CT chest 08/22/2022 FINDINGS: Stable cardiomediastinal silhouette. Aortic atherosclerotic calcification. No focal consolidation, pleural effusion, or pneumothorax. No displaced rib fractures. IMPRESSION: No active disease. Electronically Signed   By: Minerva Fester M.D.   On: 09/18/2022 17:37    ASSESSMENT & PLAN:   67 y.o. male with:  Large bowel perforation from large right hepatic flexure colonic tumor from diffuse large B-cell lymphoma patient is status post right hemicolectomy and IVC filter placement currently on TPN status post extubation. Newly diagnosed stage IVb large B-cell lymphoma ECHo done today showed normal EF of 55% -port a cath placed by IR -Hepatitis BC and HIV testing negative Large hepatic flexure mass from large B-cell lymphoma with impending obstruction and high risk for perforation.  Possible infection of the necrotic tissue in the tumor or bowel wall currently on antibiotics. 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  Patient is a lightheadedness and some orthostatic symptoms and severe fatigue.  Rule out adrenal insufficiency. Failure to thrive due to poor p.o. intake bowel issues and new diagnosis of large B-cell lymphoma. Dehydration due to poor p.o. intake Significant abdominal discomfort from his large colonic mass with impending obstruction. Arthritis Dyslipidemia History of gout 11.  History of obstructive  sleep apnea not using CPAP at home. 12. Concern for CNS lymphoma vs CVA 13.  Acute right peroneal vein and right posterior tibial vein and left peroneal vein acute DVT on prophylactic anticoagulation and IVC filter in  place. PLAN: -Met with patient and his wife at bedside.   -MRI brain was done which was discussed in detail with the patient and his wife at bedside and other family members.  Findings are somewhat limited to 1 vascular territory and bring up a higher concern for possible CVA but lymphoma cannot be ruled out.. -Neurology has been consulted and patient might likely need a diagnostic lumbar puncture. -Continues to be in antibiotics for his abdomen.  No overt fevers. -Will hold off on intrathecal methotrexate at this time pending neurology input but would still recommend proceeding with a diagnostic LP.  Since IR and radiology could not accommodate this hopefully neurology can help with this. -Ongoing goals of care discussion.  Spent a lot of time with the patient and his wife and family and at this time they want to continue to evaluate this completely and try to find a way forward. -We discussed that if his neurological status is reasonably stable with no significant neurological deficits depending on neurology input we still might have a way forward and would consider doing Rituxan on Thursday and if he tolerated that then potentially chemotherapy next week. -The patient and wife know this is a very difficult path and if there is any significant decompensation that might completely change our goals of care again. -Case was discussed in detail with Dr. Ophelia Charter.  Radiology/IR and neurology are aware of the patient. -Surgery continues to follow. -Patient is currently off oxygen when examined.  .The total time spent in the appointment was 50 minutes* .  All of the patient's questions were answered with apparent satisfaction. The patient knows to call the clinic with any problems, questions or concerns.   Wyvonnia Lora MD MS AAHIVMS [facility] Hematology/Oncology Physician [facility]  .*Total Encounter Time as defined by the Centers for Medicare and Medicaid Services includes, in addition  to the face-to-face time of a patient visit (documented in the note above) non-face-to-face time: obtaining and reviewing outside history, ordering and reviewing medications, tests or procedures, care coordination (communications with other health care professionals or caregivers) and documentation in the medical record.

## 2022-10-09 NOTE — TOC Progression Note (Signed)
Transition of Care [facility]) - Progression Note    Patient Details  Name: Justin Cameron MRN: [id number] Date of Birth: 1956-02-08  Transition of Care [facility]) CM/SW Contact  Larrie Kass, LCSW Phone Number: 10/09/2022, 2:23 PM  Clinical Narrative:    CSW attempted to speak with pt's spouse, regarding SNF recommendations, no answer left HIPAA complaint VM requesting return call. TOC to follow.    Expected Discharge Plan: Home/Self Care    Expected Discharge Plan and Services                                               Social Determinants of Health (SDOH) Interventions SDOH Screenings   Food Insecurity: No Food Insecurity (09/23/2022)  Housing: Low Risk  (09/23/2022)  Transportation Needs: No Transportation Needs (09/23/2022)  Utilities: Not At Risk (09/23/2022)  Tobacco Use: Low Risk  (09/25/2022)    Readmission Risk Interventions    09/20/2022    1:26 PM  Readmission Risk Prevention Plan  Transportation Screening Complete  PCP or Specialist Appt within 3-5 Days Complete  HRI or Home Care Consult Complete  Social Work Consult for Recovery Care Planning/Counseling Complete  Palliative Care Screening Not Applicable  Medication Review [employer]) Complete

## 2022-10-09 NOTE — Progress Notes (Signed)
Speech Language Pathology Treatment: Dysphagia  Patient Details Name: Justin Cameron MRN: [id number] DOB: 03/07/1956 Today's Date: 10/09/2022 Time: 7829-5621 SLP Time Calculation (min) (ACUTE ONLY): 31 min  Assessment / Plan / Recommendation Clinical Impression  Pt seen today for readiness for po and to assess tolerance. Pt awake on the telephone with his wife in the room.  His voice is strong- near baseline per pt/wife.  Does report odynophagia - on the right side feels like a "wood chip" when he swallows.  Observed pt consuming Gatorade, Water, Jello and single bolus of Ensure.  Swallow was timely with clear voice throughout and pt takes small bites/sips without cues.  "Mastication" of jello was rotary in nature and effective.   Pt does tend to swallow remainder of liquid with mouth open.  SLP advised pt assure adequate lip closure for propulsive pressure to transit boluses into pharynx.   No SLP follow up indicated as educated pt/spouse to recommendations - appropriate labial seal, start intake with liquids, stop and rest if dyspneic, etc.  Recommend advance diet as surgery indicates.  Thanks for allowing this SLP to assist with pt's care.       HPI HPI: Patient is a 67 y.o. male with PMH: OSA,  newly diagnosed diffuse large B-cell lymphoma who initially presented to [facility] medical oncology clinic with complaints of intermittent and progressive abdominal discomfort, generalized fatigue, night sweats, poor oral intake, unintentional weight loss of 30 pounds since February 2024. He presented to [facility] ED on 09/18/22 with c/o night sweats and worsening abdominal pain, nausea and lightheadedness. IV contrast CT scan of the abdomen and pelvis revealed the following findings: Enlarging mass at the hepatic flexure increased pericolonic fat stranding with loss of fat plane between the mass in the right lobe of the liver.  Increasing mesenteric lymphadenopathy and new soft tissue mass along the crus of the  left diaphragm and enlarging bilateral adrenal masses consistent with progressive intra-abdominal metastases.  New and enlarging left lower lobe pulmonary nodules consistent with progressive metastatic pulmonary involvement.  On 5/28, he underwent ex-lap and remained intubated following surgery, started on TPN. He has NG for drainage. He was extubated 5/29. CT drain placed in IR on 6/3 and he became tachycardic and hypotensive following drain placement, was intubated and transferred back to ICU. On 6/6, GOC discussion with family and code status changed to DNR and patient was extubated.  SLP evaluated him on Monday, 6/10 and it was recommended for him to have ice chips and water.  Pt has started on a clear liquid diet and has been tolerating.      SLP Plan  All goals met      Recommendations for follow up therapy are one component of a multi-disciplinary discharge planning process, led by the attending physician.  Recommendations may be updated based on patient status, additional functional criteria and insurance authorization.    Recommendations  Diet recommendations: Other(comment) (advance diet as surgery indicates) Liquids provided via: Cup;Straw Medication Administration: Other (Comment) (as tolerated) Supervision: Patient able to self feed (assist with feeding using spoon due to tube in place) Compensations: Slow rate;Small sips/bites Postural Changes and/or Swallow Maneuvers: Seated upright 90 degrees;Upright 30-60 min after meal                        Dysphagia, unspecified (R13.10)     All goals met   Rolena Infante, MS [facility] SLP Acute Rehab Services Office [phone]   Donavan Burnet  Ann  10/09/2022, 11:41 AM

## 2022-10-09 NOTE — Progress Notes (Signed)
Assessment & Plan: POD#15 - s/p ex lap with right colectomy and end ileostomy - TC B cell lymphoma  - CLD, OK to advance to full liquids vs start TF's for nutritional support - continue wound care - discussed with ICU nurse and WOC nurse this AM - IR drain - repeat CT timing per IR   FEN - TPN, CLD VTE - lovenox IVC filter ID - rocephin   Discussion started this morning by Dr. Robb Matar between disciplines much appreciated.  I met with wife at bedside to discuss surgical management of wound.  I believe starting chemo would markedly elevate the risk of wound complications, non-healing, and development of fistulae.  Plan at present is nutritional support with TNA, po diet and/or TF's, wound care with frequent dressing changes, abdominal binder.  Concerned about possible development of fistulae.  I reviewed this with the patient and his wife.        [name], MD [facility] practice Office: [phone]        Chief Complaint: Colonic perforation secondary to B cell lymphoma  Subjective: Patient in bed, awake, comfortable.  Wife at bedside.  Objective: Vital signs in last 24 hours: Temp:  [98.2 F (36.8 C)-99.8 F (37.7 C)] 99.8 F (37.7 C) (06/12 0400) Pulse Rate:  [79-109] 100 (06/12 0700) Resp:  [13-30] 17 (06/12 0700) BP: (111-152)/(46-72) 121/46 (06/12 0700) SpO2:  [92 %-98 %] 94 % (06/12 0700) Weight:  [90.5 kg] 90.5 kg (06/12 0500) Last BM Date : 10/08/22  Intake/Output from previous day: 06/11 0701 - 06/12 0700 In: 2725.3 [I.V.:2410.3; NG/GT:65; IV Piggyback:249.9] Out: 2545 [Urine:1250; Drains:40; Stool:1255] Intake/Output this shift: No intake/output data recorded.  Physical Exam: HEENT - sclerae clear, mucous membranes moist Abdomen - binder on with some staining, dressing in place; ostomy with large liquid succus in bag  Lab Results:  Recent Labs    10/08/22 0212 10/09/22 0547  WBC 10.5 8.6  HGB 8.7* 9.1*  HCT 28.2* 29.8*   PLT 257 235   BMET Recent Labs    10/08/22 0212 10/09/22 0547  NA 144 136  K 3.0* 3.3*  CL 112* 105  CO2 24 22  GLUCOSE 104* 92  BUN 30* 23  CREATININE 0.51* 0.52*  CALCIUM 8.0* 7.9*   PT/INR No results for input(s): "LABPROT", "INR" in the last 72 hours. Comprehensive Metabolic Panel:    Component Value Date/Time   NA 136 10/09/2022 0547   NA 144 10/08/2022 0212   K 3.3 (L) 10/09/2022 0547   K 3.0 (L) 10/08/2022 0212   CL 105 10/09/2022 0547   CL 112 (H) 10/08/2022 0212   CO2 22 10/09/2022 0547   CO2 24 10/08/2022 0212   BUN 23 10/09/2022 0547   BUN 30 (H) 10/08/2022 0212   CREATININE 0.52 (L) 10/09/2022 0547   CREATININE 0.51 (L) 10/08/2022 0212   GLUCOSE 92 10/09/2022 0547   GLUCOSE 104 (H) 10/08/2022 0212   CALCIUM 7.9 (L) 10/09/2022 0547   CALCIUM 8.0 (L) 10/08/2022 0212   AST 25 10/07/2022 0615   AST 18 10/03/2022 0702   ALT 19 10/07/2022 0615   ALT 13 10/03/2022 0702   ALKPHOS 116 10/07/2022 0615   ALKPHOS 122 10/03/2022 0702   BILITOT 1.1 10/07/2022 0615   BILITOT 0.8 10/03/2022 0702   PROT 5.0 (L) 10/07/2022 0615   PROT 5.2 (L) 10/03/2022 0702   ALBUMIN 1.9 (L) 10/07/2022 0615   ALBUMIN 2.1 (L) 10/03/2022 0702    Studies/Results:  MR BRAIN W WO CONTRAST  Result Date: 10/08/2022 CLINICAL DATA:  Concern for CNS involvement from high-grade lymphoma EXAM: MRI HEAD WITHOUT AND WITH CONTRAST TECHNIQUE: Multiplanar, multiecho pulse sequences of the brain and surrounding structures were obtained without and with intravenous contrast. CONTRAST:  9mL GADAVIST GADOBUTROL 1 MMOL/ML IV SOLN COMPARISON:  No prior MRI available, correlation is made with CT head 610 2024 FINDINGS: Brain: Gyral enhancement in a sharply demarcated, contiguous area involving the right temporal lobe, occipital lobe, and inferior parietal lobe. These areas are associated with increased T2 hyperintense signal and gyral swelling, as well as hemosiderin deposition, likely petechial hemorrhage.  Some areas of cortex to have increased signal on diffusion-weighted imaging with ADC correlates (for example series 5, image 19 and series 6, image 19), which may be related to susceptibility. Mild mass effect on the right occipital and temporal horns of the lateral ventricle, but no significant midline shift. No other restricted diffusion to suggest acute or subacute infarct. No other parenchymal hemorrhage or midline shift. No hydrocephalus or extra-axial collection. Vascular: Normal arterial flow voids. Normal arterial and venous enhancement. Skull and upper cervical spine: Normal marrow signal. Sinuses/Orbits: Clear paranasal sinuses. No acute finding in the orbits. Other: The mastoid air cells are well aerated. IMPRESSION: Gyral enhancement in a sharply demarcated, contiguous area involving the right temporal lobe, occipital lobe, and inferior parietal lobe, with associated gyral swelling and hemosiderin deposition, likely petechial hemorrhage. This appears localized to the posterior MCA territory, and is favored to represent a subacute infarct, although lymphomatous involvement could appear similar. These results will be called to the ordering clinician or representative by the Radiologist Assistant, and communication documented in the PACS or [employer]. Electronically Signed   By: Wiliam Ke M.D.   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 EXAM: CT HEAD WITHOUT AND WITH CONTRAST TECHNIQUE: Contiguous axial images were obtained from the base of the skull through the vertex without and with intravenous contrast. RADIATION DOSE REDUCTION: This exam was performed according to the departmental dose-optimization program which includes automated exposure control, adjustment of the mA and/or kV according to patient size and/or use of iterative reconstruction technique. CONTRAST:  75mL OMNIPAQUE  IOHEXOL 300 MG/ML  SOLN COMPARISON:  None Available. FINDINGS: Brain: Leptomeningeal and gyral enhancement along the posterior right temporal lobe, extending into the inferior parietal and occipital lobes with subjacent hypoattenuation of the gray and white matter, suspicious for CNS involvement of lymphoma. No significant mass effect or midline shift. No acute hemorrhage. No hydrocephalus or extra-axial collection. Vascular: No hyperdense vessel or unexpected calcification. Visible vessels are patent. Skull: No calvarial fracture or suspicious bone lesion. Skull base is unremarkable. Sinuses/Orbits: Unremarkable. Other: None. IMPRESSION: Leptomeningeal and gyral enhancement along the posterior right temporal lobe, extending into the inferior right parietal and occipital lobes with subjacent hypoattenuation of the gray and white matter, suspicious for CNS involvement of lymphoma. No significant mass effect or midline shift. Electronically Signed   By: Orvan Falconer M.D.   On: 10/07/2022 12:57      [name] 10/09/2022  Patient ID: Justin Cameron, male   DOB: [birth year], 67 y.o.   MRN: [id number]

## 2022-10-09 NOTE — Progress Notes (Signed)
PT Cancellation Note  Patient Details Name: Justin Cameron MRN: [id number] DOB: 1956/02/23   Cancelled Treatment:    Reason Eval/Treat Not Completed: Fatigue/lethargy limiting ability to participate  Nursing reported earlier hopeful to get pt to chair to take outside.  Arrived for PT treatment and hopeful transfer to chair; however, pt lethargic and falling asleep.  He requested to rest at this time.  Will f/u later date as able.  Anise Salvo, PT Acute Rehab [facility] [phone]  Rayetta Humphrey 10/09/2022, 12:22 PM

## 2022-10-09 NOTE — Progress Notes (Signed)
PHARMACY - TOTAL PARENTERAL NUTRITION CONSULT NOTE   Indication: Prolonged ileus  Patient Measurements: Height: 5\' 8"  (172.7 cm) Weight: 90.5 kg (199 lb 8.3 oz) IBW/kg (Calculated) : 68.4 TPN AdjBW (KG): 81 Body mass index is 30.34 kg/m.  Assessment: 67 yoM admitted on 5/23 with abdominal pain related to newly diagnosed Stage IV large B cell lymphoma with a large hepatic flexure mass, partial large bowel obstruction.  He was supposed to start chemo, but developed severe acute abdominal pain on 5/28.  NG tube placed, imaging to confirm placement shows possible free air and he was taken to OR on 5/28. Pharmacy consulted to dose TPN for prolonged ileus.    CT with increased fluid collection in right abdomen around surgical drain. IR placed drain 6/3. Patient later intubated and started on vasopressors. Patient with extravascular volume, given albumin 6/4. TPN concentrated 6/4 in attempt to reduce fluid burden. Patient remains intubated but with reduced vasopressor requirements on 6/5. Fever curve and WBC improved. Patient extubated 6/6 and off of vasopressors. Tube feeds started at trickle 6/6, but patient with episode of vomiting 6/7 am and tube feeds subsequently held.  Glucose / Insulin: No hx DM, no PTA medications. Tapering steroids. CBGs at goal < 150 and stable. Sensitive SSI: None required/24 hrs Electrolytes: K (3.3) slightly low.  All other electrolytes WNL including CorrCa (9.6) -Cl, bicarb WNL Renal: SCr low, BUN back to WNL today Hepatic: AST/ALT, Tbili, Alk Phos WNL. TG wnl. Albumin low at 1.9. Intake / Output: 24 hr - Drains 40 mL (decreased significantly) - Urine 1250 mL - ostomy: 1255 mL GI Imaging:  -6/2 CT abd/pelvis - large irregular fluid collection in the right abdomen lies in the location of the previous seen large necrotic mass -5/28  Lucency in the right upper quadrant was not seen on the previous images. Free intraperitoneal air is not excluded.  6/9 Abd xray shows  no obstructive gas pattern. 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  - 6/3 IR drainage catheter placed into posterior pelvic fluid collection  Central access: Port (09/20/22), Triple lumen PICC placed 5/29 TPN start date: 5/29  Nutritional Goals: RD Assessment: updated 6/7 Estimated Needs Total Energy Estimated Needs: 2250-2450 kcals Total Protein Estimated Needs: 120-140 grams Total Fluid Estimated Needs: >/= 2.2L  Current Nutrition:  TPN TFs remain on hold Prosource TF20 BID per tube (40g of protein and 160 kcal daily) CLD  Significant Events: -6/11: D10 infusion ordered per MD while TPN disconnected for MRI  Plan:  Now: Resume Prosource BID (discussed with TRH) KCl 10 mEq IV x 4 runs  At 1800: Continue TPN at 80 mL/hr Provides 100g of protein and 2188 kcal per day Reducing goal rate back to 80 mL/hr and total protein/kcal in TPN since Prosource resumed Electrolytes in TPN: Increase K  Na 25 mEq/L, K 75 mEq/L, Ca 3 mEq/L, Mg 5 mEq/L, and Phos 20 mmol/L.  Cl:Ac 1:1 Add standard MVI and trace elements to TPN Continue sensitive SSI q6h and adjust as needed  Monitor TPN labs on Mon/Thurs  Cindi Carbon, PharmD, BCPS 10/09/22 10:31 AM

## 2022-10-09 NOTE — Progress Notes (Signed)
Referring Physician(s): Darnelle Spangle  Supervising Physician: Richarda Overlie  Patient Status:  [facility] - In-pt  Chief Complaint:  Abdomino-pelvic abscess/lymphoma   Subjective: Pt remains lethargic with confusion   Allergies: Patient has no allergy information on record.  Medications: Prior to Admission medications   Medication Sig Start Date End Date Taking? Authorizing Provider  ALPRAZolam (XANAX) 0.25 MG tablet Take 0.125-0.25 mg by mouth 3 (three) times daily as needed for anxiety.   Yes [provider]  colchicine 0.6 MG tablet Take 0.6 mg by mouth daily as needed (gout). 07/19/22  Yes [provider]  diphenhydramine-acetaminophen (TYLENOL PM) 25-500 MG TABS tablet Take 1 tablet by mouth at bedtime.   Yes [provider]  diphenhydrAMINE-zinc acetate (BENADRYL EXTRA STRENGTH) cream Apply 1 Application topically 3 (three) times daily as needed for itching. 09/18/22  Yes Charlynne Pander, MD  ondansetron (ZOFRAN) 4 MG tablet TAKE 1 TABLET(4 MG) BY MOUTH EVERY 8 HOURS AS NEEDED FOR NAUSEA OR VOMITING 08/19/22  Yes Jenel Lucks, MD  simvastatin (ZOCOR) 20 MG tablet Take 20 mg by mouth every evening.   Yes [provider]  traZODone (DESYREL) 50 MG tablet Take 50-100 mg by mouth at bedtime as needed for sleep. 07/26/22  Yes [provider]  amoxicillin-clavulanate (AUGMENTIN) 875-125 MG tablet Take 1 tablet by mouth every 12 (twelve) hours. 09/18/22   Charlynne Pander, MD  dicyclomine (BENTYL) 20 MG tablet TAKE 1 TABLET(20 MG) BY MOUTH EVERY 6 HOURS Patient not taking: Reported on 09/12/2022 08/19/22   Jenel Lucks, MD     Vital Signs: BP 111/63 (BP Location: Left Arm)   Pulse 95   Temp 98.5 F (36.9 C) (Oral)   Resp 16   Ht 5\' 8"  (1.727 m)   Wt 199 lb 8.3 oz (90.5 kg)   SpO2 93%   BMI 30.34 kg/m   Physical Exam awake but lethargic; rt TG drain intact, minimal amount blood-tinged fluid in JP, flushes  continue  Imaging: MR BRAIN W WO CONTRAST  Result Date: 10/08/2022 CLINICAL DATA:  Concern for CNS involvement from high-grade lymphoma EXAM: MRI HEAD WITHOUT AND WITH CONTRAST TECHNIQUE: Multiplanar, multiecho pulse sequences of the brain and surrounding structures were obtained without and with intravenous contrast. CONTRAST:  9mL GADAVIST GADOBUTROL 1 MMOL/ML IV SOLN COMPARISON:  No prior MRI available, correlation is made with CT head 610 2024 FINDINGS: Brain: Gyral enhancement in a sharply demarcated, contiguous area involving the right temporal lobe, occipital lobe, and inferior parietal lobe. These areas are associated with increased T2 hyperintense signal and gyral swelling, as well as hemosiderin deposition, likely petechial hemorrhage. Some areas of cortex to have increased signal on diffusion-weighted imaging with ADC correlates (for example series 5, image 19 and series 6, image 19), which may be related to susceptibility. Mild mass effect on the right occipital and temporal horns of the lateral ventricle, but no significant midline shift. No other restricted diffusion to suggest acute or subacute infarct. No other parenchymal hemorrhage or midline shift. No hydrocephalus or extra-axial collection. Vascular: Normal arterial flow voids. Normal arterial and venous enhancement. Skull and upper cervical spine: Normal marrow signal. Sinuses/Orbits: Clear paranasal sinuses. No acute finding in the orbits. Other: The mastoid air cells are well aerated. IMPRESSION: Gyral enhancement in a sharply demarcated, contiguous area involving the right temporal lobe, occipital lobe, and inferior parietal lobe, with associated gyral swelling and hemosiderin deposition, likely petechial hemorrhage. This appears localized to the posterior MCA territory, and  is favored to represent a subacute infarct, although lymphomatous involvement could appear similar. These results will be called to the ordering clinician or  representative by the Radiologist Assistant, and communication documented in the PACS or [employer]. Electronically Signed   By: Wiliam Ke M.D.   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 EXAM: CT HEAD WITHOUT AND WITH CONTRAST TECHNIQUE: Contiguous axial images were obtained from the base of the skull through the vertex without and with intravenous contrast. RADIATION DOSE REDUCTION: This exam was performed according to the departmental dose-optimization program which includes automated exposure control, adjustment of the mA and/or kV according to patient size and/or use of iterative reconstruction technique. CONTRAST:  75mL OMNIPAQUE IOHEXOL 300 MG/ML  SOLN COMPARISON:  None Available. FINDINGS: Brain: Leptomeningeal and gyral enhancement along the posterior right temporal lobe, extending into the inferior parietal and occipital lobes with subjacent hypoattenuation of the gray and white matter, suspicious for CNS involvement of lymphoma. No significant mass effect or midline shift. No acute hemorrhage. No hydrocephalus or extra-axial collection. Vascular: No hyperdense vessel or unexpected calcification. Visible vessels are patent. Skull: No calvarial fracture or suspicious bone lesion. Skull base is unremarkable. Sinuses/Orbits: Unremarkable. Other: None. IMPRESSION: Leptomeningeal and gyral enhancement along the posterior right temporal lobe, extending into the inferior right parietal and occipital lobes with subjacent hypoattenuation of the gray and white matter, suspicious for CNS involvement of lymphoma. No significant mass effect or midline shift. Electronically Signed   By: Orvan Falconer M.D.   On: 10/07/2022 12:57   DG Abd 1 View  Result Date: 10/06/2022 CLINICAL DATA:  Inpatient, abdominal pain, small-bowel obstruction EXAM: ABDOMEN - 1 VIEW COMPARISON:  10/03/2022 abdominal  radiograph FINDINGS: IVC filter again noted in the medial right abdomen. Transgluteal right pelvic pigtail catheter overlies the midline lower sacrum. Surgical drain overlies the right abdomen. Right lower quadrant ostomy. No disproportionately dilated small bowel loops. Decreased mild to moderate stool in the remnant colon. No evidence of pneumatosis or pneumoperitoneum on this supine view. Marked lumbar spondylosis. IMPRESSION: Nonobstructive bowel gas pattern. Decreased mild to moderate stool in the remnant colon. Electronically Signed   By: Delbert Phenix M.D.   On: 10/06/2022 16:13    Labs:  CBC: Recent Labs    10/06/22 0305 10/07/22 0615 10/08/22 0212 10/09/22 0547  WBC 13.6* 11.9* 10.5 8.6  HGB 8.8* 9.2* 8.7* 9.1*  HCT 28.3* 30.4* 28.2* 29.8*  PLT 293 291 257 235    COAGS: Recent Labs    09/13/2022 1713 09/30/22 1825  INR 1.1 1.2  APTT 29 27    BMP: Recent Labs    10/01/22 0414 10/01/22 0530 10/06/22 0305 10/07/22 0615 10/08/22 0212 10/09/22 0547  NA SPECIMEN CONTAMINATED, UNABLE TO PERFORM TEST(S).   < > 147* 145 144 136  K SPECIMEN CONTAMINATED, UNABLE TO PERFORM TEST(S).   < > 3.5 3.4* 3.0* 3.3*  CL SPECIMEN CONTAMINATED, UNABLE TO PERFORM TEST(S).   < > 114* 113* 112* 105  CO2 SPECIMEN CONTAMINATED, UNABLE TO PERFORM TEST(S).   < > 25 25 24 22   GLUCOSE SPECIMEN CONTAMINATED, UNABLE TO PERFORM TEST(S).   < > 125* 103* 104* 92  BUN SPECIMEN CONTAMINATED, UNABLE TO PERFORM TEST(S).   < > 28* 28* 30* 23  CALCIUM SPECIMEN CONTAMINATED, UNABLE TO PERFORM TEST(S).   < > 8.1* 8.1* 8.0* 7.9*  CREATININE SPECIMEN CONTAMINATED, UNABLE TO  PERFORM TEST(S).   < > 0.50* 0.57* 0.51* 0.52*  GFRNONAA SPECIMEN CONTAMINATED, UNABLE TO PERFORM TEST(S).   < > >60 >60 >60 >60  GFRAA SPECIMEN CONTAMINATED, UNABLE TO PERFORM TEST(S).  --   --   --   --   --    < > = values in this interval not displayed.    LIVER FUNCTION TESTS: Recent Labs    10/01/22 0530 10/02/22 0956  10/03/22 0702 10/07/22 0615  BILITOT 0.9 1.2 0.8 1.1  AST 27 21 18 25   ALT 13 11 13 19   ALKPHOS 113 90 122 116  PROT 4.2* 4.9* 5.2* 5.0*  ALBUMIN 1.6* 2.1* 2.1* 1.9*    Assessment and Plan: 67 y.o. male with PMH HLD, arthritis, OSA and newly diagnosed DLBCL who was admitted to [facility] from [facility] on 5/23 with persistent abd pain, fatigue, nausea, neck pain, lightheadedness, night sweats, weight loss, poor oral intake. He is s/p expl lap with rt hemicolectomy and end ileostomy on 5/28 for perforation of rt colon into RP due to extensive B cell lymphoma involvement . Also with hx BLE DVT. He is s/p port a cath placement on 5/24 and IVC filter placement on 09/25/22 ; s/p right TG approach drain placement (12 fr to JP) 6/3 into post pelvic fluid collection ; WBC nl, hgb 9.1(8.7), creat 0.52, drain fl cx - e coli; MRI brain 6/11:   Gyral enhancement in a sharply demarcated, contiguous area involving the right temporal lobe, occipital lobe, and inferior parietal lobe, with associated gyral swelling and hemosiderin deposition, likely petechial hemorrhage. This appears localized to the posterior MCA territory, and is favored to represent a subacute infarct, although lymphomatous involvement could appear similar.   Pt/family refusing LP now; cont current tx for now- ? Pall care input; obtain f/u CT A/P later this week if drain outputs decrease; other plans per CCS/TRH/ONC   Electronically Signed: D. Jeananne Rama, PA-C 10/09/2022, 2:56 PM   I spent a total of 15 Minutes at the the patient's bedside AND on the patient's hospital floor or unit, greater than 50% of which was counseling/coordinating care for  pelvic abscess drain       Patient ID: Justin Cameron, male   DOB: Dec 29, 1955, 67 y.o.   MRN: [id number]

## 2022-10-09 NOTE — Progress Notes (Addendum)
[facility] PROGRESS NOTE    Progress Note  Justin Cameron  DGU:[id number] DOB: [birth year] DOA: 09/01/2022 PCP: Daisy Floro, MD     Brief Narrative:   Justin Cameron is an 67 y.o. 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 Oncology on board and she was started empirically on rituximab on 09/20/2022    Significant Events: 5/23: Direct admit from home per medical oncology to [facility] 5/23: IR consulted and Port-A-Cath placed 5/24: Started rituximab per oncology 5/28: General surgery consulted for large bowel obstruction in setting of stage IV DLBCL; exploratory laparotomy with right hemicolectomy and end ileostomy by Dr. Luisa Hart; transfer to ICU under PCCM service as remained on ventilatory support following surgery; started on TPN 5/29: IR consulted for IVC filter placement 5/30: Transfused 1 unit PRBC 5/31: Transferred back to hospitalist service 6/3: IR consulted for CT drain placement due to repeat CT findings of large collection in right abdomen and surgical bed; following drain placement patient became tachycardic, hypotensive febrile, PCCM reconsulted and patient was intubated and transferred back to ICU with septic shock with broadening of antibiotics 6/6: GOC discussed, CODE STATUS changed to DNR, extubated 6/7: Weaning stress dose steroids 6/8: Transferred back to [facility] 6/11 : Wound VAC was discontinued due to ongoing clogging  Assessment/Plan:   Septic shock secondary to right lower quadrant E. coli abscess with colonic perforation in the setting of diffuse large B-cell  lymphoma: Oncology on board she is not a candidate for restarting chemotherapy. Surgery on board acutely on clear liquid diet Continue wound care by surgery wet-to-dry. Currently on TPN. Not a candidate for chemotherapy with current open wound. 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 Dr. Candise Che oncology has been consulted. Further management per Dr. Candise Che. 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  Adrenal insufficiency: Now wean off steroids.  Acute respiratory failure with hypoxia in the setting of septic shock: Following IR drainage with wound VAC placement. Which require intubation. Successfully extubated on 10/03/2022 Currently on 2 L nasal cannula.  History of gout/at risk of tumor lysis syndrome: Medical oncology following.  Lower extremity DVT status post IVC filter: Not a candidate for anticoagulation. IVC filter was placed on 09/25/2022.  A-fib with RVR: In the setting of septic shock treated with IV metoprolol followed by amiodarone these medications are being weaned off. In sinus rhythm on telemetry.  Critical illness anemia: Status post 2 units of packed red blood cells. To keep hemoglobin greater than 8, this morning is 9.1.  Severe protein caloric malnutrition: Currently on TNA surgery allowed her to have a clear liquid diet we will see how she tolerates.  Debility/deconditioning: Continue PT OT anticipate skilled nursing facility needs.   DVT prophylaxis: lovenox Family Communication:none Status is: Inpatient Remains inpatient appropriate because: Large B-cell lymphoma    Code Status:     Code Status Orders  (From admission, onward)           Start     Ordered   10/03/22 1114  Do not attempt resuscitation (DNR)  Continuous  Question Answer Comment  If patient has no pulse and is not breathing Do  Not Attempt Resuscitation   If patient has a pulse and/or is breathing: Medical Treatment Goals LIMITED ADDITIONAL INTERVENTIONS: Use medication/IV fluids and cardiac monitoring as indicated; Do not use intubation or mechanical ventilation (DNI), also provide comfort medications.  Transfer to Progressive/Stepdown as indicated, avoid Intensive Care.   Consent: Discussion documented in EHR or advanced directives reviewed      10/03/22 1114           Code Status History     Date Active Date Inactive Code Status Order ID Comments User Context   09/30/2022 1826 10/03/2022 1114 Full Code [id number]  [name], NP Inpatient   Sep 26, 2022 1536 09/30/2022 1826 Full Code [id number]  [name] Inpatient   09/27/2022 1656 Sep 26, 2022 1536 Full Code [id number]  [name], MD Inpatient      Advance Directive Documentation    Flowsheet Row Most Recent Value  Type of Advance Directive Healthcare Power of Attorney, Living will  Pre-existing out of facility DNR order (yellow form or pink MOST form) --  "MOST" Form in Place? --         IV Access:   Peripheral IV   Procedures and diagnostic studies:   MR BRAIN W WO CONTRAST  Result Date: 10/08/2022 CLINICAL DATA:  Concern for CNS involvement from high-grade lymphoma EXAM: MRI HEAD WITHOUT AND WITH CONTRAST TECHNIQUE: Multiplanar, multiecho pulse sequences of the brain and surrounding structures were obtained without and with intravenous contrast. CONTRAST:  9mL GADAVIST GADOBUTROL 1 MMOL/ML IV SOLN COMPARISON:  No prior MRI available, correlation is made with CT head 610 2024 FINDINGS: Brain: Gyral enhancement in a sharply demarcated, contiguous area involving the right temporal lobe, occipital lobe, and inferior parietal lobe. These areas are associated with increased T2 hyperintense signal and gyral swelling, as well as hemosiderin deposition, likely petechial hemorrhage. Some areas of cortex to have increased signal on  diffusion-weighted imaging with ADC correlates (for example series 5, image 19 and series 6, image 19), which may be related to susceptibility. Mild mass effect on the right occipital and temporal horns of the lateral ventricle, but no significant midline shift. No other restricted diffusion to suggest acute or subacute infarct. No other parenchymal hemorrhage or midline shift. No hydrocephalus or extra-axial collection. Vascular: Normal arterial flow voids. Normal arterial and venous enhancement. Skull and upper cervical spine: Normal marrow signal. Sinuses/Orbits: Clear paranasal sinuses. No acute finding in the orbits. Other: The mastoid air cells are well aerated. IMPRESSION: Gyral enhancement in a sharply demarcated, contiguous area involving the right temporal lobe, occipital lobe, and inferior parietal lobe, with associated gyral swelling and hemosiderin deposition, likely petechial hemorrhage. This appears localized to the posterior MCA territory, and is favored to represent a subacute infarct, although lymphomatous involvement could appear similar. These results will be called to the ordering clinician or representative by the Radiologist Assistant, and communication documented in the PACS or [employer]. Electronically Signed   By: Wiliam Ke M.D.   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 EXAM: CT HEAD WITHOUT AND WITH CONTRAST TECHNIQUE: Contiguous axial images were obtained from the base of the skull through the vertex without and with intravenous contrast. RADIATION DOSE REDUCTION: This exam was performed according to the departmental dose-optimization program which includes automated exposure control, adjustment of  the mA and/or kV according to patient size and/or use of iterative reconstruction technique. CONTRAST:  75mL OMNIPAQUE IOHEXOL 300 MG/ML  SOLN COMPARISON:  None  Available. FINDINGS: Brain: Leptomeningeal and gyral enhancement along the posterior right temporal lobe, extending into the inferior parietal and occipital lobes with subjacent hypoattenuation of the gray and white matter, suspicious for CNS involvement of lymphoma. No significant mass effect or midline shift. No acute hemorrhage. No hydrocephalus or extra-axial collection. Vascular: No hyperdense vessel or unexpected calcification. Visible vessels are patent. Skull: No calvarial fracture or suspicious bone lesion. Skull base is unremarkable. Sinuses/Orbits: Unremarkable. Other: None. IMPRESSION: Leptomeningeal and gyral enhancement along the posterior right temporal lobe, extending into the inferior right parietal and occipital lobes with subjacent hypoattenuation of the gray and white matter, suspicious for CNS involvement of lymphoma. No significant mass effect or midline shift. Electronically Signed   By: Orvan Falconer M.D.   On: 10/07/2022 12:57     Medical Consultants:   None.   Subjective:    Thierry L Mottola confused this morning  Objective:    Vitals:   10/09/22 0100 10/09/22 0400 10/09/22 0500 10/09/22 0600  BP: (!) 112/59 (!) 152/58 (!) 141/67 (!) 111/46  Pulse: 90 (!) 109 (!) 101 (!) 101  Resp: (!) 25 (!) 26 (!) 30 16  Temp:  99.8 F (37.7 C)    TempSrc:  Oral    SpO2: 98% 98% 95% 92%  Weight:   90.5 kg   Height:       SpO2: 92 % O2 Flow Rate (L/min): 2 L/min FiO2 (%): 60 %   Intake/Output Summary (Last 24 hours) at 10/09/2022 0658 Last data filed at 10/09/2022 0000 Gross per 24 hour  Intake 1963.27 ml  Output 1420 ml  Net 543.27 ml   Filed Weights   10/07/22 0705 10/08/22 0500 10/09/22 0500  Weight: 92.3 kg 87.8 kg 90.5 kg    Exam: General exam: In no acute distress. Respiratory system: Good air movement and clear to auscultation. Cardiovascular system: S1 & S2 heard, RRR. No JVD.  Gastrointestinal system: Abdomen is nondistended, soft and nontender.   Extremities: No pedal edema. Skin: No rashes, lesions or ulcers Psychiatry: No judgment or insight of medical condition.   Data Reviewed:    Labs: Basic Metabolic Panel: Recent Labs  Lab 10/04/22 1710 10/05/22 0554 10/05/22 1837 10/06/22 0305 10/07/22 0615 10/08/22 0212 10/09/22 0547  NA  --  147*  --  147* 145 144 136  K  --  3.5  --  3.5 3.4* 3.0* 3.3*  CL  --  111  --  114* 113* 112* 105  CO2  --  27  --  25 25 24 22   GLUCOSE  --  134*  --  125* 103* 104* 92  BUN  --  26*  --  28* 28* 30* 23  CREATININE  --  0.60*  --  0.50* 0.57* 0.51* 0.52*  CALCIUM  --  8.2*  --  8.1* 8.1* 8.0* 7.9*  MG 2.2  --  2.2 2.3 2.0  --  1.9  PHOS 3.5  --  5.3* 4.5 3.2 3.4 3.0   GFR Estimated Creatinine Clearance: 99.2 mL/min (A) (by C-G formula based on SCr of 0.52 mg/dL (L)). Liver Function Tests: Recent Labs  Lab 10/02/22 0956 10/03/22 0702 10/07/22 0615  AST 21 18 25   ALT 11 13 19   ALKPHOS 90 122 116  BILITOT 1.2 0.8 1.1  PROT 4.9* 5.2* 5.0*  ALBUMIN  2.1* 2.1* 1.9*   No results for input(s): "LIPASE", "AMYLASE" in the last 168 hours. No results for input(s): "AMMONIA" in the last 168 hours. Coagulation profile No results for input(s): "INR", "PROTIME" in the last 168 hours. COVID-19 Labs  No results for input(s): "DDIMER", "FERRITIN", "LDH", "CRP" in the last 72 hours.  No results found for: "SARSCOV2NAA"  CBC: Recent Labs  Lab 10/03/22 0614 10/04/22 0436 10/05/22 0554 10/05/22 1837 10/06/22 0305 10/07/22 0615 10/08/22 0212 10/09/22 0547  WBC 23.5* 18.9* 12.1*  --  13.6* 11.9* 10.5 8.6  NEUTROABS 21.7* 16.8* 9.6*  --   --   --  8.8* 6.7  HGB 7.3* 7.1* 6.8* 8.8* 8.8* 9.2* 8.7* 9.1*  HCT 25.3* 22.2* 21.8* 28.4* 28.3* 30.4* 28.2* 29.8*  MCV 94.1 80.7 82.3  --  83.2 85.2 85.2 83.2  PLT 222 250 284  --  293 291 257 235   Cardiac Enzymes: No results for input(s): "CKTOTAL", "CKMB", "CKMBINDEX", "TROPONINI" in the last 168 hours. BNP (last 3 results) No results  for input(s): "PROBNP" in the last 8760 hours. CBG: Recent Labs  Lab 10/08/22 0525 10/08/22 1206 10/08/22 1803 10/08/22 2341 10/09/22 0552  GLUCAP 114* 103* 106* 92 88   D-Dimer: No results for input(s): "DDIMER" in the last 72 hours. Hgb A1c: No results for input(s): "HGBA1C" in the last 72 hours. Lipid Profile: Recent Labs    10/07/22 0615  TRIG 134   Thyroid function studies: No results for input(s): "TSH", "T4TOTAL", "T3FREE", "THYROIDAB" in the last 72 hours.  Invalid input(s): "FREET3" Anemia work up: No results for input(s): "VITAMINB12", "FOLATE", "FERRITIN", "TIBC", "IRON", "RETICCTPCT" in the last 72 hours. Sepsis Labs: Recent Labs  Lab 10/06/22 0305 10/07/22 0615 10/08/22 0212 10/09/22 0547  WBC 13.6* 11.9* 10.5 8.6   Microbiology Recent Results (from the past 240 hour(s))  Body fluid culture w Gram Stain     Status: None   Collection Time: 10/01/22  5:40 PM   Specimen: JP Drain; Body Fluid  Result Value Ref Range Status   Specimen Description   Final    JP DRAINAGE RIGHT PELVIC Performed at [facility], [address], [place], [place] [address]    Special Requests   Final    Normal Performed at [facility], [address] [place], [place] [address]    Gram Stain   Final    MODERATE WBC PRESENT, PREDOMINANTLY PMN FEW GRAM NEGATIVE RODS RARE GRAM POSITIVE COCCI RARE GRAM POSITIVE RODS Performed at [facility], [address] [place], [place] [address]    Culture MODERATE ESCHERICHIA COLI  Final   Report Status 10/03/2022 FINAL  Final   Organism ID, Bacteria ESCHERICHIA COLI  Final      Susceptibility   Escherichia coli - MIC*    AMPICILLIN >=32 RESISTANT Resistant     CEFEPIME <=0.12 SENSITIVE Sensitive     CEFTAZIDIME <=1 SENSITIVE Sensitive     CEFTRIAXONE 0.5 SENSITIVE Sensitive     CIPROFLOXACIN <=0.25 SENSITIVE Sensitive     GENTAMICIN <=1 SENSITIVE Sensitive     IMIPENEM <=0.25  SENSITIVE Sensitive     TRIMETH/SULFA <=20 SENSITIVE Sensitive     AMPICILLIN/SULBACTAM >=32 RESISTANT Resistant     PIP/TAZO 8 SENSITIVE Sensitive     * MODERATE ESCHERICHIA COLI     Medications:    allopurinol  100 mg Oral BID   Chlorhexidine Gluconate Cloth  6 each Topical Daily   docusate  100 mg Per  Tube BID   insulin aspart  0-9 Units Subcutaneous Q6H   polyethylene glycol  17 g Per Tube Daily   sodium chloride flush  10-40 mL Intracatheter Q12H   sodium chloride flush  5 mL Intracatheter Q8H   Continuous Infusions:  sodium chloride Stopped (10/02/22 2010)   sodium chloride 10 mL/hr at 10/09/22 0000   cefTRIAXone (ROCEPHIN)  IV Stopped (10/08/22 1703)   dextrose Stopped (10/08/22 1848)   TPN ADULT (ION) 100 mL/hr at 10/09/22 0000      LOS: 20 days   Marinda Elk  [facility]  10/09/2022, 6:58 AM

## 2022-10-09 NOTE — Consult Note (Addendum)
WOC Nurse ostomy follow up Surgery at bedside to discuss plan of care for abd wound with patient and wife. Dressing and abd binder in place. Flexible convex ostomy pouch [place] # [phone]) and barrier ring [place] # [phone]) applied yesterday are intact with good seal, mod amt liquid brown stool. Unfortunately, we do not have high output pouches which are convex.  Discussed with bedside nurse if pouch is leaking or needs to be emptied frequently, they can attempt to apply a convex urostomy pouch and attach to a bedside drainage bag if stool remains liquid. Extra supplies left at the bedside for staff nurse's use.  WOC nurse will continue to follow weekly for ostomy needs; please refer to surgical team for further questions regarding abd wound and plan of care.  Thank-you,  Cammie Mcgee MSN, RN, CWOCN, [place], CNS [phone]

## 2022-10-10 DIAGNOSIS — G893 Neoplasm related pain (acute) (chronic): Secondary | ICD-10-CM | POA: Diagnosis not present

## 2022-10-10 DIAGNOSIS — F19982 Other psychoactive substance use, unspecified with psychoactive substance-induced sleep disorder: Secondary | ICD-10-CM | POA: Diagnosis not present

## 2022-10-10 DIAGNOSIS — K5903 Drug induced constipation: Secondary | ICD-10-CM | POA: Diagnosis not present

## 2022-10-10 DIAGNOSIS — C8330 Diffuse large B-cell lymphoma, unspecified site: Secondary | ICD-10-CM | POA: Diagnosis not present

## 2022-10-10 LAB — PHOSPHORUS: Phosphorus: 4.1 mg/dL (ref 2.5–4.6)

## 2022-10-10 LAB — COMPREHENSIVE METABOLIC PANEL
ALT: 19 U/L (ref 0–44)
AST: 27 U/L (ref 15–41)
Albumin: 2 g/dL — ABNORMAL LOW (ref 3.5–5.0)
Alkaline Phosphatase: 112 U/L (ref 38–126)
Anion gap: 12 (ref 5–15)
BUN: 22 mg/dL (ref 8–23)
CO2: 21 mmol/L — ABNORMAL LOW (ref 22–32)
Calcium: 8 mg/dL — ABNORMAL LOW (ref 8.9–10.3)
Chloride: 101 mmol/L (ref 98–111)
Creatinine, Ser: 0.58 mg/dL — ABNORMAL LOW (ref 0.61–1.24)
GFR, Estimated: >60 mL/min (ref >60)
Glucose, Bld: 94 mg/dL (ref 70–99)
Potassium: 3.5 mmol/L (ref 3.5–5.1)
Sodium: 134 mmol/L — ABNORMAL LOW (ref 135–145)
Total Bilirubin: 0.9 mg/dL (ref 0.3–1.2)
Total Protein: 5 g/dL — ABNORMAL LOW (ref 6.5–8.1)

## 2022-10-10 LAB — GLUCOSE, CAPILLARY: Glucose-Capillary: 96 mg/dL (ref 70–99)

## 2022-10-10 LAB — MAGNESIUM: Magnesium: 1.8 mg/dL (ref 1.7–2.4)

## 2022-10-10 MED ORDER — ACETAMINOPHEN 10 MG/ML IV SOLN
1000.0000 mg | Freq: Once | INTRAVENOUS | Status: AC
  Administered 2022-10-10: 1000 mg via INTRAVENOUS
  Filled 2022-10-10: qty 100

## 2022-10-10 MED ORDER — METHOCARBAMOL 500 MG PO TABS
500.0000 mg | ORAL_TABLET | Freq: Once | ORAL | Status: AC
  Administered 2022-10-10: 500 mg via ORAL
  Filled 2022-10-10: qty 1

## 2022-10-10 MED ORDER — BACLOFEN 10 MG PO TABS
5.0000 mg | ORAL_TABLET | Freq: Three times a day (TID) | ORAL | Status: DC
  Administered 2022-10-10 – 2022-10-11 (×3): 5 mg via ORAL
  Filled 2022-10-10 (×3): qty 1

## 2022-10-10 MED ORDER — NALOXONE HCL 0.4 MG/ML IJ SOLN: 0.4000 mg | INTRAMUSCULAR | Status: DC | PRN

## 2022-10-10 MED ORDER — MELATONIN 3 MG PO TABS
3.0000 mg | ORAL_TABLET | Freq: Every day | ORAL | Status: DC
  Administered 2022-10-10: 3 mg via ORAL
  Filled 2022-10-10: qty 1

## 2022-10-10 MED ORDER — ONDANSETRON HCL 4 MG/2ML IJ SOLN
4.0000 mg | Freq: Four times a day (QID) | INTRAMUSCULAR | Status: DC | PRN
  Administered 2022-10-11: 4 mg via INTRAVENOUS
  Filled 2022-10-10: qty 2

## 2022-10-10 MED ORDER — METOPROLOL TARTRATE 5 MG/5ML IV SOLN: 5.0000 mg | Freq: Four times a day (QID) | INTRAVENOUS | Status: DC | PRN

## 2022-10-10 MED ORDER — MAGIC MOUTHWASH W/LIDOCAINE
5.0000 mL | Freq: Three times a day (TID) | ORAL | Status: DC | PRN
  Filled 2022-10-10: qty 5

## 2022-10-10 MED ORDER — ALUM & MAG HYDROXIDE-SIMETH 200-200-20 MG/5ML PO SUSP
30.0000 mL | ORAL | Status: DC | PRN
  Administered 2022-10-10: 30 mL via ORAL
  Filled 2022-10-10: qty 30

## 2022-10-10 MED ORDER — FENTANYL 50 MCG/ML IV PCA SOLN
INTRAVENOUS | Status: DC
  Administered 2022-10-10: 200 ug via INTRAVENOUS
  Administered 2022-10-10: 175 ug via INTRAVENOUS
  Administered 2022-10-11: 250 ug via INTRAVENOUS
  Administered 2022-10-11: 125 ug via INTRAVENOUS
  Administered 2022-10-11: 150 ug via INTRAVENOUS

## 2022-10-10 MED ORDER — JUVEN PO PACK
1.0000 | PACK | Freq: Two times a day (BID) | ORAL | Status: DC
  Administered 2022-10-11: 1 via ORAL
  Filled 2022-10-10: qty 1

## 2022-10-10 MED ORDER — SODIUM CHLORIDE 0.9% FLUSH: 9.0000 mL | INTRAVENOUS | Status: DC | PRN

## 2022-10-10 MED ORDER — BACLOFEN 10 MG PO TABS
5.0000 mg | ORAL_TABLET | Freq: Two times a day (BID) | ORAL | Status: DC
  Administered 2022-10-10: 5 mg via ORAL
  Filled 2022-10-10: qty 1

## 2022-10-10 MED ORDER — TRAVASOL 10 % IV SOLN
INTRAVENOUS | Status: DC
  Filled 2022-10-10: qty 998.4

## 2022-10-10 MED ORDER — POTASSIUM CHLORIDE 10 MEQ/50ML IV SOLN
10.0000 meq | INTRAVENOUS | Status: AC
  Administered 2022-10-10 (×4): 10 meq via INTRAVENOUS
  Filled 2022-10-10 (×4): qty 50

## 2022-10-10 MED ORDER — FENTANYL CITRATE PF 50 MCG/ML IJ SOSY
50.0000 ug | PREFILLED_SYRINGE | Freq: Once | INTRAMUSCULAR | Status: AC
  Administered 2022-10-10: 50 ug via INTRAVENOUS
  Filled 2022-10-10: qty 1

## 2022-10-10 NOTE — Progress Notes (Signed)
PHARMACY - TOTAL PARENTERAL NUTRITION CONSULT NOTE   Indication: Prolonged ileus  Patient Measurements: Height: 5\' 8"  (172.7 cm) Weight: 88.8 kg (195 lb 12.3 oz) IBW/kg (Calculated) : 68.4 TPN AdjBW (KG): 81 Body mass index is 29.77 kg/m.  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  He was supposed to start chemo, but developed severe acute abdominal pain on 5/28.  NG tube placed, imaging to confirm placement shows possible free air and he was taken to OR on 5/28. Pharmacy consulted to dose TPN for prolonged ileus.    CT with increased fluid collection in right abdomen around surgical drain. IR placed drain 6/3. Patient later intubated and started on vasopressors. Patient with extravascular volume, given albumin 6/4. TPN concentrated 6/4 in attempt to reduce fluid burden. Patient remains intubated but with reduced vasopressor requirements on 6/5. Fever curve and WBC improved. Patient extubated 6/6 and off of vasopressors. Tube feeds started at trickle 6/6, but patient with episode of vomiting 6/7 am and tube feeds subsequently held.  Glucose / Insulin: No hx DM, no PTA medications. Tapering steroids. CBGs at goal < 150 and stable. Sensitive SSI: None required/24 hrs Electrolytes: K (3.5) improved but still at low end of normal.  Na slightly low (134),  Renal: SCr low, BUN WNL Hepatic: AST/ALT, Tbili, Alk Phos WNL. TG wnl. Albumin low at 1.9. Intake / Output: 24 hr, overall net -58 mL - Drains 160 mL (increased) - Urine 1250 mL - ostomy: 1175 mL GI Imaging:  -6/2 CT abd/pelvis - large irregular fluid collection in the right abdomen lies in the location of the previous seen large necrotic mass -5/28  Lucency in the right upper quadrant was not seen on the previous images. Free intraperitoneal air is not excluded.  6/9 Abd xray shows no obstructive gas  pattern. 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  - 6/3 IR drainage catheter placed into posterior pelvic fluid collection  Central access: Port (09/20/22), Triple lumen PICC placed 5/29 TPN start date: 5/29  Nutritional Goals: RD Assessment: updated 6/7 Estimated Needs Total Energy Estimated Needs: 2250-2450 kcals Total Protein Estimated Needs: 120-140 grams Total Fluid Estimated Needs: >/= 2.2L  Current Nutrition:  TPN TFs remain on hold Prosource TF20 BID per tube (40g of protein and 160 kcal daily) - resumed 6/12 - pt had both doses charted Ensure BID - no doses charted as given yet FLD - not tolerating yet  Significant Events: -6/11: D10 infusion ordered per MD while TPN disconnected for MRI  Plan:  AM: KCl 10 mEq IV x 4 runs ordered per Md  At 1800: Continue TPN at 80 mL/hr Provides 100g of protein and 2188 kcal per day Reduced goal rate back to 80 mL/hr and total protein/kcal in TPN since Prosource resumed Electrolytes in TPN: Increase K, Na, Mag Na 40 mEq/L, K 80 mEq/L (max), Ca 3 mEq/L, Mg 7 mEq/L, and Phos 20 mmol/L.  Cl:Ac 1:1 Add standard MVI and trace elements to TPN Stop SSI/CBG checks - consistently stable at goal  Monitor TPN labs on Mon/Thurs and prn   Hessie Knows, PharmD, BCPS Secure Chat if ?s 10/10/2022 8:15 AM

## 2022-10-10 NOTE — Progress Notes (Signed)
HEMATOLOGY/ONCOLOGY INPATIENT PROGRESS NOTE  Date of Service: 10/09/2022  Inpatient Attending: .Dr Robb Matar   SUBJECTIVE Patient was seen in medical oncology follow-up with his wife and other family members at bedside.  He was evaluated by neurology who also believes that the MRI changes is likely from a CVA.  Recommended possible LP to be more certain that there is no CNS involvement by lymphoma. I discussed this in detail with the patient and at this time the patient wanted to hold off on the LP. I had a detailed discussion regarding goals of care with the patient and his wife.  In the bigger picture the patient does have significant limitations to be able to use optimal chemoimmunotherapy to treat his lymphoma at this time with high risk of wound complications fistulas and sepsis from his open abdominal wound that is still healing.  His neurological deficits could also play a role in his functional status.  And even though his lymphoma is quite sensitive to treatments his other limitations are limiting the ability to use optimal oncologic interventions. If we were to do a stepwise approach with Rituxan followed by low-dose chemotherapy he might have some lymphoma response but we are not sure that we could maintain an density of treatment high enough to reach complete remission at this time. Also if the lumbar puncture confirms CNS lymphoma that might significantly worsen his prognosis since he would not be a candidate for high-dose IV methotrexate and intrathecal treatments would only serve to temporarily control the disease. The patient and his wife note that they now have a more detailed understanding of his situation from all the doctors involved and would like to think about the situation before making additional decisions.  OBJECTIVE:  NAD  PHYSICAL EXAMINATION: . Vitals:   10/09/22 2200 10/09/22 2300 10/10/22 0000 10/10/22 0100  BP: (!) 107/55 (!) 102/54 (!) 103/54 (!) 124/94  Pulse:  (!) 112 (!) 111 (!) 109 (!) 106  Resp: 19 (!) 22 (!) 22 (!) 26  Temp:  97.8 F (36.6 C)    TempSrc:  Oral    SpO2: 90% 95% 92% 97%  Weight:      Height:       GENERAL: More awake today with fairly clear speech with some episodes of confusion which is easily cleared. OROPHARYNX: NG tube in situ NECK: supple, no JVD LYMPH:  no palpable lymphadenopathy in the cervical, axillary or inguinal regions LUNGS: clear to auscultation b/l with normal respiratory effort HEART: regular rate & rhythm, mild tachy ABDOMEN: hypoactive bowel sounds Extremity: trace b/l pedal edema PSYCH: More awake and verbal today. NEURO: moving all 4 extremities   MEDICAL HISTORY:  Past Medical History:  Diagnosis Date   Arthritis    High cholesterol     SURGICAL HISTORY: Past Surgical History:  Procedure Laterality Date   IR IMAGING GUIDED PORT INSERTION  09/20/2022   IR IVC FILTER PLMT / S&I /IMG GUID/MOD SED  09/25/2022   LAPAROTOMY N/A 2022-10-07   Procedure: EXPLORATORY LAPAROTOMY, BOWEL RESECTION, ILEOSTOMY;  Surgeon: Harriette Bouillon, MD;  Location: [facility];  Service: General;  Laterality: N/A;   REPLACEMENT TOTAL KNEE  10/2018   ROTATOR CUFF REPAIR      SOCIAL HISTORY: Social History   Socioeconomic History   Marital status: Married    Spouse name: Not on file   Number of children: Not on file   Years of education: Not on file   Highest education level: Not on file  Occupational History  Not on file  Tobacco Use   Smoking status: Never   Smokeless tobacco: Never  Vaping Use   Vaping Use: Never used  Substance and Sexual Activity   Alcohol use: Not Currently   Drug use: Not Currently   Sexual activity: Not on file  Other Topics Concern   Not on file  Social History Narrative   Not on file   Social Determinants of Health   Financial Resource Strain: Not on file  Food Insecurity: No Food Insecurity (09/23/2022)   Hunger Vital Sign    Worried About Running Out of Food in the Last  Year: Never true    Ran Out of Food in the Last Year: Never true  Transportation Needs: No Transportation Needs (09/23/2022)   PRAPARE - Administrator, Civil Service (Medical): No    Lack of Transportation (Non-Medical): No  Physical Activity: Not on file  Stress: Not on file  Social Connections: Not on file  Intimate Partner Violence: Not At Risk (09/23/2022)   Humiliation, Afraid, Rape, and Kick questionnaire    Fear of Current or Ex-Partner: No    Emotionally Abused: No    Physically Abused: No    Sexually Abused: No    FAMILY HISTORY: Family History  Problem Relation Age of Onset   Atrial fibrillation Mother    Alzheimer's disease Mother    Leukemia Father    Diabetes Father    Colon cancer Neg Hx    Rectal cancer Neg Hx    Esophageal cancer Neg Hx    Stomach cancer Neg Hx     ALLERGIES:  has no allergies on file.  MEDICATIONS:  Scheduled Meds:  allopurinol  100 mg Oral BID   Chlorhexidine Gluconate Cloth  6 each Topical Daily   docusate  100 mg Per Tube BID   feeding supplement  237 mL Oral BID BM   feeding supplement (PROSource TF20)  60 mL Per Tube BID   insulin aspart  0-9 Units Subcutaneous Q6H   polyethylene glycol  17 g Per Tube Daily   sodium chloride flush  10-40 mL Intracatheter Q12H   sodium chloride flush  5 mL Intracatheter Q8H   Continuous Infusions:  sodium chloride Stopped (10/02/22 2010)   sodium chloride 10 mL/hr at 10/10/22 0000   cefTRIAXone (ROCEPHIN)  IV Stopped (10/09/22 1450)   TPN ADULT (ION) 80 mL/hr at 10/10/22 0000   PRN Meds:.sodium chloride, sodium chloride, acetaminophen (TYLENOL) oral liquid 160 mg/5 mL, acetaminophen, alum & mag hydroxide-simeth, diphenhydrAMINE, fentaNYL (SUBLIMAZE) injection, naphazoline-glycerin, ondansetron (ZOFRAN) IV, mouth rinse, prochlorperazine, sodium chloride flush  REVIEW OF SYSTEMS:   .10 Point review of Systems was done is negative except as noted above.  LABORATORY DATA:  I have  reviewed the data as listed .Marland Kitchen    Latest Ref Rng & Units 10/08/2022    2:12 AM 10/07/2022    6:15 AM  CBC  WBC 4.0 - 10.5 K/uL 10.5  11.9   Hemoglobin 13.0 - 17.0 g/dL 8.7  9.2   Hematocrit 96.0 - 52.0 % 28.2  30.4   Platelets 150 - 400 K/uL 257  291    .    Latest Ref Rng & Units 10/08/2022    2:12 AM 10/07/2022    6:15 AM  CMP  Glucose 70 - 99 mg/dL 454  098   BUN 8 - 23 mg/dL 30  28   Creatinine 1.19 - 1.24 mg/dL 1.47  8.29   Sodium 562 - 145  mmol/L 144  145   Potassium 3.5 - 5.1 mmol/L 3.0  3.4   Chloride 98 - 111 mmol/L 112  113   CO2 22 - 32 mmol/L 24  25   Calcium 8.9 - 10.3 mg/dL 8.0  8.1   Total Protein 6.5 - 8.1 g/dL  5.0   Total Bilirubin 0.3 - 1.2 mg/dL  1.1   Alkaline Phos 38 - 126 U/L  116   AST 15 - 41 U/L  25   ALT 0 - 44 U/L  19      Component     Latest Ref Rng 09-29-22 09/20/2022 09/21/2022  HCV Ab     NON REACTIVE  NON REACTIVE     Hepatitis B Surface Ag     NON REACTIVE  NON REACTIVE     Hep B Core Total Ab     NON REACTIVE  NON REACTIVE     Procalcitonin     ng/mL 0.22     Cortisol, Plasma     ug/dL 16.1     HIV Screen 4th Generation wRfx     Non Reactive   Non Reactive    Uric Acid, Serum     3.7 - 8.6 mg/dL   5.4     I have personally reviewed the radiological images as listed and agreed with the findings in the report. MR BRAIN W WO CONTRAST  Result Date: 10/08/2022 CLINICAL DATA:  Concern for CNS involvement from high-grade lymphoma EXAM: MRI HEAD WITHOUT AND WITH CONTRAST TECHNIQUE: Multiplanar, multiecho pulse sequences of the brain and surrounding structures were obtained without and with intravenous contrast. CONTRAST:  9mL GADAVIST GADOBUTROL 1 MMOL/ML IV SOLN COMPARISON:  No prior MRI available, correlation is made with CT head 610 2024 FINDINGS: Brain: Gyral enhancement in a sharply demarcated, contiguous area involving the right temporal lobe, occipital lobe, and inferior parietal lobe. These areas are associated with increased T2  hyperintense signal and gyral swelling, as well as hemosiderin deposition, likely petechial hemorrhage. Some areas of cortex to have increased signal on diffusion-weighted imaging with ADC correlates (for example series 5, image 19 and series 6, image 19), which may be related to susceptibility. Mild mass effect on the right occipital and temporal horns of the lateral ventricle, but no significant midline shift. No other restricted diffusion to suggest acute or subacute infarct. No other parenchymal hemorrhage or midline shift. No hydrocephalus or extra-axial collection. Vascular: Normal arterial flow voids. Normal arterial and venous enhancement. Skull and upper cervical spine: Normal marrow signal. Sinuses/Orbits: Clear paranasal sinuses. No acute finding in the orbits. Other: The mastoid air cells are well aerated. IMPRESSION: Gyral enhancement in a sharply demarcated, contiguous area involving the right temporal lobe, occipital lobe, and inferior parietal lobe, with associated gyral swelling and hemosiderin deposition, likely petechial hemorrhage. This appears localized to the posterior MCA territory, and is favored to represent a subacute infarct, although lymphomatous involvement could appear similar. These results will be called to the ordering clinician or representative by the Radiologist Assistant, and communication documented in the PACS or [employer]. Electronically Signed   By: Wiliam Ke M.D.   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 EXAM: CT HEAD WITHOUT AND WITH CONTRAST TECHNIQUE: Contiguous axial images were obtained from the base of the skull through the vertex without and with intravenous contrast. RADIATION DOSE REDUCTION: This exam was performed according  to the departmental dose-optimization program which includes automated exposure control, adjustment of the mA and/or kV  according to patient size and/or use of iterative reconstruction technique. CONTRAST:  75mL OMNIPAQUE IOHEXOL 300 MG/ML  SOLN COMPARISON:  None Available. FINDINGS: Brain: Leptomeningeal and gyral enhancement along the posterior right temporal lobe, extending into the inferior parietal and occipital lobes with subjacent hypoattenuation of the gray and white matter, suspicious for CNS involvement of lymphoma. No significant mass effect or midline shift. No acute hemorrhage. No hydrocephalus or extra-axial collection. Vascular: No hyperdense vessel or unexpected calcification. Visible vessels are patent. Skull: No calvarial fracture or suspicious bone lesion. Skull base is unremarkable. Sinuses/Orbits: Unremarkable. Other: None. IMPRESSION: Leptomeningeal and gyral enhancement along the posterior right temporal lobe, extending into the inferior right parietal and occipital lobes with subjacent hypoattenuation of the gray and white matter, suspicious for CNS involvement of lymphoma. No significant mass effect or midline shift. Electronically Signed   By: Orvan Falconer M.D.   On: 10/07/2022 12:57   DG Abd 1 View  Result Date: 10/06/2022 CLINICAL DATA:  Inpatient, abdominal pain, small-bowel obstruction EXAM: ABDOMEN - 1 VIEW COMPARISON:  10/03/2022 abdominal radiograph FINDINGS: IVC filter again noted in the medial right abdomen. Transgluteal right pelvic pigtail catheter overlies the midline lower sacrum. Surgical drain overlies the right abdomen. Right lower quadrant ostomy. No disproportionately dilated small bowel loops. Decreased mild to moderate stool in the remnant colon. No evidence of pneumatosis or pneumoperitoneum on this supine view. Marked lumbar spondylosis. IMPRESSION: Nonobstructive bowel gas pattern. Decreased mild to moderate stool in the remnant colon. Electronically Signed   By: Delbert Phenix M.D.   On: 10/06/2022 16:13   DG Abd Portable 1V  Result Date: 10/03/2022 CLINICAL DATA:  NG tube  placement. EXAM: PORTABLE ABDOMEN - 1 VIEW COMPARISON:  09/16/2022. FINDINGS: Nasogastric tube passes below the diaphragm, tip in the mid stomach. Normal bowel gas pattern. IMPRESSION: Well-positioned nasal/orogastric tube. Electronically Signed   By: Amie Portland M.D.   On: 10/03/2022 12:56   DG Chest Port 1 View  Result Date: 10/02/2022 CLINICAL DATA:  Dyspnea. EXAM: PORTABLE CHEST 1 VIEW COMPARISON:  September 30, 2022. FINDINGS: Stable cardiomediastinal silhouette. Endotracheal and nasogastric tubes are unchanged in position. Right internal jugular Port-A-Cath is unchanged. Right-sided PICC line is unchanged. Minimal bibasilar subsegmental atelectasis is noted. Bony thorax is unremarkable. IMPRESSION: Stable support apparatus. Minimal bibasilar subsegmental atelectasis. Electronically Signed   By: Lupita Raider M.D.   On: 10/02/2022 11:22   DG CHEST PORT 1 VIEW  Result Date: 09/30/2022 CLINICAL DATA:  OG tube and endotracheal tube placement. EXAM: PORTABLE CHEST 1 VIEW COMPARISON:  09/19/2022 FINDINGS: An endotracheal tube with tip 2.5 cm above the carina, RIGHT PICC line with tip difficult to visualize but appears to overlie the RIGHT atrium and a RIGHT Port-A-Cath with tip overlying the RIGHT atrium as well. An enteric tube is noted with tip overlying the mid stomach. Bibasilar opacities/atelectasis noted. There is no evidence of pneumothorax or large pleural effusion. IMPRESSION: 1. RIGHT PICC line placement but otherwise unchanged appearance of the chest. Other support apparatus as described with mild bibasilar opacities/atelectasis. Electronically Signed   By: Harmon Pier M.D.   On: 09/30/2022 17:26   CT GUIDED PERITONEAL/RETROPERITONEAL FLUID DRAIN BY PERC CATH  Result Date: 09/30/2022 INDICATION: [id number] Postprocedural intraabdominal abscess [id number] EXAM: CT-guided pelvic drain placement TECHNIQUE: Multidetector CT imaging of the abdomen and pelvis was performed following the standard protocol  without IV contrast. RADIATION DOSE  REDUCTION: This exam was performed according to the departmental dose-optimization program which includes automated exposure control, adjustment of the mA and/or kV according to patient size and/or use of iterative reconstruction technique. MEDICATIONS: The patient is currently admitted to the hospital and receiving intravenous antibiotics. The antibiotics were administered within an appropriate time frame prior to the initiation of the procedure. ANESTHESIA/SEDATION: Moderate (conscious) sedation was employed during this procedure. A total of Versed 2 mg and Fentanyl 100 mcg was administered intravenously by the radiology nurse. Total intra-service moderate Sedation Time: 19 minutes. The patient's level of consciousness and vital signs were monitored continuously by radiology nursing throughout the procedure under my direct supervision. COMPLICATIONS: None immediate. PROCEDURE: Informed written consent was obtained from the patient after a thorough discussion of the procedural risks, benefits and alternatives. All questions were addressed. Maximal Sterile Barrier Technique was utilized including caps, mask, sterile gowns, sterile gloves, sterile drape, hand hygiene and skin antiseptic. A timeout was performed prior to the initiation of the procedure. The patient was placed supine on the exam table. Limited CT of the abdomen and pelvis was performed for planning purposes. This demonstrated significant improvement of gas and fluid collection in the right lower quadrant. The surgically placed drainage catheter courses through this collection. Also noted was unchanged appearance a sizable posterior pelvic collection. Given these findings, the decision was made to proceed with placement of a transgluteal pelvic drainage catheter. The patient was repositioned in the lateral decubitus position. Skin entry site was marked, and the overlying skin was prepped and draped in the standard  sterile fashion. Local analgesia was obtained with 1% lidocaine. Using intermittent CT fluoroscopy, a 19 gauge Yueh catheter was advanced towards the identified posterior pelvic fluid collection using a right transgluteal approach. Location was confirmed with CT and return of purulent material. Over an Amplatz wire, the percutaneous tract was serially dilated to accommodate a 12 French locking drainage catheter. Location was again confirmed with CT and return of additional fluid. Locking loop was formed, the drainage catheter was secured to the skin using silk suture and a dressing. It was attached to bulb suction. The patient tolerated the procedure well without immediate complication. IMPRESSION: 1. CT imaging demonstrated significant improvement in the right lower quadrant collection with surgical drainage catheter in place. Given these findings, the decision was made to proceed with drainage of the unchanged posterior pelvic collection. 2. Successful CT-guided placement of a 12 French locking drainage catheter into the posterior pelvic collection via a right transgluteal approach. Drainage catheter placed to bulb suction. Electronically Signed   By: Olive Bass M.D.   On: 09/30/2022 16:21   CT ABDOMEN PELVIS W CONTRAST  Result Date: 09/29/2022 CLINICAL DATA:  Abdominal pain.  Postop. EXAM: CT ABDOMEN AND PELVIS WITH CONTRAST TECHNIQUE: Multidetector CT imaging of the abdomen and pelvis was performed using the standard protocol following bolus administration of intravenous contrast. RADIATION DOSE REDUCTION: This exam was performed according to the departmental dose-optimization program which includes automated exposure control, adjustment of the mA and/or kV according to patient size and/or use of iterative reconstruction technique. CONTRAST:  80mL OMNIPAQUE IOHEXOL 300 MG/ML  SOLN COMPARISON:  09/18/2022. FINDINGS: Lower chest: Small right and trace left pleural effusions. Associated dependent lower lobe  atelectasis, also greater on the right. Small focus of atelectasis at the base of the left upper lobe lingula. Hepatobiliary: Liver normal in size and attenuation. Multiple small low-attenuation liver masses consistent with cysts stable from the prior CT. Normal gallbladder. No  bile duct dilation. Pancreas: Unremarkable. Spleen: Normal. Adrenals/Urinary Tract: Bilateral adrenal masses, unchanged from the recent prior CT, right 5.1 cm, 2 on the left 3.6 and 4.2 cm. Normal kidneys, ureters and bladder. Stomach/Bowel: Since the prior CT, abdominal surgery has been performed. There is a midline incision with the skin and subcutaneous fat open, fascia closed. Colon has been ligated at the level of the right mid transverse section. The ileum extends into a right anterior mid abdomen ostomy. There are postoperative collections. The largest is a heterogeneous right abdominal collection in the area of the necrotic mass. Soft tissue attenuation along some of the margins of this collection may reflect necrotic tissue or decompressed small bowel or a combination. The collection contains fluid and air. It measures approximately 23 cm from superior to inferior by 16 x 10 cm transversely. A drainage catheter lies within the mid to upper portion of this collection. Small subcapsular collection lies along the right liver margin. A subcapsular component of the larger collection lies along the anterior inferior aspect of the right liver lobe. Fluid is also seen distending the posterior pelvic recess. Remaining colon shows a generalized mild increase in the colonic stool burden, but no wall thickening or convincing inflammation. There is no small bowel dilation to suggest obstruction. Stomach is moderately distended, but without wall thickening. Tiny bubbles of free air lie just deep to the upper abdominal incision line. Vascular/Lymphatic: Well-positioned vena cava filter. Aortic atherosclerosis. Prominent gastrohepatic ligament lymph  nodes. Mesenteric nodes noted on the prior CT are less well-defined due to postoperative fluid/edema. Reproductive: Unremarkable. Other: None. Musculoskeletal: No fracture or acute finding. No osteoblastic or osteolytic lesions. IMPRESSION: 1. Status post abdominal surgery since the previous CT scan. Large irregular fluid collection in the right abdomen lies in the location of the previous seen large necrotic mass. Some of this collection may reflect the residual mass. Collection extends from the subcapsular inferior right liver to the right pelvis measuring 23 x 10 x 16 cm. Surgical drain lies within the upper to mid aspect of this collection. 2. 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 No evidence of bowel obstruction. 3. Findings of metastatic disease noted on the recent prior CT are unchanged. Electronically Signed   By: Amie Portland M.D.   On: 09/29/2022 16:02   VAS [place] LOWER EXTREMITY VENOUS (DVT)  Result Date: 09/25/2022  Lower Venous DVT Study Patient Name:  AIDENN ARTH  Date of Exam:   09/25/2022 Medical Rec #: [id number]            Accession #:    [id number] Date of Birth: 07/12/1955             Patient Gender: M Patient Age:   66 years Exam Location:  [facility] Procedure:      VAS [place] LOWER EXTREMITY VENOUS (DVT) Referring Phys: Hosie Spangle --------------------------------------------------------------------------------  Indications: Edema.  Risk Factors: Chemotherapy Cancer Lymphoma. Comparison Study: No previous exams Performing Technologist: Jody Hill RVT, RDMS  Examination Guidelines: A complete evaluation includes B-mode imaging, spectral Doppler, color Doppler, and power Doppler as needed of all accessible portions of each vessel. Bilateral testing is considered an integral part of a complete examination. Limited examinations for reoccurring indications may be performed as noted. The reflux portion of the exam is performed with  the patient in reverse Trendelenburg.  +---------+---------------+---------+-----------+----------+--------------+ RIGHT    CompressibilityPhasicitySpontaneityPropertiesThrombus Aging +---------+---------------+---------+-----------+----------+--------------+ CFV      Full  Yes      Yes                                 +---------+---------------+---------+-----------+----------+--------------+ SFJ      Full                                                        +---------+---------------+---------+-----------+----------+--------------+ FV Prox  Full           Yes      Yes                                 +---------+---------------+---------+-----------+----------+--------------+ FV Mid   Full           Yes      Yes                                 +---------+---------------+---------+-----------+----------+--------------+ FV DistalFull           Yes      Yes                                 +---------+---------------+---------+-----------+----------+--------------+ PFV      Full                                                        +---------+---------------+---------+-----------+----------+--------------+ POP      Full           Yes      Yes                                 +---------+---------------+---------+-----------+----------+--------------+ PTV      None           No       No                   Acute          +---------+---------------+---------+-----------+----------+--------------+ PERO     None           No       No                   Acute          +---------+---------------+---------+-----------+----------+--------------+   +---------+---------------+---------+-----------+----------+--------------+ LEFT     CompressibilityPhasicitySpontaneityPropertiesThrombus Aging +---------+---------------+---------+-----------+----------+--------------+ CFV      Full           Yes      Yes                                  +---------+---------------+---------+-----------+----------+--------------+ SFJ      Full                                                        +---------+---------------+---------+-----------+----------+--------------+  FV Prox  Full           Yes      Yes                                 +---------+---------------+---------+-----------+----------+--------------+ FV Mid   Full           Yes      Yes                                 +---------+---------------+---------+-----------+----------+--------------+ FV DistalFull           Yes      Yes                                 +---------+---------------+---------+-----------+----------+--------------+ PFV      Full                                                        +---------+---------------+---------+-----------+----------+--------------+ POP      Full           Yes      Yes                                 +---------+---------------+---------+-----------+----------+--------------+ PTV      Full                                                        +---------+---------------+---------+-----------+----------+--------------+ PERO     None           No       No                   Acute          +---------+---------------+---------+-----------+----------+--------------+     Summary: BILATERAL: -No evidence of popliteal cyst, bilaterally. RIGHT: - Findings consistent with acute deep vein thrombosis involving the right peroneal veins, and right posterior tibial veins.  LEFT: - Findings consistent with acute deep vein thrombosis involving the left peroneal veins.  *See table(s) above for measurements and observations. Electronically signed by Coral Else MD on 09/25/2022 at 5:07:37 PM.    Final    IR IVC FILTER PLMT / S&I Lenise Arena GUID/MOD SED  Result Date: 09/25/2022 CLINICAL DATA:  67 year old male with history of diffuse large B-cell lymphoma and lower extremity deep vein thrombosis with contraindication to  anticoagulation due to high risk of lymphoma hemorrhagic transformation. EXAM: 1. ULTRASOUND GUIDANCE FOR VASCULAR ACCESS OF THE RIGHT internal jugular VEIN. 2. IVC VENOGRAM. 3. PERCUTANEOUS IVC FILTER PLACEMENT. ANESTHESIA/SEDATION: Moderate (conscious) sedation was employed during this procedure. A total of Versed 1 mg and Fentanyl 50 mcg was administered intravenously. Moderate Sedation Time: 12 minutes. The patient's level of consciousness and vital signs were monitored continuously by radiology nursing throughout the procedure under my direct supervision. CONTRAST:  30mL OMNIPAQUE IOHEXOL 300 MG/ML SOLN, 7mL OMNIPAQUE IOHEXOL 300 MG/ML SOLN FLUOROSCOPY TIME:  One  hundred five mGy. PROCEDURE: The procedure, risks, benefits, and alternatives were explained to the patient. Questions regarding the procedure were encouraged and answered. The patient understands and consents to the procedure. The patient was prepped with Betadine in a sterile fashion, and a sterile drape was applied covering the operative field. A sterile gown and sterile gloves were used for the procedure. Local anesthesia was provided with 1% Lidocaine. Under direct ultrasound guidance, a 21 gauge needle was advanced into the right internal jugular vein with ultrasound image documentation performed. After securing access with a micropuncture dilator, a guidewire was advanced into the inferior vena cava. A deployment sheath was advanced over the guidewire. This was utilized to perform IVC venography. The deployment sheath was further positioned in an appropriate location for filter deployment. A Denali IVC filter was then advanced in the sheath. This was then fully deployed in the infrarenal IVC. Final filter position was confirmed with a fluoroscopic spot image. Contrast injection was also performed through the sheath under fluoroscopy to confirm patency of the IVC at the level of the filter. After the procedure the sheath was removed and  hemostasis obtained with manual compression. COMPLICATIONS: None. FINDINGS: IVC venography demonstrates a normal caliber IVC with no evidence of thrombus. Renal veins are identified bilaterally. The IVC filter was successfully positioned below the level of the renal veins and is appropriately oriented. This IVC filter has both permanent and retrievable indications. IMPRESSION: Placement of percutaneous IVC filter in infrarenal IVC. IVC venogram shows no evidence of IVC thrombus and normal caliber of the inferior vena cava. This filter does have both permanent and retrievable indications. PLAN: This IVC filter is potentially retrievable. The patient will be assessed for filter retrieval by Interventional Radiology in approximately 8-12 weeks. Further recommendations regarding filter retrieval, continued surveillance or declaration of device permanence, will be made at that time. Marliss Coots, MD [facility] Radiology Electronically Signed   By: Marliss Coots M.D.   On: 09/25/2022 15:20   ECHOCARDIOGRAM LIMITED  Result Date: 09/25/2022    ECHOCARDIOGRAM LIMITED REPORT   Patient Name:   JOHNATON ORMOND Date of Exam: 09/25/2022 Medical Rec #:  [id number]           Height:       68.0 in Accession #:    [id number]          Weight:       178.8 lb Date of Birth:  1956/03/04            BSA:          1.949 m Patient Age:    66 years            BP:           94/56 mmHg Patient Gender: M                   HR:           100 bpm. Exam Location:  Inpatient Procedure: Limited Echo, Cardiac Doppler, Color Doppler and Intracardiac            Opacification Agent Indications:    Pulmonary embolis  History:        Patient has prior history of Echocardiogram examinations, most                 recent 09/20/2022.  Sonographer:    Lucy Antigua Referring Phys: [id number] Adam Phenix IMPRESSIONS  1. Left ventricular ejection fraction, by estimation,  is 45 to 50%. The left ventricle has  mildly decreased function. There is the interventricular septum is flattened in systole, consistent with right ventricular pressure overload.  2. Right ventricular systolic function is mildly reduced. The right ventricular size is mildly enlarged.  3. Mild mitral valve regurgitation.  4. The aortic valve is tricuspid. Aortic valve sclerosis is present, with no evidence of aortic valve stenosis. Comparison(s): Prior images reviewed side by side. Compared to recent prior study, LVEF is mildly decreased, with evidence of new RV strain. This study uses echo contrast. FINDINGS  Left Ventricle: Left ventricular ejection fraction, by estimation, is 45 to 50%. The left ventricle has mildly decreased function. Definity contrast agent was given IV to delineate the left ventricular endocardial borders. The interventricular septum is  flattened in systole, consistent with right ventricular pressure overload. Right Ventricle: The right ventricular size is mildly enlarged. Right ventricular systolic function is mildly reduced. Mitral Valve: Mild mitral valve regurgitation. Tricuspid Valve: Tricuspid valve regurgitation is mild. Aortic Valve: The aortic valve is tricuspid. Aortic valve sclerosis is present, with no evidence of aortic valve stenosis. Pulmonic Valve: The pulmonic valve was normal in structure. Pulmonic valve regurgitation is trivial. No evidence of pulmonic stenosis. Additional Comments: Spectral Doppler performed. Color Doppler performed.   LV Volumes (MOD) LV vol d, MOD A2C: 77.5 ml LV vol d, MOD A4C: 70.7 ml LV vol s, MOD A2C: 44.8 ml LV vol s, MOD A4C: 37.7 ml LV SV MOD A2C:     32.7 ml LV SV MOD A4C:     70.7 ml LV SV MOD BP:      35.1 ml Riley Lam MD Electronically signed by Riley Lam MD Signature Date/Time: 09/25/2022/1:37:02 PM    Final    [place] EKG SITE RITE  Result Date: 09/25/2022 If Site Rite image not attached, placement could not be confirmed due to current cardiac rhythm.  DG  Chest 1 View  Result Date: 2022-10-02 CLINICAL DATA:  Check endotracheal tube placement EXAM: PORTABLE CHEST 1 VIEW COMPARISON:  Film from earlier in the same day. FINDINGS: Gastric catheter is noted within the stomach. Endotracheal tube is noted in satisfactory position. Right chest wall port is seen and stable. Lungs are well aerated bilaterally. Minimal right effusion is seen. No acute bony abnormality is noted. IMPRESSION: Minimal right-sided effusion. Electronically Signed   By: Alcide Clever M.D.   On: 2022-10-02 20:25   DG CHEST PORT 1 VIEW  Result Date: 10-02-2022 CLINICAL DATA:  Evaluate gastric catheter placement EXAM: PORTABLE CHEST 1 VIEW COMPARISON:  09/18/2022 FINDINGS: Cardiac shadow is stable but accentuated by the portable technique. The overall inspiratory effort is poor with right basilar atelectasis. Right chest wall port is seen in satisfactory position. No pneumothorax is noted. Gastric catheter is noted with the tip superimposed over the left mainstem bronchus. This may still lie within the mid esophagus. This should be withdrawn and readvanced. IMPRESSION: Gastric catheter as described. This should be withdrawn and readvanced into the stomach. Electronically Signed   By: Alcide Clever M.D.   On: 10/02/22 15:09   DG Abd Portable 1V  Result Date: 02-Oct-2022 CLINICAL DATA:  Check gastric catheter placement EXAM: PORTABLE ABDOMEN - 1 VIEW COMPARISON:  Film from earlier in the same day. FINDINGS: Scattered large and small bowel gas is noted. Retained fecal material within the colon is seen. No free air is noted. Gastric catheter is not visualized. Chest x-ray may be helpful for further evaluation. IMPRESSION: Gastric catheter is  not visualized on this exam. Electronically Signed   By: Alcide Clever M.D.   On: 09/12/2022 15:07   DG Abd 1 View  Result Date: 09/05/2022 CLINICAL DATA:  Nasogastric placement EXAM: ABDOMEN - 1 VIEW COMPARISON:  Earlier same day FINDINGS: Nasogastric tube  enters the stomach and coils in the fundus. Less intestinal gas than was noted on the prior image. Lucency in the right upper quadrant was not seen on the previous images. Free intraperitoneal air is not excluded. If there is clinical concern regarding this possibility, either left-side-down decubitus imaging or CT abdomen would be suggested. IMPRESSION: 1. Nasogastric tube enters the stomach and coils in the fundus. 2. Less intestinal gas than was noted on the prior image. 3. Lucency in the right upper quadrant was not seen on the previous images. Free intraperitoneal air is not excluded. See above discussion. Electronically Signed   By: Paulina Fusi M.D.   On: 09/03/2022 12:14   DG Abd Portable 1V  Result Date: 09/22/2022 CLINICAL DATA:  Abdominal pain. EXAM: PORTABLE ABDOMEN - 1 VIEW COMPARISON:  None Available. FINDINGS: Mildly dilated air-filled small bowel loops are noted concerning for ileus or possibly distal small bowel obstruction. No colonic dilatation is noted. No abnormal calcifications are noted. IMPRESSION: Mildly dilated air-filled small bowel loops are noted concerning for ileus or possibly distal small bowel obstruction. Electronically Signed   By: Lupita Raider M.D.   On: 09/15/2022 09:05   IR IMAGING GUIDED PORT INSERTION  Result Date: 09/20/2022 INDICATION: Colonic mass.  New diagnosis of lymphoma. EXAM: IMPLANTED PORT A CATH PLACEMENT WITH ULTRASOUND AND FLUOROSCOPIC GUIDANCE MEDICATIONS: Ancef 2 gm IV; The antibiotic was administered within an appropriate time interval prior to skin puncture. ANESTHESIA/SEDATION: Moderate (conscious) sedation was employed during this procedure. A total of Versed 2 mg and Fentanyl 100 mcg was administered intravenously. Moderate Sedation Time: 20 minutes. The patient's level of consciousness and vital signs were monitored continuously by radiology nursing throughout the procedure under my direct supervision. FLUOROSCOPY TIME:  Fluoroscopic dose; 0 mGy  COMPLICATIONS: None immediate. PROCEDURE: The procedure, risks, benefits, and alternatives were explained to the patient. Questions regarding the procedure were encouraged and answered. The patient understands and consents to the procedure. The RIGHT neck and chest were prepped with chlorhexidine in a sterile fashion, and a sterile drape was applied covering the operative field. Maximum barrier sterile technique with sterile gowns and gloves were used for the procedure. A timeout was performed prior to the initiation of the procedure. Local anesthesia was provided with 1% lidocaine with epinephrine. After creating a small venotomy incision, a micropuncture kit was utilized to access the internal jugular vein under direct, real-time ultrasound guidance. Ultrasound image documentation was performed. The microwire was kinked to measure appropriate catheter length. A subcutaneous port pocket was then created along the upper chest wall utilizing a combination of sharp and blunt dissection. The pocket was irrigated with sterile saline. A single lumen Non-ISP power injectable port was chosen for placement. The 8 Fr catheter was tunneled from the port pocket site to the venotomy incision. The port was placed in the pocket. The external catheter was trimmed to appropriate length. At the venotomy, an 8 Fr peel-away sheath was placed over a guidewire under fluoroscopic guidance. The catheter was then placed through the sheath and the sheath was removed. Final catheter positioning was confirmed and documented with a fluoroscopic spot radiograph. The port was accessed with a Huber needle, aspirated and flushed with heparinized saline.  The port pocket incision was closed with interrupted 3-0 Vicryl suture then Dermabond was applied, including at the venotomy incision. Dressings were placed. The patient tolerated the procedure well without immediate post procedural complication. IMPRESSION: Successful placement of a RIGHT internal  jugular approach power injectable Port-A-Cath. The tip of the catheter is positioned within the proximal RIGHT atrium. The catheter is ready for immediate use. Roanna Banning, MD [facility] Radiology Electronically Signed   By: Roanna Banning M.D.   On: 09/20/2022 14:44   ECHOCARDIOGRAM COMPLETE  Result Date: 09/20/2022    ECHOCARDIOGRAM REPORT   Patient Name:   SYLYS SOBIERAJ Date of Exam: 09/20/2022 Medical Rec #:  [id number]           Height:       68.0 in Accession #:    [id number]          Weight:       177.9 lb Date of Birth:  [birth year]            BSA:          1.945 m Patient Age:    66 years            BP:           100/63 mmHg Patient Gender: M                   HR:           80 bpm. Exam Location:  Inpatient Procedure: 2D Echo, Cardiac Doppler and Color Doppler Indications:    Chemo  History:        Patient has no prior history of Echocardiogram examinations.  Sonographer:    Lucy Antigua Referring Phys: [id number] [name]  Sonographer Comments: Strain was attempted. Echo machine @ [facility] doesn't have strain Neurosurgeon. IMPRESSIONS  1. Left ventricular ejection fraction, by estimation, is 55%. The left ventricle has low normal function. The left ventricle has no regional wall motion abnormalities.  2. Right ventricular systolic function is normal. The right ventricular size is normal. There is normal pulmonary artery systolic pressure.  3. The mitral valve is normal in structure. Trivial mitral valve regurgitation. No evidence of mitral stenosis.  4. The aortic valve is normal in structure. Aortic valve regurgitation is not visualized. No aortic stenosis is present.  5. The inferior vena cava is normal in size with greater than 50% respiratory variability, suggesting right atrial pressure of 3 mmHg. FINDINGS  Left Ventricle: Left ventricular ejection fraction, by estimation, is 50 to 55%. The left ventricle has low normal function. The left  ventricle has no regional wall motion abnormalities. The left ventricular internal cavity size was normal in size. There is borderline left ventricular hypertrophy. Left ventricular diastolic parameters were normal. Right Ventricle: The right ventricular size is normal. No increase in right ventricular wall thickness. Right ventricular systolic function is normal. There is normal pulmonary artery systolic pressure. The tricuspid regurgitant velocity is 1.94 m/s, and  with an assumed right atrial pressure of 3 mmHg, the estimated right ventricular systolic pressure is 18.1 mmHg. Left Atrium: Left atrial size was normal in size. Right Atrium: Right atrial size was normal in size. Pericardium: There is no evidence of pericardial effusion. Mitral Valve: The mitral valve is normal in structure. Trivial mitral valve regurgitation. No evidence of mitral valve stenosis. Tricuspid Valve: The tricuspid valve is normal in structure. Tricuspid valve regurgitation is trivial. No evidence of tricuspid stenosis. Aortic  Valve: The aortic valve is normal in structure. Aortic valve regurgitation is not visualized. No aortic stenosis is present. Aortic valve mean gradient measures 2.0 mmHg. Aortic valve peak gradient measures 3.6 mmHg. Aortic valve area, by VTI measures 3.80 cm. Pulmonic Valve: The pulmonic valve was normal in structure. Pulmonic valve regurgitation is not visualized. No evidence of pulmonic stenosis. Aorta: The aortic root is normal in size and structure. Venous: The inferior vena cava is normal in size with greater than 50% respiratory variability, suggesting right atrial pressure of 3 mmHg. IAS/Shunts: No atrial level shunt detected by color flow Doppler.  LEFT VENTRICLE PLAX 2D LVIDd:         3.60 cm   Diastology LVIDs:         2.70 cm   LV e' medial:    10.00 cm/s LV PW:         1.20 cm   LV E/e' medial:  4.6 LV IVS:        1.00 cm   LV e' lateral:   9.36 cm/s LVOT diam:     2.20 cm   LV E/e' lateral: 4.9 LV SV:          74 LV SV Index:   38 LVOT Area:     3.80 cm  RIGHT VENTRICLE RV S prime:     10.90 cm/s TAPSE (M-mode): 1.5 cm LEFT ATRIUM             Index        RIGHT ATRIUM           Index LA Vol (A2C):   61.4 ml 31.58 ml/m  RA Area:     13.90 cm LA Vol (A4C):   58.4 ml 30.03 ml/m  RA Volume:   32.80 ml  16.87 ml/m LA Biplane Vol: 59.6 ml 30.65 ml/m  AORTIC VALVE AV Area (Vmax):    3.46 cm AV Area (Vmean):   3.17 cm AV Area (VTI):     3.80 cm AV Vmax:           94.70 cm/s AV Vmean:          64.800 cm/s AV VTI:            0.195 m AV Peak Grad:      3.6 mmHg AV Mean Grad:      2.0 mmHg LVOT Vmax:         86.20 cm/s LVOT Vmean:        54.000 cm/s LVOT VTI:          0.195 m LVOT/AV VTI ratio: 1.00  AORTA Ao Root diam: 2.90 cm Ao Asc diam:  3.60 cm MITRAL VALVE               TRICUSPID VALVE MV Area (PHT): 3.08 cm    TR Peak grad:   15.1 mmHg MV Decel Time: 246 msec    TR Vmax:        194.00 cm/s MR Peak grad: 32.5 mmHg MR Vmax:      285.00 cm/s  SHUNTS MV E velocity: 46.30 cm/s  Systemic VTI:  0.20 m MV A velocity: 61.30 cm/s  Systemic Diam: 2.20 cm MV E/A ratio:  0.76 Aditya Sabharwal Electronically signed by Dorthula Nettles Signature Date/Time: 09/20/2022/1:15:18 PM    Final    CT ABDOMEN PELVIS W CONTRAST  Result Date: 09/18/2022 CLINICAL DATA:  Colon cancer, anorexia, weakness, fever EXAM: CT ABDOMEN AND PELVIS WITH CONTRAST TECHNIQUE: Multidetector CT imaging of  the abdomen and pelvis was performed using the standard protocol following bolus administration of intravenous contrast. RADIATION DOSE REDUCTION: This exam was performed according to the departmental dose-optimization program which includes automated exposure control, adjustment of the mA and/or kV according to patient size and/or use of iterative reconstruction technique. CONTRAST:  OMNIPAQUE IOHEXOL 300 MG/ML  SOLN COMPARISON:  08/12/2022 FINDINGS: Lower chest: There are 2 left lower lobe pulmonary nodules consistent with progressive  pulmonary metastases. 1.4 cm nodule image 12/2 and a 0.8 cm nodule image 15/2. No airspace disease or effusion. Hepatobiliary: Gallbladder is moderately distended without cholelithiasis or cholecystitis. Stable cysts are seen within the liver. Since the prior exam, there is loss of normal fat plane between the inferior right lobe liver and and enlarging colonic mass at the hepatic flexure. Direct invasion of the liver cannot be excluded. Pancreas: Unremarkable. No pancreatic ductal dilatation or surrounding inflammatory changes. Spleen: Normal in size without focal abnormality. Adrenals/Urinary Tract: Bilateral adrenal masses seen previously have enlarged, consistent with metastatic disease. Right adrenal mass measures up to 5.1 cm, previously 3.5 cm. The left adrenal mass measures up to 4.7 cm, previously 3.5 cm. The kidneys enhance normally and symmetrically. No urinary tract calculi or obstructive uropathy. Bladder is unremarkable. Stomach/Bowel: A large necrotic mass at the hepatic flexure of the colon has increased in size, measuring up to 12.5 x 10.1 cm. There is increasing pericolonic fat stranding and mesenteric nodularity consistent with locally invasive disease. As discussed above, there is loss of normal fat plane between the hepatic mass and the inferior margin right lobe liver and direct invasion of the liver cannot be excluded. There is no evidence of bowel obstruction or ileus. Normal appendix right lower quadrant. Vascular/Lymphatic: Increase in the size and number of mesenteric lymph nodes surrounding the hepatic flexure mass, consistent with local nodal metastases. Largest lymph node measures 0.8 cm reference image 56/2. There is also a soft tissue mass contiguous with the left crus of the diaphragm, which appears separate from the enlarging left adrenal mass. This measures 3.5 x 3.0 cm, new since prior study and consistent with metastatic deposit. Stable atherosclerosis of the aorta and its  branches. Reproductive: Prostate is unremarkable. Other: Trace pelvic free fluid. No free intraperitoneal gas. No abdominal wall hernia. Musculoskeletal: There are no acute or destructive bony abnormalities. Reconstructed images demonstrate no additional findings. IMPRESSION: 1. Enlarging mass at the hepatic flexure of the colon consistent with progressive colonic malignancy. There is increasing pericolonic fat stranding with loss of fat plane between the mass and the right lobe liver concerning for direct hepatic involvement. 2. Increasing mesenteric adenopathy, new soft tissue mass along the crus of the left hemidiaphragm, and enlarging bilateral adrenal masses consistent with progressive intra-abdominal metastases. 3. New and enlarging left lower lobe pulmonary nodules consistent with progressive pulmonary metastatic disease. 4. Trace pelvic free fluid. 5.  Aortic Atherosclerosis (ICD10-I70.0). Electronically Signed   By: Sharlet Salina M.D.   On: 09/18/2022 18:46   DG Chest Port 1 View  Result Date: 09/18/2022 CLINICAL DATA:  Fever and vomiting EXAM: PORTABLE CHEST 1 VIEW COMPARISON:  Radiograph 09/04/2016 and CT chest 08/22/2022 FINDINGS: Stable cardiomediastinal silhouette. Aortic atherosclerotic calcification. No focal consolidation, pleural effusion, or pneumothorax. No displaced rib fractures. IMPRESSION: No active disease. Electronically Signed   By: Minerva Fester M.D.   On: 09/18/2022 17:37    ASSESSMENT & PLAN:   67 y.o. male with:  Large bowel perforation from large right hepatic flexure colonic tumor from  diffuse large B-cell lymphoma patient is status post right hemicolectomy and IVC filter placement currently on TPN status post extubation. Newly diagnosed stage IVb large B-cell lymphoma ECHo done today showed normal EF of 55% -port a cath placed by IR -Hepatitis BC and HIV testing negative Large hepatic flexure mass from large B-cell lymphoma with impending obstruction and high risk  for perforation.  Possible infection of the necrotic tissue in the tumor or bowel wall currently on antibiotics. 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  Patient is a lightheadedness and some orthostatic symptoms and severe fatigue.  Rule out adrenal insufficiency. Failure to thrive due to poor p.o. intake bowel issues and new diagnosis of large B-cell lymphoma. Dehydration due to poor p.o. intake Significant abdominal discomfort from his large colonic mass with impending obstruction. Arthritis Dyslipidemia History of gout 11.  History of obstructive sleep apnea not using CPAP at home. 12. Concern for CNS lymphoma vs CVA 13.  Acute right peroneal vein and right posterior tibial vein and left peroneal vein acute DVT on prophylactic anticoagulation and IVC filter in place. PLAN: -Met with patient and his wife at bedside.   -Had neurology input, hospital medicine input and surgical input. -Discussed possible lumbar puncture again with the patient and his wife and the patient wants to think about this. -Spent significant time again discussing goals of care with the patient and his wife and outlined the complexity of being able to do optimal chemoimmunotherapy to treat his lymphoma in the context of his open abdominal wound with high risk of nonhealing wound and wound infections as well as sepsis and also in the context of a possible new stroke. -We discussed that the lumbar puncture would be done to rule out more definitively the presence of CNS lymphoma but that this information would only be useful if the overall goal is to try to keep treating him. -If specific oncologic interventions are selected the patient and his wife do understand it might be a fairly complicated course with high risk of complications. -They are continuing to discuss with other specialists and want to think  about all the information provided today to try to make more educated decision. -Patient and his family had several detailed questions which were answered in details. -Oncology will continue to follow  The total time spent in the appointment was 35 minutes*.  All of the patient's questions were answered with apparent satisfaction. The patient knows to call the clinic with any problems, questions or concerns.   Wyvonnia Lora MD MS AAHIVMS [facility] Hematology/Oncology Physician [facility]  .*Total Encounter Time as defined by the Centers for Medicare and Medicaid Services includes, in addition to the face-to-face time of a patient visit (documented in the note above) non-face-to-face time: obtaining and reviewing outside history, ordering and reviewing medications, tests or procedures, care coordination (communications with other health care professionals or caregivers) and documentation in the medical record.

## 2022-10-10 NOTE — Progress Notes (Signed)
    Patient Name: ASIR BINGLEY           DOB: [birth year]  MRN: [id number]      Admission Date: 09/19/2022  Attending Provider: Marinda Elk, MD  Primary Diagnosis: Large cell (diffuse) non-Hodgkin's lymphoma [facility])   Level of care: Stepdown    CROSS COVER NOTE   Date of Service   10/10/2022   TAIJUAN SERVISS, 67 y.o. male, was admitted on 09/19/2022 for Large cell (diffuse) non-Hodgkin's lymphoma (HCC).    HPI/Events of Note   Poor pain management Patient  c/o abdominal pain 8/10, despite recently receiving 50 mcg fentanyl. Hemodynamically stable, soft BPs 100-120's.   Orders placed for additional 50 mcg fentanyl, IV acetaminophen, Robaxin.   Interventions/ Plan   Above        Anthoney Harada, DNP, [employer] [facility]

## 2022-10-10 NOTE — Consult Note (Signed)
Consultation Note Date: 10/10/2022   Patient Name: Justin Cameron  DOB: 10-09-1955  MRN: [id number]  Age / Sex: 67 y.o., male  PCP: Justin Floro, MD Referring Physician: Marinda Elk, MD  Reason for Consultation: Establishing goals of care  HPI/Patient Profile: 67 y.o. male     admitted on 09/17/2022   Clinical Assessment and Goals of Care: 67 year old gentleman who lives at home with his wife and daughter, B-cell lymphoma, postop day 16 on 10-10-2022 status post exploratory laparotomy with right colectomy and end ileostomy. Patient remains admitted to hospital medicine service, general surgery and medical oncology following. Palliative consult for ongoing goals of care discussions has been requested. Chart reviewed, patient seen and discussions held.  Wife also present at bedside. Palliative medicine is specialized medical care for people living with serious illness. It focuses on providing relief from the symptoms and stress of a serious illness. The goal is to improve quality of life for both the patient and the family. Goals of care: Broad aims of medical therapy in relation to the patient's values and preferences. Our aim is to provide medical care aimed at enabling patients to achieve the goals that matter most to them, given the circumstances of their particular medical situation and their constraints.    NEXT OF KIN Wife Justin Cameron  SUMMARY OF RECOMMENDATIONS   Goals of care discussions were held with the patient and his wife Justin Cameron at bedside.  Discussed about serious illness of diffuse large B-cell lymphoma, patient being in the hospital for right lower quadrant E. coli abscess with colonic perforation hospital course also complicated by episode of acute vent dependent respiratory failure-extubated on 10-03-2022. Palliative consult for ongoing goals of care discussions has been requested.   Discussed with patient and wife.  Goals wishes and values attempted to be explored.  Patient states that he is not somebody who gives up easily, he wants to get well enough to be able to travel and is hopeful for some degree of meaningful stabilization/recovery.  However, if there is ongoing decline, then he would not be opposed to more of a symptom management/comfort-focused care.  The next step in this plan is a to see whether or not the patient would want to undergo a lumbar puncture and evaluate for CNS lymphoma.  Palliative to continue to follow along.  Continue to optimize pain and non pain symptom management. Thank you for the consult.  Code Status/Advance Care Planning: DNR   Symptom Management:     Palliative Prophylaxis:  Delirium Protocol  Additional Recommendations (Limitations, Scope, Preferences): Full Scope Treatment  Psycho-social/Spiritual:  Desire for further Chaplaincy support:yes Additional Recommendations: Caregiving  Support/Resources  Prognosis:  Unable to determine  Discharge Planning: To Be Determined      Primary Diagnoses: Present on Admission:  Large cell (diffuse) non-Hodgkin's lymphoma (HCC)  Cancer related pain  Protein-calorie malnutrition, severe  Delirium due to another medical condition, acute, mixed level of activity   I have reviewed the medical record, interviewed the patient and family, and  examined the patient. The following aspects are pertinent.  Past Medical History:  Diagnosis Date   Arthritis    High cholesterol    Social History   Socioeconomic History   Marital status: Married    Spouse name: Not on file   Number of children: Not on file   Years of education: Not on file   Highest education level: Not on file  Occupational History   Not on file  Tobacco Use   Smoking status: Never   Smokeless tobacco: Never  Vaping Use   Vaping Use: Never used  Substance and Sexual Activity   Alcohol use: Not Currently   Drug  use: Not Currently   Sexual activity: Not on file  Other Topics Concern   Not on file  Social History Narrative   Not on file   Social Determinants of Health   Financial Resource Strain: Not on file  Food Insecurity: No Food Insecurity (09/23/2022)   Hunger Vital Sign    Worried About Running Out of Food in the Last Year: Never true    Ran Out of Food in the Last Year: Never true  Transportation Needs: No Transportation Needs (09/23/2022)   PRAPARE - Administrator, Civil Service (Medical): No    Lack of Transportation (Non-Medical): No  Physical Activity: Not on file  Stress: Not on file  Social Connections: Not on file   Family History  Problem Relation Age of Onset   Atrial fibrillation Mother    Alzheimer's disease Mother    Leukemia Father    Diabetes Father    Colon cancer Neg Hx    Rectal cancer Neg Hx    Esophageal cancer Neg Hx    Stomach cancer Neg Hx    Scheduled Meds:  allopurinol  100 mg Oral BID   baclofen  5 mg Oral BID   Chlorhexidine Gluconate Cloth  6 each Topical Daily   docusate  100 mg Per Tube BID   feeding supplement  237 mL Oral BID BM   feeding supplement (PROSource TF20)  60 mL Per Tube BID   melatonin  3 mg Oral QHS   polyethylene glycol  17 g Per Tube Daily   sodium chloride flush  10-40 mL Intracatheter Q12H   sodium chloride flush  5 mL Intracatheter Q8H   Continuous Infusions:  sodium chloride Stopped (10/02/22 2010)   sodium chloride Stopped (10/10/22 0925)   cefTRIAXone (ROCEPHIN)  IV Stopped (10/09/22 1450)   TPN ADULT (ION) 80 mL/hr at 10/10/22 0929   TPN ADULT (ION)     PRN Meds:.sodium chloride, sodium chloride, acetaminophen (TYLENOL) oral liquid 160 mg/5 mL, acetaminophen, alum & mag hydroxide-simeth, diphenhydrAMINE, fentaNYL (SUBLIMAZE) injection, naphazoline-glycerin, ondansetron (ZOFRAN) IV, mouth rinse, prochlorperazine, sodium chloride flush Medications Prior to Admission:  Prior to Admission medications    Medication Sig Start Date End Date Taking? Authorizing Provider  ALPRAZolam (XANAX) 0.25 MG tablet Take 0.125-0.25 mg by mouth 3 (three) times daily as needed for anxiety.   Yes [provider]  colchicine 0.6 MG tablet Take 0.6 mg by mouth daily as needed (gout). 07/19/22  Yes [provider]  diphenhydramine-acetaminophen (TYLENOL PM) 25-500 MG TABS tablet Take 1 tablet by mouth at bedtime.   Yes [provider]  diphenhydrAMINE-zinc acetate (BENADRYL EXTRA STRENGTH) cream Apply 1 Application topically 3 (three) times daily as needed for itching. 09/18/22  Yes Charlynne Pander, MD  ondansetron (ZOFRAN) 4 MG tablet TAKE 1 TABLET(4 MG) BY MOUTH  EVERY 8 HOURS AS NEEDED FOR NAUSEA OR VOMITING 08/19/22  Yes Jenel Lucks, MD  simvastatin (ZOCOR) 20 MG tablet Take 20 mg by mouth every evening.   Yes [provider]  traZODone (DESYREL) 50 MG tablet Take 50-100 mg by mouth at bedtime as needed for sleep. 07/26/22  Yes [provider]  amoxicillin-clavulanate (AUGMENTIN) 875-125 MG tablet Take 1 tablet by mouth every 12 (twelve) hours. 09/18/22   Charlynne Pander, MD  dicyclomine (BENTYL) 20 MG tablet TAKE 1 TABLET(20 MG) BY MOUTH EVERY 6 HOURS Patient not taking: Reported on 09/27/2022 08/19/22   Jenel Lucks, MD   Not on File Review of Systems Complains of hiccups, complains of mouth soreness Physical Exam Monitor noted Awake alert oriented Abdomen has ostomy and drains and surgery following Regular work of breathing  Vital Signs: BP (!) 105/51   Pulse 93   Temp 98.8 F (37.1 C) (Axillary)   Resp 17   Ht 5\' 8"  (1.727 m)   Wt 88.8 kg   SpO2 98%   BMI 29.77 kg/m  Pain Scale: CPOT POSS *See Group Information*: 1-Acceptable,Awake and alert Pain Score: Asleep   SpO2: SpO2: 98 % O2 Device:SpO2: 98 % O2 Flow Rate: .O2 Flow Rate (L/min): 2 L/min  IO: Intake/output summary:  Intake/Output Summary (Last 24 hours) at 10/10/2022  1202 Last data filed at 10/10/2022 1610 Gross per 24 hour  Intake 2111.1 ml  Output 2505 ml  Net -393.9 ml    LBM: Last BM Date : 10/09/22 Baseline Weight: Weight: 81 kg Most recent weight: Weight: 88.8 kg     Palliative Assessment/Data:   PPS 60%  Time In:  11 Time Out:  12 Time Total:  60  Greater than 50%  of this time was spent counseling and coordinating care related to the above assessment and plan.  Signed by: Rosalin Hawking, MD   Please contact Palliative Medicine Team phone at [phone] for questions and concerns.  For individual provider: See Loretha Stapler

## 2022-10-10 NOTE — Progress Notes (Signed)
[facility] PROGRESS NOTE    Progress Note  Justin Cameron  ZOX:[id number] DOB: Feb 14, 1956 DOA: October 19, 2022 PCP: Daisy Floro, MD     Brief Narrative:   Justin Cameron is an 67 y.o. 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 Oncology on board and she was started empirically on rituximab on 09/20/2022, then stop   Significant Events: 10/19/22: Direct admit from home per medical oncology to [facility] 19-Oct-2022: IR consulted and Port-A-Cath placed 5/24: Started rituximab per oncology 5/28: General surgery consulted for large bowel obstruction in setting of stage IV DLBCL; exploratory laparotomy with right hemicolectomy and end ileostomy by Dr. Luisa Hart; transfer to ICU under PCCM service as remained on ventilatory support following surgery; started on TPN 5/29: IR consulted for IVC filter placement 5/30: Transfused 1 unit PRBC 5/31: Transferred back to hospitalist service 6/3: IR consulted for CT drain placement due to repeat CT findings of large collection in right abdomen and surgical bed; following drain placement patient became tachycardic, hypotensive febrile, PCCM reconsulted and patient was intubated and transferred back to ICU with septic shock with broadening of antibiotics 6/6: GOC discussed, CODE STATUS changed to DNR, extubated 6/7: Weaning stress dose steroids 6/8: Transferred back to [facility] 6/11 : Wound VAC was discontinued due to ongoing clogging  Assessment/Plan:   Septic shock secondary to right lower quadrant E. coli abscess with colonic perforation in the setting of diffuse large B-cell  lymphoma: Oncology on board she is not a candidate for restarting chemotherapy. Surgery on board acutely on clear liquid diet Continue wound care by surgery wet-to-dry. Currently on TPN. Not a candidate for chemotherapy with current open wound. 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 Neurology was consulted to feel strongly about MCA subacute infarct Who recommended an LP if the patient agrees for cytology cells protein and glucose.  Aspirin if okay with surgery. CT of the head and neck, if the patient continues to refuse LP repeat MRI in a few weeks to see evolution.  Adrenal insufficiency: Now wean off steroids.  Acute respiratory failure with hypoxia in the setting of septic shock: Following IR drainage with wound VAC placement. Which require intubation. Successfully extubated on 10/03/2022 Successfully weaned to room air.  History of gout/at risk of tumor lysis syndrome: Medical oncology following.  Lower extremity DVT status post IVC filter: Not a candidate for anticoagulation. IVC filter was placed on 09/25/2022.  Acute Contusional state: Melatonin at night. Now resolved.  A-fib with RVR: In the setting of septic shock treated with IV metoprolol followed by amiodarone these medications are being weaned off. In sinus rhythm on telemetry.  Critical illness anemia: Status post 2 units of packed red blood cells. To keep hemoglobin greater than 8, this morning is 9.1.  Severe protein caloric malnutrition: Currently on TNA surgery allowed her to have a clear liquid diet we will see how she tolerates.  Debility/deconditioning: Continue PT OT anticipate skilled nursing facility needs.   DVT prophylaxis: lovenox Family Communication:none Status is: Inpatient Remains inpatient appropriate because: Large B-cell lymphoma   Code Status:     Code Status Orders  (  From admission,  onward)           Start     Ordered   10/03/22 1114  Do not attempt resuscitation (DNR)  Continuous       Question Answer Comment  If patient has no pulse and is not breathing Do Not Attempt Resuscitation   If patient has a pulse and/or is breathing: Medical Treatment Goals LIMITED ADDITIONAL INTERVENTIONS: Use medication/IV fluids and cardiac monitoring as indicated; Do not use intubation or mechanical ventilation (DNI), also provide comfort medications.  Transfer to Progressive/Stepdown as indicated, avoid Intensive Care.   Consent: Discussion documented in EHR or advanced directives reviewed      10/03/22 1114           Code Status History     Date Active Date Inactive Code Status Order ID Comments User Context   09/30/2022 1826 10/03/2022 1114 Full Code [id number]  [name], NP Inpatient   09/06/2022 1536 09/30/2022 1826 Full Code [id number]  [name] Inpatient   09/11/2022 1656 09/22/2022 1536 Full Code [id number]  [name], MD Inpatient      Advance Directive Documentation    Flowsheet Row Most Recent Value  Type of Advance Directive Healthcare Power of Attorney, Living will  Pre-existing out of facility DNR order (yellow form or pink MOST form) --  "MOST" Form in Place? --         IV Access:   Peripheral IV   Procedures and diagnostic studies:   MR BRAIN W WO CONTRAST  Result Date: 10/08/2022 CLINICAL DATA:  Concern for CNS involvement from high-grade lymphoma EXAM: MRI HEAD WITHOUT AND WITH CONTRAST TECHNIQUE: Multiplanar, multiecho pulse sequences of the brain and surrounding structures were obtained without and with intravenous contrast. CONTRAST:  9mL GADAVIST GADOBUTROL 1 MMOL/ML IV SOLN COMPARISON:  No prior MRI available, correlation is made with CT head 610 2024 FINDINGS: Brain: Gyral enhancement in a sharply demarcated, contiguous area involving the right temporal lobe, occipital lobe, and inferior parietal lobe. These areas  are associated with increased T2 hyperintense signal and gyral swelling, as well as hemosiderin deposition, likely petechial hemorrhage. Some areas of cortex to have increased signal on diffusion-weighted imaging with ADC correlates (for example series 5, image 19 and series 6, image 19), which may be related to susceptibility. Mild mass effect on the right occipital and temporal horns of the lateral ventricle, but no significant midline shift. No other restricted diffusion to suggest acute or subacute infarct. No other parenchymal hemorrhage or midline shift. No hydrocephalus or extra-axial collection. Vascular: Normal arterial flow voids. Normal arterial and venous enhancement. Skull and upper cervical spine: Normal marrow signal. Sinuses/Orbits: Clear paranasal sinuses. No acute finding in the orbits. Other: The mastoid air cells are well aerated. IMPRESSION: Gyral enhancement in a sharply demarcated, contiguous area involving the right temporal lobe, occipital lobe, and inferior parietal lobe, with associated gyral swelling and hemosiderin deposition, likely petechial hemorrhage. This appears localized to the posterior MCA territory, and is favored to represent a subacute infarct, although lymphomatous involvement could appear similar. These results will be called to the ordering clinician or representative by the Radiologist Assistant, and communication documented in the PACS or [employer]. Electronically Signed   By: Wiliam Ke M.D.   On: 10/08/2022 16:48     Medical Consultants:   None.   Subjective:    Justin Cameron awake this am  Objective:    Vitals:   10/10/22 0300 10/10/22 0400  10/10/22 0500 10/10/22 0600  BP:  (!) 121/51 111/63 (!) 112/57  Pulse:  89 87 89  Resp:  14 (!) 24 18  Temp: 97.8 F (36.6 C)     TempSrc: Oral     SpO2:  96% 96% 97%  Weight:   88.8 kg   Height:       SpO2: 97 % O2 Flow Rate (L/min): 2 L/min FiO2 (%): 60 %   Intake/Output Summary  (Last 24 hours) at 10/10/2022 0643 Last data filed at 10/10/2022 0600 Gross per 24 hour  Intake 2203.86 ml  Output 2360 ml  Net -156.14 ml    Filed Weights   10/08/22 0500 10/09/22 0500 10/10/22 0500  Weight: 87.8 kg 90.5 kg 88.8 kg    Exam: General exam: In no acute distress. Respiratory system: Good air movement and clear to auscultation. Cardiovascular system: S1 & S2 heard, RRR. No JVD. Gastrointestinal system: Abdomen is nondistended, soft and dressing of abd. Wall. Extremities: No pedal edema. Skin: No rashes, lesions or ulcers Psychiatry: Judgement and insight appear normal. Mood & affect appropriate.  Data Reviewed:    Labs: Basic Metabolic Panel: Recent Labs  Lab 10/05/22 1837 10/06/22 0305 10/07/22 0615 10/08/22 0212 10/09/22 0547 10/10/22 0529  NA  --  147* 145 144 136 134*  K  --  3.5 3.4* 3.0* 3.3* 3.5  CL  --  114* 113* 112* 105 101  CO2  --  25 25 24 22  21*  GLUCOSE  --  125* 103* 104* 92 94  BUN  --  28* 28* 30* 23 22  CREATININE  --  0.50* 0.57* 0.51* 0.52* 0.58*  CALCIUM  --  8.1* 8.1* 8.0* 7.9* 8.0*  MG 2.2 2.3 2.0  --  1.9 1.8  PHOS 5.3* 4.5 3.2 3.4 3.0 4.1    GFR Estimated Creatinine Clearance: 98.4 mL/min (A) (by C-G formula based on SCr of 0.58 mg/dL (L)). Liver Function Tests: Recent Labs  Lab 10/03/22 0702 10/07/22 0615 10/10/22 0529  AST 18 25 27   ALT 13 19 19   ALKPHOS 122 116 112  BILITOT 0.8 1.1 0.9  PROT 5.2* 5.0* 5.0*  ALBUMIN 2.1* 1.9* 2.0*    No results for input(s): "LIPASE", "AMYLASE" in the last 168 hours. No results for input(s): "AMMONIA" in the last 168 hours. Coagulation profile No results for input(s): "INR", "PROTIME" in the last 168 hours. COVID-19 Labs  No results for input(s): "DDIMER", "FERRITIN", "LDH", "CRP" in the last 72 hours.  No results found for: "SARSCOV2NAA"  CBC: Recent Labs  Lab 10/04/22 0436 10/05/22 0554 10/05/22 1837 10/06/22 0305 10/07/22 0615 10/08/22 0212 10/09/22 0547  WBC  18.9* 12.1*  --  13.6* 11.9* 10.5 8.6  NEUTROABS 16.8* 9.6*  --   --   --  8.8* 6.7  HGB 7.1* 6.8* 8.8* 8.8* 9.2* 8.7* 9.1*  HCT 22.2* 21.8* 28.4* 28.3* 30.4* 28.2* 29.8*  MCV 80.7 82.3  --  83.2 85.2 85.2 83.2  PLT 250 284  --  293 291 257 235    Cardiac Enzymes: No results for input(s): "CKTOTAL", "CKMB", "CKMBINDEX", "TROPONINI" in the last 168 hours. BNP (last 3 results) No results for input(s): "PROBNP" in the last 8760 hours. CBG: Recent Labs  Lab 10/09/22 0552 10/09/22 1228 10/09/22 1840 10/09/22 2332 10/10/22 0606  GLUCAP 88 105* 86 92 96    D-Dimer: No results for input(s): "DDIMER" in the last 72 hours. Hgb A1c: No results for input(s): "HGBA1C" in the last 72 hours.  Lipid Profile: No results for input(s): "CHOL", "HDL", "LDLCALC", "TRIG", "CHOLHDL", "LDLDIRECT" in the last 72 hours.  Thyroid function studies: No results for input(s): "TSH", "T4TOTAL", "T3FREE", "THYROIDAB" in the last 72 hours.  Invalid input(s): "FREET3" Anemia work up: No results for input(s): "VITAMINB12", "FOLATE", "FERRITIN", "TIBC", "IRON", "RETICCTPCT" in the last 72 hours. Sepsis Labs: Recent Labs  Lab 10/06/22 0305 10/07/22 0615 10/08/22 0212 10/09/22 0547  WBC 13.6* 11.9* 10.5 8.6    Microbiology Recent Results (from the past 240 hour(s))  Body fluid culture w Gram Stain     Status: None   Collection Time: 10/01/22  5:40 PM   Specimen: JP Drain; Body Fluid  Result Value Ref Range Status   Specimen Description   Final    JP DRAINAGE RIGHT PELVIC Performed at [facility], [address] [place], [place] [address]    Special Requests   Final    Normal Performed at [facility], [address], [place], [place] [address]    Gram Stain   Final    MODERATE WBC PRESENT, PREDOMINANTLY PMN FEW GRAM NEGATIVE RODS RARE GRAM POSITIVE COCCI RARE GRAM POSITIVE RODS Performed at [facility], [address] [place], [place]  [address]    Culture MODERATE ESCHERICHIA COLI  Final   Report Status 10/03/2022 FINAL  Final   Organism ID, Bacteria ESCHERICHIA COLI  Final      Susceptibility   Escherichia coli - MIC*    AMPICILLIN >=32 RESISTANT Resistant     CEFEPIME <=0.12 SENSITIVE Sensitive     CEFTAZIDIME <=1 SENSITIVE Sensitive     CEFTRIAXONE 0.5 SENSITIVE Sensitive     CIPROFLOXACIN <=0.25 SENSITIVE Sensitive     GENTAMICIN <=1 SENSITIVE Sensitive     IMIPENEM <=0.25 SENSITIVE Sensitive     TRIMETH/SULFA <=20 SENSITIVE Sensitive     AMPICILLIN/SULBACTAM >=32 RESISTANT Resistant     PIP/TAZO 8 SENSITIVE Sensitive     * MODERATE ESCHERICHIA COLI     Medications:    allopurinol  100 mg Oral BID   Chlorhexidine Gluconate Cloth  6 each Topical Daily   docusate  100 mg Per Tube BID   feeding supplement  237 mL Oral BID BM   feeding supplement (PROSource TF20)  60 mL Per Tube BID   insulin aspart  0-9 Units Subcutaneous Q6H   polyethylene glycol  17 g Per Tube Daily   sodium chloride flush  10-40 mL Intracatheter Q12H   sodium chloride flush  5 mL Intracatheter Q8H   Continuous Infusions:  sodium chloride Stopped (10/02/22 2010)   sodium chloride 10 mL/hr at 10/10/22 0215   cefTRIAXone (ROCEPHIN)  IV Stopped (10/09/22 1450)   TPN ADULT (ION) 80 mL/hr at 10/10/22 0215      LOS: 21 days   Justin Cameron  [facility]  10/10/2022, 6:43 AM

## 2022-10-10 NOTE — Progress Notes (Signed)
Physical Therapy Treatment Patient Details Name: Justin Cameron MRN: [id number] DOB: [birth year] Today's Date: 10/10/2022   History of Present Illness 67 y.o. male with a history of OSA, newly diagnosed metastatic DLBCL who [facility] for ongoing abdominal pain, bloating, unintentional weight loss, night sweats. He had been seen in the ED 5/22 where CT showed an enlarging mass at the hepatic flexure, increased pericolonic ft stranding, increased mesenteric lymphadenopathy, enlarging bilateral adrenal masses, and new mass along crus of left diaphragm.  On 10-09-2022, pt s/p Exploratory laparotomy with right hemicolectomy and end ileostomy.  On 09/25/22, pt s/p IVC filter placement    PT Comments    Pt lethargic this pm having just received Fentanyl for pain.  Pt performed bil LE/UE bed exercises with pt intermittently assisting.  OOB deferred for safety 2* pts ongoing lethagic state.   Recommendations for follow up therapy are one component of a multi-disciplinary discharge planning process, led by the attending physician.  Recommendations may be updated based on patient status, additional functional criteria and insurance authorization.  Follow Up Recommendations  Can patient physically be transported by private vehicle: No    Assistance Recommended at Discharge Intermittent Supervision/Assistance  Patient can return home with the following A little help with walking and/or transfers;Assistance with cooking/housework;Help with stairs or ramp for entrance;A lot of help with bathing/dressing/bathroom;Assist for transportation   Equipment Recommendations  Rolling walker (2 wheels)    Recommendations for Other Services       Precautions / Restrictions Precautions Precautions: Fall Precaution Comments: multiple lines/leads, JP drain, ileostomy Restrictions Weight Bearing Restrictions: No     Mobility  Bed Mobility                    Transfers                         Ambulation/Gait                   Stairs             Wheelchair Mobility    Modified Rankin (Stroke Patients Only)       Balance                                            Cognition Arousal/Alertness: Lethargic, Suspect due to medications (recently recieved Fentanyl)                                              Exercises General Exercises - Upper Extremity Shoulder Flexion: AAROM, PROM, Both, 15 reps, Supine Elbow Flexion: AAROM, Both, 15 reps, Supine Elbow Extension: AROM, AAROM, Both, 15 reps, Supine General Exercises - Lower Extremity Ankle Circles/Pumps: AROM, Both, 10 reps, Supine Heel Slides: AAROM, PROM, Both, 15 reps, Supine Hip ABduction/ADduction: AAROM, Both, 10 reps, Supine    General Comments        Pertinent Vitals/Pain Pain Assessment Pain Assessment: Faces Faces Pain Scale: No hurt    Home Living                          Prior Function  PT Goals (current goals can now be found in the care plan section) Acute Rehab PT Goals Time For Goal Achievement: 10/10/22 Potential to Achieve Goals: Good Progress towards PT goals: Not progressing toward goals - comment (Lethargic 2* meds)    Frequency    Min 1X/week      PT Plan Current plan remains appropriate    Co-evaluation              AM-PAC PT "6 Clicks" Mobility   Outcome Measure  Help needed turning from your back to your side while in a flat bed without using bedrails?: A Little Help needed moving from lying on your back to sitting on the side of a flat bed without using bedrails?: A Lot Help needed moving to and from a bed to a chair (including a wheelchair)?: A Little Help needed standing up from a chair using your arms (e.g., wheelchair or bedside chair)?: A Lot Help needed to walk in hospital room?: Total Help needed climbing 3-5 steps with a railing? : Total 6 Click  Score: 12    End of Session   Activity Tolerance: Patient limited by fatigue Patient left: in bed;with call bell/phone within reach;with bed alarm set;with family/visitor present Nurse Communication: Mobility status PT Visit Diagnosis: Difficulty in walking, not elsewhere classified (R26.2)     Time: 6045-4098 PT Time Calculation (min) (ACUTE ONLY): 20 min  Charges:  $Therapeutic Exercise: 8-22 mins                     Mauro Kaufmann PT Acute Rehabilitation Services Pager [phone] Office [phone]    Justin Cameron 10/10/2022, 2:50 PM

## 2022-10-10 NOTE — Progress Notes (Signed)
Chaplain followed up with Mrs. Darwish at the bedside. She discussed what the plan is for [facility] right now as well as the options that exist. Mrs. Prestwood was grateful that Dr. Linna Darner had provided space for [facility] to vocalize his needs so that they can uphold his desires and wants. She is continuing to lean on their faith and trust whatever God decides. Mrs. Brumm is in a place of knowing that they won't allow Mattie to go through anymore if it will not give him more time and quality of life.   While in the room earlier today, Marc had hiccups that led to feeling a great deal of pain at level 8. Chaplain and wife notified the nurse.   Chaplains continue to offer supportive presence and support.   10/10/22 1500  Spiritual Encounters  Type of Visit Follow up  Care provided to: Pt and family

## 2022-10-10 NOTE — Progress Notes (Signed)
Assessment & Plan: POD#16 - s/p ex lap with right colectomy and end ileostomy - TC B cell lymphoma             - full liquid diet - cleared by speech path - continue wound care - wet to dry dressing changes - IR drain  Extensive discussions yesterday with patient and wife by medical oncology, neurology, and palliative care.  Tentatively planning LP to evaluate for CNS involvement with lymphoma.  IR to see patient and wife today to discuss.  Will follow.  Discussed with wife at bedside.        [name], MD [facility] practice Office: [phone]        Chief Complaint: B cell lymphoma, colonic perforation  Subjective: Patient in bed, sedated, comfortable.  Wife at bedside.  Objective: Vital signs in last 24 hours: Temp:  [97.8 F (36.6 C)-98.8 F (37.1 C)] 98.8 F (37.1 C) (06/13 0955) Pulse Rate:  [87-113] 93 (06/13 0700) Resp:  [14-26] 17 (06/13 0700) BP: (92-124)/(40-94) 105/51 (06/13 0700) SpO2:  [90 %-100 %] 98 % (06/13 0700) Weight:  [88.8 kg] 88.8 kg (06/13 0500) Last BM Date : 10/09/22  Intake/Output from previous day: 06/12 0701 - 06/13 0700 In: 2417.2 [I.V.:1868.4; NG/GT:140; IV Piggyback:398.8] Out: 2585 [Urine:1250; Drains:160; Stool:1175] Intake/Output this shift: Total I/O In: 395.9 [I.V.:342.8; IV Piggyback:53.1] Out: -   Physical Exam: Abdomen - dressing dry and intact; thin succus from ostomy; drains with moderate output  Lab Results:  Recent Labs    10/08/22 0212 10/09/22 0547  WBC 10.5 8.6  HGB 8.7* 9.1*  HCT 28.2* 29.8*  PLT 257 235   BMET Recent Labs    10/09/22 0547 10/10/22 0529  NA 136 134*  K 3.3* 3.5  CL 105 101  CO2 22 21*  GLUCOSE 92 94  BUN 23 22  CREATININE 0.52* 0.58*  CALCIUM 7.9* 8.0*   PT/INR No results for input(s): "LABPROT", "INR" in the last 72 hours. Comprehensive Metabolic Panel:    Component Value Date/Time   NA 134 (L) 10/10/2022 0529   NA 136 10/09/2022 0547   K  3.5 10/10/2022 0529   K 3.3 (L) 10/09/2022 0547   CL 101 10/10/2022 0529   CL 105 10/09/2022 0547   CO2 21 (L) 10/10/2022 0529   CO2 22 10/09/2022 0547   BUN 22 10/10/2022 0529   BUN 23 10/09/2022 0547   CREATININE 0.58 (L) 10/10/2022 0529   CREATININE 0.52 (L) 10/09/2022 0547   GLUCOSE 94 10/10/2022 0529   GLUCOSE 92 10/09/2022 0547   CALCIUM 8.0 (L) 10/10/2022 0529   CALCIUM 7.9 (L) 10/09/2022 0547   AST 27 10/10/2022 0529   AST 25 10/07/2022 0615   ALT 19 10/10/2022 0529   ALT 19 10/07/2022 0615   ALKPHOS 112 10/10/2022 0529   ALKPHOS 116 10/07/2022 0615   BILITOT 0.9 10/10/2022 0529   BILITOT 1.1 10/07/2022 0615   PROT 5.0 (L) 10/10/2022 0529   PROT 5.0 (L) 10/07/2022 0615   ALBUMIN 2.0 (L) 10/10/2022 0529   ALBUMIN 1.9 (L) 10/07/2022 0615    Studies/Results: MR BRAIN W WO CONTRAST  Result Date: 10/08/2022 CLINICAL DATA:  Concern for CNS involvement from high-grade lymphoma EXAM: MRI HEAD WITHOUT AND WITH CONTRAST TECHNIQUE: Multiplanar, multiecho pulse sequences of the brain and surrounding structures were obtained without and with intravenous contrast. CONTRAST:  9mL GADAVIST GADOBUTROL 1 MMOL/ML IV SOLN COMPARISON:  No prior MRI available, correlation is made with  CT head 610 2024 FINDINGS: Brain: Gyral enhancement in a sharply demarcated, contiguous area involving the right temporal lobe, occipital lobe, and inferior parietal lobe. These areas are associated with increased T2 hyperintense signal and gyral swelling, as well as hemosiderin deposition, likely petechial hemorrhage. Some areas of cortex to have increased signal on diffusion-weighted imaging with ADC correlates (for example series 5, image 19 and series 6, image 19), which may be related to susceptibility. Mild mass effect on the right occipital and temporal horns of the lateral ventricle, but no significant midline shift. No other restricted diffusion to suggest acute or subacute infarct. No other parenchymal  hemorrhage or midline shift. No hydrocephalus or extra-axial collection. Vascular: Normal arterial flow voids. Normal arterial and venous enhancement. Skull and upper cervical spine: Normal marrow signal. Sinuses/Orbits: Clear paranasal sinuses. No acute finding in the orbits. Other: The mastoid air cells are well aerated. IMPRESSION: Gyral enhancement in a sharply demarcated, contiguous area involving the right temporal lobe, occipital lobe, and inferior parietal lobe, with associated gyral swelling and hemosiderin deposition, likely petechial hemorrhage. This appears localized to the posterior MCA territory, and is favored to represent a subacute infarct, although lymphomatous involvement could appear similar. These results will be called to the ordering clinician or representative by the Radiologist Assistant, and communication documented in the PACS or [employer]. Electronically Signed   By: Wiliam Ke M.D.   On: 10/08/2022 16:48      [name] 10/10/2022  Patient ID: Justin Cameron, male   DOB: 06/21/1955, 67 y.o.   MRN: [id number]

## 2022-10-10 NOTE — Plan of Care (Signed)
Discussed with patient and wife plan of care for the evening, pain management and radiology with some teach back displayed.  What is important to the patient is the MD discussion in the morning CTA vs. Lumbar   Problem: Education: Goal: Knowledge of General Education information will improve Description: Including pain rating scale, medication(s)/side effects and non-pharmacologic comfort measures Outcome: Progressing   Problem: Health Behavior/Discharge Planning: Goal: Ability to manage health-related needs will improve Outcome: Progressing   Problem: Coping: Goal: Level of anxiety will decrease Outcome: Progressing   Problem: Pain Managment: Goal: General experience of comfort will improve Outcome: Progressing

## 2022-10-11 ENCOUNTER — Inpatient Hospital Stay (HOSPITAL_COMMUNITY): Payer: Medicare Other

## 2022-10-11 DIAGNOSIS — F05 Delirium due to known physiological condition: Secondary | ICD-10-CM

## 2022-10-11 DIAGNOSIS — C8330 Diffuse large B-cell lymphoma, unspecified site: Secondary | ICD-10-CM | POA: Diagnosis not present

## 2022-10-11 DIAGNOSIS — G893 Neoplasm related pain (acute) (chronic): Secondary | ICD-10-CM | POA: Diagnosis not present

## 2022-10-11 DIAGNOSIS — F19982 Other psychoactive substance use, unspecified with psychoactive substance-induced sleep disorder: Secondary | ICD-10-CM | POA: Diagnosis not present

## 2022-10-11 DIAGNOSIS — Z7189 Other specified counseling: Secondary | ICD-10-CM | POA: Diagnosis not present

## 2022-10-11 DIAGNOSIS — K5903 Drug induced constipation: Secondary | ICD-10-CM | POA: Diagnosis not present

## 2022-10-11 DIAGNOSIS — Z515 Encounter for palliative care: Secondary | ICD-10-CM

## 2022-10-11 DIAGNOSIS — K269 Duodenal ulcer, unspecified as acute or chronic, without hemorrhage or perforation: Secondary | ICD-10-CM

## 2022-10-11 DIAGNOSIS — E860 Dehydration: Secondary | ICD-10-CM

## 2022-10-11 LAB — CBC WITH DIFFERENTIAL/PLATELET
Abs Immature Granulocytes: 0.14 10*3/uL — ABNORMAL HIGH (ref 0.00–0.07)
Basophils Absolute: 0.1 10*3/uL (ref 0.0–0.1)
Basophils Relative: 1 %
Eosinophils Absolute: 0.5 10*3/uL (ref 0.0–0.5)
Eosinophils Relative: 5 %
HCT: 32.6 % — ABNORMAL LOW (ref 39.0–52.0)
Hemoglobin: 10 g/dL — ABNORMAL LOW (ref 13.0–17.0)
Immature Granulocytes: 1 %
Lymphocytes Relative: 5 %
Lymphs Abs: 0.5 10*3/uL — ABNORMAL LOW (ref 0.7–4.0)
MCH: 25.3 pg — ABNORMAL LOW (ref 26.0–34.0)
MCHC: 30.7 g/dL (ref 30.0–36.0)
MCV: 82.5 fL (ref 80.0–100.0)
Monocytes Absolute: 1 10*3/uL (ref 0.1–1.0)
Monocytes Relative: 10 %
Neutro Abs: 7.8 10*3/uL — ABNORMAL HIGH (ref 1.7–7.7)
Neutrophils Relative %: 78 %
Platelets: 271 10*3/uL (ref 150–400)
RBC: 3.95 MIL/uL — ABNORMAL LOW (ref 4.22–5.81)
RDW: 17.5 % — ABNORMAL HIGH (ref 11.5–15.5)
WBC: 10.1 10*3/uL (ref 4.0–10.5)
nRBC: 0 % (ref 0.0–0.2)

## 2022-10-11 LAB — BASIC METABOLIC PANEL
Anion gap: 10 (ref 5–15)
BUN: 21 mg/dL (ref 8–23)
CO2: 21 mmol/L — ABNORMAL LOW (ref 22–32)
Calcium: 7.9 mg/dL — ABNORMAL LOW (ref 8.9–10.3)
Chloride: 100 mmol/L (ref 98–111)
Creatinine, Ser: 0.44 mg/dL — ABNORMAL LOW (ref 0.61–1.24)
GFR, Estimated: >60 mL/min (ref >60)
Glucose, Bld: 103 mg/dL — ABNORMAL HIGH (ref 70–99)
Potassium: 4.2 mmol/L (ref 3.5–5.1)
Sodium: 131 mmol/L — ABNORMAL LOW (ref 135–145)

## 2022-10-11 LAB — CBC
HCT: 30.9 % — ABNORMAL LOW (ref 39.0–52.0)
Hemoglobin: 9.6 g/dL — ABNORMAL LOW (ref 13.0–17.0)
MCH: 25.7 pg — ABNORMAL LOW (ref 26.0–34.0)
MCHC: 31.1 g/dL (ref 30.0–36.0)
MCV: 82.8 fL (ref 80.0–100.0)
Platelets: 240 10*3/uL (ref 150–400)
RBC: 3.73 MIL/uL — ABNORMAL LOW (ref 4.22–5.81)
RDW: 17.5 % — ABNORMAL HIGH (ref 11.5–15.5)
WBC: 10.7 10*3/uL — ABNORMAL HIGH (ref 4.0–10.5)
nRBC: 0 % (ref 0.0–0.2)

## 2022-10-11 LAB — MAGNESIUM: Magnesium: 2 mg/dL (ref 1.7–2.4)

## 2022-10-11 LAB — PHOSPHORUS: Phosphorus: 3.7 mg/dL (ref 2.5–4.6)

## 2022-10-11 MED ORDER — LORAZEPAM 2 MG/ML IJ SOLN
1.0000 mg | INTRAMUSCULAR | Status: DC | PRN
  Administered 2022-10-11: 1 mg via INTRAVENOUS
  Filled 2022-10-11: qty 1

## 2022-10-11 MED ORDER — TRAVASOL 10 % IV SOLN
INTRAVENOUS | Status: DC
  Filled 2022-10-11: qty 998.4

## 2022-10-11 MED ORDER — MORPHINE BOLUS VIA INFUSION
4.0000 mg | INTRAVENOUS | Status: DC | PRN
  Administered 2022-10-11 (×7): 4 mg via INTRAVENOUS

## 2022-10-11 MED ORDER — ONDANSETRON HCL 4 MG/2ML IJ SOLN: 4.0000 mg | Freq: Four times a day (QID) | INTRAMUSCULAR | Status: DC | PRN

## 2022-10-11 MED ORDER — HALOPERIDOL 1 MG PO TABS: 0.5000 mg | ORAL_TABLET | ORAL | Status: DC | PRN

## 2022-10-11 MED ORDER — PIPERACILLIN-TAZOBACTAM 3.375 G IVPB
3.3750 g | Freq: Three times a day (TID) | INTRAVENOUS | Status: DC
  Administered 2022-10-11: 3.375 g via INTRAVENOUS
  Filled 2022-10-11: qty 50

## 2022-10-11 MED ORDER — ACETAMINOPHEN 325 MG PO TABS: 650.0000 mg | ORAL_TABLET | Freq: Four times a day (QID) | ORAL | Status: DC | PRN

## 2022-10-11 MED ORDER — IOHEXOL 350 MG/ML SOLN
100.0000 mL | Freq: Once | INTRAVENOUS | Status: AC | PRN
  Administered 2022-10-11: 100 mL via INTRAVENOUS

## 2022-10-11 MED ORDER — IBUPROFEN 100 MG/5ML PO SUSP
400.0000 mg | Freq: Four times a day (QID) | ORAL | Status: DC | PRN
  Administered 2022-10-11: 400 mg
  Filled 2022-10-11 (×2): qty 20

## 2022-10-11 MED ORDER — ONDANSETRON 4 MG PO TBDP: 4.0000 mg | ORAL_TABLET | Freq: Four times a day (QID) | ORAL | Status: DC | PRN

## 2022-10-11 MED ORDER — HALOPERIDOL LACTATE 2 MG/ML PO CONC: 0.5000 mg | ORAL | Status: DC | PRN

## 2022-10-11 MED ORDER — GLYCOPYRROLATE 1 MG PO TABS: 1.0000 mg | ORAL_TABLET | ORAL | Status: DC | PRN

## 2022-10-11 MED ORDER — HALOPERIDOL LACTATE 5 MG/ML IJ SOLN: 0.5000 mg | INTRAMUSCULAR | Status: DC | PRN

## 2022-10-11 MED ORDER — ACETAMINOPHEN 650 MG RE SUPP: 650.0000 mg | Freq: Four times a day (QID) | RECTAL | Status: DC | PRN

## 2022-10-11 MED ORDER — GLYCOPYRROLATE 0.2 MG/ML IJ SOLN: 0.2000 mg | INTRAMUSCULAR | Status: DC | PRN

## 2022-10-11 MED ORDER — LORAZEPAM 1 MG PO TABS: 1.0000 mg | ORAL_TABLET | ORAL | Status: DC | PRN

## 2022-10-11 MED ORDER — SODIUM CHLORIDE 0.9 % IV BOLUS
1000.0000 mL | Freq: Once | INTRAVENOUS | Status: AC
  Administered 2022-10-11: 1000 mL via INTRAVENOUS

## 2022-10-11 MED ORDER — IOHEXOL 9 MG/ML PO SOLN
500.0000 mL | ORAL | Status: AC
  Administered 2022-10-11: 500 mL via ORAL

## 2022-10-11 MED ORDER — LORAZEPAM 2 MG/ML PO CONC: 1.0000 mg | ORAL | Status: DC | PRN

## 2022-10-11 MED ORDER — POLYVINYL ALCOHOL 1.4 % OP SOLN: 1.0000 [drp] | Freq: Four times a day (QID) | OPHTHALMIC | Status: DC | PRN

## 2022-10-11 MED ORDER — IBUPROFEN 200 MG PO TABS: 400.0000 mg | ORAL_TABLET | Freq: Four times a day (QID) | ORAL | Status: DC | PRN

## 2022-10-11 MED ORDER — HALOPERIDOL LACTATE 5 MG/ML IJ SOLN: 0.5000 mg | Freq: Once | INTRAMUSCULAR | Status: DC

## 2022-10-11 MED ORDER — PIPERACILLIN-TAZOBACTAM 3.375 G IVPB 30 MIN: 3.3750 g | Freq: Four times a day (QID) | INTRAVENOUS | Status: DC

## 2022-10-11 MED ORDER — BIOTENE DRY MOUTH MT LIQD: 15.0000 mL | OROMUCOSAL | Status: DC | PRN

## 2022-10-11 MED ORDER — MORPHINE 100MG IN NS 100ML (1MG/ML) PREMIX INFUSION
3.0000 mg/h | INTRAVENOUS | Status: DC
  Administered 2022-10-11: 1 mg/h via INTRAVENOUS
  Filled 2022-10-11: qty 100

## 2022-10-12 LAB — CULTURE, BLOOD (ROUTINE X 2): Culture: NO GROWTH

## 2022-10-13 LAB — CULTURE, BLOOD (ROUTINE X 2): Special Requests: ADEQUATE

## 2022-10-15 LAB — CULTURE, BLOOD (ROUTINE X 2)

## 2022-10-16 LAB — CULTURE, BLOOD (ROUTINE X 2)
Culture: NO GROWTH
Special Requests: ADEQUATE

## 2022-10-28 NOTE — Progress Notes (Signed)
   10/12/2022 1300  Spiritual Encounters  Type of Visit Follow up  Care provided to: Pt and family  Conversation partners present during encounter Nurse (Dr. Linna Darner for [facility])  Referral source Nurse (RN/NT/LPN)  Reason for visit Urgent spiritual support  OnCall Visit No  Spiritual Framework  Presenting Themes Meaning/purpose/sources of inspiration;Significant life change;Coping tools;Caregiving needs  Values/beliefs  (Christian Beleif  Vsit from [place] later today)  Community/Connection Family;Spiritual leader;Faith community  Patient Stress Factors Exhausted  Family Stress Factors Loss of control  Interventions  Spiritual Care Interventions Made Reflective listening;Compassionate presence;Bereavement/grief support;Meaning making  Intervention Outcomes  Outcomes Awareness of health  Spiritual Care Plan  Spiritual Care Issues Still Outstanding Chaplain will continue to follow     Chaplain met with patient and his wife at bedside. - Pallaivtive doctor Anr and Nurse were present.  Supported family in their discussion of illness and prognosis at this time.  Spoke of his faith and how it helped him.  Visit from Daughter and [place] this fternon  Ended visit with a departing blessings.

## 2022-10-28 NOTE — Progress Notes (Addendum)
[facility] PROGRESS NOTE    Progress Note  [name]  HQI:[id number] DOB: 03-17-1956 DOA: 09/12/2022 PCP: Daisy Floro, MD     Brief Narrative:   Justin Cameron is an 67 y.o. 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 Oncology on board and she was started empirically on rituximab on 09/20/2022, then stop   Significant Events: 5/23: Direct admit from home per medical oncology to [facility] 5/23: IR consulted and Port-A-Cath placed 5/24: Started rituximab per oncology October 08, 2022: General surgery consulted for large bowel obstruction in setting of stage IV DLBCL; exploratory laparotomy with right hemicolectomy and end ileostomy by Dr. Luisa Hart; transfer to ICU under PCCM service as remained on ventilatory support following surgery; started on TPN 5/29: IR consulted for IVC filter placement 5/30: Transfused 1 unit PRBC 5/31: Transferred back to hospitalist service 6/3: IR consulted for CT drain placement due to repeat CT findings of large collection in right abdomen and surgical bed; following drain placement patient became tachycardic, hypotensive febrile, PCCM reconsulted and patient was intubated and transferred back to ICU with septic shock with broadening of antibiotics 6/6: GOC discussed, CODE STATUS changed to DNR, extubated 6/7: Weaning stress dose steroids 6/8: Transferred back to [facility] 6/11 : Wound VAC was discontinued due to ongoing clogging  Assessment/Plan:   Septic shock secondary to right lower quadrant E. coli abscess with colonic perforation in the setting of diffuse large B-cell  lymphoma: Oncology on board she is not a candidate for restarting chemotherapy. Surgery on board, diet advanced to full liquid. Continue wound care by surgery wet-to-dry. Currently on TPN. Not a candidate for chemotherapy with current open wound. Concern about wound dehiscence. Spiked a temperature, 101.6, white blood cell count is increasing. Will go ahead and broaden antibiotic coverage. Having good urine output.  Check a chest x-ray and check blood cultures.  Lymphomatous brain abnormalities : MRI showed right occipital and inferior parietal involvement. Neurology was consulted to feel strongly about MCA subacute infarct Who recommended an LP if the patient agrees for cytology cells protein and glucose.   Aspirin if okay with surgery. CT of the head and neck, if the patient continues to refuse LP repeat MRI in a few weeks to see evolution. Patient related on 10/14/2022 that he would like to avoid LP for now.  Acute respiratory failure with hypoxia in the setting of septic shock: Which require intubation. Successfully extubated on 10/03/2022 Now on 2 L of oxygen.  History of gout/at risk of tumor lysis syndrome: Medical oncology following.  Lower extremity DVT status post IVC filter: Not a candidate for anticoagulation. IVC filter was placed on 09/25/2022.  Acute Contusional state: Melatonin at night. Now resolved.  A-fib with RVR: In the setting of septic shock treated with IV metoprolol followed by amiodarone these medications are being weaned off. In sinus rhythm on telemetry.  Critical illness anemia/acute blood loss anemia: Status post 2 units of packed red blood cells. To keep hemoglobin greater than 8, this morning is 9.1.  Severe protein caloric malnutrition: Currently on TNA surgery allowed her to have a clear liquid diet we will see how she tolerates.  Debility/deconditioning: Continue PT  OT anticipate skilled nursing facility needs.  Adrenal  insufficiency: Now wean off steroids.   DVT prophylaxis: SCD Family Communication:none Status is: Inpatient Remains inpatient appropriate because: Large B-cell lymphoma   Code Status:     Code Status Orders  (From admission, onward)           Start     Ordered   10/03/22 1114  Do not attempt resuscitation (DNR)  Continuous       Question Answer Comment  If patient has no pulse and is not breathing Do Not Attempt Resuscitation   If patient has a pulse and/or is breathing: Medical Treatment Goals LIMITED ADDITIONAL INTERVENTIONS: Use medication/IV fluids and cardiac monitoring as indicated; Do not use intubation or mechanical ventilation (DNI), also provide comfort medications.  Transfer to Progressive/Stepdown as indicated, avoid Intensive Care.   Consent: Discussion documented in EHR or advanced directives reviewed      10/03/22 1114           Code Status History     Date Active Date Inactive Code Status Order ID Comments User Context   09/30/2022 1826 10/03/2022 1114 Full Code [id number]  [name], NP Inpatient   09/21/2022 1536 09/30/2022 1826 Full Code [id number]  [name] Inpatient   09/13/2022 1656 09/10/2022 1536 Full Code [id number]  [name], MD Inpatient      Advance Directive Documentation    Flowsheet Row Most Recent Value  Type of Advance Directive Healthcare Power of Attorney, Living will  Pre-existing out of facility DNR order (yellow form or pink MOST form) --  "MOST" Form in Place? --         IV Access:   Peripheral IV   Procedures and diagnostic studies:   No results found.   Medical Consultants:   None.   Subjective:    [name] he had a really good conversation this morning he has no new complaints he wants his diet advanced  Objective:    Vitals:   2022/11/03 0400 2022/11/03 0500 11/03/2022 0533 11/03/2022 0600  BP: (!) 102/58 119/62  123/67  Pulse: (!) 109 (!) 114 (!) 112 (!) 119  Resp:  18 17 17 20   Temp:      TempSrc:      SpO2: 94% 93% 95% 94%  Weight:  85.2 kg    Height:       SpO2: 94 % O2 Flow Rate (L/min): 2 L/min FiO2 (%): 36 %   Intake/Output Summary (Last 24 hours) at 2022-11-03 0659 Last data filed at Nov 03, 2022 5638 Gross per 24 hour  Intake 2592.01 ml  Output 3210 ml  Net -617.99 ml    Filed Weights   10/09/22 0500 10/10/22 0500 11/03/22 0500  Weight: 90.5 kg 88.8 kg 85.2 kg    Exam: General exam: In no acute distress. Respiratory system: Good air movement and clear to auscultation. Cardiovascular system: S1 & S2 heard, RRR. No JVD. Gastrointestinal system: Abdomen is nondistended, soft and nontender.  Extremities: No pedal edema. Skin: No rashes, lesions or ulcers Psychiatry: Judgement and insight appear normal. Mood & affect appropriate.  Data Reviewed:    Labs: Basic Metabolic Panel: Recent Labs  Lab 10/06/22 0305 10/07/22 0615 10/08/22 0212 10/09/22 0547 10/10/22 0529 2022-11-03 0326  NA 147* 145 144 136 134* 131*  K 3.5 3.4* 3.0* 3.3* 3.5 4.2  CL 114* 113* 112* 105 101 100  CO2 25 25 24 22  21* 21*  GLUCOSE 125* 103* 104* 92 94  103*  BUN 28* 28* 30* 23 22 21   CREATININE 0.50* 0.57* 0.51* 0.52* 0.58* 0.44*  CALCIUM 8.1* 8.1* 8.0* 7.9* 8.0* 7.9*  MG 2.3 2.0  --  1.9 1.8 2.0  PHOS 4.5 3.2 3.4 3.0 4.1 3.7    GFR Estimated Creatinine Clearance: 96.5 mL/min (A) (by C-G formula based on SCr of 0.44 mg/dL (L)). Liver Function Tests: Recent Labs  Lab 10/07/22 0615 10/10/22 0529  AST 25 27  ALT 19 19  ALKPHOS 116 112  BILITOT 1.1 0.9  PROT 5.0* 5.0*  ALBUMIN 1.9* 2.0*    No results for input(s): "LIPASE", "AMYLASE" in the last 168 hours. No results for input(s): "AMMONIA" in the last 168 hours. Coagulation profile No results for input(s): "INR", "PROTIME" in the last 168 hours. COVID-19 Labs  No results for input(s): "DDIMER", "FERRITIN", "LDH", "CRP" in the last 72 hours.  No results found for:  "SARSCOV2NAA"  CBC: Recent Labs  Lab 10/05/22 0554 10/05/22 1837 10/06/22 0305 10/07/22 0615 10/08/22 0212 10/09/22 0547 10/25/2022 0326  WBC 12.1*  --  13.6* 11.9* 10.5 8.6 10.7*  NEUTROABS 9.6*  --   --   --  8.8* 6.7  --   HGB 6.8*   < > 8.8* 9.2* 8.7* 9.1* 9.6*  HCT 21.8*   < > 28.3* 30.4* 28.2* 29.8* 30.9*  MCV 82.3  --  83.2 85.2 85.2 83.2 82.8  PLT 284  --  293 291 257 235 240   < > = values in this interval not displayed.    Cardiac Enzymes: No results for input(s): "CKTOTAL", "CKMB", "CKMBINDEX", "TROPONINI" in the last 168 hours. BNP (last 3 results) No results for input(s): "PROBNP" in the last 8760 hours. CBG: Recent Labs  Lab 10/09/22 0552 10/09/22 1228 10/09/22 1840 10/09/22 2332 10/10/22 0606  GLUCAP 88 105* 86 92 96    D-Dimer: No results for input(s): "DDIMER" in the last 72 hours. Hgb A1c: No results for input(s): "HGBA1C" in the last 72 hours. Lipid Profile: No results for input(s): "CHOL", "HDL", "LDLCALC", "TRIG", "CHOLHDL", "LDLDIRECT" in the last 72 hours.  Thyroid function studies: No results for input(s): "TSH", "T4TOTAL", "T3FREE", "THYROIDAB" in the last 72 hours.  Invalid input(s): "FREET3" Anemia work up: No results for input(s): "VITAMINB12", "FOLATE", "FERRITIN", "TIBC", "IRON", "RETICCTPCT" in the last 72 hours. Sepsis Labs: Recent Labs  Lab 10/07/22 0615 10/08/22 0212 10/09/22 0547 10/19/2022 0326  WBC 11.9* 10.5 8.6 10.7*    Microbiology Recent Results (from the past 240 hour(s))  Body fluid culture w Gram Stain     Status: None   Collection Time: 10/01/22  5:40 PM   Specimen: JP Drain; Body Fluid  Result Value Ref Range Status   Specimen Description   Final    JP DRAINAGE RIGHT PELVIC Performed at [facility], [address], [place], [place] [address]    Special Requests   Final    Normal Performed at [facility], [address], [place], [place] [address]    Gram Stain    Final    MODERATE WBC PRESENT, PREDOMINANTLY PMN FEW GRAM NEGATIVE RODS RARE GRAM POSITIVE COCCI RARE GRAM POSITIVE RODS Performed at [facility], [address] [place], [place] [address]    Culture MODERATE ESCHERICHIA COLI  Final   Report Status 10/03/2022 FINAL  Final   Organism ID, Bacteria ESCHERICHIA COLI  Final      Susceptibility   Escherichia coli - MIC*    AMPICILLIN >=  32 RESISTANT Resistant     CEFEPIME <=0.12 SENSITIVE Sensitive     CEFTAZIDIME <=1 SENSITIVE Sensitive     CEFTRIAXONE 0.5 SENSITIVE Sensitive     CIPROFLOXACIN <=0.25 SENSITIVE Sensitive     GENTAMICIN <=1 SENSITIVE Sensitive     IMIPENEM <=0.25 SENSITIVE Sensitive     TRIMETH/SULFA <=20 SENSITIVE Sensitive     AMPICILLIN/SULBACTAM >=32 RESISTANT Resistant     PIP/TAZO 8 SENSITIVE Sensitive     * MODERATE ESCHERICHIA COLI     Medications:    allopurinol  100 mg Oral BID   baclofen  5 mg Oral TID   Chlorhexidine Gluconate Cloth  6 each Topical Daily   docusate  100 mg Per Tube BID   feeding supplement  237 mL Oral BID BM   feeding supplement (PROSource TF20)  60 mL Per Tube BID   fentaNYL   Intravenous Q4H   melatonin  3 mg Oral QHS   nutrition supplement (JUVEN)  1 packet Oral BID BM   polyethylene glycol  17 g Per Tube Daily   sodium chloride flush  10-40 mL Intracatheter Q12H   sodium chloride flush  5 mL Intracatheter Q8H   Continuous Infusions:  sodium chloride Stopped (10/02/22 2010)   sodium chloride 10 mL/hr at 10/04/2022 0632   cefTRIAXone (ROCEPHIN)  IV Stopped (10/10/22 1521)   TPN ADULT (ION) 80 mL/hr at 09/29/2022 0865      LOS: 22 days   Marinda Elk  [facility]  10/23/2022, 6:59 AM

## 2022-10-28 NOTE — Progress Notes (Signed)
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  Upon entry patient has become extremely tachy, agitated, hypotensive, etc.    -we are available as needed at this time.  Appreciate all services and their great care of this patient throughout his stay.       Letha Cape, [facility] practice Office: [phone]        Chief Complaint: B cell lymphoma, colonic perforation  Subjective: Patient in bed, agitated and hypotensive.  Objective: Vital signs in last 24 hours: Temp:  [98.4 F (36.9 C)-102.3 F (39.1 C)] 102.3 F (39.1 C) (06/14 1208) Pulse Rate:  [100-153] 153 (06/14 1200) Resp:  [14-29] 21 (06/14 1230) BP: (101-126)/(42-74) 126/42 (06/14 1200) SpO2:  [92 %-99 %] 94 % (06/14 1230) FiO2 (%):  [36 %-54 %] 36 % (06/14 1230) Weight:  [85.2 kg] 85.2 kg (06/14 0500) Last BM Date : 10/02/2022  Intake/Output from previous day: 06/13 0701 - 06/14 0700 In: 2592 [P.O.:90; I.V.:2223.9; NG/GT:120; IV Piggyback:153.1] Out: 3210 [Urine:2000; Drains:60; Stool:1150] Intake/Output this shift: Total I/O In: 789.3 [I.V.:548.6; NG/GT:200; IV Piggyback:40.7] Out: 750 [Urine:450; Stool:300]  Physical Exam: Abdomen - midline wound not evaluated today.  JP drain flushed with 10cc NS and both drains with dark bloody, cloudy output.  Lab Results:  Recent Labs    10/27/2022 0326 10/08/2022 0803  WBC 10.7* 10.1  HGB 9.6* 10.0*  HCT 30.9* 32.6*  PLT 240 271   BMET Recent Labs    10/10/22 0529 10/13/2022 0326  NA 134* 131*  K 3.5 4.2  CL 101 100  CO2 21* 21*  GLUCOSE 94 103*  BUN 22 21  CREATININE 0.58* 0.44*  CALCIUM 8.0* 7.9*   PT/INR No results for input(s): "LABPROT", "INR" in the last 72 hours. Comprehensive Metabolic Panel:    Component Value Date/Time   NA 131 (L) 10/15/2022 0326   NA 134 (L) 10/10/2022 0529   K 4.2  10/23/2022 0326   K 3.5 10/10/2022 0529   CL 100 10/16/2022 0326   CL 101 10/10/2022 0529   CO2 21 (L) 10/10/2022 0326   CO2 21 (L) 10/10/2022 0529   BUN 21 10/03/2022 0326   BUN 22 10/10/2022 0529   CREATININE 0.44 (L) 10/06/2022 0326   CREATININE 0.58 (L) 10/10/2022 0529   GLUCOSE 103 (H) 10/02/2022 0326   GLUCOSE 94 10/10/2022 0529   CALCIUM 7.9 (L) 10/26/2022 0326   CALCIUM 8.0 (L) 10/10/2022 0529   AST 27 10/10/2022 0529   AST 25 10/07/2022 0615   ALT 19 10/10/2022 0529   ALT 19 10/07/2022 0615   ALKPHOS 112 10/10/2022 0529   ALKPHOS 116 10/07/2022 0615   BILITOT 0.9 10/10/2022 0529   BILITOT 1.1 10/07/2022 0615   PROT 5.0 (L) 10/10/2022 0529   PROT 5.0 (L) 10/07/2022 0615   ALBUMIN 2.0 (L) 10/10/2022 0529   ALBUMIN 1.9 (L) 10/07/2022 0615    Studies/Results: CT ABDOMEN PELVIS W CONTRAST  Result Date: 10/09/2022 CLINICAL DATA:  Abdominal pain status post surgery EXAM: CT ABDOMEN AND PELVIS WITH CONTRAST TECHNIQUE: Multidetector CT imaging of the abdomen and pelvis was performed using the standard protocol following bolus administration of intravenous contrast. RADIATION DOSE REDUCTION: This exam was performed according to the departmental dose-optimization program which includes automated exposure control, adjustment of the  mA and/or kV according to patient size and/or use of iterative reconstruction technique. CONTRAST:  OMNIPAQUE IOHEXOL 350 MG/ML SOLN COMPARISON:  09/29/2022 FINDINGS: Lower chest: Trace bilateral pleural effusions. Bibasilar atelectasis. Hepatobiliary: Multiple hypodense fluid attenuating hepatic masses consistent with small cysts. 4.4 cm subcapsular fluid collection with a small amount air along the peripheral aspect of the right hepatic lobe consistent with an abscess similar to the prior exam. 10 mm subcapsular hepatic abscess along the periphery of the anterolateral right hepatic lobe. No gallstones, gallbladder wall thickening, or biliary dilatation.  Gallbladder is distended. Pancreas: Unremarkable. No pancreatic ductal dilatation or surrounding inflammatory changes. Spleen: Normal in size without focal abnormality. Adrenals/Urinary Tract: Stable bilateral renal masses unchanged compared with the prior CT with the right adrenal mass measuring 5.2 cm and the 2 left adrenal masses measuring 4.2 and 3.7 cm respectively. Kidneys are normal, without renal calculi, focal lesion, or hydronephrosis. Bladder is unremarkable. Stomach/Bowel: Stomach is within normal limits. No evidence of bowel wall thickening, distention, or inflammatory changes. Appendix is normal. Vascular/Lymphatic: Abdominal aortic atherosclerosis. IVC filter with the tip at the level of the left renal vein. No lymphadenopathy. Reproductive: Prostate is unremarkable. Other: 14 x 5.5 x 20 cm fluid collection in the right side of the abdomen with a surgical drain within the collection most concerning for an abscess. 7 x 1.3 cm complex fluid collection in the pelvis with a pigtail catheter within the collection. 3.3 x 3.7 x 2.1 cm complex fluid collection along the left anterior lower abdomen adjacent to the sigmoid colon consistent with an abscess. 3.7 cm complex fluid collection in the left lateral mid abdomen along the anti mesenteric aspect of the descending colon which may reflect a hematoma versus abscess unchanged compared with 09/29/2022. Musculoskeletal: No acute osseous abnormality. No aggressive osseous lesion. Grade 1 anterolisthesis of L4 on L5 secondary to facet disease. IMPRESSION: 1. A 14 x 5.5 x 20 cm fluid collection in the right side of the abdomen with a surgical drain within the collection most concerning for an abscess. 2. A 7 x 1.3 cm complex fluid collection in the pelvis with a pigtail catheter within the collection decreased in size compared with the prior exam. 3. A 3.3 x 3.7 x 2.1 cm complex fluid collection along the left anterior lower abdomen adjacent to the sigmoid colon  consistent with an abscess. 4. A 3.7 cm complex fluid collection in the left lateral mid abdomen along the anti mesenteric aspect of the descending colon which may reflect a hematoma versus abscess unchanged compared with 09/29/2022. 5. A 4.4 cm subcapsular fluid collection with a small amount air along the peripheral aspect of the right hepatic lobe consistent with an abscess similar to the prior exam. 10 mm subcapsular hepatic abscess along the periphery of the anterolateral right hepatic lobe. 6. Stable bilateral adrenal masses. 7.  Aortic Atherosclerosis (ICD10-I70.0). Electronically Signed   By: Elige Ko M.D.   On: 10/03/2022 11:58   CT ANGIO HEAD NECK W WO CM  Result Date: 10/15/2022 CLINICAL DATA:  Stroke, follow up EXAM: CT ANGIOGRAPHY HEAD AND NECK WITH AND WITHOUT CONTRAST TECHNIQUE: Multidetector CT imaging of the head and neck was performed using the standard protocol during bolus administration of intravenous contrast. Multiplanar CT image reconstructions and MIPs were obtained to evaluate the vascular anatomy. Carotid stenosis measurements (when applicable) are obtained utilizing NASCET criteria, using the distal internal carotid diameter as the denominator. RADIATION DOSE REDUCTION: This exam was performed according to the departmental  dose-optimization program which includes automated exposure control, adjustment of the mA and/or kV according to patient size and/or use of iterative reconstruction technique. CONTRAST:  OMNIPAQUE IOHEXOL 350 MG/ML SOLN COMPARISON:  MR Head 10/08/22 FINDINGS: CT HEAD FINDINGS Brain: Evolving right MCA territory infarct, as seen on recent prior brain MRI. No CT evidence of new infarct. There is trace curvilinear hyperdensity in the anterior right temporal lobe (series 5, image 15), which could represent either petechial or trace subarachnoid hemorrhage. No extra-axial fluid collection. No hydrocephalus. No significant midline shift Vascular: No hyperdense  vessel or unexpected calcification. Skull: Normal. Negative for fracture or focal lesion. Sinuses/Orbits: No acute finding. Other: None. Review of the MIP images confirms the above findings CTA NECK FINDINGS Aortic arch: Standard branching. Imaged portion shows no evidence of aneurysm or dissection. No significant stenosis of the major arch vessel origins. Right carotid system: No evidence of dissection, stenosis (50% or greater), or occlusion. Left carotid system: No evidence of dissection, stenosis (50% or greater), or occlusion. Vertebral arteries: Codominant. No evidence of dissection, stenosis (50% or greater), or occlusion. Skeleton: Negative. Other neck: Negative. Upper chest: Mild paraseptal emphysema. Enteric tube in place. There is circumferential esophageal wall thickening, which can be seen in the setting of esophagitis. Review of the MIP images confirms the above findings CTA HEAD FINDINGS Anterior circulation: No significant stenosis, proximal occlusion, aneurysm, or vascular malformation. Posterior circulation: No significant stenosis, proximal occlusion, aneurysm, or vascular malformation. Venous sinuses: As permitted by contrast timing, patent. Anatomic variants: None Review of the MIP images confirms the above findings IMPRESSION: 1. Evolving right MCA territory infarct, as seen on recent prior brain MRI. No CT evidence of a new infarct. 2. Trace curvilinear hyperdensity in the anterior right temporal lobe, which could represent either petechial or trace subarachnoid hemorrhage. Consider short interval follow up. 3. No intracranial large vessel occlusion or significant stenosis. 4. No hemodynamically significant stenosis in the neck. 5. Circumferential esophageal wall thickening, which can be seen in the setting of esophagitis. Emphysema (ICD10-J43.9). Electronically Signed   By: Lorenza Cambridge M.D.   On: 09/28/2022 11:53   DG CHEST PORT 1 VIEW  Result Date: 10/08/2022 CLINICAL DATA:  Cough  EXAM: PORTABLE CHEST 1 VIEW COMPARISON:  Chest x-ray dated October 02, 2022 FINDINGS: Stable position of right arm PICC right chest wall port. Interval extubation. Enteric tube tip is in the stomach. Cardiac and mediastinal contours within normal limits. Mild elevation of the right hemidiaphragm. Mild bibasilar atelectasis. No evidence of pleural effusion or pneumothorax. IMPRESSION: Interval extubation.  Mild bibasilar atelectasis. Electronically Signed   By: Allegra Lai M.D.   On: 10/04/2022 08:34      Letha Cape 10/02/2022  Patient ID: Justin Cameron, male   DOB: [birth year], 68 y.o.   MRN: [id number]

## 2022-10-28 NOTE — Progress Notes (Signed)
HEMATOLOGY/ONCOLOGY INPATIENT PROGRESS NOTE  Date of Service: 10/09/2022  Inpatient Attending: .Dr Robb Matar   SUBJECTIVE Patient was seen in medical oncology followup last evening and today. Yesterday he was still unsure about whether he wanted to pursue LP and other oncologic treatments and was gradually moving towards comfort cares. Today he was seen with wife and several family members at bedside. He was having significant restlessness consistent with delirium . He had CT abd which showed several remaining abscesses. His clinical condition and mentation has significantly declined and I discussed with his wife that he is rapidly deteriorating. He was now appropriate switched over to comfort cares. Much appreciate Dr Beatriz Stallion help with palliative care and symptom management.  OBJECTIVE:  NAD  PHYSICAL EXAMINATION: Vitals:   10/23/2022 1208 10/05/2022 1230 10/25/2022 1300 10/08/2022 1400  BP:   (!) 78/32 (!) 69/35  Pulse:   (!) 149 (!) 144  Resp:  (!) 21 (!) 22 (!) 34  Temp: (!) 102.3 F (39.1 C)     TempSrc: Axillary     SpO2:  94% 94% (!) 89%  Weight:      Height:       GENERAL: somnolent with some restlessness from delirium. NECK: supple, no JVD LUNGS: clear to auscultation b/l with normal respiratory effort HEART: regular rate & rhythm, mild tachy NEURO: lethargic and restless.    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   ROTATOR CUFF REPAIR      SOCIAL HISTORY: Social History   Socioeconomic History   Marital status: Married    Spouse  name: Not on file   Number of children: Not on file   Years of education: Not on file   Highest education level: Not on file  Occupational History   Not on file  Tobacco Use   Smoking status: Never   Smokeless tobacco: Never  Vaping Use   Vaping Use: Never used  Substance and Sexual Activity   Alcohol use: Not Currently   Drug use: Not Currently   Sexual activity: Not on file  Other Topics Concern   Not on file  Social History Narrative   Not on file   Social Determinants of Health   Financial Resource Strain: Not on file  Food Insecurity: No Food Insecurity (09/23/2022)   Hunger Vital Sign    Worried About Running Out of Food in the Last Year: Never true    Ran Out of Food in the Last Year: Never true  Transportation Needs: No Transportation Needs (09/23/2022)   PRAPARE - Administrator, Civil Service (Medical): No    Lack of Transportation (Non-Medical): No  Physical Activity: Not on file  Stress: Not on file  Social Connections: Not on file  Intimate Partner Violence: Not At Risk (09/23/2022)   Humiliation, Afraid, Rape, and Kick questionnaire    Fear of Current or Ex-Partner: No    Emotionally Abused: No    Physically Abused: No    Sexually Abused: No    FAMILY HISTORY: Family History  Problem Relation Age of Onset   Atrial fibrillation Mother    Alzheimer's disease Mother    Leukemia Father    Diabetes Father    Colon cancer Neg Hx    Rectal cancer Neg Hx    Esophageal cancer Neg Hx    Stomach cancer Neg Hx     ALLERGIES:  has no allergies on file.  MEDICATIONS:  Scheduled Meds:   Continuous Infusions:  sodium chloride Stopped (10/02/22 2010)   sodium chloride Stopped (10/16/2022 1311)   morphine 1 mg/hr (10/05/2022 1600)   PRN Meds:.sodium chloride, sodium chloride, acetaminophen **OR** acetaminophen, antiseptic oral rinse, glycopyrrolate **OR** glycopyrrolate **OR** glycopyrrolate, haloperidol **OR** haloperidol **OR** haloperidol lactate,  LORazepam **OR** LORazepam **OR** LORazepam, morphine, ondansetron **OR** ondansetron (ZOFRAN) IV, polyvinyl alcohol  REVIEW OF SYSTEMS:   .10 Point review of Systems was done is negative except as noted above.  LABORATORY DATA:  I have reviewed the data as listed .    Latest Ref Rng & Units 10/02/2022    8:03 AM 10/19/2022    3:26 AM 10/09/2022    5:47 AM  CBC  WBC 4.0 - 10.5 K/uL 10.1  10.7  8.6   Hemoglobin 13.0 - 17.0 g/dL 40.9  9.6  9.1   Hematocrit 39.0 - 52.0 % 32.6  30.9  29.8   Platelets 150 - 400 K/uL 271  240  235    .    Latest Ref Rng & Units 10/18/2022    3:26 AM 10/10/2022    5:29 AM 10/09/2022    5:47 AM  CMP  Glucose 70 - 99 mg/dL 811  94  92   BUN 8 - 23 mg/dL 21  22  23    Creatinine 0.61 - 1.24 mg/dL 9.14  7.82  9.56   Sodium 135 - 145 mmol/L 131  134  136   Potassium 3.5 - 5.1 mmol/L 4.2  3.5  3.3   Chloride 98 - 111 mmol/L 100  101  105   CO2 22 - 32 mmol/L 21  21  22    Calcium 8.9 - 10.3 mg/dL 7.9  8.0  7.9   Total Protein 6.5 - 8.1 g/dL  5.0    Total Bilirubin 0.3 - 1.2 mg/dL  0.9    Alkaline Phos 38 - 126 U/L  112    AST 15 - 41 U/L  27    ALT 0 - 44 U/L  19       Component     Latest Ref Rng 09/12/2022 09/20/2022 09/21/2022  HCV Ab     NON REACTIVE  NON REACTIVE     Hepatitis B Surface Ag     NON REACTIVE  NON REACTIVE     Hep B Core Total Ab     NON REACTIVE  NON REACTIVE     Procalcitonin     ng/mL 0.22     Cortisol, Plasma     ug/dL 21.3     HIV Screen 4th Generation wRfx     Non Reactive   Non Reactive    Uric Acid, Serum     3.7 - 8.6 mg/dL   5.4     I have personally reviewed the radiological images as listed and agreed with the findings in the report. CT ABDOMEN PELVIS W CONTRAST  Result Date: 10/10/2022 CLINICAL DATA:  Abdominal pain status post surgery EXAM: CT ABDOMEN AND PELVIS WITH CONTRAST TECHNIQUE: Multidetector CT imaging of the abdomen and pelvis was performed using the standard protocol  following bolus administration of  intravenous contrast. RADIATION DOSE REDUCTION: This exam was performed according to the departmental dose-optimization program which includes automated exposure control, adjustment of the mA and/or kV according to patient size and/or use of iterative reconstruction technique. CONTRAST:  OMNIPAQUE IOHEXOL 350 MG/ML SOLN COMPARISON:  09/29/2022 FINDINGS: Lower chest: Trace bilateral pleural effusions. Bibasilar atelectasis. Hepatobiliary: Multiple hypodense fluid attenuating hepatic masses consistent with small cysts. 4.4 cm subcapsular fluid collection with a small amount air along the peripheral aspect of the right hepatic lobe consistent with an abscess similar to the prior exam. 10 mm subcapsular hepatic abscess along the periphery of the anterolateral right hepatic lobe. No gallstones, gallbladder wall thickening, or biliary dilatation. Gallbladder is distended. Pancreas: Unremarkable. No pancreatic ductal dilatation or surrounding inflammatory changes. Spleen: Normal in size without focal abnormality. Adrenals/Urinary Tract: Stable bilateral renal masses unchanged compared with the prior CT with the right adrenal mass measuring 5.2 cm and the 2 left adrenal masses measuring 4.2 and 3.7 cm respectively. Kidneys are normal, without renal calculi, focal lesion, or hydronephrosis. Bladder is unremarkable. Stomach/Bowel: Stomach is within normal limits. No evidence of bowel wall thickening, distention, or inflammatory changes. Appendix is normal. Vascular/Lymphatic: Abdominal aortic atherosclerosis. IVC filter with the tip at the level of the left renal vein. No lymphadenopathy. Reproductive: Prostate is unremarkable. Other: 14 x 5.5 x 20 cm fluid collection in the right side of the abdomen with a surgical drain within the collection most concerning for an abscess. 7 x 1.3 cm complex fluid collection in the pelvis with a pigtail catheter within the collection. 3.3 x 3.7 x 2.1 cm complex fluid collection along  the left anterior lower abdomen adjacent to the sigmoid colon consistent with an abscess. 3.7 cm complex fluid collection in the left lateral mid abdomen along the anti mesenteric aspect of the descending colon which may reflect a hematoma versus abscess unchanged compared with 09/29/2022. Musculoskeletal: No acute osseous abnormality. No aggressive osseous lesion. Grade 1 anterolisthesis of L4 on L5 secondary to facet disease. IMPRESSION: 1. A 14 x 5.5 x 20 cm fluid collection in the right side of the abdomen with a surgical drain within the collection most concerning for an abscess. 2. A 7 x 1.3 cm complex fluid collection in the pelvis with a pigtail catheter within the collection decreased in size compared with the prior exam. 3. A 3.3 x 3.7 x 2.1 cm complex fluid collection along the left anterior lower abdomen adjacent to the sigmoid colon consistent with an abscess. 4. A 3.7 cm complex fluid collection in the left lateral mid abdomen along the anti mesenteric aspect of the descending colon which may reflect a hematoma versus abscess unchanged compared with 09/29/2022. 5. A 4.4 cm subcapsular fluid collection with a small amount air along the peripheral aspect of the right hepatic lobe consistent with an abscess similar to the prior exam. 10 mm subcapsular hepatic abscess along the periphery of the anterolateral right hepatic lobe. 6. Stable bilateral adrenal masses. 7.  Aortic Atherosclerosis (ICD10-I70.0). Electronically Signed   By: Elige Ko M.D.   On: 10/18/2022 11:58   CT ANGIO HEAD NECK W WO CM  Result Date: 10/21/2022 CLINICAL DATA:  Stroke, follow up EXAM: CT ANGIOGRAPHY HEAD AND NECK WITH AND WITHOUT CONTRAST TECHNIQUE: Multidetector CT imaging of the head and neck was performed using the standard protocol during bolus administration of intravenous contrast. Multiplanar CT image reconstructions and MIPs were obtained to evaluate the vascular anatomy. Carotid stenosis measurements (  when  applicable) are obtained utilizing NASCET criteria, using the distal internal carotid diameter as the denominator. RADIATION DOSE REDUCTION: This exam was performed according to the departmental dose-optimization program which includes automated exposure control, adjustment of the mA and/or kV according to patient size and/or use of iterative reconstruction technique. CONTRAST:  OMNIPAQUE IOHEXOL 350 MG/ML SOLN COMPARISON:  MR Head 10/08/22 FINDINGS: CT HEAD FINDINGS Brain: Evolving right MCA territory infarct, as seen on recent prior brain MRI. No CT evidence of new infarct. There is trace curvilinear hyperdensity in the anterior right temporal lobe (series 5, image 15), which could represent either petechial or trace subarachnoid hemorrhage. No extra-axial fluid collection. No hydrocephalus. No significant midline shift Vascular: No hyperdense vessel or unexpected calcification. Skull: Normal. Negative for fracture or focal lesion. Sinuses/Orbits: No acute finding. Other: None. Review of the MIP images confirms the above findings CTA NECK FINDINGS Aortic arch: Standard branching. Imaged portion shows no evidence of aneurysm or dissection. No significant stenosis of the major arch vessel origins. Right carotid system: No evidence of dissection, stenosis (50% or greater), or occlusion. Left carotid system: No evidence of dissection, stenosis (50% or greater), or occlusion. Vertebral arteries: Codominant. No evidence of dissection, stenosis (50% or greater), or occlusion. Skeleton: Negative. Other neck: Negative. Upper chest: Mild paraseptal emphysema. Enteric tube in place. There is circumferential esophageal wall thickening, which can be seen in the setting of esophagitis. Review of the MIP images confirms the above findings CTA HEAD FINDINGS Anterior circulation: No significant stenosis, proximal occlusion, aneurysm, or vascular malformation. Posterior circulation: No significant stenosis, proximal occlusion,  aneurysm, or vascular malformation. Venous sinuses: As permitted by contrast timing, patent. Anatomic variants: None Review of the MIP images confirms the above findings IMPRESSION: 1. Evolving right MCA territory infarct, as seen on recent prior brain MRI. No CT evidence of a new infarct. 2. Trace curvilinear hyperdensity in the anterior right temporal lobe, which could represent either petechial or trace subarachnoid hemorrhage. Consider short interval follow up. 3. No intracranial large vessel occlusion or significant stenosis. 4. No hemodynamically significant stenosis in the neck. 5. Circumferential esophageal wall thickening, which can be seen in the setting of esophagitis. Emphysema (ICD10-J43.9). Electronically Signed   By: Lorenza Cambridge M.D.   On: 10/25/2022 11:53   DG CHEST PORT 1 VIEW  Result Date: 10/09/2022 CLINICAL DATA:  Cough EXAM: PORTABLE CHEST 1 VIEW COMPARISON:  Chest x-ray dated October 02, 2022 FINDINGS: Stable position of right arm PICC right chest wall port. Interval extubation. Enteric tube tip is in the stomach. Cardiac and mediastinal contours within normal limits. Mild elevation of the right hemidiaphragm. Mild bibasilar atelectasis. No evidence of pleural effusion or pneumothorax. IMPRESSION: Interval extubation.  Mild bibasilar atelectasis. Electronically Signed   By: Allegra Lai M.D.   On: 10/12/2022 08:34   MR BRAIN W WO CONTRAST  Result Date: 10/08/2022 CLINICAL DATA:  Concern for CNS involvement from high-grade lymphoma EXAM: MRI HEAD WITHOUT AND WITH CONTRAST TECHNIQUE: Multiplanar, multiecho pulse sequences of the brain and surrounding structures were obtained without and with intravenous contrast. CONTRAST:  9mL GADAVIST GADOBUTROL 1 MMOL/ML IV SOLN COMPARISON:  No prior MRI available, correlation is made with CT head 610 2024 FINDINGS: Brain: Gyral enhancement in a sharply demarcated, contiguous area involving the right temporal lobe, occipital lobe, and inferior  parietal lobe. These areas are associated with increased T2 hyperintense signal and gyral swelling, as well as hemosiderin deposition, likely petechial hemorrhage. Some areas of cortex to have  increased signal on diffusion-weighted imaging with ADC correlates (for example series 5, image 19 and series 6, image 19), which may be related to susceptibility. Mild mass effect on the right occipital and temporal horns of the lateral ventricle, but no significant midline shift. No other restricted diffusion to suggest acute or subacute infarct. No other parenchymal hemorrhage or midline shift. No hydrocephalus or extra-axial collection. Vascular: Normal arterial flow voids. Normal arterial and venous enhancement. Skull and upper cervical spine: Normal marrow signal. Sinuses/Orbits: Clear paranasal sinuses. No acute finding in the orbits. Other: The mastoid air cells are well aerated. IMPRESSION: Gyral enhancement in a sharply demarcated, contiguous area involving the right temporal lobe, occipital lobe, and inferior parietal lobe, with associated gyral swelling and hemosiderin deposition, likely petechial hemorrhage. This appears localized to the posterior MCA territory, and is favored to represent a subacute infarct, although lymphomatous involvement could appear similar. These results will be called to the ordering clinician or representative by the Radiologist Assistant, and communication documented in the PACS or [employer]. Electronically Signed   By: Wiliam Ke M.D.   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 EXAM: CT HEAD WITHOUT AND WITH CONTRAST TECHNIQUE: Contiguous axial images were obtained from the base of the skull through the vertex without and with intravenous contrast. RADIATION DOSE REDUCTION: This exam was performed according to the departmental dose-optimization program which  includes automated exposure control, adjustment of the mA and/or kV according to patient size and/or use of iterative reconstruction technique. CONTRAST:  75mL OMNIPAQUE IOHEXOL 300 MG/ML  SOLN COMPARISON:  None Available. FINDINGS: Brain: Leptomeningeal and gyral enhancement along the posterior right temporal lobe, extending into the inferior parietal and occipital lobes with subjacent hypoattenuation of the gray and white matter, suspicious for CNS involvement of lymphoma. No significant mass effect or midline shift. No acute hemorrhage. No hydrocephalus or extra-axial collection. Vascular: No hyperdense vessel or unexpected calcification. Visible vessels are patent. Skull: No calvarial fracture or suspicious bone lesion. Skull base is unremarkable. Sinuses/Orbits: Unremarkable. Other: None. IMPRESSION: Leptomeningeal and gyral enhancement along the posterior right temporal lobe, extending into the inferior right parietal and occipital lobes with subjacent hypoattenuation of the gray and white matter, suspicious for CNS involvement of lymphoma. No significant mass effect or midline shift. Electronically Signed   By: Orvan Falconer M.D.   On: 10/07/2022 12:57   DG Abd 1 View  Result Date: 10/06/2022 CLINICAL DATA:  Inpatient, abdominal pain, small-bowel obstruction EXAM: ABDOMEN - 1 VIEW COMPARISON:  10/03/2022 abdominal radiograph FINDINGS: IVC filter again noted in the medial right abdomen. Transgluteal right pelvic pigtail catheter overlies the midline lower sacrum. Surgical drain overlies the right abdomen. Right lower quadrant ostomy. No disproportionately dilated small bowel loops. Decreased mild to moderate stool in the remnant colon. No evidence of pneumatosis or pneumoperitoneum on this supine view. Marked lumbar spondylosis. IMPRESSION: Nonobstructive bowel gas pattern. Decreased mild to moderate stool in the remnant colon. Electronically Signed   By: Delbert Phenix M.D.   On: 10/06/2022 16:13   DG  Abd Portable 1V  Result Date: 10/03/2022 CLINICAL DATA:  NG tube placement. EXAM: PORTABLE ABDOMEN - 1 VIEW COMPARISON:  09/16/2022. FINDINGS: Nasogastric tube passes below the diaphragm, tip in the mid stomach. Normal bowel gas pattern. IMPRESSION: Well-positioned nasal/orogastric tube. Electronically Signed   By: Amie Portland M.D.   On: 10/03/2022 12:56   DG  Chest Port 1 View  Result Date: 10/02/2022 CLINICAL DATA:  Dyspnea. EXAM: PORTABLE CHEST 1 VIEW COMPARISON:  September 30, 2022. FINDINGS: Stable cardiomediastinal silhouette. Endotracheal and nasogastric tubes are unchanged in position. Right internal jugular Port-A-Cath is unchanged. Right-sided PICC line is unchanged. Minimal bibasilar subsegmental atelectasis is noted. Bony thorax is unremarkable. IMPRESSION: Stable support apparatus. Minimal bibasilar subsegmental atelectasis. Electronically Signed   By: Lupita Raider M.D.   On: 10/02/2022 11:22   DG CHEST PORT 1 VIEW  Result Date: 09/30/2022 CLINICAL DATA:  OG tube and endotracheal tube placement. EXAM: PORTABLE CHEST 1 VIEW COMPARISON:  08/28/2022 FINDINGS: An endotracheal tube with tip 2.5 cm above the carina, RIGHT PICC line with tip difficult to visualize but appears to overlie the RIGHT atrium and a RIGHT Port-A-Cath with tip overlying the RIGHT atrium as well. An enteric tube is noted with tip overlying the mid stomach. Bibasilar opacities/atelectasis noted. There is no evidence of pneumothorax or large pleural effusion. IMPRESSION: 1. RIGHT PICC line placement but otherwise unchanged appearance of the chest. Other support apparatus as described with mild bibasilar opacities/atelectasis. Electronically Signed   By: Harmon Pier M.D.   On: 09/30/2022 17:26   CT GUIDED PERITONEAL/RETROPERITONEAL FLUID DRAIN BY PERC CATH  Result Date: 09/30/2022 INDICATION: [id number] Postprocedural intraabdominal abscess [id number] EXAM: CT-guided pelvic drain placement TECHNIQUE: Multidetector CT imaging of the  abdomen and pelvis was performed following the standard protocol without IV contrast. RADIATION DOSE REDUCTION: This exam was performed according to the departmental dose-optimization program which includes automated exposure control, adjustment of the mA and/or kV according to patient size and/or use of iterative reconstruction technique. MEDICATIONS: The patient is currently admitted to the hospital and receiving intravenous antibiotics. The antibiotics were administered within an appropriate time frame prior to the initiation of the procedure. ANESTHESIA/SEDATION: Moderate (conscious) sedation was employed during this procedure. A total of Versed 2 mg and Fentanyl 100 mcg was administered intravenously by the radiology nurse. Total intra-service moderate Sedation Time: 19 minutes. The patient's level of consciousness and vital signs were monitored continuously by radiology nursing throughout the procedure under my direct supervision. COMPLICATIONS: None immediate. PROCEDURE: Informed written consent was obtained from the patient after a thorough discussion of the procedural risks, benefits and alternatives. All questions were addressed. Maximal Sterile Barrier Technique was utilized including caps, mask, sterile gowns, sterile gloves, sterile drape, hand hygiene and skin antiseptic. A timeout was performed prior to the initiation of the procedure. The patient was placed supine on the exam table. Limited CT of the abdomen and pelvis was performed for planning purposes. This demonstrated significant improvement of gas and fluid collection in the right lower quadrant. The surgically placed drainage catheter courses through this collection. Also noted was unchanged appearance a sizable posterior pelvic collection. Given these findings, the decision was made to proceed with placement of a transgluteal pelvic drainage catheter. The patient was repositioned in the lateral decubitus position. Skin entry site was marked,  and the overlying skin was prepped and draped in the standard sterile fashion. Local analgesia was obtained with 1% lidocaine. Using intermittent CT fluoroscopy, a 19 gauge Yueh catheter was advanced towards the identified posterior pelvic fluid collection using a right transgluteal approach. Location was confirmed with CT and return of purulent material. Over an Amplatz wire, the percutaneous tract was serially dilated to accommodate a 12 French locking drainage catheter. Location was again confirmed with CT and return of additional fluid. Locking loop was formed, the drainage catheter was secured  to the skin using silk suture and a dressing. It was attached to bulb suction. The patient tolerated the procedure well without immediate complication. IMPRESSION: 1. CT imaging demonstrated significant improvement in the right lower quadrant collection with surgical drainage catheter in place. Given these findings, the decision was made to proceed with drainage of the unchanged posterior pelvic collection. 2. Successful CT-guided placement of a 12 French locking drainage catheter into the posterior pelvic collection via a right transgluteal approach. Drainage catheter placed to bulb suction. Electronically Signed   By: Olive Bass M.D.   On: 09/30/2022 16:21   CT ABDOMEN PELVIS W CONTRAST  Result Date: 09/29/2022 CLINICAL DATA:  Abdominal pain.  Postop. EXAM: CT ABDOMEN AND PELVIS WITH CONTRAST TECHNIQUE: Multidetector CT imaging of the abdomen and pelvis was performed using the standard protocol following bolus administration of intravenous contrast. RADIATION DOSE REDUCTION: This exam was performed according to the departmental dose-optimization program which includes automated exposure control, adjustment of the mA and/or kV according to patient size and/or use of iterative reconstruction technique. CONTRAST:  80mL OMNIPAQUE IOHEXOL 300 MG/ML  SOLN COMPARISON:  09/18/2022. FINDINGS: Lower chest: Small right and  trace left pleural effusions. Associated dependent lower lobe atelectasis, also greater on the right. Small focus of atelectasis at the base of the left upper lobe lingula. Hepatobiliary: Liver normal in size and attenuation. Multiple small low-attenuation liver masses consistent with cysts stable from the prior CT. Normal gallbladder. No bile duct dilation. Pancreas: Unremarkable. Spleen: Normal. Adrenals/Urinary Tract: Bilateral adrenal masses, unchanged from the recent prior CT, right 5.1 cm, 2 on the left 3.6 and 4.2 cm. Normal kidneys, ureters and bladder. Stomach/Bowel: Since the prior CT, abdominal surgery has been performed. There is a midline incision with the skin and subcutaneous fat open, fascia closed. Colon has been ligated at the level of the right mid transverse section. The ileum extends into a right anterior mid abdomen ostomy. There are postoperative collections. The largest is a heterogeneous right abdominal collection in the area of the necrotic mass. Soft tissue attenuation along some of the margins of this collection may reflect necrotic tissue or decompressed small bowel or a combination. The collection contains fluid and air. It measures approximately 23 cm from superior to inferior by 16 x 10 cm transversely. A drainage catheter lies within the mid to upper portion of this collection. Small subcapsular collection lies along the right liver margin. A subcapsular component of the larger collection lies along the anterior inferior aspect of the right liver lobe. Fluid is also seen distending the posterior pelvic recess. Remaining colon shows a generalized mild increase in the colonic stool burden, but no wall thickening or convincing inflammation. There is no small bowel dilation to suggest obstruction. Stomach is moderately distended, but without wall thickening. Tiny bubbles of free air lie just deep to the upper abdominal incision line. Vascular/Lymphatic: Well-positioned vena cava filter.  Aortic atherosclerosis. Prominent gastrohepatic ligament lymph nodes. Mesenteric nodes noted on the prior CT are less well-defined due to postoperative fluid/edema. Reproductive: Unremarkable. Other: None. Musculoskeletal: No fracture or acute finding. No osteoblastic or osteolytic lesions. IMPRESSION: 1. Status post abdominal surgery since the previous CT scan. Large irregular fluid collection in the right abdomen lies in the location of the previous seen large necrotic mass. Some of this collection may reflect the residual mass. Collection extends from the subcapsular inferior right liver to the right pelvis measuring 23 x 10 x 16 cm. Surgical drain lies within the upper to mid  aspect of this collection. 2. 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 No evidence of bowel obstruction. 3. Findings of metastatic disease noted on the recent prior CT are unchanged. Electronically Signed   By: Amie Portland M.D.   On: 09/29/2022 16:02   VAS [place] LOWER EXTREMITY VENOUS (DVT)  Result Date: 09/25/2022  Lower Venous DVT Study Patient Name:  ZEVON YOUMANS  Date of Exam:   09/25/2022 Medical Rec #: [id number]            Accession #:    [id number] Date of Birth: Jun 02, 1955             Patient Gender: M Patient Age:   70 years Exam Location:  [facility] Procedure:      VAS [place] LOWER EXTREMITY VENOUS (DVT) Referring Phys: Hosie Spangle --------------------------------------------------------------------------------  Indications: Edema.  Risk Factors: Chemotherapy Cancer Lymphoma. Comparison Study: No previous exams Performing Technologist: Jody Hill RVT, RDMS  Examination Guidelines: A complete evaluation includes B-mode imaging, spectral Doppler, color Doppler, and power Doppler as needed of all accessible portions of each vessel. Bilateral testing is considered an integral part of a complete examination. Limited examinations for reoccurring indications may be  performed as noted. The reflux portion of the exam is performed with the patient in reverse Trendelenburg.  +---------+---------------+---------+-----------+----------+--------------+ RIGHT    CompressibilityPhasicitySpontaneityPropertiesThrombus Aging +---------+---------------+---------+-----------+----------+--------------+ CFV      Full           Yes      Yes                                 +---------+---------------+---------+-----------+----------+--------------+ SFJ      Full                                                        +---------+---------------+---------+-----------+----------+--------------+ FV Prox  Full           Yes      Yes                                 +---------+---------------+---------+-----------+----------+--------------+ FV Mid   Full           Yes      Yes                                 +---------+---------------+---------+-----------+----------+--------------+ FV DistalFull           Yes      Yes                                 +---------+---------------+---------+-----------+----------+--------------+ PFV      Full                                                        +---------+---------------+---------+-----------+----------+--------------+ POP      Full  Yes      Yes                                 +---------+---------------+---------+-----------+----------+--------------+ PTV      None           No       No                   Acute          +---------+---------------+---------+-----------+----------+--------------+ PERO     None           No       No                   Acute          +---------+---------------+---------+-----------+----------+--------------+   +---------+---------------+---------+-----------+----------+--------------+ LEFT     CompressibilityPhasicitySpontaneityPropertiesThrombus Aging +---------+---------------+---------+-----------+----------+--------------+ CFV      Full            Yes      Yes                                 +---------+---------------+---------+-----------+----------+--------------+ SFJ      Full                                                        +---------+---------------+---------+-----------+----------+--------------+ FV Prox  Full           Yes      Yes                                 +---------+---------------+---------+-----------+----------+--------------+ FV Mid   Full           Yes      Yes                                 +---------+---------------+---------+-----------+----------+--------------+ FV DistalFull           Yes      Yes                                 +---------+---------------+---------+-----------+----------+--------------+ PFV      Full                                                        +---------+---------------+---------+-----------+----------+--------------+ POP      Full           Yes      Yes                                 +---------+---------------+---------+-----------+----------+--------------+ PTV      Full                                                        +---------+---------------+---------+-----------+----------+--------------+  PERO     None           No       No                   Acute          +---------+---------------+---------+-----------+----------+--------------+     Summary: BILATERAL: -No evidence of popliteal cyst, bilaterally. RIGHT: - Findings consistent with acute deep vein thrombosis involving the right peroneal veins, and right posterior tibial veins.  LEFT: - Findings consistent with acute deep vein thrombosis involving the left peroneal veins.  *See table(s) above for measurements and observations. Electronically signed by Coral Else MD on 09/25/2022 at 5:07:37 PM.    Final    IR IVC FILTER PLMT / S&I Lenise Arena GUID/MOD SED  Result Date: 09/25/2022 CLINICAL DATA:  67 year old male with history of diffuse large B-cell lymphoma and lower  extremity deep vein thrombosis with contraindication to anticoagulation due to high risk of lymphoma hemorrhagic transformation. EXAM: 1. ULTRASOUND GUIDANCE FOR VASCULAR ACCESS OF THE RIGHT internal jugular VEIN. 2. IVC VENOGRAM. 3. PERCUTANEOUS IVC FILTER PLACEMENT. ANESTHESIA/SEDATION: Moderate (conscious) sedation was employed during this procedure. A total of Versed 1 mg and Fentanyl 50 mcg was administered intravenously. Moderate Sedation Time: 12 minutes. The patient's level of consciousness and vital signs were monitored continuously by radiology nursing throughout the procedure under my direct supervision. CONTRAST:  30mL OMNIPAQUE IOHEXOL 300 MG/ML SOLN, 7mL OMNIPAQUE IOHEXOL 300 MG/ML SOLN FLUOROSCOPY TIME:  One hundred five mGy. PROCEDURE: The procedure, risks, benefits, and alternatives were explained to the patient. Questions regarding the procedure were encouraged and answered. The patient understands and consents to the procedure. The patient was prepped with Betadine in a sterile fashion, and a sterile drape was applied covering the operative field. A sterile gown and sterile gloves were used for the procedure. Local anesthesia was provided with 1% Lidocaine. Under direct ultrasound guidance, a 21 gauge needle was advanced into the right internal jugular vein with ultrasound image documentation performed. After securing access with a micropuncture dilator, a guidewire was advanced into the inferior vena cava. A deployment sheath was advanced over the guidewire. This was utilized to perform IVC venography. The deployment sheath was further positioned in an appropriate location for filter deployment. A Denali IVC filter was then advanced in the sheath. This was then fully deployed in the infrarenal IVC. Final filter position was confirmed with a fluoroscopic spot image. Contrast injection was also performed through the sheath under fluoroscopy to confirm patency of the IVC at the level of the filter.  After the procedure the sheath was removed and hemostasis obtained with manual compression. COMPLICATIONS: None. FINDINGS: IVC venography demonstrates a normal caliber IVC with no evidence of thrombus. Renal veins are identified bilaterally. The IVC filter was successfully positioned below the level of the renal veins and is appropriately oriented. This IVC filter has both permanent and retrievable indications. IMPRESSION: Placement of percutaneous IVC filter in infrarenal IVC. IVC venogram shows no evidence of IVC thrombus and normal caliber of the inferior vena cava. This filter does have both permanent and retrievable indications. PLAN: This IVC filter is potentially retrievable. The patient will be assessed for filter retrieval by Interventional Radiology in approximately 8-12 weeks. Further recommendations regarding filter retrieval, continued surveillance or declaration of device permanence, will be made at that time. Marliss Coots, MD [facility] Radiology Electronically Signed   By: Marliss Coots M.D.   On: 09/25/2022 15:20  ECHOCARDIOGRAM LIMITED  Result Date: 09/25/2022    ECHOCARDIOGRAM LIMITED REPORT   Patient Name:   MARIS HASTON Date of Exam: 09/25/2022 Medical Rec #:  [id number]           Height:       68.0 in Accession #:    [id number]          Weight:       178.8 lb Date of Birth:  [birth year]            BSA:          1.949 m Patient Age:    66 years            BP:           94/56 mmHg Patient Gender: M                   HR:           100 bpm. Exam Location:  Inpatient Procedure: Limited Echo, Cardiac Doppler, Color Doppler and Intracardiac            Opacification Agent Indications:    Pulmonary embolis  History:        Patient has prior history of Echocardiogram examinations, most                 recent 09/20/2022.  Sonographer:    Lucy Antigua Referring Phys: [id number] Adam Phenix IMPRESSIONS  1. Left ventricular ejection fraction, by  estimation, is 45 to 50%. The left ventricle has mildly decreased function. There is the interventricular septum is flattened in systole, consistent with right ventricular pressure overload.  2. Right ventricular systolic function is mildly reduced. The right ventricular size is mildly enlarged.  3. Mild mitral valve regurgitation.  4. The aortic valve is tricuspid. Aortic valve sclerosis is present, with no evidence of aortic valve stenosis. Comparison(s): Prior images reviewed side by side. Compared to recent prior study, LVEF is mildly decreased, with evidence of new RV strain. This study uses echo contrast. FINDINGS  Left Ventricle: Left ventricular ejection fraction, by estimation, is 45 to 50%. The left ventricle has mildly decreased function. Definity contrast agent was given IV to delineate the left ventricular endocardial borders. The interventricular septum is  flattened in systole, consistent with right ventricular pressure overload. Right Ventricle: The right ventricular size is mildly enlarged. Right ventricular systolic function is mildly reduced. Mitral Valve: Mild mitral valve regurgitation. Tricuspid Valve: Tricuspid valve regurgitation is mild. Aortic Valve: The aortic valve is tricuspid. Aortic valve sclerosis is present, with no evidence of aortic valve stenosis. Pulmonic Valve: The pulmonic valve was normal in structure. Pulmonic valve regurgitation is trivial. No evidence of pulmonic stenosis. Additional Comments: Spectral Doppler performed. Color Doppler performed.   LV Volumes (MOD) LV vol d, MOD A2C: 77.5 ml LV vol d, MOD A4C: 70.7 ml LV vol s, MOD A2C: 44.8 ml LV vol s, MOD A4C: 37.7 ml LV SV MOD A2C:     32.7 ml LV SV MOD A4C:     70.7 ml LV SV MOD BP:      35.1 ml Riley Lam MD Electronically signed by Riley Lam MD Signature Date/Time: 09/25/2022/1:37:02 PM    Final    [place] EKG SITE RITE  Result Date: 09/25/2022 If Site Rite image not attached, placement could not be  confirmed due to current cardiac rhythm.  DG Chest 1 View  Result Date: 09/07/2022 CLINICAL DATA:  Check endotracheal tube placement EXAM: PORTABLE  CHEST 1 VIEW COMPARISON:  Film from earlier in the same day. FINDINGS: Gastric catheter is noted within the stomach. Endotracheal tube is noted in satisfactory position. Right chest wall port is seen and stable. Lungs are well aerated bilaterally. Minimal right effusion is seen. No acute bony abnormality is noted. IMPRESSION: Minimal right-sided effusion. Electronically Signed   By: Alcide Clever M.D.   On: 08/28/2022 20:25   DG CHEST PORT 1 VIEW  Result Date: 09/25/2022 CLINICAL DATA:  Evaluate gastric catheter placement EXAM: PORTABLE CHEST 1 VIEW COMPARISON:  09/18/2022 FINDINGS: Cardiac shadow is stable but accentuated by the portable technique. The overall inspiratory effort is poor with right basilar atelectasis. Right chest wall port is seen in satisfactory position. No pneumothorax is noted. Gastric catheter is noted with the tip superimposed over the left mainstem bronchus. This may still lie within the mid esophagus. This should be withdrawn and readvanced. IMPRESSION: Gastric catheter as described. This should be withdrawn and readvanced into the stomach. Electronically Signed   By: Alcide Clever M.D.   On: 08/28/2022 15:09   DG Abd Portable 1V  Result Date: 09/27/2022 CLINICAL DATA:  Check gastric catheter placement EXAM: PORTABLE ABDOMEN - 1 VIEW COMPARISON:  Film from earlier in the same day. FINDINGS: Scattered large and small bowel gas is noted. Retained fecal material within the colon is seen. No free air is noted. Gastric catheter is not visualized. Chest x-ray may be helpful for further evaluation. IMPRESSION: Gastric catheter is not visualized on this exam. Electronically Signed   By: Alcide Clever M.D.   On: 09/11/2022 15:07   DG Abd 1 View  Result Date: 09/10/2022 CLINICAL DATA:  Nasogastric placement EXAM: ABDOMEN - 1 VIEW COMPARISON:   Earlier same day FINDINGS: Nasogastric tube enters the stomach and coils in the fundus. Less intestinal gas than was noted on the prior image. Lucency in the right upper quadrant was not seen on the previous images. Free intraperitoneal air is not excluded. If there is clinical concern regarding this possibility, either left-side-down decubitus imaging or CT abdomen would be suggested. IMPRESSION: 1. Nasogastric tube enters the stomach and coils in the fundus. 2. Less intestinal gas than was noted on the prior image. 3. Lucency in the right upper quadrant was not seen on the previous images. Free intraperitoneal air is not excluded. See above discussion. Electronically Signed   By: Paulina Fusi M.D.   On: 09/13/2022 12:14   DG Abd Portable 1V  Result Date: 09/12/2022 CLINICAL DATA:  Abdominal pain. EXAM: PORTABLE ABDOMEN - 1 VIEW COMPARISON:  None Available. FINDINGS: Mildly dilated air-filled small bowel loops are noted concerning for ileus or possibly distal small bowel obstruction. No colonic dilatation is noted. No abnormal calcifications are noted. IMPRESSION: Mildly dilated air-filled small bowel loops are noted concerning for ileus or possibly distal small bowel obstruction. Electronically Signed   By: Lupita Raider M.D.   On: 09/01/2022 09:05   IR IMAGING GUIDED PORT INSERTION  Result Date: 09/20/2022 INDICATION: Colonic mass.  New diagnosis of lymphoma. EXAM: IMPLANTED PORT A CATH PLACEMENT WITH ULTRASOUND AND FLUOROSCOPIC GUIDANCE MEDICATIONS: Ancef 2 gm IV; The antibiotic was administered within an appropriate time interval prior to skin puncture. ANESTHESIA/SEDATION: Moderate (conscious) sedation was employed during this procedure. A total of Versed 2 mg and Fentanyl 100 mcg was administered intravenously. Moderate Sedation Time: 20 minutes. The patient's level of consciousness and vital signs were monitored continuously by radiology nursing throughout the procedure under my direct supervision.  FLUOROSCOPY TIME:  Fluoroscopic dose; 0 mGy COMPLICATIONS: None immediate. PROCEDURE: The procedure, risks, benefits, and alternatives were explained to the patient. Questions regarding the procedure were encouraged and answered. The patient understands and consents to the procedure. The RIGHT neck and chest were prepped with chlorhexidine in a sterile fashion, and a sterile drape was applied covering the operative field. Maximum barrier sterile technique with sterile gowns and gloves were used for the procedure. A timeout was performed prior to the initiation of the procedure. Local anesthesia was provided with 1% lidocaine with epinephrine. After creating a small venotomy incision, a micropuncture kit was utilized to access the internal jugular vein under direct, real-time ultrasound guidance. Ultrasound image documentation was performed. The microwire was kinked to measure appropriate catheter length. A subcutaneous port pocket was then created along the upper chest wall utilizing a combination of sharp and blunt dissection. The pocket was irrigated with sterile saline. A single lumen Non-ISP power injectable port was chosen for placement. The 8 Fr catheter was tunneled from the port pocket site to the venotomy incision. The port was placed in the pocket. The external catheter was trimmed to appropriate length. At the venotomy, an 8 Fr peel-away sheath was placed over a guidewire under fluoroscopic guidance. The catheter was then placed through the sheath and the sheath was removed. Final catheter positioning was confirmed and documented with a fluoroscopic spot radiograph. The port was accessed with a Huber needle, aspirated and flushed with heparinized saline. The port pocket incision was closed with interrupted 3-0 Vicryl suture then Dermabond was applied, including at the venotomy incision. Dressings were placed. The patient tolerated the procedure well without immediate post procedural complication.  IMPRESSION: Successful placement of a RIGHT internal jugular approach power injectable Port-A-Cath. The tip of the catheter is positioned within the proximal RIGHT atrium. The catheter is ready for immediate use. Roanna Banning, MD [facility] Radiology Electronically Signed   By: Roanna Banning M.D.   On: 09/20/2022 14:44   ECHOCARDIOGRAM COMPLETE  Result Date: 09/20/2022    ECHOCARDIOGRAM REPORT   Patient Name:   DASANI SPEICHER Date of Exam: 09/20/2022 Medical Rec #:  [id number]           Height:       68.0 in Accession #:    [id number]          Weight:       177.9 lb Date of Birth:  05/11/1955            BSA:          1.945 m Patient Age:    66 years            BP:           100/63 mmHg Patient Gender: M                   HR:           80 bpm. Exam Location:  Inpatient Procedure: 2D Echo, Cardiac Doppler and Color Doppler Indications:    Chemo  History:        Patient has no prior history of Echocardiogram examinations.  Sonographer:    Lucy Antigua Referring Phys: [id number] [name]  Sonographer Comments: Strain was attempted. Echo machine @ [facility] doesn't have strain Neurosurgeon. IMPRESSIONS  1. Left ventricular ejection fraction, by estimation, is 55%. The left ventricle has low normal function. The left ventricle has no regional wall motion abnormalities.  2. Right ventricular systolic function is normal. The right ventricular size is normal. There is normal pulmonary artery systolic pressure.  3. The mitral valve is normal in structure. Trivial mitral valve regurgitation. No evidence of mitral stenosis.  4. The aortic valve is normal in structure. Aortic valve regurgitation is not visualized. No aortic stenosis is present.  5. The inferior vena cava is normal in size with greater than 50% respiratory variability, suggesting right atrial pressure of 3 mmHg. FINDINGS  Left Ventricle: Left ventricular ejection fraction, by estimation, is 50 to 55%. The left  ventricle has low normal function. The left ventricle has no regional wall motion abnormalities. The left ventricular internal cavity size was normal in size. There is borderline left ventricular hypertrophy. Left ventricular diastolic parameters were normal. Right Ventricle: The right ventricular size is normal. No increase in right ventricular wall thickness. Right ventricular systolic function is normal. There is normal pulmonary artery systolic pressure. The tricuspid regurgitant velocity is 1.94 m/s, and  with an assumed right atrial pressure of 3 mmHg, the estimated right ventricular systolic pressure is 18.1 mmHg. Left Atrium: Left atrial size was normal in size. Right Atrium: Right atrial size was normal in size. Pericardium: There is no evidence of pericardial effusion. Mitral Valve: The mitral valve is normal in structure. Trivial mitral valve regurgitation. No evidence of mitral valve stenosis. Tricuspid Valve: The tricuspid valve is normal in structure. Tricuspid valve regurgitation is trivial. No evidence of tricuspid stenosis. Aortic Valve: The aortic valve is normal in structure. Aortic valve regurgitation is not visualized. No aortic stenosis is present. Aortic valve mean gradient measures 2.0 mmHg. Aortic valve peak gradient measures 3.6 mmHg. Aortic valve area, by VTI measures 3.80 cm. Pulmonic Valve: The pulmonic valve was normal in structure. Pulmonic valve regurgitation is not visualized. No evidence of pulmonic stenosis. Aorta: The aortic root is normal in size and structure. Venous: The inferior vena cava is normal in size with greater than 50% respiratory variability, suggesting right atrial pressure of 3 mmHg. IAS/Shunts: No atrial level shunt detected by color flow Doppler.  LEFT VENTRICLE PLAX 2D LVIDd:         3.60 cm   Diastology LVIDs:         2.70 cm   LV e' medial:    10.00 cm/s LV PW:         1.20 cm   LV E/e' medial:  4.6 LV IVS:        1.00 cm   LV e' lateral:   9.36 cm/s LVOT  diam:     2.20 cm   LV E/e' lateral: 4.9 LV SV:         74 LV SV Index:   38 LVOT Area:     3.80 cm  RIGHT VENTRICLE RV S prime:     10.90 cm/s TAPSE (M-mode): 1.5 cm LEFT ATRIUM             Index        RIGHT ATRIUM           Index LA Vol (A2C):   61.4 ml 31.58 ml/m  RA Area:     13.90 cm LA Vol (A4C):   58.4 ml 30.03 ml/m  RA Volume:   32.80 ml  16.87 ml/m LA Biplane Vol: 59.6 ml 30.65 ml/m  AORTIC VALVE AV Area (Vmax):    3.46 cm AV Area (Vmean):   3.17 cm AV Area (VTI):     3.80 cm AV Vmax:  94.70 cm/s AV Vmean:          64.800 cm/s AV VTI:            0.195 m AV Peak Grad:      3.6 mmHg AV Mean Grad:      2.0 mmHg LVOT Vmax:         86.20 cm/s LVOT Vmean:        54.000 cm/s LVOT VTI:          0.195 m LVOT/AV VTI ratio: 1.00  AORTA Ao Root diam: 2.90 cm Ao Asc diam:  3.60 cm MITRAL VALVE               TRICUSPID VALVE MV Area (PHT): 3.08 cm    TR Peak grad:   15.1 mmHg MV Decel Time: 246 msec    TR Vmax:        194.00 cm/s MR Peak grad: 32.5 mmHg MR Vmax:      285.00 cm/s  SHUNTS MV E velocity: 46.30 cm/s  Systemic VTI:  0.20 m MV A velocity: 61.30 cm/s  Systemic Diam: 2.20 cm MV E/A ratio:  0.76 Aditya Sabharwal Electronically signed by Dorthula Nettles Signature Date/Time: 09/20/2022/1:15:18 PM    Final    CT ABDOMEN PELVIS W CONTRAST  Result Date: 09/18/2022 CLINICAL DATA:  Colon cancer, anorexia, weakness, fever EXAM: CT ABDOMEN AND PELVIS WITH CONTRAST TECHNIQUE: Multidetector CT imaging of the abdomen and pelvis was performed using the standard protocol following bolus administration of intravenous contrast. RADIATION DOSE REDUCTION: This exam was performed according to the departmental dose-optimization program which includes automated exposure control, adjustment of the mA and/or kV according to patient size and/or use of iterative reconstruction technique. CONTRAST:  OMNIPAQUE IOHEXOL 300 MG/ML  SOLN COMPARISON:  08/12/2022 FINDINGS: Lower chest: There are 2 left lower lobe  pulmonary nodules consistent with progressive pulmonary metastases. 1.4 cm nodule image 12/2 and a 0.8 cm nodule image 15/2. No airspace disease or effusion. Hepatobiliary: Gallbladder is moderately distended without cholelithiasis or cholecystitis. Stable cysts are seen within the liver. Since the prior exam, there is loss of normal fat plane between the inferior right lobe liver and and enlarging colonic mass at the hepatic flexure. Direct invasion of the liver cannot be excluded. Pancreas: Unremarkable. No pancreatic ductal dilatation or surrounding inflammatory changes. Spleen: Normal in size without focal abnormality. Adrenals/Urinary Tract: Bilateral adrenal masses seen previously have enlarged, consistent with metastatic disease. Right adrenal mass measures up to 5.1 cm, previously 3.5 cm. The left adrenal mass measures up to 4.7 cm, previously 3.5 cm. The kidneys enhance normally and symmetrically. No urinary tract calculi or obstructive uropathy. Bladder is unremarkable. Stomach/Bowel: A large necrotic mass at the hepatic flexure of the colon has increased in size, measuring up to 12.5 x 10.1 cm. There is increasing pericolonic fat stranding and mesenteric nodularity consistent with locally invasive disease. As discussed above, there is loss of normal fat plane between the hepatic mass and the inferior margin right lobe liver and direct invasion of the liver cannot be excluded. There is no evidence of bowel obstruction or ileus. Normal appendix right lower quadrant. Vascular/Lymphatic: Increase in the size and number of mesenteric lymph nodes surrounding the hepatic flexure mass, consistent with local nodal metastases. Largest lymph node measures 0.8 cm reference image 56/2. There is also a soft tissue mass contiguous with the left crus of the diaphragm, which appears separate from the enlarging left adrenal mass. This measures 3.5 x 3.0 cm,  new since prior study and consistent with metastatic deposit.  Stable atherosclerosis of the aorta and its branches. Reproductive: Prostate is unremarkable. Other: Trace pelvic free fluid. No free intraperitoneal gas. No abdominal wall hernia. Musculoskeletal: There are no acute or destructive bony abnormalities. Reconstructed images demonstrate no additional findings. IMPRESSION: 1. Enlarging mass at the hepatic flexure of the colon consistent with progressive colonic malignancy. There is increasing pericolonic fat stranding with loss of fat plane between the mass and the right lobe liver concerning for direct hepatic involvement. 2. Increasing mesenteric adenopathy, new soft tissue mass along the crus of the left hemidiaphragm, and enlarging bilateral adrenal masses consistent with progressive intra-abdominal metastases. 3. New and enlarging left lower lobe pulmonary nodules consistent with progressive pulmonary metastatic disease. 4. Trace pelvic free fluid. 5.  Aortic Atherosclerosis (ICD10-I70.0). Electronically Signed   By: Sharlet Salina M.D.   On: 09/18/2022 18:46   DG Chest Port 1 View  Result Date: 09/18/2022 CLINICAL DATA:  Fever and vomiting EXAM: PORTABLE CHEST 1 VIEW COMPARISON:  Radiograph 09/04/2016 and CT chest 08/22/2022 FINDINGS: Stable cardiomediastinal silhouette. Aortic atherosclerotic calcification. No focal consolidation, pleural effusion, or pneumothorax. No displaced rib fractures. IMPRESSION: No active disease. Electronically Signed   By: Minerva Fester M.D.   On: 09/18/2022 17:37    ASSESSMENT & PLAN:   67 y.o. male with:  Large bowel perforation from large right hepatic flexure colonic tumor from diffuse large B-cell lymphoma patient is status post right hemicolectomy and IVC filter placement currently on TPN status post extubation. Newly diagnosed stage IVb large B-cell lymphoma ECHo done today showed normal EF of 55% -port a cath placed by IR -Hepatitis BC and HIV testing negative Large hepatic flexure mass from large B-cell  lymphoma with impending obstruction and high risk for perforation.  Possible infection of the necrotic tissue in the tumor or bowel wall currently on antibiotics. 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  Patient is a lightheadedness and some orthostatic symptoms and severe fatigue.  Rule out adrenal insufficiency. Failure to thrive due to poor p.o. intake bowel issues and new diagnosis of large B-cell lymphoma. Dehydration due to poor p.o. intake Significant abdominal discomfort from his large colonic mass with impending obstruction. Arthritis Dyslipidemia History of gout 11.  History of obstructive sleep apnea not using CPAP at home. 12. Concern for CNS lymphoma vs CVA 13.  Acute right peroneal vein and right posterior tibial vein and left peroneal vein acute DVT on prophylactic anticoagulation and IVC filter in place. 14. Delirium -- possibly terminal delirium PLAN: -met with patient and wife and  several family members at bedside. -patient has significantly deteriorated in the last 24 hours and appears to be having terminal delirium. -he has moved to comfort care goals and is being surrounding by several loving family members. -no further oncologic interventions with focus on end of life cares. -appear excellent care by Hospital medicine and palliative care teams and ICU nursing staff. -provided counseling and support to family members who are very grateful for all the cares. -oncology available if any other questions arise.  .The total time spent in the appointment was 25 minutes* .  All of the patient's questions were answered with apparent satisfaction. The patient knows to call the clinic with any problems, questions or concerns.   Wyvonnia Lora MD MS AAHIVMS [facility] Hematology/Oncology [facility] [place]  .*Total Encounter Time as defined  by  the Centers for Medicare and Medicaid Services includes, in addition to the face-to-face time of a patient visit (documented in the note above) non-face-to-face time: obtaining and reviewing outside history, ordering and reviewing medications, tests or procedures, care coordination (communications with other health care professionals or caregivers) and documentation in the medical record.

## 2022-10-28 NOTE — Progress Notes (Addendum)
Nutrition Follow-up  DOCUMENTATION CODES:   Severe malnutrition in context of chronic illness  INTERVENTION:   Addendum 1500: Noted that pt is now transitioning to comfort care. RD will sign off.  -TPN management per Pharmacy -Daily weights while on TPN  -1 packet Juven BID, each packet provides 95 calories, 2.5 grams of protein (collagen), and 9.8 grams of carbohydrate (3 grams sugar); also contains 7 grams of L-arginine and L-glutamine, 300 mg vitamin C, 15 mg vitamin E, 1.2 mcg vitamin B-12, 9.5 mg zinc, 200 mg calcium, and 1.5 g  Calcium Beta-hydroxy-Beta-methylbutyrate to support wound healing   -Continue Prosource TF 20 BID via tube, each provides 80 kcals and 20g protein  -Ensure BID as tolerated  -Consider Calorie Count once tolerating PO more  NUTRITION DIAGNOSIS:   Severe Malnutrition related to chronic illness (non-Hodgkin's lymphoma) as evidenced by mild fat depletion, mild muscle depletion, energy intake < or equal to 75% for > or equal to 1 month, percent weight loss (11% in less than 1 month).  GOAL:   Patient will meet greater than or equal to 90% of their needs  MONITOR:   Diet advancement, Labs, Weight trends, I & O's, PO intake  ASSESSMENT:   67 y.o. male admits related to abdominal pain and poor oral intakes. PMH includes arthritis and high cholesterol. 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  Visited with patient and wife at bedside. Pt just returned from a CT scan. Pt very shaky and visibly uncomfortable. Per pt's wife, pt did not eat much yesterday PO and she feels he won't take in much today either. She is hopeful that pt will eventually eat more. States he is more willing to take clears, refused his Ensure yesterday after trying a sip. Did the same with Magic cups. Explained the role of Juven in wound healing and she will try to get him to drink this once offered.  Pt continues to receive Prosource TF 20  BID via tube, which is providing 160 kcals and 40g protein.  TPN to continue at goal rate of 80 ml/hr, providing ~2188 kcals and 99g protein.  Admission weight: 178 lbs Current weight: 187 lbs Per nursing documentation, pt with mild BUE and BLE edema.  Medications: Colace, Miralax, Zofran, Compazine  Labs reviewed: CBGs: 86-105 Low Na   Diet Order:   Diet Order             Diet full liquid Room service appropriate? Yes; Fluid consistency: Thin  Diet effective now                   EDUCATION NEEDS:   Education needs have been addressed  Skin:  Skin Assessment: Reviewed RN Assessment Skin Integrity Issues:: Incisions Incisions: Abdomen  Last BM:  6/14 -type 7 -ostomy  Height:   Ht Readings from Last 1 Encounters:  10/02/22 5\' 8"  (1.727 m)    Weight:   Wt Readings from Last 1 Encounters:  10/02/2022 85.2 kg    Ideal Body Weight:  70 kg  BMI:  Body mass index is 28.56 kg/m.  Estimated Nutritional Needs:   Kcal:  2250-2450 kcals  Protein:  120-140 grams  Fluid:  >/= 2.2L  Tilda Franco, MS, RD, LDN Inpatient Clinical Dietitian Contact information available via Amion

## 2022-10-28 NOTE — Progress Notes (Signed)
    Patient Name: Justin Cameron           DOB: [birth year]  MRN: [id number]       OVERNIGHT EVENT    Notified by RN that patient has expired at 19:26.  2 RN verified. Patient was comfort care.   Family is at bedside.   Anthoney Harada, DNP, ACNPC- AG [facility]

## 2022-10-28 NOTE — Progress Notes (Signed)
PHARMACY - TOTAL PARENTERAL NUTRITION CONSULT NOTE   Indication: Prolonged ileus  Patient Measurements: Height: 5\' 8"  (172.7 cm) Weight: 85.2 kg (187 lb 13.3 oz) IBW/kg (Calculated) : 68.4 TPN AdjBW (KG): 81 Body mass index is 28.56 kg/m.  Assessment: 35 yoM admitted on 5/23 with abdominal pain related to newly diagnosed Stage IV large B cell lymphoma with a large hepatic flexure mass, partial large bowel obstruction.  He was supposed to start chemo, but developed severe acute abdominal pain on 5/28.  NG tube placed, imaging to confirm placement shows possible free air and he was taken to OR on 5/28. Pharmacy consulted to dose TPN for prolonged ileus.    CT with increased fluid collection in right abdomen around surgical drain. IR placed drain 6/3. Patient later intubated and started on vasopressors. Patient with extravascular volume, given albumin 6/4. TPN concentrated 6/4 in attempt to reduce fluid burden. Patient remains intubated but with reduced vasopressor requirements on 6/5. Fever curve and WBC improved. Patient extubated 6/6 and off of vasopressors. Tube feeds started at trickle 6/6, but patient with episode of vomiting 6/7 am and tube feeds subsequently held.  Glucose / Insulin: No hx DM, no PTA medications. BG 103 this AM. -CBGs at goal < 150 and stable. No SSI required >48 hrs. CBG and SSI discontinued on 6/13 -Steroids stopped 6/11 Electrolytes: Na (131) slightly low. All other lytes WNL, including CorrCa (9.5) -Bicarb slightly low Renal: SCr low, BUN WNL and stable Hepatic: AST/ALT, Tbili, Alk Phos WNL. TG wnl. Albumin low at 2 Intake / Output: 24 hr, overall net -618 mL - Drains 60 mL (decreased) - Urine 2000 mL - ostomy: 1150 mL GI Imaging:  -6/2 CT abd/pelvis - large irregular fluid collection in the right abdomen lies in the location of the previous seen large necrotic mass -5/28  Lucency in the right upper quadrant was not seen on the previous images. Free  intraperitoneal air is not excluded.  6/9 Abd xray shows no obstructive gas pattern. 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  - 6/3 IR drainage catheter placed into posterior pelvic fluid collection  Central access: Port (09/20/22), Triple lumen PICC placed 5/29 TPN start date: 5/29  Nutritional Goals: RD Assessment: updated 6/7 Estimated Needs Total Energy Estimated Needs: 2250-2450 kcals Total Protein Estimated Needs: 120-140 grams Total Fluid Estimated Needs: >/= 2.2L  Current Nutrition:  -TPN at goal -TFs remain on hold -FLD -Prosource TF20 BID per tube (40g of protein and 160 kcal daily) -Ensure BID (1 charted/24 hrs)  Significant Events: -6/11: D10 infusion ordered per MD while TPN disconnected for MRI -6/12: Reduced total protein and kcal in TPN w/resumption of Prosource  Plan:   At 1800: Continue TPN at goal rate of 80 mL/hr Provides 100 g of protein and 2188 kcal per day Electrolytes in TPN: Increase Na Na 55 mEq/L, K 80 mEq/L (max), Ca 3 mEq/L, Mg 7 mEq/L, and Phos 20 mmol/L.  Cl:Ac 1:1 Add standard MVI and trace elements to TPN Stop SSI/CBG checks - consistently stable at goal  Monitor TPN labs on Mon/Thurs. Recheck electrolytes with AM labs tomorrow  Cindi Carbon, PharmD, BCPS 10/27/2022 8:40 AM

## 2022-10-28 NOTE — Consult Note (Signed)
WOC Nurse ostomy follow up Flexible convex ostomy pouch is intact with good seal, mod amt liquid brown stool. Extra supplies left at the bedside for staff nurse's use.  WOC nurse will continue to follow weekly for ostomy needs; please refer to surgical team for further questions regarding abd wound and plan of care.  Thank-you,  Cammie Mcgee MSN, RN, CWOCN, [place], CNS [phone]

## 2022-10-28 NOTE — Progress Notes (Addendum)
I had a conversation with the patient wife and daughter I explained to them the results of the CT that showed multiple abscesses he did after several hours became hypotensive relating that he was in pain. He then became agitated trying to pull things, we had to give him IV bolus of narcotics and he calmed down.  We had another conversation extensively about his prognosis with the multiple abscesses seen on CT with the wife, patient and daughter and his overall prognosis and the patient expressed that he did not want to be hurting and that he was ready, his wife and daughter agreed.  They all agreed to stop every medication and lab draw that were not related to comfort. Will go ahead and start him on a morphine drip and move towards comfort care.  He has a extremely poor prognosis will probably pass away in house I have expressed this to the family.  Once he is more comfortable

## 2022-10-28 NOTE — Progress Notes (Signed)
Daily Progress Note   Patient Name: Justin Cameron       Date: 10/21/2022 DOB: Oct 20, 1955  Age: 67 y.o. MRN#: [id number] Attending Physician: Marinda Elk, MD Primary Care Physician: Daisy Floro, MD Admit Date: 09/08/2022  Reason for Consultation/Follow-up: Establishing goals of care  Subjective: Patient was seen in the morning after he had just drank his contrast, he was to have a CT scan of the abdomen, he complained of hiccups and nausea.  Return to to bedside again in the afternoon with the patient being agitated restless uncomfortable hypotensive and wife and friend at bedside.  Daughter is also on the wait.  Overall plans appear to be shifting towards comfort-focused care.  Also discussed with surgical colleague, nursing care and hospital medicine service.   Length of Stay: 22  Current Medications: Scheduled Meds:    Continuous Infusions:  sodium chloride Stopped (10/02/22 2010)   sodium chloride 10 mL/hr at 10/24/2022 1304   morphine      PRN Meds: sodium chloride, sodium chloride, acetaminophen **OR** acetaminophen, antiseptic oral rinse, glycopyrrolate **OR** glycopyrrolate **OR** glycopyrrolate, haloperidol **OR** haloperidol **OR** haloperidol lactate, LORazepam **OR** LORazepam **OR** LORazepam, morphine, ondansetron **OR** ondansetron (ZOFRAN) IV, polyvinyl alcohol  Physical Exam         Weak appearing gentleman Diaphoretic Anxious Monitor noted Drains on abdomen No edema  Vital Signs: BP (!) 126/42 (BP Location: Left Arm)   Pulse (!) 153   Temp (!) 102.3 F (39.1 C) (Axillary) Comment: MD aware, tylenol & ibuprofen given  Resp (!) 21   Ht 5\' 8"  (1.727 m)   Wt 85.2 kg   SpO2 94%   BMI 28.56 kg/m  SpO2: SpO2: 94 % O2 Device: O2 Device:  Nasal Cannula O2 Flow Rate: O2 Flow Rate (L/min): 2 L/min  Intake/output summary:  Intake/Output Summary (Last 24 hours) at 10/08/2022 1352 Last data filed at 10/08/2022 1304 Gross per 24 hour  Intake 2985.26 ml  Output 2910 ml  Net 75.26 ml   LBM: Last BM Date : 10/08/2022 Baseline Weight: Weight: 81 kg Most recent weight: Weight: 85.2 kg       Palliative Assessment/Data:      Patient Active Problem List   Diagnosis Date Noted   Abnormal brain CT 10/09/2022   Counseling regarding advance care planning and goals of  care 10/09/2022   MRI of brain abnormal 10/09/2022   Delirium due to another medical condition, acute, mixed level of activity 10/07/2022   DVT (deep venous thrombosis) (HCC) 10/07/2022   S/P IVC filter 10/07/2022   Acute respiratory failure with hypoxia (HCC) 10/03/2022   Septic shock (HCC) 10/01/2022   Other partial intestinal obstruction (HCC) 09/27/2022   Right upper quadrant abdominal pain 08/29/2022   Protein-calorie malnutrition, severe 09/23/2022   Cancer related pain 09/20/2022   Encounter for antineoplastic chemotherapy 09/20/2022   Large cell (diffuse) non-Hodgkin's lymphoma (HCC) 09/23/2022    Palliative Care Assessment & Plan   Patient Profile:   Assessment: CT scan today showing multiple abscesses Acute, possibly terminal delirium. 67 year old gentleman with history of anxiety depression OSA, recently diagnosed with large B-cell lymphoma. Patient admitted with abdominal pain failure to thrive found to have colon perforation large abscess, status post ex lap with right hemicolectomy status post drainage by [facility] course complicated by patient requiring pressors and intubation, subsequently extubated. Medical oncology and surgical services following, patient remains admitted to hospital medicine service, palliative following for ongoing goals of care discussions. Recommendations/Plan: Chart reviewed, patient seen and examined, discussed  with patient and wife a couple of times throughout the course of the day.  Discussed with various members of the interdisciplinary team.  Patient continues to have escalating symptom burden and ongoing decline and decompensation in spite of aggressive interventions.  Overall, plan of care appears to be shifting towards comfort-focused care.  Patient appears to have markedly limited prognosis.  Will add low-dose IV haloperidol x 1 to help with agitation and hiccups.  Continue comfort focused care.  Goals of Care and Additional Recommendations: Limitations on Scope of Treatment: Full Comfort Care  Code Status:    Code Status Orders  (From admission, onward)           Start     Ordered   10/27/2022 1345  Do not attempt resuscitation (DNR)  Continuous       Question Answer Comment  If patient has no pulse and is not breathing Do Not Attempt Resuscitation   If patient has a pulse and/or is breathing: Medical Treatment Goals COMFORT MEASURES: Keep clean/warm/dry, use medication by any route; positioning, wound care and other measures to relieve pain/suffering; use oxygen, suction/manual treatment of airway obstruction for comfort; do not transfer unless for comfort needs.   Consent: Discussion documented in EHR or advanced directives reviewed      10/01/2022 1346           Code Status History     Date Active Date Inactive Code Status Order ID Comments User Context   10/03/2022 1114 10/10/2022 1346 DNR [id number]  Duayne Cal, NP Inpatient   09/30/2022 1826 10/03/2022 1114 Full Code [id number]  [name], NP Inpatient   09/23/2022 1536 09/30/2022 1826 Full Code [id number]  [name] Inpatient   09/06/2022 1656 08/31/2022 1536 Full Code [id number]  [name], MD Inpatient      Advance Directive Documentation    Flowsheet Row Most Recent Value  Type of Advance Directive Healthcare Power of Attorney, Living will  Pre-existing out of facility DNR order (yellow form  or pink MOST form) --  "MOST" Form in Place? --       Prognosis:  Hours - Days  Discharge Planning: Anticipated Hospital Death  Care plan was discussed with patient, wife, nursing colleague, chaplain, [facility] MD, surgical colleague and various other members of the  interdisciplinary team.  Thank you for allowing the Palliative Medicine Team to assist in the care of this patient.  high MDM  greater than 50%  of this time was spent counseling and coordinating care related to the above assessment and plan.  Rosalin Hawking, MD  Please contact Palliative Medicine Team phone at [phone] for questions and concerns.

## 2022-10-28 NOTE — Death Summary Note (Signed)
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  Hospital Diagnoses: Principal Problem:   Large cell (diffuse) non-Hodgkin's lymphoma (HCC) Active Problems:   Cancer related pain   Protein-calorie malnutrition, severe   Septic shock (HCC)   Acute respiratory failure with hypoxia (HCC)   Delirium due to another medical condition, acute, mixed level of activity   DVT (deep venous thrombosis) (HCC)   S/P IVC filter   Abnormal brain CT   Counseling regarding advance care planning and goals of care   MRI of brain abnormal   Hospital Course: 67yo male with h/o HLD, anxiety/depression, OSA, and recent diagnosis of large B-cell lymphoma who presented on 10/02/22 with progressive abdominal pain and FTT.  He was found to have an aggressive malignancy complicated by colon perforation with large abscess and suffered a severe SIRS response on 6/3 after IR placed a drain, requiring intubation and pressors.  He is being treated with Ceftriaxone.  He has not tolerated tube feeds and is on TPN.  Surgery and oncology are following.      Significant Events: October 02, 2022: Direct admit from home per medical oncology to [facility] 2022/10/02: IR consulted and Port-A-Cath placed 5/24: Started rituximab per oncology 5/28: General surgery consulted for large bowel obstruction in setting of stage IV DLBCL; exploratory laparotomy with right hemicolectomy and end ileostomy by Dr. Luisa Hart; transfer to ICU under PCCM service as remained on ventilatory support following surgery; started on TPN 5/29: IR consulted for IVC filter placement 5/30: Transfused 1 unit PRBC 5/31: Transferred back to hospitalist service 6/3: IR consulted for CT drain placement due to  repeat CT findings of large collection in right abdomen and surgical bed; following drain placement patient became tachycardic, hypotensive febrile, PCCM reconsulted and patient was intubated and transferred back to ICU with septic shock with broadening of antibiotics 6/6: GOC discussed, CODE STATUS changed to DNR, extubated 6/7: Weaning stress dose steroids 6/8: Transferred back to [facility] 6/11 : Wound VAC was discontinued due to ongoing clogging  Assessment and Plan: No notes have been filed under this hospital service. Service: Hospitalist Septic shock secondary to right lower quadrant E. coli abscess leading to colonic perforation in the setting of large B-cell lymphoma: General surgery was consulted and performed laparotomy he was also placed on TPN. Wound VAC had to be discontinued due to ongoing clogging. He was initially on pressors and intubated eventually extubated and weaned off pressors. Oncology was consulted and he was not a candidate for chemotherapy. On admission he was started empirical antibiotics with his blood cultures and wound culture grew E. coli. He then started developing fevers and became hypotensive CT scan of the abdomen and pelvis was done that showed multiple abscesses. Palliative care was involved. After long discussion with the family and the patient they decided to move towards comfort care. All medications and labs were stopped. He was started on a morphine drip and he passed away calmly with his family at bedside.        Consultations: PCCM ONcology General Surgery  The results of significant diagnostics from this hospitalization (including imaging, microbiology, ancillary and laboratory) are listed below for reference.   Significant Diagnostic Studies: CT ABDOMEN PELVIS W CONTRAST  Result  Date: 10/15/2022 CLINICAL DATA:  Abdominal pain status post surgery EXAM: CT ABDOMEN AND PELVIS WITH CONTRAST TECHNIQUE: Multidetector CT imaging of the abdomen and  pelvis was performed using the standard protocol following bolus administration of intravenous contrast. RADIATION DOSE REDUCTION: This exam was performed according to the departmental dose-optimization program which includes automated exposure control, adjustment of the mA and/or kV according to patient size and/or use of iterative reconstruction technique. CONTRAST:  OMNIPAQUE IOHEXOL 350 MG/ML SOLN COMPARISON:  09/29/2022 FINDINGS: Lower chest: Trace bilateral pleural effusions. Bibasilar atelectasis. Hepatobiliary: Multiple hypodense fluid attenuating hepatic masses consistent with small cysts. 4.4 cm subcapsular fluid collection with a small amount air along the peripheral aspect of the right hepatic lobe consistent with an abscess similar to the prior exam. 10 mm subcapsular hepatic abscess along the periphery of the anterolateral right hepatic lobe. No gallstones, gallbladder wall thickening, or biliary dilatation. Gallbladder is distended. Pancreas: Unremarkable. No pancreatic ductal dilatation or surrounding inflammatory changes. Spleen: Normal in size without focal abnormality. Adrenals/Urinary Tract: Stable bilateral renal masses unchanged compared with the prior CT with the right adrenal mass measuring 5.2 cm and the 2 left adrenal masses measuring 4.2 and 3.7 cm respectively. Kidneys are normal, without renal calculi, focal lesion, or hydronephrosis. Bladder is unremarkable. Stomach/Bowel: Stomach is within normal limits. No evidence of bowel wall thickening, distention, or inflammatory changes. Appendix is normal. Vascular/Lymphatic: Abdominal aortic atherosclerosis. IVC filter with the tip at the level of the left renal vein. No lymphadenopathy. Reproductive: Prostate is unremarkable. Other: 14 x 5.5 x 20 cm fluid collection in the right side of the abdomen with a surgical drain within the collection most concerning for an abscess. 7 x 1.3 cm complex fluid collection in the pelvis with a pigtail  catheter within the collection. 3.3 x 3.7 x 2.1 cm complex fluid collection along the left anterior lower abdomen adjacent to the sigmoid colon consistent with an abscess. 3.7 cm complex fluid collection in the left lateral mid abdomen along the anti mesenteric aspect of the descending colon which may reflect a hematoma versus abscess unchanged compared with 09/29/2022. Musculoskeletal: No acute osseous abnormality. No aggressive osseous lesion. Grade 1 anterolisthesis of L4 on L5 secondary to facet disease. IMPRESSION: 1. A 14 x 5.5 x 20 cm fluid collection in the right side of the abdomen with a surgical drain within the collection most concerning for an abscess. 2. A 7 x 1.3 cm complex fluid collection in the pelvis with a pigtail catheter within the collection decreased in size compared with the prior exam. 3. A 3.3 x 3.7 x 2.1 cm complex fluid collection along the left anterior lower abdomen adjacent to the sigmoid colon consistent with an abscess. 4. A 3.7 cm complex fluid collection in the left lateral mid abdomen along the anti mesenteric aspect of the descending colon which may reflect a hematoma versus abscess unchanged compared with 09/29/2022. 5. A 4.4 cm subcapsular fluid collection with a small amount air along the peripheral aspect of the right hepatic lobe consistent with an abscess similar to the prior exam. 10 mm subcapsular hepatic abscess along the periphery of the anterolateral right hepatic lobe. 6. Stable bilateral adrenal masses. 7.  Aortic Atherosclerosis (ICD10-I70.0). Electronically Signed   By: Elige Ko M.D.   On: 10/15/2022 11:58   CT ANGIO HEAD NECK W WO CM  Result Date: 10/09/2022 CLINICAL DATA:  Stroke, follow up EXAM: CT ANGIOGRAPHY HEAD AND NECK WITH AND WITHOUT CONTRAST TECHNIQUE: Multidetector CT imaging of  the head and neck was performed using the standard protocol during bolus administration of intravenous contrast. Multiplanar CT image reconstructions and MIPs were  obtained to evaluate the vascular anatomy. Carotid stenosis measurements (when applicable) are obtained utilizing NASCET criteria, using the distal internal carotid diameter as the denominator. RADIATION DOSE REDUCTION: This exam was performed according to the departmental dose-optimization program which includes automated exposure control, adjustment of the mA and/or kV according to patient size and/or use of iterative reconstruction technique. CONTRAST:  OMNIPAQUE IOHEXOL 350 MG/ML SOLN COMPARISON:  MR Head 10/08/22 FINDINGS: CT HEAD FINDINGS Brain: Evolving right MCA territory infarct, as seen on recent prior brain MRI. No CT evidence of new infarct. There is trace curvilinear hyperdensity in the anterior right temporal lobe (series 5, image 15), which could represent either petechial or trace subarachnoid hemorrhage. No extra-axial fluid collection. No hydrocephalus. No significant midline shift Vascular: No hyperdense vessel or unexpected calcification. Skull: Normal. Negative for fracture or focal lesion. Sinuses/Orbits: No acute finding. Other: None. Review of the MIP images confirms the above findings CTA NECK FINDINGS Aortic arch: Standard branching. Imaged portion shows no evidence of aneurysm or dissection. No significant stenosis of the major arch vessel origins. Right carotid system: No evidence of dissection, stenosis (50% or greater), or occlusion. Left carotid system: No evidence of dissection, stenosis (50% or greater), or occlusion. Vertebral arteries: Codominant. No evidence of dissection, stenosis (50% or greater), or occlusion. Skeleton: Negative. Other neck: Negative. Upper chest: Mild paraseptal emphysema. Enteric tube in place. There is circumferential esophageal wall thickening, which can be seen in the setting of esophagitis. Review of the MIP images confirms the above findings CTA HEAD FINDINGS Anterior circulation: No significant stenosis, proximal occlusion, aneurysm, or vascular  malformation. Posterior circulation: No significant stenosis, proximal occlusion, aneurysm, or vascular malformation. Venous sinuses: As permitted by contrast timing, patent. Anatomic variants: None Review of the MIP images confirms the above findings IMPRESSION: 1. Evolving right MCA territory infarct, as seen on recent prior brain MRI. No CT evidence of a new infarct. 2. Trace curvilinear hyperdensity in the anterior right temporal lobe, which could represent either petechial or trace subarachnoid hemorrhage. Consider short interval follow up. 3. No intracranial large vessel occlusion or significant stenosis. 4. No hemodynamically significant stenosis in the neck. 5. Circumferential esophageal wall thickening, which can be seen in the setting of esophagitis. Emphysema (ICD10-J43.9). Electronically Signed   By: Lorenza Cambridge M.D.   On: 10/06/2022 11:53   DG CHEST PORT 1 VIEW  Result Date: 10/09/2022 CLINICAL DATA:  Cough EXAM: PORTABLE CHEST 1 VIEW COMPARISON:  Chest x-ray dated October 02, 2022 FINDINGS: Stable position of right arm PICC right chest wall port. Interval extubation. Enteric tube tip is in the stomach. Cardiac and mediastinal contours within normal limits. Mild elevation of the right hemidiaphragm. Mild bibasilar atelectasis. No evidence of pleural effusion or pneumothorax. IMPRESSION: Interval extubation.  Mild bibasilar atelectasis. Electronically Signed   By: Allegra Lai M.D.   On: 10/10/2022 08:34   MR BRAIN W WO CONTRAST  Result Date: 10/08/2022 CLINICAL DATA:  Concern for CNS involvement from high-grade lymphoma EXAM: MRI HEAD WITHOUT AND WITH CONTRAST TECHNIQUE: Multiplanar, multiecho pulse sequences of the brain and surrounding structures were obtained without and with intravenous contrast. CONTRAST:  9mL GADAVIST GADOBUTROL 1 MMOL/ML IV SOLN COMPARISON:  No prior MRI available, correlation is made with CT head 610 2024 FINDINGS: Brain: Gyral enhancement in a sharply demarcated,  contiguous area involving the right temporal lobe,  occipital lobe, and inferior parietal lobe. These areas are associated with increased T2 hyperintense signal and gyral swelling, as well as hemosiderin deposition, likely petechial hemorrhage. Some areas of cortex to have increased signal on diffusion-weighted imaging with ADC correlates (for example series 5, image 19 and series 6, image 19), which may be related to susceptibility. Mild mass effect on the right occipital and temporal horns of the lateral ventricle, but no significant midline shift. No other restricted diffusion to suggest acute or subacute infarct. No other parenchymal hemorrhage or midline shift. No hydrocephalus or extra-axial collection. Vascular: Normal arterial flow voids. Normal arterial and venous enhancement. Skull and upper cervical spine: Normal marrow signal. Sinuses/Orbits: Clear paranasal sinuses. No acute finding in the orbits. Other: The mastoid air cells are well aerated. IMPRESSION: Gyral enhancement in a sharply demarcated, contiguous area involving the right temporal lobe, occipital lobe, and inferior parietal lobe, with associated gyral swelling and hemosiderin deposition, likely petechial hemorrhage. This appears localized to the posterior MCA territory, and is favored to represent a subacute infarct, although lymphomatous involvement could appear similar. These results will be called to the ordering clinician or representative by the Radiologist Assistant, and communication documented in the PACS or [employer]. Electronically Signed   By: Wiliam Ke M.D.   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 EXAM: CT HEAD WITHOUT AND WITH CONTRAST TECHNIQUE: Contiguous axial images were obtained from the base of the skull through the vertex without and with intravenous contrast. RADIATION DOSE REDUCTION: This exam was  performed according to the departmental dose-optimization program which includes automated exposure control, adjustment of the mA and/or kV according to patient size and/or use of iterative reconstruction technique. CONTRAST:  75mL OMNIPAQUE IOHEXOL 300 MG/ML  SOLN COMPARISON:  None Available. FINDINGS: Brain: Leptomeningeal and gyral enhancement along the posterior right temporal lobe, extending into the inferior parietal and occipital lobes with subjacent hypoattenuation of the gray and white matter, suspicious for CNS involvement of lymphoma. No significant mass effect or midline shift. No acute hemorrhage. No hydrocephalus or extra-axial collection. Vascular: No hyperdense vessel or unexpected calcification. Visible vessels are patent. Skull: No calvarial fracture or suspicious bone lesion. Skull base is unremarkable. Sinuses/Orbits: Unremarkable. Other: None. IMPRESSION: Leptomeningeal and gyral enhancement along the posterior right temporal lobe, extending into the inferior right parietal and occipital lobes with subjacent hypoattenuation of the gray and white matter, suspicious for CNS involvement of lymphoma. No significant mass effect or midline shift. Electronically Signed   By: Orvan Falconer M.D.   On: 10/07/2022 12:57   DG Abd 1 View  Result Date: 10/06/2022 CLINICAL DATA:  Inpatient, abdominal pain, small-bowel obstruction EXAM: ABDOMEN - 1 VIEW COMPARISON:  10/03/2022 abdominal radiograph FINDINGS: IVC filter again noted in the medial right abdomen. Transgluteal right pelvic pigtail catheter overlies the midline lower sacrum. Surgical drain overlies the right abdomen. Right lower quadrant ostomy. No disproportionately dilated small bowel loops. Decreased mild to moderate stool in the remnant colon. No evidence of pneumatosis or pneumoperitoneum on this supine view. Marked lumbar spondylosis. IMPRESSION: Nonobstructive bowel gas pattern. Decreased mild to moderate stool in the remnant colon.  Electronically Signed   By: Delbert Phenix M.D.   On: 10/06/2022 16:13   DG Abd Portable 1V  Result Date: 10/03/2022 CLINICAL DATA:  NG tube placement. EXAM: PORTABLE ABDOMEN - 1 VIEW COMPARISON:  09/16/2022. FINDINGS: Nasogastric tube passes below  the diaphragm, tip in the mid stomach. Normal bowel gas pattern. IMPRESSION: Well-positioned nasal/orogastric tube. Electronically Signed   By: Amie Portland M.D.   On: 10/03/2022 12:56   DG Chest Port 1 View  Result Date: 10/02/2022 CLINICAL DATA:  Dyspnea. EXAM: PORTABLE CHEST 1 VIEW COMPARISON:  September 30, 2022. FINDINGS: Stable cardiomediastinal silhouette. Endotracheal and nasogastric tubes are unchanged in position. Right internal jugular Port-A-Cath is unchanged. Right-sided PICC line is unchanged. Minimal bibasilar subsegmental atelectasis is noted. Bony thorax is unremarkable. IMPRESSION: Stable support apparatus. Minimal bibasilar subsegmental atelectasis. Electronically Signed   By: Lupita Raider M.D.   On: 10/02/2022 11:22   DG CHEST PORT 1 VIEW  Result Date: 09/30/2022 CLINICAL DATA:  OG tube and endotracheal tube placement. EXAM: PORTABLE CHEST 1 VIEW COMPARISON:  09/11/2022 FINDINGS: An endotracheal tube with tip 2.5 cm above the carina, RIGHT PICC line with tip difficult to visualize but appears to overlie the RIGHT atrium and a RIGHT Port-A-Cath with tip overlying the RIGHT atrium as well. An enteric tube is noted with tip overlying the mid stomach. Bibasilar opacities/atelectasis noted. There is no evidence of pneumothorax or large pleural effusion. IMPRESSION: 1. RIGHT PICC line placement but otherwise unchanged appearance of the chest. Other support apparatus as described with mild bibasilar opacities/atelectasis. Electronically Signed   By: Harmon Pier M.D.   On: 09/30/2022 17:26   CT GUIDED PERITONEAL/RETROPERITONEAL FLUID DRAIN BY PERC CATH  Result Date: 09/30/2022 INDICATION: [id number] Postprocedural intraabdominal abscess [id number] EXAM:  CT-guided pelvic drain placement TECHNIQUE: Multidetector CT imaging of the abdomen and pelvis was performed following the standard protocol without IV contrast. RADIATION DOSE REDUCTION: This exam was performed according to the departmental dose-optimization program which includes automated exposure control, adjustment of the mA and/or kV according to patient size and/or use of iterative reconstruction technique. MEDICATIONS: The patient is currently admitted to the hospital and receiving intravenous antibiotics. The antibiotics were administered within an appropriate time frame prior to the initiation of the procedure. ANESTHESIA/SEDATION: Moderate (conscious) sedation was employed during this procedure. A total of Versed 2 mg and Fentanyl 100 mcg was administered intravenously by the radiology nurse. Total intra-service moderate Sedation Time: 19 minutes. The patient's level of consciousness and vital signs were monitored continuously by radiology nursing throughout the procedure under my direct supervision. COMPLICATIONS: None immediate. PROCEDURE: Informed written consent was obtained from the patient after a thorough discussion of the procedural risks, benefits and alternatives. All questions were addressed. Maximal Sterile Barrier Technique was utilized including caps, mask, sterile gowns, sterile gloves, sterile drape, hand hygiene and skin antiseptic. A timeout was performed prior to the initiation of the procedure. The patient was placed supine on the exam table. Limited CT of the abdomen and pelvis was performed for planning purposes. This demonstrated significant improvement of gas and fluid collection in the right lower quadrant. The surgically placed drainage catheter courses through this collection. Also noted was unchanged appearance a sizable posterior pelvic collection. Given these findings, the decision was made to proceed with placement of a transgluteal pelvic drainage catheter. The patient was  repositioned in the lateral decubitus position. Skin entry site was marked, and the overlying skin was prepped and draped in the standard sterile fashion. Local analgesia was obtained with 1% lidocaine. Using intermittent CT fluoroscopy, a 19 gauge Yueh catheter was advanced towards the identified posterior pelvic fluid collection using a right transgluteal approach. Location was confirmed with CT and return of purulent material. Over an Amplatz wire, the percutaneous  tract was serially dilated to accommodate a 12 French locking drainage catheter. Location was again confirmed with CT and return of additional fluid. Locking loop was formed, the drainage catheter was secured to the skin using silk suture and a dressing. It was attached to bulb suction. The patient tolerated the procedure well without immediate complication. IMPRESSION: 1. CT imaging demonstrated significant improvement in the right lower quadrant collection with surgical drainage catheter in place. Given these findings, the decision was made to proceed with drainage of the unchanged posterior pelvic collection. 2. Successful CT-guided placement of a 12 French locking drainage catheter into the posterior pelvic collection via a right transgluteal approach. Drainage catheter placed to bulb suction. Electronically Signed   By: Olive Bass M.D.   On: 09/30/2022 16:21   CT ABDOMEN PELVIS W CONTRAST  Result Date: 09/29/2022 CLINICAL DATA:  Abdominal pain.  Postop. EXAM: CT ABDOMEN AND PELVIS WITH CONTRAST TECHNIQUE: Multidetector CT imaging of the abdomen and pelvis was performed using the standard protocol following bolus administration of intravenous contrast. RADIATION DOSE REDUCTION: This exam was performed according to the departmental dose-optimization program which includes automated exposure control, adjustment of the mA and/or kV according to patient size and/or use of iterative reconstruction technique. CONTRAST:  80mL OMNIPAQUE IOHEXOL 300  MG/ML  SOLN COMPARISON:  09/18/2022. FINDINGS: Lower chest: Small right and trace left pleural effusions. Associated dependent lower lobe atelectasis, also greater on the right. Small focus of atelectasis at the base of the left upper lobe lingula. Hepatobiliary: Liver normal in size and attenuation. Multiple small low-attenuation liver masses consistent with cysts stable from the prior CT. Normal gallbladder. No bile duct dilation. Pancreas: Unremarkable. Spleen: Normal. Adrenals/Urinary Tract: Bilateral adrenal masses, unchanged from the recent prior CT, right 5.1 cm, 2 on the left 3.6 and 4.2 cm. Normal kidneys, ureters and bladder. Stomach/Bowel: Since the prior CT, abdominal surgery has been performed. There is a midline incision with the skin and subcutaneous fat open, fascia closed. Colon has been ligated at the level of the right mid transverse section. The ileum extends into a right anterior mid abdomen ostomy. There are postoperative collections. The largest is a heterogeneous right abdominal collection in the area of the necrotic mass. Soft tissue attenuation along some of the margins of this collection may reflect necrotic tissue or decompressed small bowel or a combination. The collection contains fluid and air. It measures approximately 23 cm from superior to inferior by 16 x 10 cm transversely. A drainage catheter lies within the mid to upper portion of this collection. Small subcapsular collection lies along the right liver margin. A subcapsular component of the larger collection lies along the anterior inferior aspect of the right liver lobe. Fluid is also seen distending the posterior pelvic recess. Remaining colon shows a generalized mild increase in the colonic stool burden, but no wall thickening or convincing inflammation. There is no small bowel dilation to suggest obstruction. Stomach is moderately distended, but without wall thickening. Tiny bubbles of free air lie just deep to the upper  abdominal incision line. Vascular/Lymphatic: Well-positioned vena cava filter. Aortic atherosclerosis. Prominent gastrohepatic ligament lymph nodes. Mesenteric nodes noted on the prior CT are less well-defined due to postoperative fluid/edema. Reproductive: Unremarkable. Other: None. Musculoskeletal: No fracture or acute finding. No osteoblastic or osteolytic lesions. IMPRESSION: 1. Status post abdominal surgery since the previous CT scan. Large irregular fluid collection in the right abdomen lies in the location of the previous seen large necrotic mass. Some of this collection  may reflect the residual mass. Collection extends from the subcapsular inferior right liver to the right pelvis measuring 23 x 10 x 16 cm. Surgical drain lies within the upper to mid aspect of this collection. 2. 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 No evidence of bowel obstruction. 3. Findings of metastatic disease noted on the recent prior CT are unchanged. Electronically Signed   By: Amie Portland M.D.   On: 09/29/2022 16:02   VAS [place] LOWER EXTREMITY VENOUS (DVT)  Result Date: 09/25/2022  Lower Venous DVT Study Patient Name:  Justin Cameron  Date of Exam:   09/25/2022 Medical Rec #: [id number]            Accession #:    [id number] Date of Birth: 1956/01/11             Patient Gender: M Patient Age:   55 years Exam Location:  [facility] Procedure:      VAS [place] LOWER EXTREMITY VENOUS (DVT) Referring Phys: Hosie Spangle --------------------------------------------------------------------------------  Indications: Edema.  Risk Factors: Chemotherapy Cancer Lymphoma. Comparison Study: No previous exams Performing Technologist: Jody Hill RVT, RDMS  Examination Guidelines: A complete evaluation includes B-mode imaging, spectral Doppler, color Doppler, and power Doppler as needed of all accessible portions of each vessel. Bilateral testing is considered an integral part of a  complete examination. Limited examinations for reoccurring indications may be performed as noted. The reflux portion of the exam is performed with the patient in reverse Trendelenburg.  +---------+---------------+---------+-----------+----------+--------------+ RIGHT    CompressibilityPhasicitySpontaneityPropertiesThrombus Aging +---------+---------------+---------+-----------+----------+--------------+ CFV      Full           Yes      Yes                                 +---------+---------------+---------+-----------+----------+--------------+ SFJ      Full                                                        +---------+---------------+---------+-----------+----------+--------------+ FV Prox  Full           Yes      Yes                                 +---------+---------------+---------+-----------+----------+--------------+ FV Mid   Full           Yes      Yes                                 +---------+---------------+---------+-----------+----------+--------------+ FV DistalFull           Yes      Yes                                 +---------+---------------+---------+-----------+----------+--------------+ PFV      Full                                                        +---------+---------------+---------+-----------+----------+--------------+  POP      Full           Yes      Yes                                 +---------+---------------+---------+-----------+----------+--------------+ PTV      None           No       No                   Acute          +---------+---------------+---------+-----------+----------+--------------+ PERO     None           No       No                   Acute          +---------+---------------+---------+-----------+----------+--------------+   +---------+---------------+---------+-----------+----------+--------------+ LEFT     CompressibilityPhasicitySpontaneityPropertiesThrombus Aging  +---------+---------------+---------+-----------+----------+--------------+ CFV      Full           Yes      Yes                                 +---------+---------------+---------+-----------+----------+--------------+ SFJ      Full                                                        +---------+---------------+---------+-----------+----------+--------------+ FV Prox  Full           Yes      Yes                                 +---------+---------------+---------+-----------+----------+--------------+ FV Mid   Full           Yes      Yes                                 +---------+---------------+---------+-----------+----------+--------------+ FV DistalFull           Yes      Yes                                 +---------+---------------+---------+-----------+----------+--------------+ PFV      Full                                                        +---------+---------------+---------+-----------+----------+--------------+ POP      Full           Yes      Yes                                 +---------+---------------+---------+-----------+----------+--------------+ PTV      Full                                                        +---------+---------------+---------+-----------+----------+--------------+  PERO     None           No       No                   Acute          +---------+---------------+---------+-----------+----------+--------------+     Summary: BILATERAL: -No evidence of popliteal cyst, bilaterally. RIGHT: - Findings consistent with acute deep vein thrombosis involving the right peroneal veins, and right posterior tibial veins.  LEFT: - Findings consistent with acute deep vein thrombosis involving the left peroneal veins.  *See table(s) above for measurements and observations. Electronically signed by Coral Else MD on 09/25/2022 at 5:07:37 PM.    Final    IR IVC FILTER PLMT / S&I Lenise Arena GUID/MOD SED  Result Date:  09/25/2022 CLINICAL DATA:  67 year old male with history of diffuse large B-cell lymphoma and lower extremity deep vein thrombosis with contraindication to anticoagulation due to high risk of lymphoma hemorrhagic transformation. EXAM: 1. ULTRASOUND GUIDANCE FOR VASCULAR ACCESS OF THE RIGHT internal jugular VEIN. 2. IVC VENOGRAM. 3. PERCUTANEOUS IVC FILTER PLACEMENT. ANESTHESIA/SEDATION: Moderate (conscious) sedation was employed during this procedure. A total of Versed 1 mg and Fentanyl 50 mcg was administered intravenously. Moderate Sedation Time: 12 minutes. The patient's level of consciousness and vital signs were monitored continuously by radiology nursing throughout the procedure under my direct supervision. CONTRAST:  30mL OMNIPAQUE IOHEXOL 300 MG/ML SOLN, 7mL OMNIPAQUE IOHEXOL 300 MG/ML SOLN FLUOROSCOPY TIME:  One hundred five mGy. PROCEDURE: The procedure, risks, benefits, and alternatives were explained to the patient. Questions regarding the procedure were encouraged and answered. The patient understands and consents to the procedure. The patient was prepped with Betadine in a sterile fashion, and a sterile drape was applied covering the operative field. A sterile gown and sterile gloves were used for the procedure. Local anesthesia was provided with 1% Lidocaine. Under direct ultrasound guidance, a 21 gauge needle was advanced into the right internal jugular vein with ultrasound image documentation performed. After securing access with a micropuncture dilator, a guidewire was advanced into the inferior vena cava. A deployment sheath was advanced over the guidewire. This was utilized to perform IVC venography. The deployment sheath was further positioned in an appropriate location for filter deployment. A Denali IVC filter was then advanced in the sheath. This was then fully deployed in the infrarenal IVC. Final filter position was confirmed with a fluoroscopic spot image. Contrast injection was also  performed through the sheath under fluoroscopy to confirm patency of the IVC at the level of the filter. After the procedure the sheath was removed and hemostasis obtained with manual compression. COMPLICATIONS: None. FINDINGS: IVC venography demonstrates a normal caliber IVC with no evidence of thrombus. Renal veins are identified bilaterally. The IVC filter was successfully positioned below the level of the renal veins and is appropriately oriented. This IVC filter has both permanent and retrievable indications. IMPRESSION: Placement of percutaneous IVC filter in infrarenal IVC. IVC venogram shows no evidence of IVC thrombus and normal caliber of the inferior vena cava. This filter does have both permanent and retrievable indications. PLAN: This IVC filter is potentially retrievable. The patient will be assessed for filter retrieval by Interventional Radiology in approximately 8-12 weeks. Further recommendations regarding filter retrieval, continued surveillance or declaration of device permanence, will be made at that time. Marliss Coots, MD [facility] Electronically Signed   By: Marliss Coots M.D.   On: 09/25/2022 15:20  ECHOCARDIOGRAM LIMITED  Result Date: 09/25/2022    ECHOCARDIOGRAM LIMITED REPORT   Patient Name:   Justin Cameron Date of Exam: 09/25/2022 Medical Rec #:  [id number]           Height:       68.0 in Accession #:    [id number]          Weight:       178.8 lb Date of Birth:  1956/02/13            BSA:          1.949 m Patient Age:    66 years            BP:           94/56 mmHg Patient Gender: M                   HR:           100 bpm. Exam Location:  Inpatient Procedure: Limited Echo, Cardiac Doppler, Color Doppler and Intracardiac            Opacification Agent Indications:    Pulmonary embolis  History:        Patient has prior history of Echocardiogram examinations, most                 recent 09/20/2022.  Sonographer:    Lucy Antigua Referring Phys: [id number] Adam Phenix IMPRESSIONS  1. Left ventricular ejection fraction, by estimation, is 45 to 50%. The left ventricle has mildly decreased function. There is the interventricular septum is flattened in systole, consistent with right ventricular pressure overload.  2. Right ventricular systolic function is mildly reduced. The right ventricular size is mildly enlarged.  3. Mild mitral valve regurgitation.  4. The aortic valve is tricuspid. Aortic valve sclerosis is present, with no evidence of aortic valve stenosis. Comparison(s): Prior images reviewed side by side. Compared to recent prior study, LVEF is mildly decreased, with evidence of new RV strain. This study uses echo contrast. FINDINGS  Left Ventricle: Left ventricular ejection fraction, by estimation, is 45 to 50%. The left ventricle has mildly decreased function. Definity contrast agent was given IV to delineate the left ventricular endocardial borders. The interventricular septum is  flattened in systole, consistent with right ventricular pressure overload. Right Ventricle: The right ventricular size is mildly enlarged. Right ventricular systolic function is mildly reduced. Mitral Valve: Mild mitral valve regurgitation. Tricuspid Valve: Tricuspid valve regurgitation is mild. Aortic Valve: The aortic valve is tricuspid. Aortic valve sclerosis is present, with no evidence of aortic valve stenosis. Pulmonic Valve: The pulmonic valve was normal in structure. Pulmonic valve regurgitation is trivial. No evidence of pulmonic stenosis. Additional Comments: Spectral Doppler performed. Color Doppler performed.   LV Volumes (MOD) LV vol d, MOD A2C: 77.5 ml LV vol d, MOD A4C: 70.7 ml LV vol s, MOD A2C: 44.8 ml LV vol s, MOD A4C: 37.7 ml LV SV MOD A2C:     32.7 ml LV SV MOD A4C:     70.7 ml LV SV MOD BP:      35.1 ml Riley Lam MD Electronically signed by Riley Lam MD Signature Date/Time: 09/25/2022/1:37:02 PM    Final     [place] EKG SITE RITE  Result Date: 09/25/2022 If Site Rite image not attached, placement could not be confirmed due to current cardiac rhythm.  DG Chest 1 View  Result Date: 09/15/2022 CLINICAL DATA:  Check endotracheal tube placement EXAM: PORTABLE  CHEST 1 VIEW COMPARISON:  Film from earlier in the same day. FINDINGS: Gastric catheter is noted within the stomach. Endotracheal tube is noted in satisfactory position. Right chest wall port is seen and stable. Lungs are well aerated bilaterally. Minimal right effusion is seen. No acute bony abnormality is noted. IMPRESSION: Minimal right-sided effusion. Electronically Signed   By: Alcide Clever M.D.   On: 09/23/2022 20:25   DG CHEST PORT 1 VIEW  Result Date: 09/23/2022 CLINICAL DATA:  Evaluate gastric catheter placement EXAM: PORTABLE CHEST 1 VIEW COMPARISON:  09/18/2022 FINDINGS: Cardiac shadow is stable but accentuated by the portable technique. The overall inspiratory effort is poor with right basilar atelectasis. Right chest wall port is seen in satisfactory position. No pneumothorax is noted. Gastric catheter is noted with the tip superimposed over the left mainstem bronchus. This may still lie within the mid esophagus. This should be withdrawn and readvanced. IMPRESSION: Gastric catheter as described. This should be withdrawn and readvanced into the stomach. Electronically Signed   By: Alcide Clever M.D.   On: 09/06/2022 15:09   DG Abd Portable 1V  Result Date: 09/07/2022 CLINICAL DATA:  Check gastric catheter placement EXAM: PORTABLE ABDOMEN - 1 VIEW COMPARISON:  Film from earlier in the same day. FINDINGS: Scattered large and small bowel gas is noted. Retained fecal material within the colon is seen. No free air is noted. Gastric catheter is not visualized. Chest x-ray may be helpful for further evaluation. IMPRESSION: Gastric catheter is not visualized on this exam. Electronically Signed   By: Alcide Clever M.D.   On: 09/23/2022 15:07   DG Abd 1  View  Result Date: 09/23/2022 CLINICAL DATA:  Nasogastric placement EXAM: ABDOMEN - 1 VIEW COMPARISON:  Earlier same day FINDINGS: Nasogastric tube enters the stomach and coils in the fundus. Less intestinal gas than was noted on the prior image. Lucency in the right upper quadrant was not seen on the previous images. Free intraperitoneal air is not excluded. If there is clinical concern regarding this possibility, either left-side-down decubitus imaging or CT abdomen would be suggested. IMPRESSION: 1. Nasogastric tube enters the stomach and coils in the fundus. 2. Less intestinal gas than was noted on the prior image. 3. Lucency in the right upper quadrant was not seen on the previous images. Free intraperitoneal air is not excluded. See above discussion. Electronically Signed   By: Paulina Fusi M.D.   On: 09/25/2022 12:14   DG Abd Portable 1V  Result Date: 09/23/2022 CLINICAL DATA:  Abdominal pain. EXAM: PORTABLE ABDOMEN - 1 VIEW COMPARISON:  None Available. FINDINGS: Mildly dilated air-filled small bowel loops are noted concerning for ileus or possibly distal small bowel obstruction. No colonic dilatation is noted. No abnormal calcifications are noted. IMPRESSION: Mildly dilated air-filled small bowel loops are noted concerning for ileus or possibly distal small bowel obstruction. Electronically Signed   By: Lupita Raider M.D.   On: 09/08/2022 09:05   IR IMAGING GUIDED PORT INSERTION  Result Date: 09/20/2022 INDICATION: Colonic mass.  New diagnosis of lymphoma. EXAM: IMPLANTED PORT A CATH PLACEMENT WITH ULTRASOUND AND FLUOROSCOPIC GUIDANCE MEDICATIONS: Ancef 2 gm IV; The antibiotic was administered within an appropriate time interval prior to skin puncture. ANESTHESIA/SEDATION: Moderate (conscious) sedation was employed during this procedure. A total of Versed 2 mg and Fentanyl 100 mcg was administered intravenously. Moderate Sedation Time: 20 minutes. The patient's level of consciousness and vital  signs were monitored continuously by radiology nursing throughout the procedure under my direct  supervision. FLUOROSCOPY TIME:  Fluoroscopic dose; 0 mGy COMPLICATIONS: None immediate. PROCEDURE: The procedure, risks, benefits, and alternatives were explained to the patient. Questions regarding the procedure were encouraged and answered. The patient understands and consents to the procedure. The RIGHT neck and chest were prepped with chlorhexidine in a sterile fashion, and a sterile drape was applied covering the operative field. Maximum barrier sterile technique with sterile gowns and gloves were used for the procedure. A timeout was performed prior to the initiation of the procedure. Local anesthesia was provided with 1% lidocaine with epinephrine. After creating a small venotomy incision, a micropuncture kit was utilized to access the internal jugular vein under direct, real-time ultrasound guidance. Ultrasound image documentation was performed. The microwire was kinked to measure appropriate catheter length. A subcutaneous port pocket was then created along the upper chest wall utilizing a combination of sharp and blunt dissection. The pocket was irrigated with sterile saline. A single lumen Non-ISP power injectable port was chosen for placement. The 8 Fr catheter was tunneled from the port pocket site to the venotomy incision. The port was placed in the pocket. The external catheter was trimmed to appropriate length. At the venotomy, an 8 Fr peel-away sheath was placed over a guidewire under fluoroscopic guidance. The catheter was then placed through the sheath and the sheath was removed. Final catheter positioning was confirmed and documented with a fluoroscopic spot radiograph. The port was accessed with a Huber needle, aspirated and flushed with heparinized saline. The port pocket incision was closed with interrupted 3-0 Vicryl suture then Dermabond was applied, including at the venotomy incision. Dressings  were placed. The patient tolerated the procedure well without immediate post procedural complication. IMPRESSION: Successful placement of a RIGHT internal jugular approach power injectable Port-A-Cath. The tip of the catheter is positioned within the proximal RIGHT atrium. The catheter is ready for immediate use. Roanna Banning, MD [facility] Electronically Signed   By: Roanna Banning M.D.   On: 09/20/2022 14:44   ECHOCARDIOGRAM COMPLETE  Result Date: 09/20/2022    ECHOCARDIOGRAM REPORT   Patient Name:   Justin Cameron Date of Exam: 09/20/2022 Medical Rec #:  [id number]           Height:       68.0 in Accession #:    [id number]          Weight:       177.9 lb Date of Birth:  [birth year]            BSA:          1.945 m Patient Age:    66 years            BP:           100/63 mmHg Patient Gender: M                   HR:           80 bpm. Exam Location:  Inpatient Procedure: 2D Echo, Cardiac Doppler and Color Doppler Indications:    Chemo  History:        Patient has no prior history of Echocardiogram examinations.  Sonographer:    Lucy Antigua Referring Phys: [id number] [name]  Sonographer Comments: Strain was attempted. Echo machine @ [facility] doesn't have strain Neurosurgeon. IMPRESSIONS  1. Left ventricular ejection fraction, by estimation, is 55%. The left ventricle has low normal function. The left ventricle has no regional wall motion abnormalities.  2. Right ventricular systolic function is normal. The right ventricular size is normal. There is normal pulmonary artery systolic pressure.  3. The mitral valve is normal in structure. Trivial mitral valve regurgitation. No evidence of mitral stenosis.  4. The aortic valve is normal in structure. Aortic valve regurgitation is not visualized. No aortic stenosis is present.  5. The inferior vena cava is normal in size with greater than 50% respiratory variability, suggesting right atrial pressure of 3  mmHg. FINDINGS  Left Ventricle: Left ventricular ejection fraction, by estimation, is 50 to 55%. The left ventricle has low normal function. The left ventricle has no regional wall motion abnormalities. The left ventricular internal cavity size was normal in size. There is borderline left ventricular hypertrophy. Left ventricular diastolic parameters were normal. Right Ventricle: The right ventricular size is normal. No increase in right ventricular wall thickness. Right ventricular systolic function is normal. There is normal pulmonary artery systolic pressure. The tricuspid regurgitant velocity is 1.94 m/s, and  with an assumed right atrial pressure of 3 mmHg, the estimated right ventricular systolic pressure is 18.1 mmHg. Left Atrium: Left atrial size was normal in size. Right Atrium: Right atrial size was normal in size. Pericardium: There is no evidence of pericardial effusion. Mitral Valve: The mitral valve is normal in structure. Trivial mitral valve regurgitation. No evidence of mitral valve stenosis. Tricuspid Valve: The tricuspid valve is normal in structure. Tricuspid valve regurgitation is trivial. No evidence of tricuspid stenosis. Aortic Valve: The aortic valve is normal in structure. Aortic valve regurgitation is not visualized. No aortic stenosis is present. Aortic valve mean gradient measures 2.0 mmHg. Aortic valve peak gradient measures 3.6 mmHg. Aortic valve area, by VTI measures 3.80 cm. Pulmonic Valve: The pulmonic valve was normal in structure. Pulmonic valve regurgitation is not visualized. No evidence of pulmonic stenosis. Aorta: The aortic root is normal in size and structure. Venous: The inferior vena cava is normal in size with greater than 50% respiratory variability, suggesting right atrial pressure of 3 mmHg. IAS/Shunts: No atrial level shunt detected by color flow Doppler.  LEFT VENTRICLE PLAX 2D LVIDd:         3.60 cm   Diastology LVIDs:         2.70 cm   LV e' medial:    10.00 cm/s  LV PW:         1.20 cm   LV E/e' medial:  4.6 LV IVS:        1.00 cm   LV e' lateral:   9.36 cm/s LVOT diam:     2.20 cm   LV E/e' lateral: 4.9 LV SV:         74 LV SV Index:   38 LVOT Area:     3.80 cm  RIGHT VENTRICLE RV S prime:     10.90 cm/s TAPSE (M-mode): 1.5 cm LEFT ATRIUM             Index        RIGHT ATRIUM           Index LA Vol (A2C):   61.4 ml 31.58 ml/m  RA Area:     13.90 cm LA Vol (A4C):   58.4 ml 30.03 ml/m  RA Volume:   32.80 ml  16.87 ml/m LA Biplane Vol: 59.6 ml 30.65 ml/m  AORTIC VALVE AV Area (Vmax):    3.46 cm AV Area (Vmean):   3.17 cm AV Area (VTI):     3.80 cm AV Vmax:  94.70 cm/s AV Vmean:          64.800 cm/s AV VTI:            0.195 m AV Peak Grad:      3.6 mmHg AV Mean Grad:      2.0 mmHg LVOT Vmax:         86.20 cm/s LVOT Vmean:        54.000 cm/s LVOT VTI:          0.195 m LVOT/AV VTI ratio: 1.00  AORTA Ao Root diam: 2.90 cm Ao Asc diam:  3.60 cm MITRAL VALVE               TRICUSPID VALVE MV Area (PHT): 3.08 cm    TR Peak grad:   15.1 mmHg MV Decel Time: 246 msec    TR Vmax:        194.00 cm/s MR Peak grad: 32.5 mmHg MR Vmax:      285.00 cm/s  SHUNTS MV E velocity: 46.30 cm/s  Systemic VTI:  0.20 m MV A velocity: 61.30 cm/s  Systemic Diam: 2.20 cm MV E/A ratio:  0.76 Aditya Sabharwal Electronically signed by Dorthula Nettles Signature Date/Time: 09/20/2022/1:15:18 PM    Final    CT ABDOMEN PELVIS W CONTRAST  Result Date: 09/18/2022 CLINICAL DATA:  Colon cancer, anorexia, weakness, fever EXAM: CT ABDOMEN AND PELVIS WITH CONTRAST TECHNIQUE: Multidetector CT imaging of the abdomen and pelvis was performed using the standard protocol following bolus administration of intravenous contrast. RADIATION DOSE REDUCTION: This exam was performed according to the departmental dose-optimization program which includes automated exposure control, adjustment of the mA and/or kV according to patient size and/or use of iterative reconstruction technique. CONTRAST:  OMNIPAQUE  IOHEXOL 300 MG/ML  SOLN COMPARISON:  08/12/2022 FINDINGS: Lower chest: There are 2 left lower lobe pulmonary nodules consistent with progressive pulmonary metastases. 1.4 cm nodule image 12/2 and a 0.8 cm nodule image 15/2. No airspace disease or effusion. Hepatobiliary: Gallbladder is moderately distended without cholelithiasis or cholecystitis. Stable cysts are seen within the liver. Since the prior exam, there is loss of normal fat plane between the inferior right lobe liver and and enlarging colonic mass at the hepatic flexure. Direct invasion of the liver cannot be excluded. Pancreas: Unremarkable. No pancreatic ductal dilatation or surrounding inflammatory changes. Spleen: Normal in size without focal abnormality. Adrenals/Urinary Tract: Bilateral adrenal masses seen previously have enlarged, consistent with metastatic disease. Right adrenal mass measures up to 5.1 cm, previously 3.5 cm. The left adrenal mass measures up to 4.7 cm, previously 3.5 cm. The kidneys enhance normally and symmetrically. No urinary tract calculi or obstructive uropathy. Bladder is unremarkable. Stomach/Bowel: A large necrotic mass at the hepatic flexure of the colon has increased in size, measuring up to 12.5 x 10.1 cm. There is increasing pericolonic fat stranding and mesenteric nodularity consistent with locally invasive disease. As discussed above, there is loss of normal fat plane between the hepatic mass and the inferior margin right lobe liver and direct invasion of the liver cannot be excluded. There is no evidence of bowel obstruction or ileus. Normal appendix right lower quadrant. Vascular/Lymphatic: Increase in the size and number of mesenteric lymph nodes surrounding the hepatic flexure mass, consistent with local nodal metastases. Largest lymph node measures 0.8 cm reference image 56/2. There is also a soft tissue mass contiguous with the left crus of the diaphragm, which appears separate from the enlarging left adrenal  mass. This measures 3.5 x 3.0  cm, new since prior study and consistent with metastatic deposit. Stable atherosclerosis of the aorta and its branches. Reproductive: Prostate is unremarkable. Other: Trace pelvic free fluid. No free intraperitoneal gas. No abdominal wall hernia. Musculoskeletal: There are no acute or destructive bony abnormalities. Reconstructed images demonstrate no additional findings. IMPRESSION: 1. Enlarging mass at the hepatic flexure of the colon consistent with progressive colonic malignancy. There is increasing pericolonic fat stranding with loss of fat plane between the mass and the right lobe liver concerning for direct hepatic involvement. 2. Increasing mesenteric adenopathy, new soft tissue mass along the crus of the left hemidiaphragm, and enlarging bilateral adrenal masses consistent with progressive intra-abdominal metastases. 3. New and enlarging left lower lobe pulmonary nodules consistent with progressive pulmonary metastatic disease. 4. Trace pelvic free fluid. 5.  Aortic Atherosclerosis (ICD10-I70.0). Electronically Signed   By: Sharlet Salina M.D.   On: 09/18/2022 18:46   DG Chest Port 1 View  Result Date: 09/18/2022 CLINICAL DATA:  Fever and vomiting EXAM: PORTABLE CHEST 1 VIEW COMPARISON:  Radiograph 09/04/2016 and CT chest 08/22/2022 FINDINGS: Stable cardiomediastinal silhouette. Aortic atherosclerotic calcification. No focal consolidation, pleural effusion, or pneumothorax. No displaced rib fractures. IMPRESSION: No active disease. Electronically Signed   By: Minerva Fester M.D.   On: 09/18/2022 17:37    Microbiology: Recent Results (from the past 240 hour(s))  Culture, blood (Routine X 2) w Reflex to ID Panel     Status: None (Preliminary result)   Collection Time: 10/18/2022  8:03 AM   Specimen: BLOOD  Result Value Ref Range Status   Specimen Description   Final    BLOOD BLOOD LEFT ARM Performed at [facility], [address]  [place], [place] [address]    Special Requests   Final    BOTTLES DRAWN AEROBIC AND ANAEROBIC Blood Culture adequate volume Performed at [facility], [address], [place], [place] [address]    Culture   Final    NO GROWTH < 24 HOURS Performed at [facility], [address] [place], [place] [address]    Report Status PENDING  Incomplete  Culture, blood (Routine X 2) w Reflex to ID Panel     Status: None (Preliminary result)   Collection Time: 10/03/2022  8:03 AM   Specimen: BLOOD LEFT HAND  Result Value Ref Range Status   Specimen Description   Final    BLOOD LEFT HAND Performed at [facility], [address] [place], [place] [address]    Special Requests   Final    BOTTLES DRAWN AEROBIC ONLY Blood Culture adequate volume Performed at [facility], [address], [place], [place] [address]    Culture   Final    NO GROWTH < 24 HOURS Performed at [facility], [address], [place], [place] [address]    Report Status PENDING  Incomplete    Time spent:35 minutes  Signed: Marinda Elk, MD 10/24/2022

## 2022-10-28 DEATH — deceased

## 2023-09-01 IMAGING — CR DG HIP (WITH OR WITHOUT PELVIS) 2-3V*L*
3 series · 3 of 3 positions shown · non-contrast
Comparison: None Available.

CLINICAL DATA: Left hip pain with decreased range of motion.

EXAM:
DG HIP (WITH OR WITHOUT PELVIS) 2-3V LEFT

[w pelvis upright]
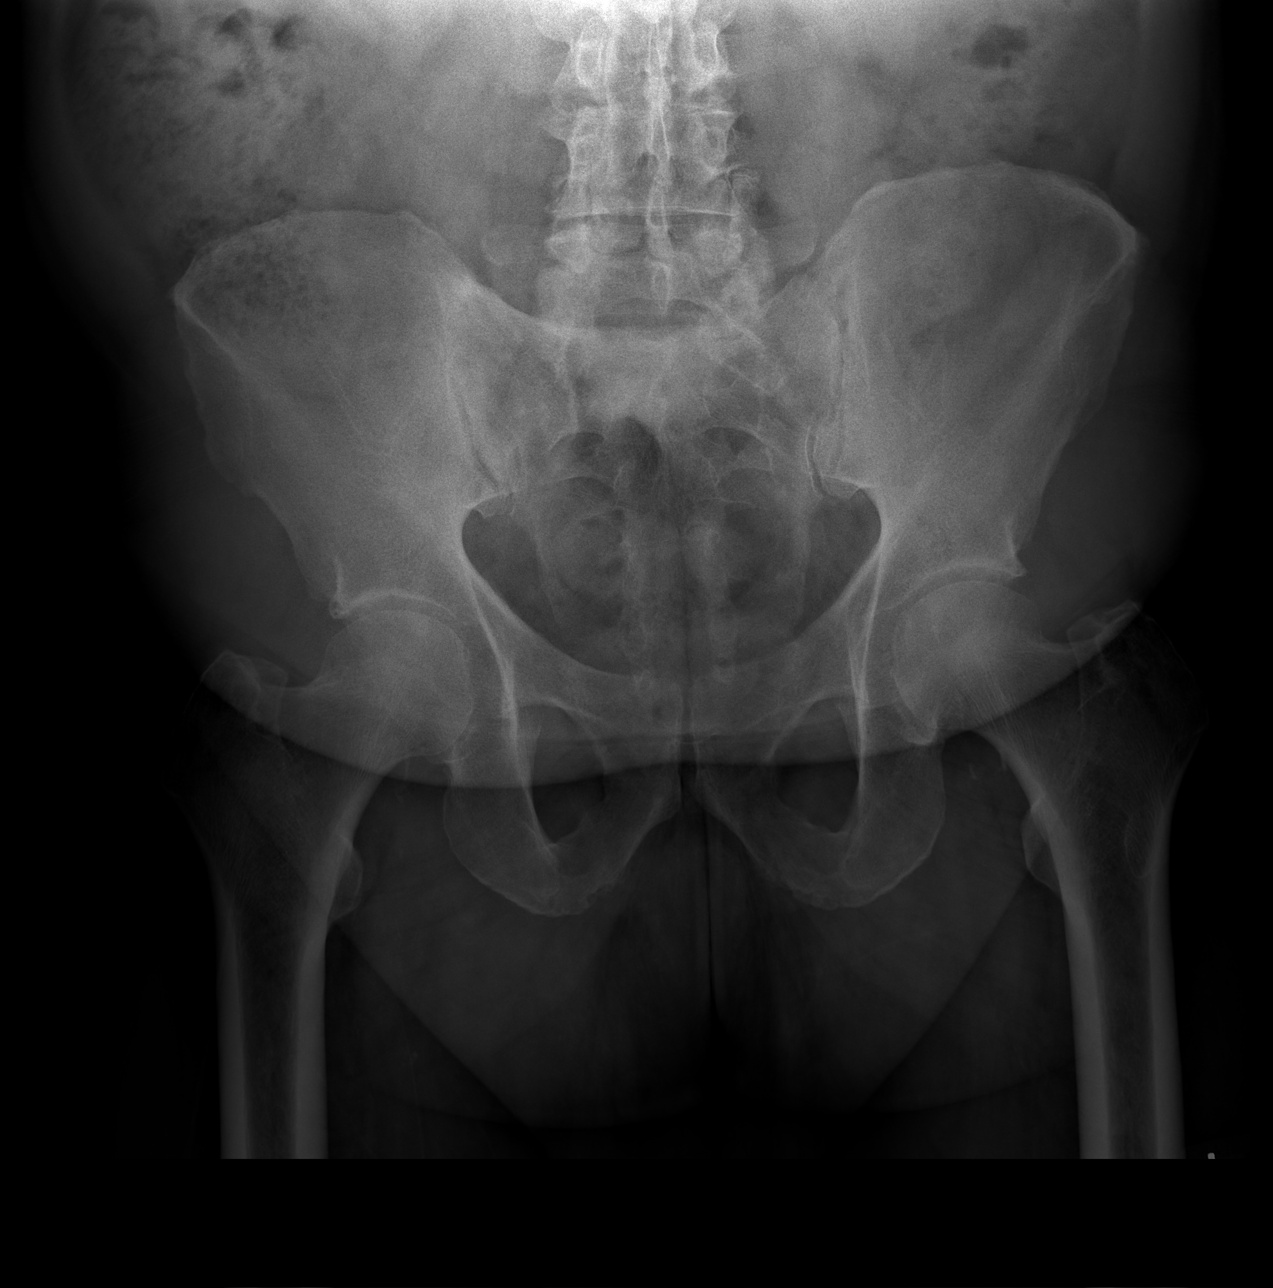

[w hip ap left]
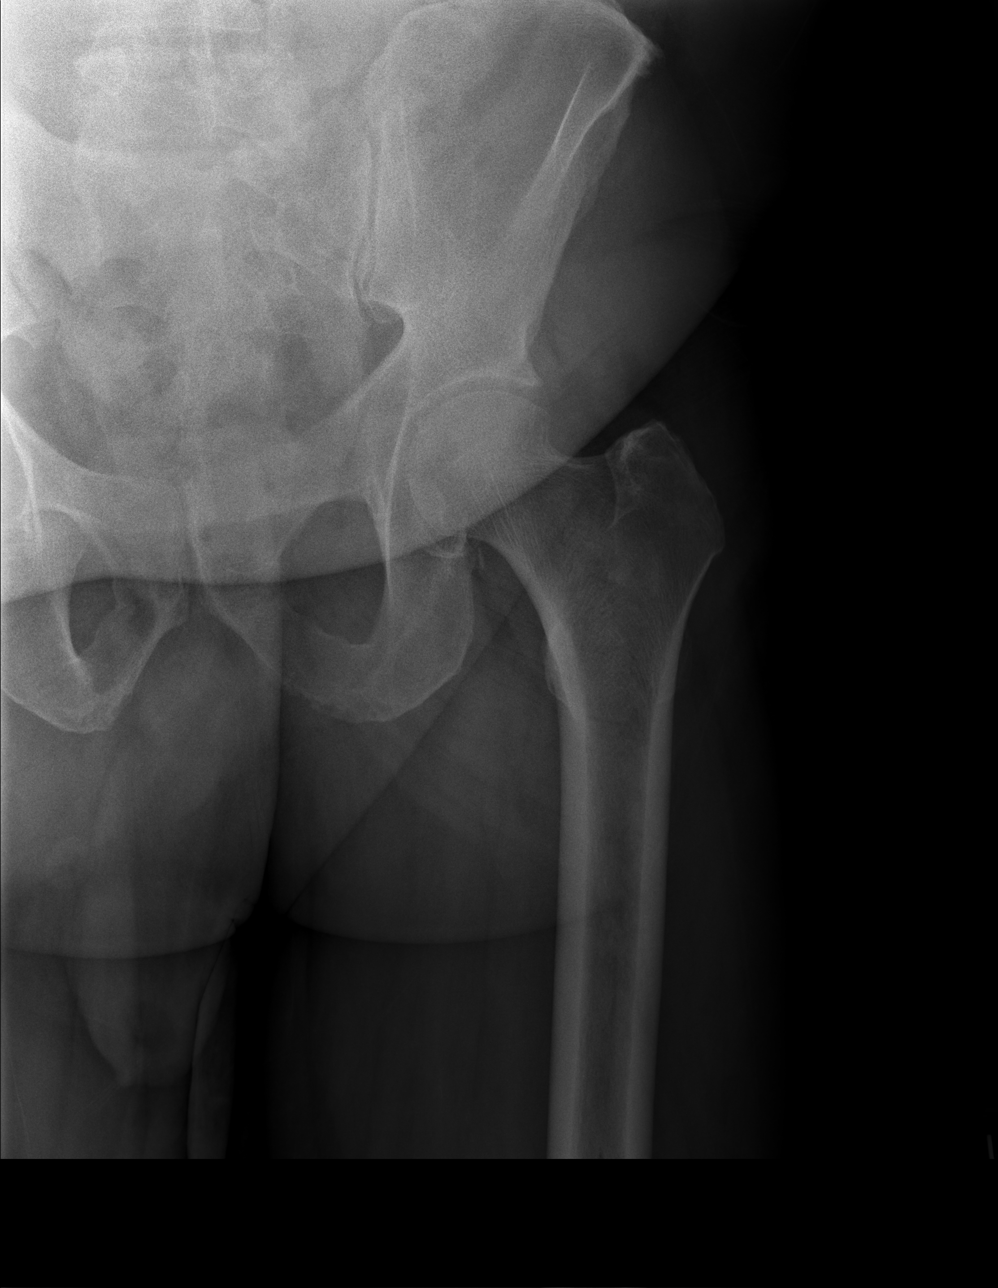

[w hip lat left]
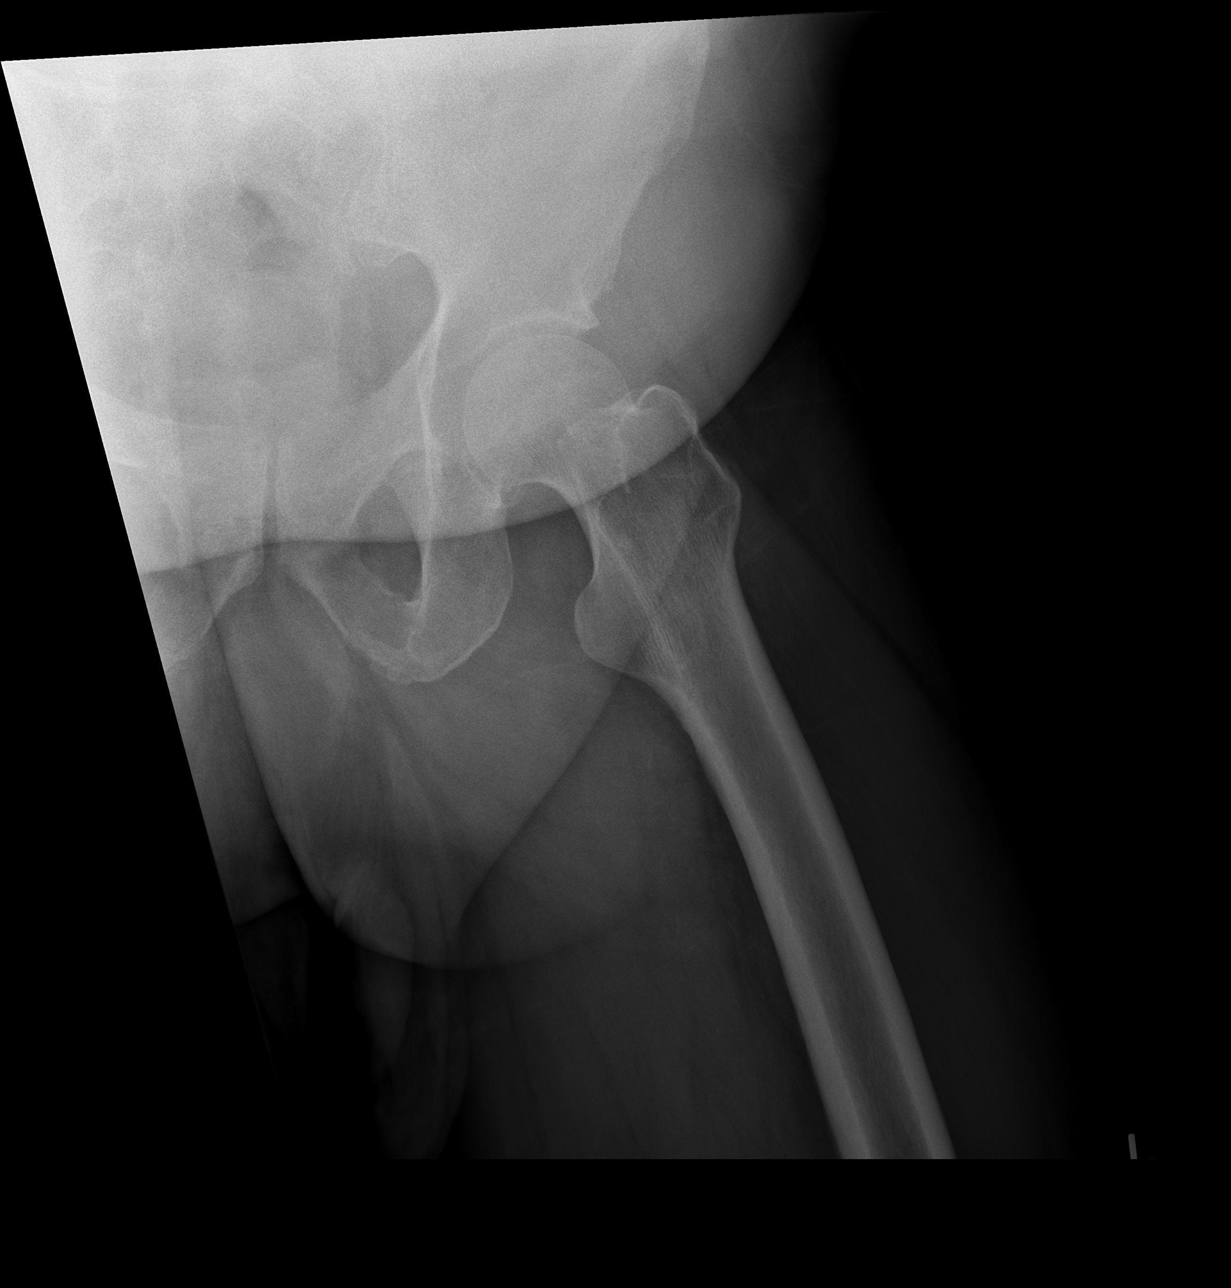

[3 of 3 positions shown; findings below may reference images not displayed]

FINDINGS: There is no evidence of hip fracture or dislocation. Minimal
osteophytosis are identified in the superolateral bilateral
acetabulum.
IMPRESSION: No acute fracture or dislocation. Minimal degenerative joint changes
of hips
